# Patient Record
Sex: Female | Born: 1950 | Race: White | Hispanic: No | Smoking: Former smoker
Health system: Southern US, Community
[De-identification: ages and names within clinical notes are randomized; demographics above are authoritative.]

## PROBLEM LIST (undated history)

## (undated) DIAGNOSIS — M1990 Unspecified osteoarthritis, unspecified site: Secondary | ICD-10-CM

## (undated) DIAGNOSIS — I639 Cerebral infarction, unspecified: Secondary | ICD-10-CM

## (undated) DIAGNOSIS — J449 Chronic obstructive pulmonary disease, unspecified: Secondary | ICD-10-CM

## (undated) DIAGNOSIS — N189 Chronic kidney disease, unspecified: Secondary | ICD-10-CM

## (undated) DIAGNOSIS — G8929 Other chronic pain: Secondary | ICD-10-CM

## (undated) DIAGNOSIS — R011 Cardiac murmur, unspecified: Secondary | ICD-10-CM

## (undated) DIAGNOSIS — J189 Pneumonia, unspecified organism: Secondary | ICD-10-CM

## (undated) DIAGNOSIS — J45909 Unspecified asthma, uncomplicated: Secondary | ICD-10-CM

## (undated) DIAGNOSIS — I10 Essential (primary) hypertension: Secondary | ICD-10-CM

## (undated) DIAGNOSIS — F32A Depression, unspecified: Secondary | ICD-10-CM

## (undated) DIAGNOSIS — E079 Disorder of thyroid, unspecified: Secondary | ICD-10-CM

## (undated) DIAGNOSIS — D649 Anemia, unspecified: Secondary | ICD-10-CM

## (undated) DIAGNOSIS — G709 Myoneural disorder, unspecified: Secondary | ICD-10-CM

## (undated) DIAGNOSIS — E039 Hypothyroidism, unspecified: Secondary | ICD-10-CM

## (undated) DIAGNOSIS — M549 Dorsalgia, unspecified: Secondary | ICD-10-CM

## (undated) DIAGNOSIS — K219 Gastro-esophageal reflux disease without esophagitis: Secondary | ICD-10-CM

## (undated) HISTORY — PX: NECK SURGERY: SHX720

## (undated) HISTORY — PX: EYE SURGERY: SHX253

## (undated) HISTORY — PX: WISDOM TOOTH EXTRACTION: SHX21

## (undated) HISTORY — PX: SPINE SURGERY: SHX786

## (undated) HISTORY — PX: COLONOSCOPY: SHX174

## (undated) HISTORY — PX: ABDOMINAL HYSTERECTOMY: SHX81

## (undated) NOTE — *Deleted (*Deleted)
Recreational Therapy Discharge Summary Patient Details  Name: Jean Shaffer MRN: [id number] Date of Birth: [birth year] Today's Date: 10/29/2020  Long term goals set: 1  Long term goals met: 0  Comments on progress toward goals: Pt has made great progress during LOS.  Pt is scheduled for discharge home on 11/9 with family.  TR sessions focused on activity analysis/modifications, coping strategies and community reintegration.  Pt performed community mobility using rollator with supervision on level surfaces, however when on uneven surfaces and navigating over obstacle, pt required contact guard assist for safety.  Goal partially met. Reasons for discharge: discharge from hospital  Patient/family agrees with progress made and goals achieved: Yes  [name] 10/29/2020, 12:32 PM

---

## 1998-07-25 ENCOUNTER — Emergency Department (HOSPITAL_COMMUNITY): Admission: EM | Admit: 1998-07-25 | Discharge: 1998-07-25 | Payer: Self-pay | Admitting: *Deleted

## 1999-01-17 ENCOUNTER — Encounter: Payer: Self-pay | Admitting: Emergency Medicine

## 1999-01-17 ENCOUNTER — Emergency Department (HOSPITAL_COMMUNITY): Admission: EM | Admit: 1999-01-17 | Discharge: 1999-01-17 | Payer: Self-pay | Admitting: Emergency Medicine

## 1999-05-16 ENCOUNTER — Encounter: Admission: RE | Admit: 1999-05-16 | Discharge: 1999-05-16 | Payer: Self-pay | Admitting: Family Medicine

## 1999-06-22 ENCOUNTER — Encounter: Admission: RE | Admit: 1999-06-22 | Discharge: 1999-06-22 | Payer: Self-pay | Admitting: Sports Medicine

## 1999-07-07 ENCOUNTER — Encounter: Admission: RE | Admit: 1999-07-07 | Discharge: 1999-07-07 | Payer: Self-pay | Admitting: Family Medicine

## 1999-07-25 ENCOUNTER — Encounter: Admission: RE | Admit: 1999-07-25 | Discharge: 1999-07-25 | Payer: Self-pay | Admitting: Family Medicine

## 1999-08-22 ENCOUNTER — Encounter: Admission: RE | Admit: 1999-08-22 | Discharge: 1999-08-22 | Payer: Self-pay | Admitting: Family Medicine

## 1999-09-06 ENCOUNTER — Emergency Department (HOSPITAL_COMMUNITY): Admission: EM | Admit: 1999-09-06 | Discharge: 1999-09-06 | Payer: Self-pay | Admitting: Emergency Medicine

## 1999-09-06 ENCOUNTER — Emergency Department (HOSPITAL_COMMUNITY): Admission: EM | Admit: 1999-09-06 | Discharge: 1999-09-06 | Payer: Self-pay | Admitting: Internal Medicine

## 1999-09-07 ENCOUNTER — Encounter: Admission: RE | Admit: 1999-09-07 | Discharge: 1999-09-07 | Payer: Self-pay | Admitting: Family Medicine

## 1999-12-26 HISTORY — PX: NECK SURGERY: SHX720

## 2000-01-18 ENCOUNTER — Encounter: Admission: RE | Admit: 2000-01-18 | Discharge: 2000-01-18 | Payer: Self-pay | Admitting: Family Medicine

## 2000-02-03 ENCOUNTER — Encounter: Admission: RE | Admit: 2000-02-03 | Discharge: 2000-02-03 | Payer: Self-pay | Admitting: Family Medicine

## 2000-02-20 ENCOUNTER — Emergency Department (HOSPITAL_COMMUNITY): Admission: EM | Admit: 2000-02-20 | Discharge: 2000-02-20 | Payer: Self-pay | Admitting: Emergency Medicine

## 2000-02-22 ENCOUNTER — Encounter: Admission: RE | Admit: 2000-02-22 | Discharge: 2000-02-22 | Payer: Self-pay | Admitting: Sports Medicine

## 2000-02-22 ENCOUNTER — Encounter: Payer: Self-pay | Admitting: Sports Medicine

## 2000-02-22 ENCOUNTER — Encounter: Admission: RE | Admit: 2000-02-22 | Discharge: 2000-02-22 | Payer: Self-pay | Admitting: Family Medicine

## 2000-02-23 ENCOUNTER — Encounter: Admission: RE | Admit: 2000-02-23 | Discharge: 2000-02-23 | Payer: Self-pay | Admitting: Family Medicine

## 2000-05-14 ENCOUNTER — Emergency Department (HOSPITAL_COMMUNITY): Admission: EM | Admit: 2000-05-14 | Discharge: 2000-05-14 | Payer: Self-pay | Admitting: Emergency Medicine

## 2000-05-14 ENCOUNTER — Encounter: Payer: Self-pay | Admitting: Emergency Medicine

## 2000-06-21 ENCOUNTER — Emergency Department (HOSPITAL_COMMUNITY): Admission: EM | Admit: 2000-06-21 | Discharge: 2000-06-21 | Payer: Self-pay | Admitting: Emergency Medicine

## 2000-06-29 ENCOUNTER — Encounter: Payer: Self-pay | Admitting: Emergency Medicine

## 2000-06-29 ENCOUNTER — Ambulatory Visit (HOSPITAL_COMMUNITY): Admission: RE | Admit: 2000-06-29 | Discharge: 2000-06-29 | Payer: Self-pay | Admitting: Emergency Medicine

## 2000-08-14 ENCOUNTER — Encounter: Payer: Self-pay | Admitting: Neurosurgery

## 2000-08-17 ENCOUNTER — Inpatient Hospital Stay (HOSPITAL_COMMUNITY): Admission: RE | Admit: 2000-08-17 | Discharge: 2000-08-18 | Payer: Self-pay | Admitting: Neurosurgery

## 2000-08-17 ENCOUNTER — Encounter: Payer: Self-pay | Admitting: Neurosurgery

## 2000-09-07 ENCOUNTER — Encounter: Payer: Self-pay | Admitting: Neurosurgery

## 2000-09-07 ENCOUNTER — Encounter: Admission: RE | Admit: 2000-09-07 | Discharge: 2000-09-07 | Payer: Self-pay | Admitting: Neurosurgery

## 2000-10-10 ENCOUNTER — Encounter: Admission: RE | Admit: 2000-10-10 | Discharge: 2000-10-10 | Payer: Self-pay | Admitting: Neurosurgery

## 2000-10-10 ENCOUNTER — Encounter: Payer: Self-pay | Admitting: Neurosurgery

## 2000-12-25 HISTORY — PX: MULTIPLE TOOTH EXTRACTIONS: SHX2053

## 2001-01-22 ENCOUNTER — Encounter: Payer: Self-pay | Admitting: Neurosurgery

## 2001-01-22 ENCOUNTER — Encounter: Admission: RE | Admit: 2001-01-22 | Discharge: 2001-01-22 | Payer: Self-pay | Admitting: Neurosurgery

## 2001-06-07 ENCOUNTER — Emergency Department (HOSPITAL_COMMUNITY): Admission: EM | Admit: 2001-06-07 | Discharge: 2001-06-07 | Payer: Self-pay | Admitting: Emergency Medicine

## 2002-05-08 ENCOUNTER — Encounter: Payer: Self-pay | Admitting: Emergency Medicine

## 2002-05-08 ENCOUNTER — Inpatient Hospital Stay (HOSPITAL_COMMUNITY): Admission: EM | Admit: 2002-05-08 | Discharge: 2002-05-12 | Payer: Self-pay | Admitting: Emergency Medicine

## 2004-02-27 ENCOUNTER — Emergency Department (HOSPITAL_COMMUNITY): Admission: EM | Admit: 2004-02-27 | Discharge: 2004-02-27 | Payer: Self-pay | Admitting: Emergency Medicine

## 2005-06-06 ENCOUNTER — Emergency Department (HOSPITAL_COMMUNITY): Admission: EM | Admit: 2005-06-06 | Discharge: 2005-06-06 | Payer: Self-pay | Admitting: Emergency Medicine

## 2005-06-09 ENCOUNTER — Encounter: Admission: RE | Admit: 2005-06-09 | Discharge: 2005-06-09 | Payer: Self-pay | Admitting: Family Medicine

## 2007-04-22 ENCOUNTER — Encounter: Payer: Self-pay | Admitting: Physical Medicine and Rehabilitation

## 2007-04-25 ENCOUNTER — Encounter: Payer: Self-pay | Admitting: Physical Medicine and Rehabilitation

## 2007-05-26 ENCOUNTER — Encounter: Payer: Self-pay | Admitting: Physical Medicine and Rehabilitation

## 2007-06-12 ENCOUNTER — Ambulatory Visit: Payer: Self-pay | Admitting: Emergency Medicine

## 2007-07-02 ENCOUNTER — Encounter
Admission: RE | Admit: 2007-07-02 | Discharge: 2007-07-02 | Payer: Self-pay | Admitting: Physical Medicine and Rehabilitation

## 2007-11-13 ENCOUNTER — Inpatient Hospital Stay (HOSPITAL_COMMUNITY): Admission: RE | Admit: 2007-11-13 | Discharge: 2007-11-18 | Payer: Self-pay | Admitting: Neurosurgery

## 2007-11-13 HISTORY — PX: SPINE SURGERY: SHX786

## 2007-11-19 ENCOUNTER — Emergency Department: Payer: Self-pay | Admitting: Emergency Medicine

## 2008-03-09 ENCOUNTER — Encounter: Admission: RE | Admit: 2008-03-09 | Discharge: 2008-03-09 | Payer: Self-pay | Admitting: Neurosurgery

## 2008-09-28 ENCOUNTER — Encounter: Admission: RE | Admit: 2008-09-28 | Discharge: 2008-09-28 | Payer: Self-pay | Admitting: Neurosurgery

## 2010-05-08 ENCOUNTER — Emergency Department: Payer: Self-pay | Admitting: Unknown Physician Specialty

## 2010-08-23 ENCOUNTER — Ambulatory Visit: Payer: Self-pay | Admitting: Cardiology

## 2010-08-23 ENCOUNTER — Observation Stay (HOSPITAL_COMMUNITY): Admission: EM | Admit: 2010-08-23 | Discharge: 2010-08-23 | Payer: Self-pay | Admitting: Emergency Medicine

## 2010-11-08 DIAGNOSIS — M1990 Unspecified osteoarthritis, unspecified site: Secondary | ICD-10-CM | POA: Insufficient documentation

## 2010-11-09 DIAGNOSIS — G609 Hereditary and idiopathic neuropathy, unspecified: Secondary | ICD-10-CM | POA: Insufficient documentation

## 2010-11-09 DIAGNOSIS — F331 Major depressive disorder, recurrent, moderate: Secondary | ICD-10-CM | POA: Insufficient documentation

## 2011-01-13 ENCOUNTER — Encounter
Admission: RE | Admit: 2011-01-13 | Discharge: 2011-01-13 | Payer: Self-pay | Source: Home / Self Care | Attending: Family Medicine | Admitting: Family Medicine

## 2011-03-08 ENCOUNTER — Other Ambulatory Visit: Payer: Self-pay | Admitting: Gastroenterology

## 2011-03-08 ENCOUNTER — Ambulatory Visit (HOSPITAL_COMMUNITY)
Admission: RE | Admit: 2011-03-08 | Discharge: 2011-03-08 | Disposition: A | Discharge status: Home / Self Care | Payer: Medicaid Other | Source: Ambulatory Visit | Attending: Gastroenterology | Admitting: Gastroenterology

## 2011-03-08 DIAGNOSIS — D126 Benign neoplasm of colon, unspecified: Secondary | ICD-10-CM | POA: Insufficient documentation

## 2011-03-08 DIAGNOSIS — J4489 Other specified chronic obstructive pulmonary disease: Secondary | ICD-10-CM | POA: Insufficient documentation

## 2011-03-08 DIAGNOSIS — J449 Chronic obstructive pulmonary disease, unspecified: Secondary | ICD-10-CM | POA: Insufficient documentation

## 2011-03-08 DIAGNOSIS — G8929 Other chronic pain: Secondary | ICD-10-CM | POA: Insufficient documentation

## 2011-03-08 DIAGNOSIS — K6389 Other specified diseases of intestine: Secondary | ICD-10-CM | POA: Insufficient documentation

## 2011-03-08 DIAGNOSIS — G5790 Unspecified mononeuropathy of unspecified lower limb: Secondary | ICD-10-CM | POA: Insufficient documentation

## 2011-03-08 DIAGNOSIS — I10 Essential (primary) hypertension: Secondary | ICD-10-CM | POA: Insufficient documentation

## 2011-03-08 DIAGNOSIS — M545 Low back pain, unspecified: Secondary | ICD-10-CM | POA: Insufficient documentation

## 2011-03-08 DIAGNOSIS — K5900 Constipation, unspecified: Secondary | ICD-10-CM | POA: Insufficient documentation

## 2011-03-08 DIAGNOSIS — M129 Arthropathy, unspecified: Secondary | ICD-10-CM | POA: Insufficient documentation

## 2011-03-08 DIAGNOSIS — K219 Gastro-esophageal reflux disease without esophagitis: Secondary | ICD-10-CM | POA: Insufficient documentation

## 2011-03-08 DIAGNOSIS — K644 Residual hemorrhoidal skin tags: Secondary | ICD-10-CM | POA: Insufficient documentation

## 2011-03-08 DIAGNOSIS — Z1211 Encounter for screening for malignant neoplasm of colon: Secondary | ICD-10-CM | POA: Insufficient documentation

## 2011-03-10 LAB — D-DIMER, QUANTITATIVE (NOT AT ARMC): D-Dimer, Quant: 0.22 ug/mL-FEU (ref 0.00–0.48)

## 2011-03-10 LAB — PROTIME-INR
INR: 0.88 (ref 0.00–1.49)
Prothrombin Time: 12.1 seconds (ref 11.6–15.2)

## 2011-03-10 LAB — DIFFERENTIAL
Basophils Absolute: 0 10*3/uL (ref 0.0–0.1)
Basophils Relative: 0 % (ref 0–1)
Eosinophils Absolute: 0.2 10*3/uL (ref 0.0–0.7)
Eosinophils Relative: 2 % (ref 0–5)
Lymphocytes Relative: 38 % (ref 12–46)
Lymphs Abs: 4 10*3/uL (ref 0.7–4.0)
Monocytes Absolute: 0.9 10*3/uL (ref 0.1–1.0)
Monocytes Relative: 9 % (ref 3–12)
Neutro Abs: 5.4 10*3/uL (ref 1.7–7.7)
Neutrophils Relative %: 51 % (ref 43–77)

## 2011-03-10 LAB — BASIC METABOLIC PANEL
BUN: 11 mg/dL (ref 6–23)
CO2: 30 mEq/L (ref 19–32)
Calcium: 9.2 mg/dL (ref 8.4–10.5)
Chloride: 106 mEq/L (ref 96–112)
Creatinine, Ser: 0.98 mg/dL (ref 0.4–1.2)
GFR calc Af Amer: >60 mL/min (ref >60)
GFR calc non Af Amer: 58 mL/min — ABNORMAL LOW (ref >60)
Glucose, Bld: 95 mg/dL (ref 70–99)
Potassium: 3.6 mEq/L (ref 3.5–5.1)
Sodium: 139 mEq/L (ref 135–145)

## 2011-03-10 LAB — POCT CARDIAC MARKERS
CKMB, poc: <1 ng/mL — ABNORMAL LOW (ref 1.0–8.0)
Myoglobin, poc: 62.8 ng/mL (ref 12–200)
Troponin i, poc: <0.05 ng/mL (ref 0.00–0.09)

## 2011-03-10 LAB — CBC
HCT: 36.9 % (ref 36.0–46.0)
Hemoglobin: 12.3 g/dL (ref 12.0–15.0)
MCH: 29.4 pg (ref 26.0–34.0)
MCHC: 33.3 g/dL (ref 30.0–36.0)
MCV: 88.1 fL (ref 78.0–100.0)
Platelets: 254 10*3/uL (ref 150–400)
RBC: 4.19 MIL/uL (ref 3.87–5.11)
RDW: 13.3 % (ref 11.5–15.5)
WBC: 10.6 10*3/uL — ABNORMAL HIGH (ref 4.0–10.5)

## 2011-03-10 LAB — APTT: aPTT: 32 seconds (ref 24–37)

## 2011-03-10 LAB — CK TOTAL AND CKMB (NOT AT ARMC)
CK, MB: 1.1 ng/mL (ref 0.3–4.0)
CK, MB: 1.2 ng/mL (ref 0.3–4.0)
CK, MB: 1.4 ng/mL (ref 0.3–4.0)
Relative Index: INVALID (ref 0.0–2.5)
Relative Index: INVALID (ref 0.0–2.5)
Relative Index: INVALID (ref 0.0–2.5)
Total CK: 58 U/L (ref 7–177)
Total CK: 63 U/L (ref 7–177)
Total CK: 69 U/L (ref 7–177)

## 2011-03-10 LAB — TROPONIN I
Troponin I: 0.01 ng/mL (ref 0.00–0.06)
Troponin I: 0.02 ng/mL (ref 0.00–0.06)
Troponin I: 0.02 ng/mL (ref 0.00–0.06)

## 2011-03-10 LAB — LIPID PANEL
Cholesterol: 208 mg/dL — ABNORMAL HIGH (ref 0–200)
HDL: 46 mg/dL (ref >39)
LDL Cholesterol: 125 mg/dL — ABNORMAL HIGH (ref 0–99)
Total CHOL/HDL Ratio: 4.5 RATIO
Triglycerides: 187 mg/dL — ABNORMAL HIGH (ref <150)
VLDL: 37 mg/dL (ref 0–40)

## 2011-05-09 NOTE — Op Note (Signed)
Jean Shaffer, FUHRER              ACCOUNT NO.:  [id number]   MEDICAL RECORD NO.:  [id number]          PATIENT TYPE:  INP   LOCATION:  3172                         FACILITY:  [facility]   PHYSICIAN:  Donalee Citrin, M.D.        DATE OF BIRTH:  July 01, 1951   DATE OF PROCEDURE:  11/13/2007  DATE OF DISCHARGE:                               OPERATIVE REPORT   PREOPERATIVE DIAGNOSIS:  Degenerative disc disease L4-L5 and L5-S1 with  diskogenic mechanical low back pain and lumbar radiculopathy L5 and S1,  left greater than right.   POSTOPERATIVE DIAGNOSIS:  Degenerative disc disease L4-L5 and L5-S1 with  diskogenic mechanical low back pain and lumbar radiculopathy L5-S1, left  greater than right.   PROCEDURE:  Posterior lumbar fusion L4-L5 and L5-S1 using a Hybrid  Telamon PEEK 8 x 22 cage at L5-S1 and 10 x 22 at L4-L5 mixed with  Tangent allograft wedges at each level respectively, 8 x 26 mm and 10 x  26 mm pedicle screw fixation L4 to S1 using 6.35 Legacy pedicle screw  system, posterior arthrodesis L4 to S1 using locally harvested autograft  mixed with bone substitute, and placement of medium Hemovac.   SURGEON:  Donalee Citrin, M.D.   ASSISTANT:  Tia Alert, M.D.   ANESTHESIA:  General endotracheal anesthesia.   HISTORY OF PRESENT ILLNESS:  The patient is a very pleasant 60 year old  female who has had long standing back and left greater than right leg  pain that has been going on for several years now.  The patient has been  in and out of pain management, underwent multiple injections, with facet  blocks, and epidural steroid injections with limited success. MRI scan  showed annular tearing and degeneration at L4-L5 and L5-S1 was central  disc bulges.  Subsequent diskography confirmed strongly concordant  responses at L4-L5 and L5-S1 with a normal control at L3-L4.  Due to the  patient's failure with conservative treatment, progression of symptoms,  and MRI CT diskography findings, the  patient was recommended two level  interbody fusion.  The risks and benefits of the operation were  explained to the patient, she understood and agreed to proceed forth.   DESCRIPTION OF PROCEDURE:  The patient was brought to the OR, was  induced under general anesthesia, her back was prepped draped in routine  sterile fashion.  Preoperative x-ray localized the L5 spinous process  and after infiltration of lidocaine with epinephrine, a midline incision  was made, Bovie electrocautery was used to take down the subcutaneous  tissue and subperiosteal dissection carried out at the lamina of L4, L5,  and S1 bilaterally exposing the pedicles at L4, L5 and S1 bilaterally.  After intraoperative x-ray confirmed localization of the appropriate  level, the spinous processes at L4 and L5 were completely removed.  Central decompression was then completed and radical medial  facetectomies were performed at both L4-L5 and L5-S1 with radical  foraminotomies of the L4 and L5 roots respectively.  After the L4 and L5  roots had been completely unroofed and prior to that actually what was  noted  was marked facet arthropathy with displacement of the inferior  aspect of the L4 root and medial aspect of the L5 root at L4-L5 and this  was all underbitten decompressing the root and gaining access to the  lateral disc space. After decompression had been completed, attention  was turned first to pedicle screw placement. Using a high speed drill,  the pilot hole was drilled at L4 on the left, cannulated with the awl,  probed again, tapped with a 5.5 tap, probed again and 6.5 by 45 screws  inserted at L4 on the right. From probing within the canal as well as  within the pedicle and using bony landmarks, this confirmed no  mediolateral breach.  Fluoroscopy was used each step along the way  confirming trajectory. The L5 and S1 screws were inserted on the right  in a similar fashion with 6 by 45 at L4 and L5 and 6 by 35  at S1. On the  left side, subsequent three screws inserted in a similar fashion, again,  the pedicle was probed from within the pedicle as well as the canal  confirming no mediolateral breach and all screws had excellent purchase.  After all six screws had been placed, attention was turned to the  interbody work. First the L4 nerve root was reflected medially, the disc  space was incised, a large central disc fragment was removed from the  central compartment decompressing the central canal. The disc space was  cleaned out. A size 10 distractor was inserted.  This had good  opposition of the endplates and felt to be the appropriate size of the  grafts.  Then, at L5-S1 in similar fashion, this was cleaned out.  This  was a markedly degenerated collapse only taking an 8 distractor which  again noted good opposition of the endplates and felt to be the  appropriate size for spacers. Then, working on the right side, the right  L5 nerve root was reflected medially and the disc space was cleaned out.  Several additional fragments were removed from the central compartment  using a size 10 cutter and chisel.  The endplates were prepped to  receive the cage. Fluoroscopy was used each step along the way. After  adequate decompression and discectomy had been completed, a 10 x 22  Telamon PEEK cage packed with locally harvested autograft mixed with  Vertex bone substitute was inserted on the right side at L4-L5.  Then,  on the right side at L5-S1, in a similar fashion, the disc space was  cleaned out, large posterior osteophytes were bitten off, and a cutter  used to prepare the endplates and a size 8 x 26 Tangent allograft wedge  was inserted on the right side at L5-S1.  Then, both distractors were  removed and, in a similar fashion, the interspaces were cleaned out.  Locally harvested autograft was packed centrally at L4-L5 and L5-S1.  Then, the left sided Tangent was inserted at L5 and the left  side  Telamon inserted at L5-S1. After all the interbody work had been done,  the wound was copiously irrigated.  Hemostasis was maintained.  Aggressive decortication was carried in the lateral gutters.  The  remainder of the autograft was then packed in the lateral gutters from  L4 to S1.  Then, a 70 mm rod was sized, selected, and inserted at S1,  the L5 pedicle screw was compressed against S1, and the L4 compressed  against L5, and a crosslink was inserted. All neural  foramina were then  reinspected confirmed no migration of graft material and confirmed  patency. Meticulous hemostasis subsequently maintained.  Gelfoam was  laid on top of the dura.  The wound was closed in layers with  interrupted Vicryl with a layer of 3-0 Vicryl underneath the dermis and  Dermabond, Benzoin, and Steri-Strips.  The patient went to the recovery  room in stable condition.  At the end of the case, needle, sponge, and  instrument counts were correct.           ______________________________  Donalee Citrin, M.D.     GC/MEDQ  D:  11/13/2007  T:  11/13/2007  Job:  [id number]

## 2011-05-12 NOTE — Discharge Summary (Signed)
[place]. [facility]  Patient:    Jean Shaffer, TRIVETT Visit Number: [id number] MRN: [id number]          Service Type: MED Location: [phone] Attending Physician:  Runell Gess Md Dictated by:   Marya Fossa, P.A. Admit Date:  05/10/2002 Disc. Date: 05/09/02   CC:         Dr. Pricilla Holm, M.D.                           Discharge Summary  DATE OF BIRTH:  Apr 19, 1951  ADMISSION DIAGNOSES: 1. Chest pain, rule out myocardial infarction. 2. Elevated blood pressure. 3. Tobacco use.  DISCHARGE DIAGNOSES: 1. Chest pain, resolved.  Myocardial infarction ruled out with negative    enzymes.  Cardiac versus gastrointestinal etiology.  Outpatient workup. 2. Hypertension, controlled. 3. Tobacco, counseled on cessation. 4. Favorable lipid profile.  HISTORY OF PRESENT ILLNESS:  This 60 year old divorced white female without a primary care [name] and no cardiac history.  Yesterday she felt dizzy, saw her pheziatrist, Dr. Murray Hodgkins, who found her blood pressure to be 160/100.  He advised her to go to Urgent Care.  Repeat blood pressure there was the same and she had an EKG which was told to benormal.  However, she was told she was at risk for heart disease and wastold to call a cardiologist for a follow-up appointment.  This morning, upon awakening she had a headache and left jaw pain.  Blood pressure was again 160/100.  She took a Valium and Darvocet with no change in her symptoms.  She developed chest pressure rated as 5-6/10, continued jaw pain, left arm pain, and nausea.  Her son advised her to go to the emergency room.  Upon arrival, her systolic pressure was in the 180s.  She continued to have 5/6 chest pain.  She was given a total of three nitroglycerin with minimal relief after the third and then a GI cocktail with complete relief x1 hour.  However, the symptoms are not returning.  Her chest pressure is currently  about a 5/10.  She is also experiencing palpitations. Shehas never felt this way before.  PROCEDURE:  None.  CONSULTING PHYSICIANS:  None.  COMPLICATIONS:  None.  HOSPITAL COURSE:  Ms. Skoda was admitted to [facility] on May 15,2003, for observation to rule out MI on telemetry.  She was placed on IVheparin per pharmacy protocol and IV nitroglycerin at 3.  Chest pain completely resolved after another GI cocktail was given.  Admitting labsshowed a hemoglobin of 12.8, potassium of 3.8, BUN 11, creatinine 0.9.  Blood glucose elevated at 117.  First enzymes negative. Slightly elevated SGPT at 61, normal SGOT and alkaline phosphatase.  The patient wasalso placed on aspirin and beta blocker.  She was kept NPO in case of cardiac catheterization being warranted.  On May 09, 2002, the patient continued to feel fine and had no recurrence of chest pain.  She was hemodynamically stable with improved blood pressure at 110/58 and pulse of 70. Hemoglobin 11.7, potassium 3.7, BUN 10, creatinine 0.9, and glucose 89. Cardiac enzymes were negative x3.  Total cholesterol 188, triglycerides 144, HDL 60, and LDL 99.  It was felt the patient was stable for discharge to home.  She will need an outpatient cardiac workup given her risk factors.  The office will call  her to schedule an appointment for an exercise stress test and a follow-up appointment with Dr. Jenne Campus in the [place] office.  DISCHARGE MEDICATIONS: 1. Aspirin 81 mg a day. 2. Protonix 40 mg a day for two weeks. 3. Toprol XL 25 mg a day. 4. Nitroglycerin as needed for chest pain. 5. Valium 5 mg a day. 6. Darvocet as needed as directed.  She is to stop smoking.  DIET:  Low salt, low fat, low cholesterol diet.  DISCHARGE INSTRUCTIONS: She is asked to call the office for any problems or questions.  Office will call her to schedule a stress test and a follow-up appointment with Dr. Jenne Campus within the next two  weeks.  If there are any problems or questions in the interim, she is to call. Dictated by:   Marya Fossa, P.A. Attending Physician:  Runell Gess Md DD:  05/09/02 TD:  05/12/02 Job: [id number]
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
## 2011-05-12 NOTE — Cardiovascular Report (Signed)
[place]. [facility]  Patient:    Jean Shaffer, Jean Shaffer Visit Number: [id number] MRN: [id number]          Service Type: MED Attending Physician:  Berry, Jonathan [place] Dictated by:   Madaline Savage, M.D. Proc. Date: 05/12/02 Admit Date:  05/10/2002 Discharge Date: 05/12/2002   CC:         Runell Gess, M.D.  Cardiac Catheterization Laboratory   Cardiac Catheterization  PROCEDURES PERFORMED: 1. Selective coronary angiography by Judkins technique. 2. Retrograde left heart catheterization. 3. Left ventricular angiography.  COMPLICATIONS: None.  ENTRY SITE: Right femoral.  DYE USED: Omnipaque.  PATIENT PROFILE: The patient is a 60 year old white female, who entered the hospital on May 08, 2002, with chest pain. She had subsequently negative cardiac enzymes and an ECG that was normal. The patient has a history of anxiety and it was deemed wise to proceed with cardiac catheterization that was performed today electively on an inpatient basis without complications.  RESULTS:  PRESSURES: The left ventricular pressure was 150/11, end-diastolic pressure 23, central aortic pressure 145/70, mean of 100.  No aortic valve gradient by pullback technique.  ANGIOGRAPHIC RESULTS: The left main coronary artery was normal.  The LAD was normal. A large intermediate ramus branch was normal.  The left circumflex was nondominant and normal.  The right coronary artery was large and dominant and normal.  The left ventricular angiogram performed in a 30-degree RAO projection showed normal left ventricular ejection fraction of 60-65% and no evidence of mitral regurgitation or LV thrombus. No peripheral angiography was done.  FINAL DIAGNOSES: 1. Angiographically patent coronary arteries. 2. Normal left ventricular systolic function.  PLAN: Reassurance and outpatient followup of patient and chest pain complaints. Dictated by:   Madaline Savage,  M.D. Attending Physician:  Berry, Jonathan [place] DD:  05/12/02 TD:  05/13/02 Job: [id number]

## 2011-05-12 NOTE — Discharge Summary (Signed)
[place]. [facility]  Patient:    Jean Shaffer, Jean Shaffer Visit Number: [id number] MRN: [id number]          Service Type: MED Attending Physician:  Berry, Jonathan [place] Dictated by:   Inda Castle Admit Date:  05/10/2002 Discharge Date: 05/12/2002                             Discharge Summary  HISTORY OF PRESENT ILLNESS:  [name] is a 60 year old divorced white female patient without a primary care doctor, that went to our urgent care after being seen by our psychiatrist, Dr. _____.  Her blood pressure was elevated at 150/100.  She apparently upon awakening on the day of admission had a headache and left jaw pain and blood pressure was 150/100 again.  She took a Valium and a Darvocet-N 100.  She had no change in her symptoms.  She then developed chest pressure.  Repeat blood pressure was more elevated; thus, her son brought her to the emergency room.  Her chest pain was a 5-6/10 with jaw pain, left arm pain, and nausea.  She described it as a heavy, sharp, shooting pain with palpitations.  She had had three nitroglycerins in the ER with some relief.  She then had a GI cocktail with complete relief.  HOSPITAL COURSE:  It was decided to keep her overnight to rule out for an MI and for further cardiac evaluation as an outpatient.  Her CK-MBs came back negative.  She had been put on IV heparin.  It was decided to discontinue the IV heparin and ambulate her in the hall, which she did; however, she had recurrent pain off her heparin and nitroglycerin upon walking.  Thus, she was restarted on heparin and nitroglycerin with the decision that she should undergo cardiac catheterization.  At her time of admission she had been put on Toprol 25 mg, Altace was also added to control her blood pressure, and Altace was increased.  On May 12, 2002, she underwent cardiac catheterization that was uncomplicated.  She had normal coronaries and normal LV function at 60%. She will  be later discharged home pending her ambulation and blood pressure reading post catheterization.  DISCHARGE MEDICATIONS: 1. Altace 5 mg once per day. 2. Protonix 40 mg once per day x1 month, and then she may discontinue.  DISCHARGE INSTRUCTIONS:  She should do no strenuous activity, no lifting, no driving, no sexual activity x4 days.  She should be on a reduced saturated fat diet.  If she has any problems with her groin, she will give [place] a call.  She will follow up with Marya Fossa, P.A., on May 30 at 9:40 a.m. in our office to check her groin.  She was also told that she will need to get a primary care physician since she is now on a blood pressure medication.  DISCHARGE DIAGNOSES: 1. Chest pain, nonischemic, with normal coronary arteries on cardiac    catheterization. 2. Hypertension, controlled on medications.  We will just keep her on Altace    5 mg a day, which may need to be titrated as an outpatient.  We will    discontinue the Toprol XL. 3. Questionable gastritis.  We will keep her on Protonix x1 month, and then    she will discontinue this, treating empirically for possible    gastrointestinal etiology of her chest pain.  LABORATORY DATA:  May 11, 2002, hemoglobin was 11.4, hematocrit  was 32.8, WBC 9.3, platelets 256.  CK-MBs were negative x3.  Total cholesterol 188, triglycerides 144, HDL was 60, LDL was 99.  Sodium 142, potassium 3.7, glucose 89, and BUN was 10, creatinine was 0.9.  Troponins were negative x3.  TSH was 4.760.  AST was 34, ALT was 61. Dictated by:   ra Attending Physician:  Berry, Jonathan [place] DD:  05/12/02 TD:  05/14/02 Job: [id number]
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
## 2011-05-12 NOTE — Consult Note (Signed)
NAMEKERAH, HARDEBECK              ACCOUNT NO.:  [id number]   MEDICAL RECORD NO.:  [id number]          PATIENT TYPE:  EMS   LOCATION:  MAJO                         FACILITY:  [facility]   PHYSICIAN:  Gustavus Messing. Orlin Hilding, M.D.DATE OF BIRTH:  06-15-1951   DATE OF CONSULTATION:  06/06/2005  DATE OF DISCHARGE:                                   CONSULTATION   CHIEF COMPLAINT:  Pressure in head.   HISTORY OF PRESENT ILLNESS:  This was called as a code stroke at  approximately 8 p.m. with onset of symptoms at approximately 4 p.m. her  chief complaint was pressure in head. She was at work, said she had onset of  a sensation of her ear being stuffed up on the right, followed by some  numbness in the right face and pressure in her head. When she was evaluated  in the emergency room she was thought to have some left-sided weakness. She  does not volunteer this, but when questioned later says that she thinks her  language is not quite right at all.   REVIEW OF SYSTEMS:  Positive for chronic back pain.   PAST MEDICAL HISTORY:  Significant for fairly recently diagnosed  hypertension, chronic low back pain with degenerative disk disease, status  post anterior cervical diskectomy.   MEDICATIONS:  Atenolol, Altace, hydrochlorothiazide, Darvocet, Neurontin,  Lidoderm patch, takes some of sleeping pill that she cannot remember the  name, and Flexeril.   ALLERGIES:  No known drug allergies.   SOCIAL HISTORY:  She is employed. She is single with two sons. She does not  smoke. Drinks occasional alcohol.   FAMILY HISTORY:  Positive for cancer.   PHYSICAL EXAMINATION:  VITAL SIGNS: Temperature 98.3, pulse 69, respirations  22, BP 192/81, 98% saturation.  HEENT: Normocephalic and atraumatic.  NECK: Supple without bruits.  HEART: Regular rate and rhythm.  EXTREMITIES: Without edema.  LUNGS: Clear to auscultation.  ABDOMEN: Soft with positive bowel sounds.  NEUROLOGIC: Mental status--she is awake and  alert. She has a somewhat forced  dysphonic speech which is functional. She is able to name, repeat. There are  no comprehension errors. On cranial nerves, her pupils are equal and  reactive. Visual fields are full. Extraocular movements are intact. Facial  sensation is normal. Facial motor activity is largely normal. There may be a  very subtle left facial nasolabial fold flattening. This is variable,  however. Hearing is intact. Palate is symmetric and tongue is midline. There  is no dysarthria. On motor exam, she is very inconsistent. She appears to  have a drift or decreased effort in the left upper extremity and left  greater than right lower extremities, but it is inconsistent. She does fling  her arms and legs around on finger-to-nose and heel-to-shin, but there is no  dysmetria. She is able to do this on both sides despite what looks like an  instant drift when testing for drift.  Sensory exam is intact without  extinction. There is no neglect and no dysarthria.  On the formal NIH stroke  scale her score would be a 6, although I have  a strong index of suspicion  for some hysterical features.   CT of the brain is essentially negative.   Labs are unremarkable except for a slightly elevated white blood cell count  at 12.3.   IMPRESSION:  Variable neurologic symptoms with more or less left body  weakness, but there are some hysterical features. She certainly could have a  stroke, but she is not a TPA candidate due to the time window. She could be  a study candidate, but with my concerns about possible hysterical features I  would like to make sure she in fact has a stroke before considering her  enrolling.   PLAN:  I will check an MRI of the brain and if it is abnormal will evaluate  for a stroke study protocol and admit her for stroke workup. If the MRA is  negative, however, I will defer to the emergency room physician for the  disposition.       CAW/MEDQ  D:  06/06/2005   T:  06/06/2005  Job:  [id number]

## 2011-05-12 NOTE — Discharge Summary (Signed)
NAMEMAHASIN, RIVIERE              ACCOUNT NO.:  [id number]   MEDICAL RECORD NO.:  [id number]          PATIENT TYPE:  INP   LOCATION:  3008                         FACILITY:  [facility]   PHYSICIAN:  Donalee Citrin, M.D.        DATE OF BIRTH:  11-01-1951   DATE OF ADMISSION:  11/13/2007  DATE OF DISCHARGE:  11/18/2007                               DISCHARGE SUMMARY   ADMISSION DIAGNOSES:  Degenerative disk disease and lumbar spinal  stenosis, L4-5 and L5-S1.   PROCEDURE:  Decompressive laminectomy and fusion L4-5 and L5-S1.   HOSPITAL COURSE:  The patient is a 60 year old female who was admitted  __________ . Postoperatively she did very well. On the floor, patient  was ambulating and voiding spontaneously. Pain was significantly  improved in her legs and back and became under pretty good control over  the next 24 to 48 hours. Foley was able to be taken out. Hemovac was  taken out. PCA was discontinued. The patient was stable and discharged  home. At the time of discharge, patient was ambulating and voiding  spontaneously. Pain well controlled on pills.   DISPOSITION:  Discharge to home.   FOLLOWUP:  In 2 weeks.           ______________________________  Donalee Citrin, M.D.     GC/MEDQ  D:  01/08/2008  T:  01/08/2008  Job:  [id number]

## 2011-05-12 NOTE — Consult Note (Signed)
NAMEMARRISA, KIMBER              ACCOUNT NO.:  [id number]   MEDICAL RECORD NO.:  [id number]          PATIENT TYPE:  EMS   LOCATION:  MAJO                         FACILITY:  [facility]   PHYSICIAN:  Gustavus Messing. Orlin Hilding, M.D.DATE OF BIRTH:  1951-06-28   DATE OF CONSULTATION:  06/06/2005  DATE OF DISCHARGE:                                   CONSULTATION   REASON FOR CONSULTATION:  Ms. Horn symptoms were fluctuating and  improved. MRI was done due to the confusing nature of the patient's exam  with question of hysterical features. The MRI diffusion-weighted images were  negative with no evidence of any acute stroke. The FLAIR images show some  very minor subcortical white mater lesion in both hemispheres in the peri-  insular region of uncertain clinical significance. In the MRA, intracranial  suggest that there may be some proximal anterior cerebral stenosis on the  right, A1 segment stenosis, which could be a normal congenital variation.  There is good flow both anterior cerebral vessels. 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 These findings are minimal, not  necessarily clinically significant, and not applicable to the patient's  presentation. It is recommended that she follow up with her primary care  physician for further direction, whether she needs further outpatient  neurological workup will be left to the discretion of her primary care  physician.       CAW/MEDQ  D:  06/06/2005  T:  06/06/2005  Job:  [id number]
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
## 2011-05-12 NOTE — Discharge Summary (Signed)
NAMEAUNESTY, TYSON              ACCOUNT NO.:  [id number]   MEDICAL RECORD NO.:  [id number]          PATIENT TYPE:  INP   LOCATION:  3008                         FACILITY:  [facility]   PHYSICIAN:  Donalee Citrin, M.D.        DATE OF BIRTH:  11-Apr-1951   DATE OF ADMISSION:  11/13/2007  DATE OF DISCHARGE:  11/18/2007                               DISCHARGE SUMMARY   ADMISSION DIAGNOSIS:  Degenerative disk disease, L4-5, L5-S1.   PROCEDURES:  Postero-lumbar interbody fusion L4-5 and L5-S1.   HISTORY OF PRESENT ILLNESS:  The patient is a very pleasant 60 year old  female who was admitted and went to the operating room for the above  mentioned procedure.  Postop, the patient did very well with  recovery  on the  floor.  On the floor, the patient was convalescing well.  Pain  was significantly improved in her legs.  The patient was having  significant  back pain.  This is controlled on an IV PCA.  The patient  was mobilized with physical and occupational therapy.  By hospital day  1, her Hemovac and Foley were removed.  She was on __________ .  PCA was  discontinued by day 2.  The patient continued to have a lot of pain  medication issues, as well as a little bit of a postop ileus, difficulty  with her bowels moving; however, this resolved over the next couple of  days.  By the time the patient was stable for discharge home, she was  ambulating and voiding spontaneously.  Wound was clean and dry and she  was setup in followup in 2 weeks.           ______________________________  Donalee Citrin, M.D.     GC/MEDQ  D:  01/15/2008  T:  01/15/2008  Job:  [id number]

## 2011-10-03 LAB — URINALYSIS, ROUTINE W REFLEX MICROSCOPIC
Bilirubin Urine: NEGATIVE
Bilirubin Urine: NEGATIVE
Glucose, UA: NEGATIVE
Glucose, UA: NEGATIVE
Hgb urine dipstick: NEGATIVE
Hgb urine dipstick: NEGATIVE
Ketones, ur: NEGATIVE
Ketones, ur: NEGATIVE
Nitrite: NEGATIVE
Nitrite: NEGATIVE
Protein, ur: NEGATIVE
Protein, ur: NEGATIVE
Specific Gravity, Urine: 1.01
Specific Gravity, Urine: 1.014
Urobilinogen, UA: 0.2
Urobilinogen, UA: 4 — ABNORMAL HIGH
pH: 5.5
pH: 6

## 2011-10-03 LAB — URINE CULTURE
Colony Count: NO GROWTH
Culture: NO GROWTH
Special Requests: NEGATIVE
Special Requests: NEGATIVE

## 2011-10-03 LAB — BASIC METABOLIC PANEL
BUN: 10
CO2: 25
Calcium: 9.3
Chloride: 106
Creatinine, Ser: 0.75
GFR calc Af Amer: >60
GFR calc non Af Amer: >60
Glucose, Bld: 89
Potassium: 3.8
Sodium: 137

## 2011-10-03 LAB — CBC
HCT: 41.4
Hemoglobin: 14.1
MCHC: 33.9
MCV: 86.9
Platelets: 330
RBC: 4.77
RDW: 13.5
WBC: 12.1 — ABNORMAL HIGH

## 2011-10-03 LAB — TYPE AND SCREEN
ABO/RH(D): O NEG
Antibody Screen: NEGATIVE

## 2011-10-03 LAB — URINE MICROSCOPIC-ADD ON

## 2011-10-03 LAB — CULTURE, BLOOD (ROUTINE X 2): Culture: NO GROWTH

## 2011-10-03 LAB — ABO/RH: ABO/RH(D): O NEG

## 2012-04-16 DIAGNOSIS — K219 Gastro-esophageal reflux disease without esophagitis: Secondary | ICD-10-CM | POA: Insufficient documentation

## 2012-04-19 DIAGNOSIS — E785 Hyperlipidemia, unspecified: Secondary | ICD-10-CM | POA: Insufficient documentation

## 2013-02-26 ENCOUNTER — Other Ambulatory Visit: Payer: Self-pay | Admitting: Family Medicine

## 2013-02-26 DIAGNOSIS — Z1231 Encounter for screening mammogram for malignant neoplasm of breast: Secondary | ICD-10-CM

## 2013-02-26 DIAGNOSIS — Z780 Asymptomatic menopausal state: Secondary | ICD-10-CM

## 2013-04-04 ENCOUNTER — Other Ambulatory Visit: Payer: Medicaid Other

## 2013-04-04 ENCOUNTER — Ambulatory Visit: Payer: Medicaid Other

## 2013-05-05 ENCOUNTER — Ambulatory Visit
Admission: RE | Admit: 2013-05-05 | Discharge: 2013-05-05 | Disposition: A | Discharge status: Home / Self Care | Payer: Medicaid Other | Source: Ambulatory Visit | Attending: Family Medicine | Admitting: Family Medicine

## 2013-05-05 DIAGNOSIS — Z1231 Encounter for screening mammogram for malignant neoplasm of breast: Secondary | ICD-10-CM

## 2013-05-05 DIAGNOSIS — Z780 Asymptomatic menopausal state: Secondary | ICD-10-CM

## 2013-08-28 DIAGNOSIS — F1120 Opioid dependence, uncomplicated: Secondary | ICD-10-CM | POA: Insufficient documentation

## 2013-10-22 DIAGNOSIS — M961 Postlaminectomy syndrome, not elsewhere classified: Secondary | ICD-10-CM | POA: Insufficient documentation

## 2013-10-22 DIAGNOSIS — M5126 Other intervertebral disc displacement, lumbar region: Secondary | ICD-10-CM | POA: Insufficient documentation

## 2014-03-03 DIAGNOSIS — G8929 Other chronic pain: Secondary | ICD-10-CM | POA: Insufficient documentation

## 2014-07-14 HISTORY — PX: CATARACT EXTRACTION W/ INTRAOCULAR LENS IMPLANT: SHX1309

## 2015-01-25 DIAGNOSIS — A6004 Herpesviral vulvovaginitis: Secondary | ICD-10-CM | POA: Insufficient documentation

## 2015-02-10 ENCOUNTER — Emergency Department (HOSPITAL_COMMUNITY)
Admission: EM | Admit: 2015-02-10 | Discharge: 2015-02-10 | Disposition: A | Discharge status: Home / Self Care | Payer: Medicaid Other | Attending: Emergency Medicine | Admitting: Emergency Medicine

## 2015-02-10 ENCOUNTER — Encounter (HOSPITAL_COMMUNITY): Payer: Self-pay | Admitting: *Deleted

## 2015-02-10 ENCOUNTER — Emergency Department (HOSPITAL_COMMUNITY): Payer: Medicaid Other

## 2015-02-10 ENCOUNTER — Emergency Department (HOSPITAL_COMMUNITY)
Admission: EM | Admit: 2015-02-10 | Discharge: 2015-02-10 | Discharge status: Absent without leave | Payer: Medicaid Other | Source: Home / Self Care

## 2015-02-10 DIAGNOSIS — W01198A Fall on same level from slipping, tripping and stumbling with subsequent striking against other object, initial encounter: Secondary | ICD-10-CM | POA: Insufficient documentation

## 2015-02-10 DIAGNOSIS — S0031XA Abrasion of nose, initial encounter: Secondary | ICD-10-CM | POA: Insufficient documentation

## 2015-02-10 DIAGNOSIS — G8929 Other chronic pain: Secondary | ICD-10-CM | POA: Diagnosis not present

## 2015-02-10 DIAGNOSIS — I10 Essential (primary) hypertension: Secondary | ICD-10-CM | POA: Insufficient documentation

## 2015-02-10 DIAGNOSIS — Y9289 Other specified places as the place of occurrence of the external cause: Secondary | ICD-10-CM | POA: Diagnosis not present

## 2015-02-10 DIAGNOSIS — S022XXA Fracture of nasal bones, initial encounter for closed fracture: Secondary | ICD-10-CM | POA: Diagnosis not present

## 2015-02-10 DIAGNOSIS — J449 Chronic obstructive pulmonary disease, unspecified: Secondary | ICD-10-CM | POA: Diagnosis not present

## 2015-02-10 DIAGNOSIS — Y998 Other external cause status: Secondary | ICD-10-CM | POA: Insufficient documentation

## 2015-02-10 DIAGNOSIS — S0992XA Unspecified injury of nose, initial encounter: Secondary | ICD-10-CM | POA: Diagnosis present

## 2015-02-10 DIAGNOSIS — Y9389 Activity, other specified: Secondary | ICD-10-CM | POA: Diagnosis not present

## 2015-02-10 HISTORY — DX: Dorsalgia, unspecified: M54.9

## 2015-02-10 HISTORY — DX: Essential (primary) hypertension: I10

## 2015-02-10 HISTORY — DX: Chronic obstructive pulmonary disease, unspecified: J44.9

## 2015-02-10 HISTORY — DX: Other chronic pain: G89.29

## 2015-02-10 MED ORDER — HYDROCODONE-ACETAMINOPHEN 5-325 MG PO TABS: ORAL_TABLET | ORAL | Status: DC

## 2015-02-10 MED ORDER — HYDROCODONE-ACETAMINOPHEN 5-325 MG PO TABS
1.0000 | ORAL_TABLET | Freq: Once | ORAL | Status: AC
  Administered 2015-02-10: 1 via ORAL
  Filled 2015-02-10: qty 1

## 2015-02-10 NOTE — ED Provider Notes (Signed)
CSN: [id number]     Arrival date & time 02/10/15  1423 History  This chart was scribed for Monico Blitz, PA-C, working with Ezequiel Essex, MD by Steva Colder, ED Scribe. The patient was seen in room TR05C/TR05C at 4:07 PM.    Chief Complaint  Patient presents with  . Fall    The history is provided by the patient. No language interpreter was used.    HPI Comments: Jean Shaffer is a 64 y.o. female who presents to the Emergency Department complaining of fall onset early this morning. She reports that she was coming from the bathroom when she missed a stepped and fell face first on the concrete. She states that she is having associated symptoms of nausea, HA, neck pain, nosebleed.  She notes that her nosebleed occurred right after the fall. She reports that her HA is a 6/10. She notes that she took a tramadol this morning around 11 for her HA. She states that she has tried Ice and Tramadol with no relief for her symptoms. She denies LOC, knee pain, visual disturbance and any other symptoms. She reports that her last tetanus shot was 3-4 years ago. Pt denies being on blood thinners. Pt denies being allergic to any medications.   Past Medical History  Diagnosis Date  . Hypertension   . COPD (chronic obstructive pulmonary disease)   . Chronic back pain    History reviewed. No pertinent past surgical history. History reviewed. No pertinent family history. History  Substance Use Topics  . Smoking status: Not on file  . Smokeless tobacco: Not on file  . Alcohol Use: No   OB History    No data available     Review of Systems  HENT: Positive for nosebleeds.   Eyes: Negative for visual disturbance.  Musculoskeletal: Negative for joint swelling and arthralgias.  Neurological: Positive for dizziness and headaches. Negative for syncope.    A complete 10 system review of systems was obtained and all systems are negative except as noted in the HPI and PMH.    Allergies  Review of  patient's allergies indicates no known allergies.  Home Medications   Prior to Admission medications   Not on File   BP 144/84 mmHg  Pulse 83  Temp(Src) 98.1 F (36.7 C) (Oral)  Resp 20  SpO2 97%  Physical Exam  Constitutional: She is oriented to person, place, and time. She appears well-developed and well-nourished. No distress.  HENT:  Head: Normocephalic.  Right Ear: External ear normal.  Left Ear: External ear normal.  Mouth/Throat: Oropharynx is clear and moist.  Small abrasion to nasal bridge with associated swelling and tenderness to palpation.  Nasal septum is midline with no septal hematomas  No hemotympanum, battle signs or raccoon's eyes  No crepitance or tenderness to palpation along the orbital rim.  EOMI intact with no pain but + diplopia  No abnormal otorrhea or rhinorrhea.   No intraoral trauma.      Eyes: EOM are normal. Pupils are equal, round, and reactive to light.  Neck: Normal range of motion. Neck supple. No tracheal deviation present.  Lower midline C-spine tenderness to palpation with no step-offs appreciated  Cardiovascular: Normal rate, regular rhythm and intact distal pulses.   Pulmonary/Chest: Effort normal and breath sounds normal. No respiratory distress. She has no wheezes. She has no rales. She exhibits no tenderness.  Abdominal: Soft. Bowel sounds are normal.  Musculoskeletal: Normal range of motion.  Neurological: She is alert and oriented to  person, place, and time.  Follows commands, Clear, goal oriented speech, Strength is 5 out of 5x4 extremities, patient ambulates with a coordinated in nonantalgic gait. Sensation is grossly intact.   Skin: Skin is warm and dry.  Psychiatric: She has a normal mood and affect. Her behavior is normal.  Nursing note and vitals reviewed.   ED Course  Procedures (including critical care time) DIAGNOSTIC STUDIES: Oxygen Saturation is 97% on room air, normal by my interpretation.     COORDINATION OF CARE: 4:12 PM-Discussed treatment plan which includes CT maxillofacial, CT head, Norco, referral to ENT with pt at bedside and pt agreed to plan.   Labs Review Labs Reviewed - No data to display  Imaging Review Ct Head Wo Contrast  02/10/2015   CLINICAL DATA:  Right head and face pain and left neck pain after falling this morning and hitting her head and face on concrete.  EXAM: CT HEAD WITHOUT CONTRAST  CT MAXILLOFACIAL WITHOUT CONTRAST  CT CERVICAL SPINE WITHOUT CONTRAST  TECHNIQUE: Multidetector CT imaging of the head, cervical spine, and maxillofacial structures were performed using the standard protocol without intravenous contrast. Multiplanar CT image reconstructions of the cervical spine and maxillofacial structures were also generated.  COMPARISON:  Head CT dated 06/06/2005 and brain MR dated 06/06/2005.  FINDINGS: CT HEAD FINDINGS  Minimal and patchy white matter low density in both cerebral hemispheres. Normal size and position of the ventricles. No skull fracture, intracranial hemorrhage or paranasal sinus air-fluid levels.  CT MAXILLOFACIAL FINDINGS  Mild frontal scalp hematoma extending more cord right and toward the left. Mild right maxillary sinus mucosal thickening. Fracture of the distal aspect of the nasal bone with mild inferior angulation of the distal fragment without significant depression. There is also of a tiny fracture fragment distal to the distal aspect of the nasal bone. There is also a tiny fracture off the anterior aspect of the anterior maxillary spine. No other fractures are seen. No paranasal sinus air-fluid levels.  CT CERVICAL SPINE FINDINGS  Silhouette bone and anterior screw and plate fusion at the C4 through C6 levels with normal alignment. Anterior and posterior spur formation at multiple levels. No prevertebral soft tissue swelling, fractures or subluxations. Mild bilateral carotid artery calcification. Clear lung apices.  IMPRESSION: 1. Anterior  nasal bone and anterior maxillary spine fractures, as described above. 2. No skull fracture or intracranial hemorrhage. 3. No cervical spine fracture or subluxation. 4. Minimal chronic small vessel white matter ischemic changes in both cerebral hemispheres. 5. Mild chronic right maxillary sinusitis. 6. Cervical spine fusion and degenerative changes.   Electronically Signed   By: Claudie Revering M.D.   On: 02/10/2015 18:22   Ct Cervical Spine Wo Contrast  02/10/2015   CLINICAL DATA:  Right head and face pain and left neck pain after falling this morning and hitting her head and face on concrete.  EXAM: CT HEAD WITHOUT CONTRAST  CT MAXILLOFACIAL WITHOUT CONTRAST  CT CERVICAL SPINE WITHOUT CONTRAST  TECHNIQUE: Multidetector CT imaging of the head, cervical spine, and maxillofacial structures were performed using the standard protocol without intravenous contrast. Multiplanar CT image reconstructions of the cervical spine and maxillofacial structures were also generated.  COMPARISON:  Head CT dated 06/06/2005 and brain MR dated 06/06/2005.  FINDINGS: CT HEAD FINDINGS  Minimal and patchy white matter low density in both cerebral hemispheres. Normal size and position of the ventricles. No skull fracture, intracranial hemorrhage or paranasal sinus air-fluid levels.  CT MAXILLOFACIAL FINDINGS  Mild frontal scalp  hematoma extending more cord right and toward the left. Mild right maxillary sinus mucosal thickening. Fracture of the distal aspect of the nasal bone with mild inferior angulation of the distal fragment without significant depression. There is also of a tiny fracture fragment distal to the distal aspect of the nasal bone. There is also a tiny fracture off the anterior aspect of the anterior maxillary spine. No other fractures are seen. No paranasal sinus air-fluid levels.  CT CERVICAL SPINE FINDINGS  Silhouette bone and anterior screw and plate fusion at the C4 through C6 levels with normal alignment. Anterior and  posterior spur formation at multiple levels. No prevertebral soft tissue swelling, fractures or subluxations. Mild bilateral carotid artery calcification. Clear lung apices.  IMPRESSION: 1. Anterior nasal bone and anterior maxillary spine fractures, as described above. 2. No skull fracture or intracranial hemorrhage. 3. No cervical spine fracture or subluxation. 4. Minimal chronic small vessel white matter ischemic changes in both cerebral hemispheres. 5. Mild chronic right maxillary sinusitis. 6. Cervical spine fusion and degenerative changes.   Electronically Signed   By: Claudie Revering M.D.   On: 02/10/2015 18:22   Ct Maxillofacial Wo Cm  02/10/2015   CLINICAL DATA:  Right head and face pain and left neck pain after falling this morning and hitting her head and face on concrete.  EXAM: CT HEAD WITHOUT CONTRAST  CT MAXILLOFACIAL WITHOUT CONTRAST  CT CERVICAL SPINE WITHOUT CONTRAST  TECHNIQUE: Multidetector CT imaging of the head, cervical spine, and maxillofacial structures were performed using the standard protocol without intravenous contrast. Multiplanar CT image reconstructions of the cervical spine and maxillofacial structures were also generated.  COMPARISON:  Head CT dated 06/06/2005 and brain MR dated 06/06/2005.  FINDINGS: CT HEAD FINDINGS  Minimal and patchy white matter low density in both cerebral hemispheres. Normal size and position of the ventricles. No skull fracture, intracranial hemorrhage or paranasal sinus air-fluid levels.  CT MAXILLOFACIAL FINDINGS  Mild frontal scalp hematoma extending more cord right and toward the left. Mild right maxillary sinus mucosal thickening. Fracture of the distal aspect of the nasal bone with mild inferior angulation of the distal fragment without significant depression. There is also of a tiny fracture fragment distal to the distal aspect of the nasal bone. There is also a tiny fracture off the anterior aspect of the anterior maxillary spine. No other fractures  are seen. No paranasal sinus air-fluid levels.  CT CERVICAL SPINE FINDINGS  Silhouette bone and anterior screw and plate fusion at the C4 through C6 levels with normal alignment. Anterior and posterior spur formation at multiple levels. No prevertebral soft tissue swelling, fractures or subluxations. Mild bilateral carotid artery calcification. Clear lung apices.  IMPRESSION: 1. Anterior nasal bone and anterior maxillary spine fractures, as described above. 2. No skull fracture or intracranial hemorrhage. 3. No cervical spine fracture or subluxation. 4. Minimal chronic small vessel white matter ischemic changes in both cerebral hemispheres. 5. Mild chronic right maxillary sinusitis. 6. Cervical spine fusion and degenerative changes.   Electronically Signed   By: Claudie Revering M.D.   On: 02/10/2015 18:22     EKG Interpretation None      MDM   Final diagnoses:  Nasal fracture, closed, initial encounter    Filed Vitals:   02/10/15 1533  BP: 144/84  Pulse: 83  Temp: 98.1 F (36.7 C)  TempSrc: Oral  Resp: 20  SpO2: 97%    Medications  HYDROcodone-acetaminophen (NORCO/VICODIN) 5-325 MG per tablet 1 tablet (1 tablet Oral  Given 02/10/15 1712)    Jean Shaffer is a pleasant 64 y.o. female presenting with nasal trauma, headache and cervicalgia status post slip and fall onto concrete floor earlier in the day. Neuro exam is nonfocal.  CT shows an anterior nasal bone and anterior maxillary spine fracture. Discussed with patient, will give ENT referral  Discussed case with attending MD who agrees with plan and stability to d/c to home.    Evaluation does not show pathology that would require ongoing emergent intervention or inpatient treatment. Pt is hemodynamically stable and mentating appropriately. Discussed findings and plan with patient/guardian, who agrees with care plan. All questions answered. Return precautions discussed and outpatient follow up given.   Discharge Medication List as  of 02/10/2015  6:35 PM    START taking these medications   Details  HYDROcodone-acetaminophen (NORCO/VICODIN) 5-325 MG per tablet Take 1-2 tablets by mouth every 6 hours as needed for pain., Print          I personally performed the services described in this documentation, which was scribed in my presence. The recorded information has been reviewed and is accurate.      Monico Blitz, PA-C 02/11/15 0205  Ezequiel Essex, MD 02/11/15 [phone]

## 2015-02-10 NOTE — Discharge Instructions (Signed)
For pain control please take ibuprofen (also known as Motrin or Advil) 800mg  (this is normally 4 over the counter pills) 3 times a day  for 5 days. Take with food to minimize stomach irritation.  Please follow with your primary care doctor in the next 2 days for a check-up. They must obtain records for further management.   Do not hesitate to return to the Emergency Department for any new, worsening or concerning symptoms.    Nasal Fracture A nasal fracture is a break or crack in the bones of the nose. A minor break usually heals in a month. You often will receive black eyes from a nasal fracture. This is not a cause for concern. The black eyes will go away over 1 to 2 weeks.  DIAGNOSIS  Your caregiver may want to examine you if you are concerned about a fracture of the nose. X-rays of the nose may not show a nasal fracture even when one is present. Sometimes your caregiver must wait 1 to 5 days after the injury to re-check the nose for alignment and to take additional X-rays. Sometimes the caregiver must wait until the swelling has gone down. TREATMENT Minor fractures that have caused no deformity often do not require treatment. More serious fractures where bones are displaced may require surgery. This will take place after the swelling is gone. Surgery will stabilize and align the fracture. HOME CARE INSTRUCTIONS   Put ice on the injured area.  Put ice in a plastic bag.  Place a towel between your skin and the bag.  Leave the ice on for 15-20 minutes, 03-04 times a day.  Take medications as directed by your caregiver.  Only take over-the-counter or prescription medicines for pain, discomfort, or fever as directed by your caregiver.  If your nose starts bleeding, squeeze the soft parts of the nose against the center wall while you are sitting in an upright position for 10 minutes.  Contact sports should be avoided for at least 3 to 4 weeks or as directed by your caregiver. SEEK MEDICAL  CARE IF:  Your pain increases or becomes severe.  You continue to have nosebleeds.  The shape of your nose does not return to normal within 5 days.  You have pus draining from the nose. SEEK IMMEDIATE MEDICAL CARE IF:   You have bleeding from your nose that does not stop after 20 minutes of pinching the nostrils closed and keeping ice on the nose.  You have clear fluid draining from your nose.  You notice a grape-like swelling on the dividing wall between the nostrils (septum). This is a collection of blood (hematoma) that must be drained to help prevent infection.  You have difficulty moving your eyes.  You have recurrent vomiting. Document Released: 12/08/2000 Document Revised: 03/04/2012 Document Reviewed: 03/27/2011 [facility] Patient Information 2015 [place], [place]. This information is not intended to replace advice given to you by your health care provider. Make sure you discuss any questions you have with your health care provider.

## 2015-02-10 NOTE — ED Notes (Signed)
Pt reports getting up to use the restroom at 1am and pt tripped and fell, hit her face on cement. Denies loc. Initially had nose bleed and applied ice. Still having pain to nose, mouth, head and neck. Denies being on blood thinners.

## 2015-07-30 DIAGNOSIS — L409 Psoriasis, unspecified: Secondary | ICD-10-CM | POA: Insufficient documentation

## 2015-07-30 DIAGNOSIS — H269 Unspecified cataract: Secondary | ICD-10-CM | POA: Insufficient documentation

## 2015-08-09 ENCOUNTER — Other Ambulatory Visit (HOSPITAL_COMMUNITY): Payer: Self-pay | Admitting: Otolaryngology

## 2015-08-09 DIAGNOSIS — R1314 Dysphagia, pharyngoesophageal phase: Secondary | ICD-10-CM

## 2015-08-09 DIAGNOSIS — R130 Aphagia: Secondary | ICD-10-CM

## 2015-08-17 ENCOUNTER — Ambulatory Visit (HOSPITAL_COMMUNITY): Admission: RE | Admit: 2015-08-17 | Payer: Medicaid Other | Source: Ambulatory Visit

## 2015-08-17 ENCOUNTER — Ambulatory Visit (HOSPITAL_COMMUNITY): Payer: Medicaid Other

## 2015-10-18 HISTORY — PX: CATARACT EXTRACTION W/ INTRAOCULAR LENS IMPLANT: SHX1309

## 2016-02-16 ENCOUNTER — Other Ambulatory Visit: Payer: Self-pay | Admitting: Family Medicine

## 2016-02-16 DIAGNOSIS — E2839 Other primary ovarian failure: Secondary | ICD-10-CM

## 2016-02-16 DIAGNOSIS — Z1231 Encounter for screening mammogram for malignant neoplasm of breast: Secondary | ICD-10-CM

## 2016-03-21 ENCOUNTER — Ambulatory Visit: Payer: Medicaid Other

## 2016-03-21 ENCOUNTER — Other Ambulatory Visit: Payer: Medicaid Other

## 2016-04-11 ENCOUNTER — Ambulatory Visit
Admission: RE | Admit: 2016-04-11 | Discharge: 2016-04-11 | Disposition: A | Discharge status: Home / Self Care | Payer: Medicare Other | Source: Ambulatory Visit | Attending: Family Medicine | Admitting: Family Medicine

## 2016-04-11 DIAGNOSIS — Z1231 Encounter for screening mammogram for malignant neoplasm of breast: Secondary | ICD-10-CM

## 2016-04-11 DIAGNOSIS — E2839 Other primary ovarian failure: Secondary | ICD-10-CM

## 2016-11-22 DIAGNOSIS — H9190 Unspecified hearing loss, unspecified ear: Secondary | ICD-10-CM | POA: Insufficient documentation

## 2017-05-24 DIAGNOSIS — Z9181 History of falling: Secondary | ICD-10-CM | POA: Insufficient documentation

## 2017-09-17 ENCOUNTER — Emergency Department (HOSPITAL_COMMUNITY)
Admission: EM | Admit: 2017-09-17 | Discharge: 2017-09-17 | Disposition: A | Discharge status: Home / Self Care | Payer: Medicare Other | Attending: Emergency Medicine | Admitting: Emergency Medicine

## 2017-09-17 ENCOUNTER — Emergency Department (HOSPITAL_COMMUNITY): Payer: Medicare Other

## 2017-09-17 ENCOUNTER — Encounter (HOSPITAL_COMMUNITY): Payer: Self-pay | Admitting: Emergency Medicine

## 2017-09-17 DIAGNOSIS — J449 Chronic obstructive pulmonary disease, unspecified: Secondary | ICD-10-CM | POA: Insufficient documentation

## 2017-09-17 DIAGNOSIS — I10 Essential (primary) hypertension: Secondary | ICD-10-CM | POA: Diagnosis not present

## 2017-09-17 DIAGNOSIS — R079 Chest pain, unspecified: Secondary | ICD-10-CM | POA: Diagnosis present

## 2017-09-17 DIAGNOSIS — R091 Pleurisy: Secondary | ICD-10-CM | POA: Insufficient documentation

## 2017-09-17 LAB — CBC
HCT: 33.5 % — ABNORMAL LOW (ref 36.0–46.0)
Hemoglobin: 10.9 g/dL — ABNORMAL LOW (ref 12.0–15.0)
MCH: 28.1 pg (ref 26.0–34.0)
MCHC: 32.5 g/dL (ref 30.0–36.0)
MCV: 86.3 fL (ref 78.0–100.0)
Platelets: 260 10*3/uL (ref 150–400)
RBC: 3.88 MIL/uL (ref 3.87–5.11)
RDW: 13.6 % (ref 11.5–15.5)
WBC: 9.6 10*3/uL (ref 4.0–10.5)

## 2017-09-17 LAB — BASIC METABOLIC PANEL
Anion gap: 6 (ref 5–15)
BUN: 15 mg/dL (ref 6–20)
CO2: 26 mmol/L (ref 22–32)
Calcium: 9.4 mg/dL (ref 8.9–10.3)
Chloride: 106 mmol/L (ref 101–111)
Creatinine, Ser: 1.07 mg/dL — ABNORMAL HIGH (ref 0.44–1.00)
GFR calc Af Amer: >60 mL/min (ref >60)
GFR calc non Af Amer: 53 mL/min — ABNORMAL LOW (ref >60)
Glucose, Bld: 98 mg/dL (ref 65–99)
Potassium: 3.2 mmol/L — ABNORMAL LOW (ref 3.5–5.1)
Sodium: 138 mmol/L (ref 135–145)

## 2017-09-17 LAB — D-DIMER, QUANTITATIVE: D-Dimer, Quant: 0.39 ug/mL-FEU (ref 0.00–0.50)

## 2017-09-17 LAB — I-STAT TROPONIN, ED: Troponin i, poc: 0 ng/mL (ref 0.00–0.08)

## 2017-09-17 NOTE — ED Provider Notes (Signed)
[facility] DEPT Provider Note   CSN: [id number] Arrival date & time: 09/17/17  0104     History   Chief Complaint Chief Complaint  Patient presents with  . Chest Pain  . Shortness of Breath    HPI Jean Shaffer is a 66 y.o. female.  The history is provided by the patient.  Chest Pain   This is a new problem. The current episode started 6 to 12 hours ago. The problem occurs constantly. The problem has not changed since onset.The pain is present in the substernal region. The pain is moderate. The quality of the pain is described as sharp. The pain does not radiate. The symptoms are aggravated by deep breathing and certain positions. Associated symptoms include nausea and shortness of breath. Pertinent negatives include no fever, no hemoptysis, no lower extremity edema and no vomiting. Treatments tried: zantac. The treatment provided no relief. Risk factors include being elderly.  Her past medical history is significant for hyperlipidemia and hypertension.  Pertinent negatives for past medical history include no CAD and no PE.  Her family medical history is significant for CAD.  Shortness of Breath  Associated symptoms include chest pain. Pertinent negatives include no fever, no hemoptysis, no vomiting and no leg swelling. Associated medical issues do not include PE or CAD.  pt reports onset of CP earlier in the evening She reports it was "crampy" at first and now is sharp in nature and worse with breathing She has SOB No hemoptysis No fever/vomiting She had otherwise been well No h/o CAD/PE +fam h/o CAD She is a nonsmoker  Past Medical History:  Diagnosis Date  . Chronic back pain   . COPD (chronic obstructive pulmonary disease) ([place])   . Hypertension     There are no active problems to display for this patient.   History reviewed. No pertinent surgical history.  OB History    No data available       Home Medications    Prior to Admission medications     Medication Sig Start Date End Date Taking? Authorizing Provider  HYDROcodone-acetaminophen (NORCO/VICODIN) 5-325 MG per tablet Take 1-2 tablets by mouth every 6 hours as needed for pain. 02/10/15   Pisciotta, Charna Elizabeth    Family History No family history on file.  Social History Social History  Substance Use Topics  . Smoking status: Not on file  . Smokeless tobacco: Not on file  . Alcohol use No     Allergies   Patient has no known allergies.   Review of Systems Review of Systems  Constitutional: Negative for fever.  Respiratory: Positive for shortness of breath. Negative for hemoptysis.   Cardiovascular: Positive for chest pain. Negative for leg swelling.  Gastrointestinal: Positive for nausea. Negative for vomiting.  All other systems reviewed and are negative.    Physical Exam Updated Vital Signs BP (!) 192/80 (BP Location: Right Arm)   Pulse (!) 54   Temp 98 F (36.7 C) (Oral)   Resp 18   SpO2 100%   Physical Exam CONSTITUTIONAL: Well developed/well nourished HEAD: Normocephalic/atraumatic EYES: EOMI/PERRL ENMT: Mucous membranes moist NECK: supple no meningeal signs SPINE/BACK:entire spine nontender CV: S1/S2 noted, no murmurs/rubs/gallops noted LUNGS: Lungs are clear to auscultation bilaterally, no apparent distress Chest - tender to palpation ABDOMEN: soft, nontender, no rebound or guarding, bowel sounds noted throughout abdomen GU:no cva tenderness NEURO: Pt is awake/alert/appropriate, moves all extremitiesx4.  No facial droop.   EXTREMITIES: pulses normal/equal, full ROM, no Le edema or  tenderness SKIN: warm, color normal PSYCH: no abnormalities of mood noted, alert and oriented to situation   ED Treatments / Results  Labs (all labs ordered are listed, but only abnormal results are displayed) Labs Reviewed  BASIC METABOLIC PANEL - Abnormal; Notable for the following:       Result Value   Potassium 3.2 (*)    Creatinine, Ser 1.07 (*)    GFR  calc non Af Amer 53 (*)    All other components within normal limits  CBC - Abnormal; Notable for the following:    Hemoglobin 10.9 (*)    HCT 33.5 (*)    All other components within normal limits  D-DIMER, QUANTITATIVE (NOT AT [facility])  I-STAT TROPONIN, ED    EKG  EKG Interpretation  Date/Time:  Monday September 17 2017 01:20:43 EDT Ventricular Rate:  55 PR Interval:  210 QRS Duration: 96 QT Interval:  456 QTC Calculation: 436 R Axis:   74 Text Interpretation:  Sinus bradycardia with 1st degree A-V block Cannot rule out Anterior infarct , age undetermined Abnormal ECG No significant change since last tracing Confirmed by Ripley Fraise [phone]) on 09/17/2017 1:58:40 AM       Radiology Dg Chest 2 View  Result Date: 09/17/2017 CLINICAL DATA:  Chest pain EXAM: CHEST  2 VIEW COMPARISON:  None. FINDINGS: The heart size and mediastinal contours are within normal limits. Both lungs are clear. The visualized skeletal structures are unremarkable. IMPRESSION: No active cardiopulmonary disease. Electronically Signed   By: Ulyses Jarred M.D.   On: 09/17/2017 01:41    Procedures Procedures (including critical care time)  Medications Ordered in ED Medications - No data to display   Initial Impression / Assessment and Plan / ED Course  I have reviewed the triage vital signs and the nursing notes.  Pertinent labs & imaging results that were available during my care of the patient were reviewed by me and considered in my medical decision making (see chart for details).     Pt well appearing Labs/ekg/imaging negative She reports mostly pleurisy, sharp CP with breathing as well as pain with breathing Low suspicion for ACS/PE/Dissection Will d/c home We discussed strict ER return precautions   Final Clinical Impressions(s) / ED Diagnoses   Final diagnoses:  Pleurisy    New Prescriptions New Prescriptions   No medications on file     Ripley Fraise, MD 09/17/17 [phone]

## 2017-09-17 NOTE — ED Triage Notes (Signed)
Pt BIB [facility] for chest pain that started around 2000. Pt was at rest and describes it as sharp, substernal pain. Pt also had some SHOB, diaphoresis, and nausea. EMS administered 324 asa and 2 nitro with some chest pain relief and 4 mg zofran ODT. Pt took zantac and tramadol at home without relief

## 2017-09-20 DIAGNOSIS — M5416 Radiculopathy, lumbar region: Secondary | ICD-10-CM | POA: Insufficient documentation

## 2018-12-06 ENCOUNTER — Observation Stay (HOSPITAL_COMMUNITY)
Admission: EM | Admit: 2018-12-06 | Discharge: 2018-12-08 | Disposition: A | Discharge status: Home / Self Care | Payer: Medicare Other | Attending: Internal Medicine | Admitting: Internal Medicine

## 2018-12-06 ENCOUNTER — Emergency Department (HOSPITAL_COMMUNITY): Payer: Medicare Other

## 2018-12-06 ENCOUNTER — Encounter (HOSPITAL_COMMUNITY): Payer: Self-pay | Admitting: Emergency Medicine

## 2018-12-06 DIAGNOSIS — J449 Chronic obstructive pulmonary disease, unspecified: Secondary | ICD-10-CM | POA: Diagnosis not present

## 2018-12-06 DIAGNOSIS — R7989 Other specified abnormal findings of blood chemistry: Secondary | ICD-10-CM

## 2018-12-06 DIAGNOSIS — Z23 Encounter for immunization: Secondary | ICD-10-CM | POA: Diagnosis not present

## 2018-12-06 DIAGNOSIS — M542 Cervicalgia: Secondary | ICD-10-CM

## 2018-12-06 DIAGNOSIS — R778 Other specified abnormalities of plasma proteins: Secondary | ICD-10-CM

## 2018-12-06 DIAGNOSIS — W19XXXA Unspecified fall, initial encounter: Secondary | ICD-10-CM | POA: Insufficient documentation

## 2018-12-06 DIAGNOSIS — R55 Syncope and collapse: Principal | ICD-10-CM

## 2018-12-06 DIAGNOSIS — R4020 Unspecified coma: Secondary | ICD-10-CM | POA: Diagnosis present

## 2018-12-06 DIAGNOSIS — I10 Essential (primary) hypertension: Secondary | ICD-10-CM | POA: Insufficient documentation

## 2018-12-06 DIAGNOSIS — S80211A Abrasion, right knee, initial encounter: Secondary | ICD-10-CM

## 2018-12-06 DIAGNOSIS — Z87891 Personal history of nicotine dependence: Secondary | ICD-10-CM | POA: Diagnosis not present

## 2018-12-06 HISTORY — DX: Disorder of thyroid, unspecified: E07.9

## 2018-12-06 LAB — COMPREHENSIVE METABOLIC PANEL
ALT: 21 U/L (ref 0–44)
AST: 19 U/L (ref 15–41)
Albumin: 3.9 g/dL (ref 3.5–5.0)
Alkaline Phosphatase: 77 U/L (ref 38–126)
Anion gap: 10 (ref 5–15)
BUN: 10 mg/dL (ref 8–23)
CO2: 25 mmol/L (ref 22–32)
Calcium: 9.3 mg/dL (ref 8.9–10.3)
Chloride: 105 mmol/L (ref 98–111)
Creatinine, Ser: 1.1 mg/dL — ABNORMAL HIGH (ref 0.44–1.00)
GFR calc Af Amer: 60 mL/min — ABNORMAL LOW (ref >60)
GFR calc non Af Amer: 52 mL/min — ABNORMAL LOW (ref >60)
Glucose, Bld: 98 mg/dL (ref 70–99)
Potassium: 3.6 mmol/L (ref 3.5–5.1)
Sodium: 140 mmol/L (ref 135–145)
Total Bilirubin: 0.5 mg/dL (ref 0.3–1.2)
Total Protein: 7.4 g/dL (ref 6.5–8.1)

## 2018-12-06 LAB — CBC
HCT: 39 % (ref 36.0–46.0)
Hemoglobin: 12 g/dL (ref 12.0–15.0)
MCH: 27.6 pg (ref 26.0–34.0)
MCHC: 30.8 g/dL (ref 30.0–36.0)
MCV: 89.7 fL (ref 80.0–100.0)
Platelets: 304 10*3/uL (ref 150–400)
RBC: 4.35 MIL/uL (ref 3.87–5.11)
RDW: 13.5 % (ref 11.5–15.5)
WBC: 11.7 10*3/uL — ABNORMAL HIGH (ref 4.0–10.5)
nRBC: 0 % (ref 0.0–0.2)

## 2018-12-06 LAB — CBG MONITORING, ED: Glucose-Capillary: 87 mg/dL (ref 70–99)

## 2018-12-06 NOTE — ED Notes (Addendum)
Family at bedside. 

## 2018-12-06 NOTE — ED Triage Notes (Signed)
  Patient BIB EMS after falling at home. Patient was helping clean up at church, went home to get more cleaning supplies and fell.  Patient called her son to come help her and when he arrived she had AMS and did not remember calling him or falling.  Patient is alert but confused about what has happened during the day.  BP 218/108 in route and patient complaining of headache.  Patient is complaining of headache and neck pain 3/10.  Hx COPD, back pain, hypothyroid.

## 2018-12-06 NOTE — ED Notes (Signed)
Urine collected and sitting at bedside

## 2018-12-06 NOTE — ED Provider Notes (Signed)
[facility] DEPART[facility] Provider Note   CSN: [id number] Arrival date & time: 12/06/18  2150     History   Chief Complaint Chief Complaint  Patient presents with  . Fall  . Altered Mental Status    HPI Jean Shaffer is a 67 y.o. female with PMH HTN, COPD presenting after unwitnessed LOC. Patient had gone home after helping her son clean, left to run home quickly, when she called and stated she didn't remember what had happened but had hurt her knee. This has never occurred before. She is unsure if she fell and hit her head but had headache and states her neck feels sore when she flexes it. She is not on blood thinners. She is alert and oriented and last remembers sitting in her chair at home and helping clean at church. She states she did not take anything or have any recent changes in medications.  History is partially supplied by her son.    Past Medical History:  Diagnosis Date  . Chronic back pain   . COPD (chronic obstructive pulmonary disease) ([place])   . Hypertension     There are no active problems to display for this patient.   History reviewed. No pertinent surgical history.   OB History   No obstetric history on file.      Home Medications    Prior to Admission medications   Medication Sig Start Date End Date Taking? Authorizing Provider  HYDROcodone-acetaminophen (NORCO/VICODIN) 5-325 MG per tablet Take 1-2 tablets by mouth every 6 hours as needed for pain. 02/10/15   Pisciotta, Charna Elizabeth    Family History No family history on file.  Social History Social History   Tobacco Use  . Smoking status: Not on file  Substance Use Topics  . Alcohol use: No  . Drug use: No     Allergies   Patient has no known allergies.   Review of Systems Review of Systems Review of systems reviewed and are negative for acute change, except as noted in the HPI.    Physical Exam Updated Vital Signs BP (!) 162/68   Pulse 66    Temp 98 F (36.7 C) (Oral)   Resp 18   Ht 5\' 4"  (1.626 m)   Wt 63.5 kg   SpO2 98%   BMI 24.03 kg/m   Physical Exam Constitutional:      General: She is awake.     Appearance: Normal appearance. She is well-developed. She is not ill-appearing.  HENT:     Head: Normocephalic and atraumatic.  Neck:     Musculoskeletal: Pain with movement and muscular tenderness present.  Cardiovascular:     Rate and Rhythm: Normal rate and regular rhythm.     Heart sounds: Normal heart sounds. No murmur. No gallop.   Pulmonary:     Effort: Pulmonary effort is normal.  Abdominal:     General: There is no distension.     Palpations: Abdomen is soft.     Tenderness: There is no abdominal tenderness.  Musculoskeletal:     Right knee: She exhibits bony tenderness. She exhibits normal range of motion, no LCL laxity and no MCL laxity. Tenderness found. No medial joint line, no lateral joint line, no MCL, no LCL and no patellar tendon tenderness noted.     Right lower leg: No edema.     Left lower leg: No edema.  Neurological:     General: No focal deficit present.  Mental Status: She is alert.     Cranial Nerves: Cranial nerves are intact.     Sensory: Sensation is intact.     Motor: Motor function is intact.     Coordination: Coordination is intact.  Psychiatric:        Behavior: Behavior is cooperative.      ED Treatments / Results  Labs (all labs ordered are listed, but only abnormal results are displayed) Labs Reviewed  COMPREHENSIVE METABOLIC PANEL - Abnormal; Notable for the following components:      Result Value   Creatinine, Ser 1.10 (*)    GFR calc non Af Amer 52 (*)    GFR calc Af Amer 60 (*)    All other components within normal limits  CBC - Abnormal; Notable for the following components:   WBC 11.7 (*)    All other components within normal limits  I-STAT TROPONIN, ED - Abnormal; Notable for the following components:   Troponin i, poc 0.10 (*)    All other components  within normal limits  URINALYSIS, ROUTINE W REFLEX MICROSCOPIC  TROPONIN I  TROPONIN I  TROPONIN I  CBG MONITORING, ED    EKG EKG Interpretation  Date/Time:  Friday December 06 2018 22:05:46 EST Ventricular Rate:  69 PR Interval:    QRS Duration: 98 QT Interval:  438 QTC Calculation: 470 R Axis:   74 Text Interpretation:  Sinus rhythm Borderline prolonged PR interval Probable left atrial enlargement Confirmed by Elnora Morrison [phone]) on 12/06/2018 10:47:48 PM   Radiology Ct Head Wo Contrast  Result Date: 12/06/2018 CLINICAL DATA:  [place] and hit head headache with neck pain EXAM: CT HEAD WITHOUT CONTRAST CT CERVICAL SPINE WITHOUT CONTRAST TECHNIQUE: Multidetector CT imaging of the head and cervical spine was performed following the standard protocol without intravenous contrast. Multiplanar CT image reconstructions of the cervical spine were also generated. COMPARISON:  CT 02/10/2015 FINDINGS: CT HEAD FINDINGS Brain: No acute territorial infarction, hemorrhage or intracranial mass. Mild hypodensity in the subcortical white matter likely small vessel ischemic change. Stable ventricle size. Vascular: No hyperdense vessels. Scattered calcifications at the carotid siphon. Skull: Normal. Negative for fracture or focal lesion. Sinuses/Orbits: No acute finding. Other: None CT CERVICAL SPINE FINDINGS Alignment: No subluxation.  Facet alignment within normal limits. Skull base and vertebrae: No acute fracture. No primary bone lesion or focal pathologic process. Soft tissues and spinal canal: No prevertebral fluid or swelling. No visible canal hematoma. Disc levels: Anterior plate and screw fixation with solid bone fusion C4 through C6. Moderate degenerative change at C2-C3, C3-C4 and mild degenerative change C6-C7. Upper chest: Subcentimeter hypodense nodule right lobe of thyroid. Apices clear Other: None IMPRESSION: 1. No CT evidence for acute intracranial abnormality. Mild hypodensity in the white  matter suggesting small vessel ischemic change 2. Postsurgical changes C4 through C6. No acute osseous abnormality. Electronically Signed   By: Donavan Foil M.D.   On: 12/06/2018 23:43   Ct Cervical Spine Wo Contrast  Result Date: 12/06/2018 CLINICAL DATA:  [place] and hit head headache with neck pain EXAM: CT HEAD WITHOUT CONTRAST CT CERVICAL SPINE WITHOUT CONTRAST TECHNIQUE: Multidetector CT imaging of the head and cervical spine was performed following the standard protocol without intravenous contrast. Multiplanar CT image reconstructions of the cervical spine were also generated. COMPARISON:  CT 02/10/2015 FINDINGS: CT HEAD FINDINGS Brain: No acute territorial infarction, hemorrhage or intracranial mass. Mild hypodensity in the subcortical white matter likely small vessel ischemic change. Stable ventricle size. Vascular: No hyperdense  vessels. Scattered calcifications at the carotid siphon. Skull: Normal. Negative for fracture or focal lesion. Sinuses/Orbits: No acute finding. Other: None CT CERVICAL SPINE FINDINGS Alignment: No subluxation.  Facet alignment within normal limits. Skull base and vertebrae: No acute fracture. No primary bone lesion or focal pathologic process. Soft tissues and spinal canal: No prevertebral fluid or swelling. No visible canal hematoma. Disc levels: Anterior plate and screw fixation with solid bone fusion C4 through C6. Moderate degenerative change at C2-C3, C3-C4 and mild degenerative change C6-C7. Upper chest: Subcentimeter hypodense nodule right lobe of thyroid. Apices clear Other: None IMPRESSION: 1. No CT evidence for acute intracranial abnormality. Mild hypodensity in the white matter suggesting small vessel ischemic change 2. Postsurgical changes C4 through C6. No acute osseous abnormality. Electronically Signed   By: Donavan Foil M.D.   On: 12/06/2018 23:43   Dg Knee Complete 4 Views Right  Result Date: 12/06/2018 CLINICAL DATA:  Fall.  Confusion.  Trauma. EXAM:  RIGHT KNEE - COMPLETE 4+ VIEW COMPARISON:  None. FINDINGS: Patellar spurring at the quadriceps and patellar tendon attachment sites. No appreciable fracture or knee effusion. Otherwise, no significant abnormalities are observed. IMPRESSION: 1. Mild patellar spurring. Otherwise, no significant abnormalities are observed. Electronically Signed   By: Van Clines M.D.   On: 12/06/2018 23:31    Procedures Procedures (including critical care time)  Medications Ordered in ED Medications  fentaNYL (SUBLIMAZE) injection 50 mcg (50 mcg Intravenous Given 12/07/18 0029)     Initial Impression / Assessment and Plan / ED Course  I have reviewed the triage vital signs and the nursing notes.  Pertinent labs & imaging results that were available during my care of the patient were reviewed by me and considered in my medical decision making (see chart for details).  Clinical Course as of Dec 08 43  Fri Dec 06, 2018  2312 67yo female with hx of HTN and COPD presenting after unwitnessed LOC. She does not remember event but has neck pain and head pain. This has never happened before. Cranial nerves intact. Will obtain head and neck CT as she does have neck tenderness and unclear if pt hit her head.    [JS]    Clinical Course User Index [JS] Shylo Zamor A, DO   CT head and neck unremarkable, C-spine cleared. LOC uncertain etiology at this time. Troponin is .1 without previous history of heart dysfunction. She does endorse pain in her teeth which could possibly be from her fall vs. possible seizure. No chest pain, nausea, or arm pain. Unassigned paged for admission for observation and further workup.   Final Clinical Impressions(s) / ED Diagnoses   Final diagnoses:  LOC (loss of consciousness) ([place])  Neck pain  Abrasion of right knee, initial encounter  Elevated troponin    ED Discharge Orders    None       Jaimeson Gopal A, DO 12/07/18 0044    Elnora Morrison, MD 12/08/18 [phone]

## 2018-12-07 ENCOUNTER — Observation Stay (HOSPITAL_COMMUNITY): Payer: Medicare Other

## 2018-12-07 ENCOUNTER — Encounter (HOSPITAL_COMMUNITY): Payer: Self-pay | Admitting: Internal Medicine

## 2018-12-07 ENCOUNTER — Observation Stay (HOSPITAL_BASED_OUTPATIENT_CLINIC_OR_DEPARTMENT_OTHER): Payer: Medicare Other

## 2018-12-07 ENCOUNTER — Other Ambulatory Visit: Payer: Self-pay

## 2018-12-07 DIAGNOSIS — J449 Chronic obstructive pulmonary disease, unspecified: Secondary | ICD-10-CM | POA: Diagnosis present

## 2018-12-07 DIAGNOSIS — R55 Syncope and collapse: Secondary | ICD-10-CM | POA: Diagnosis not present

## 2018-12-07 DIAGNOSIS — I10 Essential (primary) hypertension: Secondary | ICD-10-CM

## 2018-12-07 DIAGNOSIS — R4020 Unspecified coma: Secondary | ICD-10-CM | POA: Diagnosis not present

## 2018-12-07 DIAGNOSIS — R7989 Other specified abnormal findings of blood chemistry: Secondary | ICD-10-CM | POA: Diagnosis not present

## 2018-12-07 DIAGNOSIS — S80211A Abrasion, right knee, initial encounter: Secondary | ICD-10-CM | POA: Diagnosis not present

## 2018-12-07 LAB — URINALYSIS, ROUTINE W REFLEX MICROSCOPIC
Bacteria, UA: NONE SEEN
Bilirubin Urine: NEGATIVE
Glucose, UA: NEGATIVE mg/dL
Hgb urine dipstick: NEGATIVE
Ketones, ur: NEGATIVE mg/dL
Nitrite: NEGATIVE
Protein, ur: NEGATIVE mg/dL
Specific Gravity, Urine: 1.005 (ref 1.005–1.030)
pH: 7 (ref 5.0–8.0)

## 2018-12-07 LAB — TROPONIN I
Troponin I: 0.09 ng/mL (ref <0.03)
Troponin I: 0.15 ng/mL (ref <0.03)
Troponin I: 0.05 ng/mL (ref <0.03)

## 2018-12-07 LAB — ECHOCARDIOGRAM COMPLETE
Height: 64 in
Weight: 2239.87 oz

## 2018-12-07 LAB — I-STAT TROPONIN, ED: Troponin i, poc: 0.1 ng/mL (ref 0.00–0.08)

## 2018-12-07 LAB — HIV ANTIBODY (ROUTINE TESTING W REFLEX): HIV Screen 4th Generation wRfx: NONREACTIVE

## 2018-12-07 LAB — CBG MONITORING, ED: Glucose-Capillary: 94 mg/dL (ref 70–99)

## 2018-12-07 LAB — TSH: TSH: 2.943 u[IU]/mL (ref 0.350–4.500)

## 2018-12-07 MED ORDER — SODIUM CHLORIDE 0.9% FLUSH
3.0000 mL | Freq: Two times a day (BID) | INTRAVENOUS | Status: DC
  Administered 2018-12-07 – 2018-12-08 (×3): 3 mL via INTRAVENOUS

## 2018-12-07 MED ORDER — ENOXAPARIN SODIUM 40 MG/0.4ML ~~LOC~~ SOLN
40.0000 mg | SUBCUTANEOUS | Status: DC
  Filled 2018-12-07: qty 0.4

## 2018-12-07 MED ORDER — ENSURE ENLIVE PO LIQD
237.0000 mL | Freq: Two times a day (BID) | ORAL | Status: DC
  Administered 2018-12-07 – 2018-12-08 (×2): 237 mL via ORAL

## 2018-12-07 MED ORDER — HYDROCODONE-ACETAMINOPHEN 5-325 MG PO TABS
1.0000 | ORAL_TABLET | Freq: Four times a day (QID) | ORAL | Status: DC | PRN
  Administered 2018-12-07: 1 via ORAL
  Filled 2018-12-07: qty 1

## 2018-12-07 MED ORDER — ACETAMINOPHEN 325 MG PO TABS
650.0000 mg | ORAL_TABLET | Freq: Four times a day (QID) | ORAL | Status: DC | PRN
  Administered 2018-12-07: 650 mg via ORAL
  Filled 2018-12-07: qty 2

## 2018-12-07 MED ORDER — FENTANYL CITRATE (PF) 100 MCG/2ML IJ SOLN
50.0000 ug | Freq: Once | INTRAMUSCULAR | Status: AC
  Administered 2018-12-07: 50 ug via INTRAVENOUS
  Filled 2018-12-07: qty 2

## 2018-12-07 MED ORDER — INFLUENZA VAC SPLIT HIGH-DOSE 0.5 ML IM SUSY
0.5000 mL | PREFILLED_SYRINGE | INTRAMUSCULAR | Status: AC
  Administered 2018-12-08: 0.5 mL via INTRAMUSCULAR
  Filled 2018-12-07: qty 0.5

## 2018-12-07 NOTE — ED Notes (Signed)
EEG being performed bedside at this time

## 2018-12-07 NOTE — ED Notes (Signed)
Ordered bfast tray 

## 2018-12-07 NOTE — Progress Notes (Signed)
EEG completed; results pending.    

## 2018-12-07 NOTE — H&P (Signed)
History and Physical    ROMANA DEATON PJA:[id number] DOB: 07-17-1951 DOA: 12/06/2018  PCP: [name], MD  Patient coming from: Home  I have personally briefly reviewed patient's old medical records in [place]  Chief Complaint: Fall, [place]  HPI: MALAYZIA LAFORTE is a 67 y.o. female with medical history significant of HTN, COPD.  Patient presents to the ED after episode of amnesia.   Patient was helping clean up at church, went home to get more cleaning supplies and fell.  Patient called her son to come help her and when he arrived she had AMS and did not remember calling him or falling.   ED Course: Patient is now alert and oriented, but still has incomplete memory of events during day.  Initial BP with EMS reported as 218/108.  170s by time she is in the ED and 138/59 currently.  No CP no SOB, but does have trop of 0.10, 0.09 on repeat.   Review of Systems: As per HPI otherwise 10 point review of systems negative.   Past Medical History:  Diagnosis Date  . Chronic back pain   . COPD (chronic obstructive pulmonary disease) ([place])   . Hypertension   . Thyroid disease     Past Surgical History:  Procedure Laterality Date  . ABDOMINAL HYSTERECTOMY    . COLONOSCOPY    . NECK SURGERY    . SPINE SURGERY       reports that she has quit smoking. She does not have any smokeless tobacco history on file. She reports that she does not drink alcohol or use drugs.  No Known Allergies  Family History  Problem Relation Age of Onset  . Alcohol abuse Father   . Cancer Father   . Heart disease Father   . Cancer Mother   . Cancer Maternal Grandfather   . Heart disease Maternal Grandfather   . Heart disease Paternal Grandmother   . Diabetes Paternal Grandmother   . Diabetes Paternal Grandfather      Prior to Admission medications   Medication Sig Start Date End Date Taking? Authorizing Provider  HYDROcodone-acetaminophen (NORCO/VICODIN) 5-325 MG per tablet Take  1-2 tablets by mouth every 6 hours as needed for pain. 02/10/15   Pisciotta, Elmyra Ricks, PA-C    Physical Exam: Vitals:   12/07/18 0045 12/07/18 0130 12/07/18 0215 12/07/18 0300  BP: (!) 162/73 127/60 (!) 143/62 (!) 138/59  Pulse: 67 63 63 61  Resp: 14 14 16 14   Temp:      TempSrc:      SpO2: 97% 95% 97% 97%  Weight:      Height:        Constitutional: NAD, calm, comfortable Eyes: PERRL, lids and conjunctivae normal ENMT: Mucous membranes are moist. Posterior pharynx clear of any exudate or lesions.Normal dentition.  Neck: normal, supple, no masses, no thyromegaly Respiratory: clear to auscultation bilaterally, no wheezing, no crackles. Normal respiratory effort. No accessory muscle use.  Cardiovascular: Regular rate and rhythm, no murmurs / rubs / gallops. No extremity edema. 2+ pedal pulses. No carotid bruits.  Abdomen: no tenderness, no masses palpated. No hepatosplenomegaly. Bowel sounds positive.  Musculoskeletal: no clubbing / cyanosis. No joint deformity upper and lower extremities. Good ROM, no contractures. Normal muscle tone.  Skin: no rashes, lesions, ulcers. No induration Neurologic: CN 2-12 grossly intact. Sensation intact, DTR normal. Strength 5/5 in all 4.  Psychiatric: Normal judgment and insight. Alert and oriented x 3. Normal mood.    Labs on  Admission: I have personally reviewed following labs and imaging studies  CBC: Recent Labs  Lab 12/06/18 2215  WBC 11.7*  HGB 12.0  HCT 39.0  MCV 89.7  PLT 528   Basic Metabolic Panel: Recent Labs  Lab 12/06/18 2215  NA 140  K 3.6  CL 105  CO2 25  GLUCOSE 98  BUN 10  CREATININE 1.10*  CALCIUM 9.3   GFR: Estimated Creatinine Clearance: 42.3 mL/min (A) (by C-G formula based on SCr of 1.1 mg/dL (H)). Liver Function Tests: Recent Labs  Lab 12/06/18 2215  AST 19  ALT 21  ALKPHOS 77  BILITOT 0.5  PROT 7.4  ALBUMIN 3.9   No results for input(s): LIPASE, AMYLASE in the last 168 hours. No results for  input(s): AMMONIA in the last 168 hours. Coagulation Profile: No results for input(s): INR, PROTIME in the last 168 hours. Cardiac Enzymes: Recent Labs  Lab 12/06/18 2345  TROPONINI 0.09*   BNP (last 3 results) No results for input(s): PROBNP in the last 8760 hours. HbA1C: No results for input(s): HGBA1C in the last 72 hours. CBG: Recent Labs  Lab 12/06/18 2221  GLUCAP 87   Lipid Profile: No results for input(s): CHOL, HDL, LDLCALC, TRIG, CHOLHDL, LDLDIRECT in the last 72 hours. Thyroid Function Tests: No results for input(s): TSH, T4TOTAL, FREET4, T3FREE, THYROIDAB in the last 72 hours. Anemia Panel: No results for input(s): VITAMINB12, FOLATE, FERRITIN, TIBC, IRON, RETICCTPCT in the last 72 hours. Urine analysis:    Component Value Date/Time   COLORURINE STRAW (A) 12/07/2018 0011   APPEARANCEUR CLEAR 12/07/2018 0011   LABSPEC 1.005 12/07/2018 0011   PHURINE 7.0 12/07/2018 0011   GLUCOSEU NEGATIVE 12/07/2018 0011   HGBUR NEGATIVE 12/07/2018 0011   BILIRUBINUR NEGATIVE 12/07/2018 0011   KETONESUR NEGATIVE 12/07/2018 0011   PROTEINUR NEGATIVE 12/07/2018 0011   UROBILINOGEN 4.0 (H) 11/17/2007 1732   NITRITE NEGATIVE 12/07/2018 0011   LEUKOCYTESUR TRACE (A) 12/07/2018 0011    Radiological Exams on Admission: Ct Head Wo Contrast  Result Date: 12/06/2018 CLINICAL DATA:  [place] and hit head headache with neck pain EXAM: CT HEAD WITHOUT CONTRAST CT CERVICAL SPINE WITHOUT CONTRAST TECHNIQUE: Multidetector CT imaging of the head and cervical spine was performed following the standard protocol without intravenous contrast. Multiplanar CT image reconstructions of the cervical spine were also generated. COMPARISON:  CT 02/10/2015 FINDINGS: CT HEAD FINDINGS Brain: No acute territorial infarction, hemorrhage or intracranial mass. Mild hypodensity in the subcortical white matter likely small vessel ischemic change. Stable ventricle size. Vascular: No hyperdense vessels. Scattered  calcifications at the carotid siphon. Skull: Normal. Negative for fracture or focal lesion. Sinuses/Orbits: No acute finding. Other: None CT CERVICAL SPINE FINDINGS Alignment: No subluxation.  Facet alignment within normal limits. Skull base and vertebrae: No acute fracture. No primary bone lesion or focal pathologic process. Soft tissues and spinal canal: No prevertebral fluid or swelling. No visible canal hematoma. Disc levels: Anterior plate and screw fixation with solid bone fusion C4 through C6. Moderate degenerative change at C2-C3, C3-C4 and mild degenerative change C6-C7. Upper chest: Subcentimeter hypodense nodule right lobe of thyroid. Apices clear Other: None IMPRESSION: 1. No CT evidence for acute intracranial abnormality. Mild hypodensity in the white matter suggesting small vessel ischemic change 2. Postsurgical changes C4 through C6. No acute osseous abnormality. Electronically Signed   By: Donavan Foil M.D.   On: 12/06/2018 23:43   Ct Cervical Spine Wo Contrast  Result Date: 12/06/2018 CLINICAL DATA:  [place] and hit head  headache with neck pain EXAM: CT HEAD WITHOUT CONTRAST CT CERVICAL SPINE WITHOUT CONTRAST TECHNIQUE: Multidetector CT imaging of the head and cervical spine was performed following the standard protocol without intravenous contrast. Multiplanar CT image reconstructions of the cervical spine were also generated. COMPARISON:  CT 02/10/2015 FINDINGS: CT HEAD FINDINGS Brain: No acute territorial infarction, hemorrhage or intracranial mass. Mild hypodensity in the subcortical white matter likely small vessel ischemic change. Stable ventricle size. Vascular: No hyperdense vessels. Scattered calcifications at the carotid siphon. Skull: Normal. Negative for fracture or focal lesion. Sinuses/Orbits: No acute finding. Other: None CT CERVICAL SPINE FINDINGS Alignment: No subluxation.  Facet alignment within normal limits. Skull base and vertebrae: No acute fracture. No primary bone lesion  or focal pathologic process. Soft tissues and spinal canal: No prevertebral fluid or swelling. No visible canal hematoma. Disc levels: Anterior plate and screw fixation with solid bone fusion C4 through C6. Moderate degenerative change at C2-C3, C3-C4 and mild degenerative change C6-C7. Upper chest: Subcentimeter hypodense nodule right lobe of thyroid. Apices clear Other: None IMPRESSION: 1. No CT evidence for acute intracranial abnormality. Mild hypodensity in the white matter suggesting small vessel ischemic change 2. Postsurgical changes C4 through C6. No acute osseous abnormality. Electronically Signed   By: Donavan Foil M.D.   On: 12/06/2018 23:43   Dg Knee Complete 4 Views Right  Result Date: 12/06/2018 CLINICAL DATA:  Fall.  Confusion.  Trauma. EXAM: RIGHT KNEE - COMPLETE 4+ VIEW COMPARISON:  None. FINDINGS: Patellar spurring at the quadriceps and patellar tendon attachment sites. No appreciable fracture or knee effusion. Otherwise, no significant abnormalities are observed. IMPRESSION: 1. Mild patellar spurring. Otherwise, no significant abnormalities are observed. Electronically Signed   By: Van Clines M.D.   On: 12/06/2018 23:31    EKG: Independently reviewed.  Assessment/Plan Principal Problem:   Syncope Active Problems:   HTN (hypertension)   COPD (chronic obstructive pulmonary disease) ([place])    1. Syncope - vs transient global amnesia 1. Will do syncope observation and work up given the slightly abnormal troponin in this patient, and reported BP 218/108 with EMS, though as I told patient history is more c/w TGA 2. Syncope pathway 3. Tele monitor 4. Serial trops 5. 2d echo 6. Check TSH 7. Spoke with Dr. Leonel Ramsay: 1. MRI brain 2. EEG 2. HTN - 1. Continue home meds once med rec completed 3. COPD - continue home inhalers once med rec completed  DVT prophylaxis: Lovenox Code Status: Full Family Communication: Family at bedside Disposition Plan: Home after  admit Consults called: None Admission status: Place in [place], [place] [facility] Pager [phone] Only works nights!  If 7AM-7PM, please contact the primary day team physician taking care of patient  [email or web address] Password [email or web address]  12/07/2018, 4:05 AM

## 2018-12-07 NOTE — ED Notes (Signed)
Patient ambulatory to bathroom with unsteady gait at this time, accompanied by staff. Patient transferred to progressive care area, verbalizes understanding.

## 2018-12-07 NOTE — Procedures (Signed)
ELECTROENCEPHALOGRAM REPORT   Patient: Jean Shaffer       Room #: 020C EEG No. ID: [id number] Age: 67 y.o.        Sex: female Referring Physician: Jonelle Sidle Report Date:  12/07/2018        Interpreting Physician: Alexis Goodell  History: Jean Shaffer is an 67 y.o. female presenting with an episode of amnesia  Medications:  None  Conditions of Recording:  This is a 21 channel routine scalp EEG performed with bipolar and monopolar montages arranged in accordance to the international 10/20 system of electrode placement. One channel was dedicated to EKG recording.  The patient is in the awake, drowsy and asleep states.  Description:  The waking background activity consists of a low voltage, symmetrical, fairly well organized, 8 Hz alpha activity, seen from the parieto-occipital and posterior temporal regions.  Low voltage fast activity, poorly organized, is seen anteriorly and is at times superimposed on more posterior regions.  A mixture of theta and alpha rhythms are seen from the central and temporal regions. The patient drowses with slowing to irregular, low voltage theta and beta activity.   The patient goes in to a light sleep with symmetrical sleep spindles, vertex central sharp transients and irregular slow activity.   No epileptiform activity is noted.   Hyperventilation and iIntermittent photic stimulation were not performed.  IMPRESSION: Normal electroencephalogram, awake and asleep. There are no focal lateralizing or epileptiform features.   Alexis Goodell, MD Neurology [phone] 12/07/2018, 9:37 AM

## 2018-12-07 NOTE — Progress Notes (Signed)
Patient admitted with syncopal versus transient global amnesia.  Work-up is continuing.  Neurology also consulted.  Patient will complete treatment and depending on how she does we will transition to oral medications.  At this point she is having headache which we will treat symptomatically.

## 2018-12-07 NOTE — Progress Notes (Signed)
2D Echocardiogram has been performed.  Jean Shaffer 12/07/2018, 1:49 PM

## 2018-12-07 NOTE — ED Notes (Signed)
Urine culture sent down with urine sample 

## 2018-12-07 NOTE — ED Notes (Signed)
ED TO INPATIENT HANDOFF REPORT  ED Nurse Name and Phone #: [phone]  Name/Age/Gender Jean Shaffer 67 y.o. female Room/Bed: H014C/H014C  Code Status   Code Status: Full Code  Home/SNF/Other Home Patient oriented to: self, place, time and situation Is this baseline? Yes   Triage Complete: Triage complete  Chief Complaint Fall,Confusion   Triage Note  Patient BIB EMS after falling at home. Patient was helping clean up at church, went home to get more cleaning supplies and fell.  Patient called her son to come help her and when he arrived she had AMS and did not remember calling him or falling.  Patient is alert but confused about what has happened during the day.  BP 218/108 in route and patient complaining of headache.  Patient is complaining of headache and neck pain 3/10.  Hx COPD, back pain, hypothyroid.   Allergies No Known Allergies  Level of Care/Admitting Diagnosis ED Disposition    ED Disposition Condition [facility]: [place] [[id number]]  Level of Care: Medical Telemetry [104]  I expect the patient will be discharged within 24 hours: Yes  LOW acuity---Tx typically complete <24 hrs---ACUTE conditions typically can be evaluated <24 hours---LABS likely to return to acceptable levels <24 hours---IS near functional baseline---EXPECTED to return to current living arrangement---NOT newly hypoxic: Meets criteria for 5C-Observation unit  Diagnosis: Syncope [206001]  Admitting Physician: Etta Quill [[id number]]  Attending Physician: Etta Quill [[id number]]  PT Class (Do Not Modify): Observation [104]  PT Acc Code (Do Not Modify): Observation [10022]       Medical/Surgery History Past Medical History:  Diagnosis Date  . Chronic back pain   . COPD (chronic obstructive pulmonary disease) ([place])   . Hypertension   . Thyroid disease    Past Surgical History:  Procedure Laterality Date  . ABDOMINAL HYSTERECTOMY    .  COLONOSCOPY    . NECK SURGERY    . SPINE SURGERY       IV Location/Drains/Wounds Patient Lines/Drains/Airways Status   Active Line/Drains/Airways    Name:   Placement date:   Placement time:   Site:   Days:   Peripheral IV 12/06/18 Left Antecubital   12/06/18    2206    Antecubital   1          Intake/Output Last 24 hours No intake or output data in the 24 hours ending 12/07/18 1259  Labs/Imaging Results for orders placed or performed during the hospital encounter of 12/06/18 (from the past 48 hour(s))  Comprehensive metabolic panel     Status: Abnormal   Collection Time: 12/06/18 10:15 PM  Result Value Ref Range   Sodium 140 135 - 145 mmol/L   Potassium 3.6 3.5 - 5.1 mmol/L   Chloride 105 98 - 111 mmol/L   CO2 25 22 - 32 mmol/L   Glucose, Bld 98 70 - 99 mg/dL   BUN 10 8 - 23 mg/dL   Creatinine, Ser 1.10 (H) 0.44 - 1.00 mg/dL   Calcium 9.3 8.9 - 10.3 mg/dL   Total Protein 7.4 6.5 - 8.1 g/dL   Albumin 3.9 3.5 - 5.0 g/dL   AST 19 15 - 41 U/L   ALT 21 0 - 44 U/L   Alkaline Phosphatase 77 38 - 126 U/L   Total Bilirubin 0.5 0.3 - 1.2 mg/dL   GFR calc non Af Amer 52 (L) >60 mL/min   GFR calc Af Amer 60 (L) >60 mL/min  Anion gap 10 5 - 15    Comment: Performed at [facility] [address] [place], [place] [address]  CBC     Status: Abnormal   Collection Time: 12/06/18 10:15 PM  Result Value Ref Range   WBC 11.7 (H) 4.0 - 10.5 K/uL   RBC 4.35 3.87 - 5.11 MIL/uL   Hemoglobin 12.0 12.0 - 15.0 g/dL   HCT 39.0 36.0 - 46.0 %   MCV 89.7 80.0 - 100.0 fL   MCH 27.6 26.0 - 34.0 pg   MCHC 30.8 30.0 - 36.0 g/dL   RDW 13.5 11.5 - 15.5 %   Platelets 304 150 - 400 K/uL   nRBC 0.0 0.0 - 0.2 %    Comment: Performed at [facility], [place] [address] [place], [place] [address]  CBG monitoring, ED     Status: None   Collection Time: 12/06/18 10:21 PM  Result Value Ref Range   Glucose-Capillary 87 70 - 99 mg/dL  Troponin I - Now Then Q6H     Status: Abnormal    Collection Time: 12/06/18 11:45 PM  Result Value Ref Range   Troponin I 0.09 (HH) <0.03 ng/mL    Comment: CRITICAL RESULT CALLED TO, READ BACK BY AND VERIFIED WITH: POWELL,A RN 12/07/2018 0140 JORDANS Performed at [facility], [place] [address] [place], [place] [address]   I-stat troponin, ED     Status: Abnormal   Collection Time: 12/07/18 12:10 AM  Result Value Ref Range   Troponin i, poc 0.10 (HH) 0.00 - 0.08 ng/mL   Comment NOTIFIED PHYSICIAN    Comment 3            Comment: Due to the release kinetics of cTnI, a negative result within the first hours of the onset of symptoms does not rule out myocardial infarction with certainty. If myocardial infarction is still suspected, repeat the test at appropriate intervals.   Urinalysis, Routine w reflex microscopic     Status: Abnormal   Collection Time: 12/07/18 12:11 AM  Result Value Ref Range   Color, Urine STRAW (A) YELLOW   APPearance CLEAR CLEAR   Specific Gravity, Urine 1.005 1.005 - 1.030   pH 7.0 5.0 - 8.0   Glucose, UA NEGATIVE NEGATIVE mg/dL   Hgb urine dipstick NEGATIVE NEGATIVE   Bilirubin Urine NEGATIVE NEGATIVE   Ketones, ur NEGATIVE NEGATIVE mg/dL   Protein, ur NEGATIVE NEGATIVE mg/dL   Nitrite NEGATIVE NEGATIVE   Leukocytes, UA TRACE (A) NEGATIVE   RBC / HPF 0-5 0 - 5 RBC/hpf   WBC, UA 0-5 0 - 5 WBC/hpf   Bacteria, UA NONE SEEN NONE SEEN    Comment: Performed at [facility] [address], [place], [place] [address]  TSH     Status: None   Collection Time: 12/07/18  4:27 AM  Result Value Ref Range   TSH 2.943 0.350 - 4.500 uIU/mL    Comment: Performed by a 3rd Generation assay with a functional sensitivity of <=0.01 uIU/mL. Performed at [facility], [place] [address] [place], [place] [address]   Troponin I - Now Then Q6H     Status: Abnormal   Collection Time: 12/07/18  6:18 AM  Result Value Ref Range   Troponin I 0.15 (HH) <0.03 ng/mL    Comment: CRITICAL VALUE NOTED.  VALUE IS  CONSISTENT WITH PREVIOUSLY REPORTED AND CALLED VALUE. Performed at [facility], [place] [address], [place], [place] [address]  CBG monitoring, ED     Status: None   Collection Time: 12/07/18  7:34 AM  Result Value Ref Range   Glucose-Capillary 94 70 - 99 mg/dL   Comment 1 Notify RN    Comment 2 Document in Chart    X-ray Chest Pa And Lateral  Result Date: 12/07/2018 CLINICAL DATA:  Syncope. EXAM: CHEST - 2 VIEW COMPARISON:  09/17/2017 FINDINGS: The cardiomediastinal contours are normal. The lungs are clear. Pulmonary vasculature is normal. No consolidation, pleural effusion, or pneumothorax. No acute osseous abnormalities are seen. Surgical hardware in the lower cervical spine is partially included. IMPRESSION: No acute pulmonary process. Electronically Signed   By: Keith Rake M.D.   On: 12/07/2018 05:02   Ct Head Wo Contrast  Result Date: 12/06/2018 CLINICAL DATA:  [place] and hit head headache with neck pain EXAM: CT HEAD WITHOUT CONTRAST CT CERVICAL SPINE WITHOUT CONTRAST TECHNIQUE: Multidetector CT imaging of the head and cervical spine was performed following the standard protocol without intravenous contrast. Multiplanar CT image reconstructions of the cervical spine were also generated. COMPARISON:  CT 02/10/2015 FINDINGS: CT HEAD FINDINGS Brain: No acute territorial infarction, hemorrhage or intracranial mass. Mild hypodensity in the subcortical white matter likely small vessel ischemic change. Stable ventricle size. Vascular: No hyperdense vessels. Scattered calcifications at the carotid siphon. Skull: Normal. Negative for fracture or focal lesion. Sinuses/Orbits: No acute finding. Other: None CT CERVICAL SPINE FINDINGS Alignment: No subluxation.  Facet alignment within normal limits. Skull base and vertebrae: No acute fracture. No primary bone lesion or focal pathologic process. Soft tissues and spinal canal: No prevertebral fluid or swelling. No visible canal hematoma. Disc  levels: Anterior plate and screw fixation with solid bone fusion C4 through C6. Moderate degenerative change at C2-C3, C3-C4 and mild degenerative change C6-C7. Upper chest: Subcentimeter hypodense nodule right lobe of thyroid. Apices clear Other: None IMPRESSION: 1. No CT evidence for acute intracranial abnormality. Mild hypodensity in the white matter suggesting small vessel ischemic change 2. Postsurgical changes C4 through C6. No acute osseous abnormality. Electronically Signed   By: Donavan Foil M.D.   On: 12/06/2018 23:43   Ct Cervical Spine Wo Contrast  Result Date: 12/06/2018 CLINICAL DATA:  [place] and hit head headache with neck pain EXAM: CT HEAD WITHOUT CONTRAST CT CERVICAL SPINE WITHOUT CONTRAST TECHNIQUE: Multidetector CT imaging of the head and cervical spine was performed following the standard protocol without intravenous contrast. Multiplanar CT image reconstructions of the cervical spine were also generated. COMPARISON:  CT 02/10/2015 FINDINGS: CT HEAD FINDINGS Brain: No acute territorial infarction, hemorrhage or intracranial mass. Mild hypodensity in the subcortical white matter likely small vessel ischemic change. Stable ventricle size. Vascular: No hyperdense vessels. Scattered calcifications at the carotid siphon. Skull: Normal. Negative for fracture or focal lesion. Sinuses/Orbits: No acute finding. Other: None CT CERVICAL SPINE FINDINGS Alignment: No subluxation.  Facet alignment within normal limits. Skull base and vertebrae: No acute fracture. No primary bone lesion or focal pathologic process. Soft tissues and spinal canal: No prevertebral fluid or swelling. No visible canal hematoma. Disc levels: Anterior plate and screw fixation with solid bone fusion C4 through C6. Moderate degenerative change at C2-C3, C3-C4 and mild degenerative change C6-C7. Upper chest: Subcentimeter hypodense nodule right lobe of thyroid. Apices clear Other: None IMPRESSION: 1. No CT evidence for acute  intracranial abnormality. Mild hypodensity in the white matter suggesting small vessel ischemic change 2. Postsurgical changes C4 through C6. No acute osseous abnormality. Electronically Signed   By: Maudie Mercury  Francoise Ceo M.D.   On: 12/06/2018 23:43   Mr Brain Wo Contrast  Result Date: 12/07/2018 CLINICAL DATA:  Transient global amnesia. Suspect stroke. History of hypertension. EXAM: MRI HEAD WITHOUT CONTRAST TECHNIQUE: Multiplanar, multiecho pulse sequences of the brain and surrounding structures were obtained without intravenous contrast. COMPARISON:  CT HEAD December 06, 2018 FINDINGS: INTRACRANIAL CONTENTS: No reduced diffusion to suggest acute ischemia. No susceptibility artifact to suggest hemorrhage. The ventricles and sulci are normal for patient's age. Patchy supratentorial white matter FLAIR T2 hyperintensities. Small area LEFT frontal encephalomalacia. No suspicious parenchymal signal, masses, mass effect. No abnormal extra-axial fluid collections. No extra-axial masses. VASCULAR: Normal major intracranial vascular flow voids present at skull base. SKULL AND UPPER CERVICAL SPINE: No abnormal sellar expansion. No suspicious calvarial bone marrow signal. ACDF. Craniocervical junction maintained. SINUSES/ORBITS: The mastoid air-cells and included paranasal sinuses are well-aerated.The included ocular globes and orbital contents are non-suspicious. Status post bilateral ocular lens implants. OTHER: None. IMPRESSION: 1. No acute intracranial process. 2. Mild-to-moderate chronic small vessel ischemic changes. 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 Electronically Signed   By: Elon Alas M.D.   On: 12/07/2018 05:35   Dg Knee Complete 4 Views Right  Result Date: 12/06/2018 CLINICAL DATA:  Fall.  Confusion.  Trauma. EXAM: RIGHT KNEE - COMPLETE 4+ VIEW COMPARISON:  None. FINDINGS: Patellar spurring at the quadriceps and patellar tendon attachment sites. No appreciable fracture or knee effusion.  Otherwise, no significant abnormalities are observed. IMPRESSION: 1. Mild patellar spurring. Otherwise, no significant abnormalities are observed. Electronically Signed   By: Van Clines M.D.   On: 12/06/2018 23:31    Pending Labs Unresulted Labs (From admission, onward)    Start     Ordered   12/07/18 0349  HIV antibody (Routine Testing)  Once,   R     12/07/18 0352   12/07/18 0041  Troponin I - Now Then Q6H  Now then every 6 hours,   R     12/07/18 0040          Vitals/Pain Today's Vitals   12/07/18 1103 12/07/18 1125 12/07/18 1200 12/07/18 1242  BP: (!) 109/51   134/66  Pulse: (!) 59   61  Resp: 13   17  Temp:    98.1 F (36.7 C)  TempSrc:    Oral  SpO2: 96%   100%  Weight:   63.5 kg   Height:   5\' 4"  (1.626 m)   PainSc:  0-No pain      Isolation Precautions No active isolations  Medications Medications  sodium chloride flush (NS) 0.9 % injection 3 mL (3 mLs Intravenous Given 12/07/18 1110)  enoxaparin (LOVENOX) injection 40 mg (has no administration in time range)  HYDROcodone-acetaminophen (NORCO/VICODIN) 5-325 MG per tablet 1-2 tablet (1 tablet Oral Given 12/07/18 0650)  fentaNYL (SUBLIMAZE) injection 50 mcg (50 mcg Intravenous Given 12/07/18 0029)    Mobility walks with person assist High fall risk   Focused Assessments Neuro Assessment Handoff:  Swallow screen pass? Yes  Cardiac Rhythm: Normal sinus rhythm NIH Stroke Scale ( + Modified Stroke Scale Criteria)  Interval: Initial Level of Consciousness (1a.)   : Alert, keenly responsive LOC Questions (1b. )   +: Answers both questions correctly LOC Commands (1c. )   + : Performs both tasks correctly Best Gaze (2. )  +: Normal Visual (3. )  +: No visual loss Facial Palsy (4. )    : Normal symmetrical movements Motor Arm, Left (5a. )   +:  No drift Motor Arm, Right (5b. )   +: No drift Motor Leg, Left (6a. )   +: No drift Motor Leg, Right (6b. )   +: No drift Limb Ataxia (7. ): Absent Sensory  (8. )   +: Normal, no sensory loss Best Language (9. )   +: No aphasia Dysarthria (10. ): Normal Extinction/Inattention (11.)   +: No Abnormality Modified SS Total  +: 0 Complete NIHSS TOTAL: 0     Neuro Assessment: Exceptions to WDL Neuro Checks:   Initial (12/06/18 2217)  Last Documented NIHSS Modified Score: 0 (12/06/18 2217) Has TPA been given? No If patient is a Neuro Trauma and patient is going to OR before floor call report to [place] nurse: [phone] or [phone]     Recommendations: See Admitting Provider Note  Report given to: Suezanne Jacquet, RN  Additional Notes: patient is alert and oriented x4, memory loss related to fall event. States no history of cardiac problems other than a heart murmur she had as a child. Patient denies chest pain of any kind.

## 2018-12-08 ENCOUNTER — Observation Stay (HOSPITAL_BASED_OUTPATIENT_CLINIC_OR_DEPARTMENT_OTHER): Payer: Medicare Other

## 2018-12-08 DIAGNOSIS — R55 Syncope and collapse: Secondary | ICD-10-CM | POA: Diagnosis not present

## 2018-12-08 DIAGNOSIS — I10 Essential (primary) hypertension: Secondary | ICD-10-CM | POA: Diagnosis not present

## 2018-12-08 DIAGNOSIS — J449 Chronic obstructive pulmonary disease, unspecified: Secondary | ICD-10-CM | POA: Diagnosis not present

## 2018-12-08 MED ORDER — CLONIDINE HCL 0.1 MG PO TABS
0.1000 mg | ORAL_TABLET | Freq: Once | ORAL | Status: AC
  Administered 2018-12-08: 0.1 mg via ORAL
  Filled 2018-12-08: qty 1

## 2018-12-08 NOTE — Progress Notes (Signed)
NT informed the nurse that the patients BP was 193/83. Did a manual blood pressure and found the blood pressure to be 198/100. MD notified.

## 2018-12-08 NOTE — Progress Notes (Signed)
Carotid artery duplex completed. Refer to "CV Proc" under chart review to view preliminary results.  12/08/2018 1:58 PM Maudry Mayhew, MHA, RVT, RDCS, RDMS

## 2018-12-08 NOTE — Care Management Obs Status (Signed)
[facility] NOTIFICATION   Patient Details  Name: GAYNOR GENCO MRN: [id number] Date of Birth: 08/10/1951   Medicare Observation Status Notification Given:  Yes    Carles Collet, RN 12/08/2018, 2:46 PM

## 2018-12-08 NOTE — Discharge Summary (Signed)
Physician Discharge Summary  Jean Shaffer JSH:[id number] DOB: 10-28-1951 DOA: 12/06/2018  PCP: [name], MD  Admit date: 12/06/2018 Discharge date: 12/08/2018  Time spent: 34 minutes  Recommendations for Outpatient Follow-up:  1. Follow-up with primary care physician 2. Monitor blood pressure patterns and record them for your primary care physician   Discharge Diagnoses:  Principal Problem:   Syncope Active Problems:   HTN (hypertension)   COPD (chronic obstructive pulmonary disease) ([place])   Discharge Condition: Good  Diet recommendation: Cardiac diet  Filed Weights   12/06/18 2213 12/07/18 1200  Weight: 63.5 kg 63.5 kg    History of present illness:  Jean Shaffer is a 67 y.o. female with medical history significant of HTN, COPD.  Patient presents to the ED after episode of amnesia.  Patient was helping clean up at church, went home to get more cleaning supplies and fell. Patient called her son to come help her and when he arrived she had AMS and did not remember calling him or falling.  Hospital Course:  Patient was admitted to the hospital and initiated on work-up for possible CVA.  She has labile hypertension which may be playing a role.  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  No acute CVA.  Evaluated by physical therapy.  Her blood pressure remains controlled until today when he spiked to systolic of 850.  She was given a dose of clonidine.  Patient may need ambulatory blood pressure monitoring as her blood pressure appears to be up and down most of the time.  Work-up including carotid Dopplers and echocardiogram appeared normal.  At this point she is recommended to continue on aspirin and statin for possible TIA.  Also keep her blood pressure under tight control at home.  Follow-up with her primary care physician  Procedures:  Echocardiogram showed ejection fraction 55 to 60%.  No other significant abnormality.    Carotid  Doppler ultrasound showed no significant plaque or stenosis bilaterally.   Consultations:  None  Discharge Exam: Vitals:   12/07/18 2300 12/08/18 0816  BP: (!) 175/76 139/62  Pulse: 66 83  Resp: 16 18  Temp: 98.6 F (37 C) 98.5 F (36.9 C)  SpO2: 95% 97%    General: Stable with no acute distress Cardiovascular:  regular rate and rhythm Respiratory: Good air entry bilaterally with no significant wheeze rales or crackles  Discharge Instructions   Discharge Instructions    Diet - low sodium heart healthy   Complete by:  As directed    Diet - low sodium heart healthy   Complete by:  As directed    Increase activity slowly   Complete by:  As directed    Increase activity slowly   Complete by:  As directed      Allergies as of 12/08/2018   No Known Allergies     Medication List    TAKE these medications   albuterol 108 (90 Base) MCG/ACT inhaler Commonly known as:  PROVENTIL HFA;VENTOLIN HFA Inhale 2 puffs into the lungs every 4 (four) hours as needed for shortness of breath or wheezing.   aspirin 81 MG tablet Take 81 mg by mouth every morning.   buPROPion 150 MG 24 hr tablet Commonly known as:  WELLBUTRIN XL Take 150 mg by mouth every morning.   cholecalciferol 25 MCG (1000 UT) tablet Commonly known as:  VITAMIN D Take 1,000 Units by mouth daily.   diclofenac sodium 1 % Gel  Commonly known as:  VOLTAREN Apply 2 g topically 4 (four) times daily as needed (knee pain).   HYDROcodone-acetaminophen 5-325 MG tablet Commonly known as:  NORCO/VICODIN Take 1-2 tablets by mouth every 6 hours as needed for pain.   ibuprofen 200 MG tablet Commonly known as:  ADVIL,MOTRIN Take 200 mg by mouth every 6 (six) hours as needed for pain.   levothyroxine 50 MCG tablet Commonly known as:  SYNTHROID, LEVOTHROID Take 50 mcg by mouth every morning.   meloxicam 15 MG tablet Commonly known as:  MOBIC Take 15 mg by mouth daily as needed (arthritic pain).   SPIRIVA  HANDIHALER 18 MCG inhalation capsule Generic drug:  tiotropium Take 1 puff by mouth daily.   traMADol 50 MG tablet Commonly known as:  ULTRAM Take 50 mg by mouth every 6 (six) hours as needed for pain.   traZODone 100 MG tablet Commonly known as:  DESYREL Take 100 mg by mouth at bedtime.   valACYclovir 500 MG tablet Commonly known as:  VALTREX Take 500 mg by mouth See admin instructions. Take 1 tablet by mouth twice daily for 3 days during outbreak      No Known Allergies    The results of significant diagnostics from this hospitalization (including imaging, microbiology, ancillary and laboratory) are listed below for reference.    Significant Diagnostic Studies: X-ray Chest Pa And Lateral  Result Date: 12/07/2018 CLINICAL DATA:  Syncope. EXAM: CHEST - 2 VIEW COMPARISON:  09/17/2017 FINDINGS: The cardiomediastinal contours are normal. The lungs are clear. Pulmonary vasculature is normal. No consolidation, pleural effusion, or pneumothorax. No acute osseous abnormalities are seen. Surgical hardware in the lower cervical spine is partially included. IMPRESSION: No acute pulmonary process. Electronically Signed   By: Keith Rake M.D.   On: 12/07/2018 05:02   Ct Head Wo Contrast  Result Date: 12/06/2018 CLINICAL DATA:  [place] and hit head headache with neck pain EXAM: CT HEAD WITHOUT CONTRAST CT CERVICAL SPINE WITHOUT CONTRAST TECHNIQUE: Multidetector CT imaging of the head and cervical spine was performed following the standard protocol without intravenous contrast. Multiplanar CT image reconstructions of the cervical spine were also generated. COMPARISON:  CT 02/10/2015 FINDINGS: CT HEAD FINDINGS Brain: No acute territorial infarction, hemorrhage or intracranial mass. Mild hypodensity in the subcortical white matter likely small vessel ischemic change. Stable ventricle size. Vascular: No hyperdense vessels. Scattered calcifications at the carotid siphon. Skull: Normal. Negative for  fracture or focal lesion. Sinuses/Orbits: No acute finding. Other: None CT CERVICAL SPINE FINDINGS Alignment: No subluxation.  Facet alignment within normal limits. Skull base and vertebrae: No acute fracture. No primary bone lesion or focal pathologic process. Soft tissues and spinal canal: No prevertebral fluid or swelling. No visible canal hematoma. Disc levels: Anterior plate and screw fixation with solid bone fusion C4 through C6. Moderate degenerative change at C2-C3, C3-C4 and mild degenerative change C6-C7. Upper chest: Subcentimeter hypodense nodule right lobe of thyroid. Apices clear Other: None IMPRESSION: 1. No CT evidence for acute intracranial abnormality. Mild hypodensity in the white matter suggesting small vessel ischemic change 2. Postsurgical changes C4 through C6. No acute osseous abnormality. Electronically Signed   By: Donavan Foil M.D.   On: 12/06/2018 23:43   Ct Cervical Spine Wo Contrast  Result Date: 12/06/2018 CLINICAL DATA:  [place] and hit head headache with neck pain EXAM: CT HEAD WITHOUT CONTRAST CT CERVICAL SPINE WITHOUT CONTRAST TECHNIQUE: Multidetector CT imaging of the head and cervical spine was performed following the standard protocol without intravenous  contrast. Multiplanar CT image reconstructions of the cervical spine were also generated. COMPARISON:  CT 02/10/2015 FINDINGS: CT HEAD FINDINGS Brain: No acute territorial infarction, hemorrhage or intracranial mass. Mild hypodensity in the subcortical white matter likely small vessel ischemic change. Stable ventricle size. Vascular: No hyperdense vessels. Scattered calcifications at the carotid siphon. Skull: Normal. Negative for fracture or focal lesion. Sinuses/Orbits: No acute finding. Other: None CT CERVICAL SPINE FINDINGS Alignment: No subluxation.  Facet alignment within normal limits. Skull base and vertebrae: No acute fracture. No primary bone lesion or focal pathologic process. Soft tissues and spinal canal: No  prevertebral fluid or swelling. No visible canal hematoma. Disc levels: Anterior plate and screw fixation with solid bone fusion C4 through C6. Moderate degenerative change at C2-C3, C3-C4 and mild degenerative change C6-C7. Upper chest: Subcentimeter hypodense nodule right lobe of thyroid. Apices clear Other: None IMPRESSION: 1. No CT evidence for acute intracranial abnormality. Mild hypodensity in the white matter suggesting small vessel ischemic change 2. Postsurgical changes C4 through C6. No acute osseous abnormality. Electronically Signed   By: Donavan Foil M.D.   On: 12/06/2018 23:43   Mr Brain Wo Contrast  Result Date: 12/07/2018 CLINICAL DATA:  Transient global amnesia. Suspect stroke. History of hypertension. EXAM: MRI HEAD WITHOUT CONTRAST TECHNIQUE: Multiplanar, multiecho pulse sequences of the brain and surrounding structures were obtained without intravenous contrast. COMPARISON:  CT HEAD December 06, 2018 FINDINGS: INTRACRANIAL CONTENTS: No reduced diffusion to suggest acute ischemia. No susceptibility artifact to suggest hemorrhage. The ventricles and sulci are normal for patient's age. Patchy supratentorial white matter FLAIR T2 hyperintensities. Small area LEFT frontal encephalomalacia. No suspicious parenchymal signal, masses, mass effect. No abnormal extra-axial fluid collections. No extra-axial masses. VASCULAR: Normal major intracranial vascular flow voids present at skull base. SKULL AND UPPER CERVICAL SPINE: No abnormal sellar expansion. No suspicious calvarial bone marrow signal. ACDF. Craniocervical junction maintained. SINUSES/ORBITS: The mastoid air-cells and included paranasal sinuses are well-aerated.The included ocular globes and orbital contents are non-suspicious. Status post bilateral ocular lens implants. OTHER: None. IMPRESSION: 1. No acute intracranial process. 2. Mild-to-moderate chronic small vessel ischemic changes. 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  Electronically Signed   By: Elon Alas M.D.   On: 12/07/2018 05:35   Dg Knee Complete 4 Views Right  Result Date: 12/06/2018 CLINICAL DATA:  Fall.  Confusion.  Trauma. EXAM: RIGHT KNEE - COMPLETE 4+ VIEW COMPARISON:  None. FINDINGS: Patellar spurring at the quadriceps and patellar tendon attachment sites. No appreciable fracture or knee effusion. Otherwise, no significant abnormalities are observed. IMPRESSION: 1. Mild patellar spurring. Otherwise, no significant abnormalities are observed. Electronically Signed   By: Van Clines M.D.   On: 12/06/2018 23:31   Vas US Carotid  Result Date: 12/08/2018 Carotid Arterial Duplex Study Indications:  Syncope. Risk Factors: Hypertension. Performing Technologist: Maudry Mayhew MHA, RDMS, RVT, RDCS  Examination Guidelines: A complete evaluation includes B-mode imaging, spectral Doppler, color Doppler, and power Doppler as needed of all accessible portions of each vessel. Bilateral testing is considered an integral part of a complete examination. Limited examinations for reoccurring indications may be performed as noted.  Right Carotid Findings: +----------+--------+--------+--------+-----------------------+--------+           PSV cm/sEDV cm/sStenosisDescribe               Comments +----------+--------+--------+--------+-----------------------+--------+ CCA Prox  65      13                                              +----------+--------+--------+--------+-----------------------+--------+  CCA Distal54      12              heterogenous and smooth         +----------+--------+--------+--------+-----------------------+--------+ ICA Prox  67      12              smooth and heterogenous         +----------+--------+--------+--------+-----------------------+--------+ ICA Distal72      18                                              +----------+--------+--------+--------+-----------------------+--------+ ECA       64       6                                               +----------+--------+--------+--------+-----------------------+--------+ +----------+--------+-------+----------------+-------------------+           PSV cm/sEDV cmsDescribe        Arm Pressure (mmHG) +----------+--------+-------+----------------+-------------------+ [id number]             Multiphasic, WNL                    +----------+--------+-------+----------------+-------------------+ +---------+--------+--+--------+--+---------+ VertebralPSV cm/s76EDV cm/s20Antegrade +---------+--------+--+--------+--+---------+  Left Carotid Findings: +----------+--------+-------+--------+----------------------+------------------+           PSV cm/sEDV    StenosisDescribe              Comments                             cm/s                                                    +----------+--------+-------+--------+----------------------+------------------+ CCA Prox  86      16                                                      +----------+--------+-------+--------+----------------------+------------------+ CCA Distal67      17                                   intimal thickening +----------+--------+-------+--------+----------------------+------------------+ ICA Prox  69      23                                                      +----------+--------+-------+--------+----------------------+------------------+ ICA Distal96      29                                                      +----------+--------+-------+--------+----------------------+------------------+  ECA       73      9              smooth and                                                                heterogenous                             +----------+--------+-------+--------+----------------------+------------------+ +----------+--------+--------+----------------+-------------------+ SubclavianPSV cm/sEDV cm/sDescribe         Arm Pressure (mmHG) +----------+--------+--------+----------------+-------------------+           126             Multiphasic, WNL                    +----------+--------+--------+----------------+-------------------+ +---------+--------+--+--------+-+---------+ VertebralPSV cm/s47EDV cm/s8Antegrade +---------+--------+--+--------+-+---------+  Summary: Right Carotid: Velocities in the right ICA are consistent with a 1-39% stenosis. Left Carotid: Velocities in the left ICA are consistent with a 1-39% stenosis. Vertebrals:  Bilateral vertebral arteries demonstrate antegrade flow. Subclavians: Normal flow hemodynamics were seen in bilateral subclavian              arteries. *See table(s) above for measurements and observations.     Preliminary     Microbiology: No results found for this or any previous visit (from the past 240 hour(s)).   Labs: Basic Metabolic Panel: Recent Labs  Lab 12/06/18 2215  NA 140  K 3.6  CL 105  CO2 25  GLUCOSE 98  BUN 10  CREATININE 1.10*  CALCIUM 9.3   Liver Function Tests: Recent Labs  Lab 12/06/18 2215  AST 19  ALT 21  ALKPHOS 77  BILITOT 0.5  PROT 7.4  ALBUMIN 3.9   No results for input(s): LIPASE, AMYLASE in the last 168 hours. No results for input(s): AMMONIA in the last 168 hours. CBC: Recent Labs  Lab 12/06/18 2215  WBC 11.7*  HGB 12.0  HCT 39.0  MCV 89.7  PLT 304   Cardiac Enzymes: Recent Labs  Lab 12/06/18 2345 12/07/18 0618 12/07/18 1232  TROPONINI 0.09* 0.15* 0.05*   BNP: BNP (last 3 results) No results for input(s): BNP in the last 8760 hours.  ProBNP (last 3 results) No results for input(s): PROBNP in the last 8760 hours.  CBG: Recent Labs  Lab 12/06/18 2221 12/07/18 0734  GLUCAP 87 94       Signed:  Grantland Want,LAWAL MD.  [facility] 12/08/2018, 2:55 PM

## 2018-12-08 NOTE — Progress Notes (Signed)
   12/08/18 1055  Clinical Encounter Type  Visited With Patient  Visit Type Initial;Spiritual support;Other (Comment) (AD)  Referral From Nurse  Consult/Referral To Chaplain  The chaplain responded to spiritual care consult for AD and prayer.  The Pt. greeted the chaplain with a smile and the details she remembered before her hospital admission. The Pt. faithfully shared her relationships with her family and church.  The Pt. will look over AD educational document.  The chaplain and Pt. prayed together before chaplain exited. The chaplain will F/U with spiritual care visit.

## 2018-12-13 ENCOUNTER — Emergency Department: Payer: Medicare Other

## 2018-12-13 ENCOUNTER — Emergency Department
Admission: EM | Admit: 2018-12-13 | Discharge: 2018-12-13 | Disposition: A | Discharge status: Home / Self Care | Payer: Medicare Other | Attending: Emergency Medicine | Admitting: Emergency Medicine

## 2018-12-13 ENCOUNTER — Other Ambulatory Visit: Payer: Self-pay

## 2018-12-13 ENCOUNTER — Encounter: Payer: Self-pay | Admitting: Emergency Medicine

## 2018-12-13 DIAGNOSIS — I10 Essential (primary) hypertension: Secondary | ICD-10-CM | POA: Diagnosis not present

## 2018-12-13 DIAGNOSIS — Z79899 Other long term (current) drug therapy: Secondary | ICD-10-CM | POA: Insufficient documentation

## 2018-12-13 DIAGNOSIS — R42 Dizziness and giddiness: Secondary | ICD-10-CM | POA: Diagnosis present

## 2018-12-13 DIAGNOSIS — Z87891 Personal history of nicotine dependence: Secondary | ICD-10-CM | POA: Insufficient documentation

## 2018-12-13 DIAGNOSIS — Z7982 Long term (current) use of aspirin: Secondary | ICD-10-CM | POA: Diagnosis not present

## 2018-12-13 DIAGNOSIS — J449 Chronic obstructive pulmonary disease, unspecified: Secondary | ICD-10-CM | POA: Diagnosis not present

## 2018-12-13 LAB — CBC WITH DIFFERENTIAL/PLATELET
Abs Immature Granulocytes: 0.03 10*3/uL (ref 0.00–0.07)
Basophils Absolute: 0.1 10*3/uL (ref 0.0–0.1)
Basophils Relative: 1 %
Eosinophils Absolute: 0.1 10*3/uL (ref 0.0–0.5)
Eosinophils Relative: 1 %
HCT: 37.2 % (ref 36.0–46.0)
Hemoglobin: 11.8 g/dL — ABNORMAL LOW (ref 12.0–15.0)
Immature Granulocytes: 0 %
Lymphocytes Relative: 30 %
Lymphs Abs: 2.5 10*3/uL (ref 0.7–4.0)
MCH: 28.3 pg (ref 26.0–34.0)
MCHC: 31.7 g/dL (ref 30.0–36.0)
MCV: 89.2 fL (ref 80.0–100.0)
Monocytes Absolute: 0.6 10*3/uL (ref 0.1–1.0)
Monocytes Relative: 7 %
Neutro Abs: 5.2 10*3/uL (ref 1.7–7.7)
Neutrophils Relative %: 61 %
Platelets: 300 10*3/uL (ref 150–400)
RBC: 4.17 MIL/uL (ref 3.87–5.11)
RDW: 13.5 % (ref 11.5–15.5)
WBC: 8.5 10*3/uL (ref 4.0–10.5)
nRBC: 0 % (ref 0.0–0.2)

## 2018-12-13 LAB — URINE DRUG SCREEN, QUALITATIVE (ARMC ONLY)
Amphetamines, Ur Screen: NOT DETECTED
Barbiturates, Ur Screen: NOT DETECTED
Benzodiazepine, Ur Scrn: NOT DETECTED
Cannabinoid 50 Ng, Ur ~~LOC~~: NOT DETECTED
Cocaine Metabolite,Ur ~~LOC~~: NOT DETECTED
MDMA (Ecstasy)Ur Screen: NOT DETECTED
Methadone Scn, Ur: NOT DETECTED
Opiate, Ur Screen: POSITIVE — AB
Phencyclidine (PCP) Ur S: NOT DETECTED
Tricyclic, Ur Screen: NOT DETECTED

## 2018-12-13 LAB — ETHANOL: Alcohol, Ethyl (B): <10 mg/dL (ref <10)

## 2018-12-13 LAB — BASIC METABOLIC PANEL
Anion gap: 7 (ref 5–15)
BUN: 16 mg/dL (ref 8–23)
CO2: 26 mmol/L (ref 22–32)
Calcium: 9.2 mg/dL (ref 8.9–10.3)
Chloride: 106 mmol/L (ref 98–111)
Creatinine, Ser: 1.01 mg/dL — ABNORMAL HIGH (ref 0.44–1.00)
GFR calc Af Amer: >60 mL/min (ref >60)
GFR calc non Af Amer: 57 mL/min — ABNORMAL LOW (ref >60)
Glucose, Bld: 96 mg/dL (ref 70–99)
Potassium: 4.2 mmol/L (ref 3.5–5.1)
Sodium: 139 mmol/L (ref 135–145)

## 2018-12-13 LAB — TROPONIN I: Troponin I: <0.03 ng/mL (ref <0.03)

## 2018-12-13 LAB — URINALYSIS, COMPLETE (UACMP) WITH MICROSCOPIC
Bilirubin Urine: NEGATIVE
Glucose, UA: NEGATIVE mg/dL
Hgb urine dipstick: NEGATIVE
Ketones, ur: NEGATIVE mg/dL
Nitrite: NEGATIVE
Protein, ur: NEGATIVE mg/dL
Specific Gravity, Urine: 1.014 (ref 1.005–1.030)
pH: 5 (ref 5.0–8.0)

## 2018-12-13 LAB — GLUCOSE, CAPILLARY: Glucose-Capillary: 95 mg/dL (ref 70–99)

## 2018-12-13 MED ORDER — METOPROLOL TARTRATE 50 MG PO TABS
50.0000 mg | ORAL_TABLET | Freq: Once | ORAL | Status: AC
  Administered 2018-12-13: 50 mg via ORAL
  Filled 2018-12-13: qty 1

## 2018-12-13 MED ORDER — METOPROLOL TARTRATE 25 MG PO TABS: 25.0000 mg | ORAL_TABLET | Freq: Two times a day (BID) | ORAL | 0 refills | Status: DC

## 2018-12-13 NOTE — ED Notes (Signed)
Red top tube sent to lab

## 2018-12-13 NOTE — Discharge Instructions (Signed)
Prefer not to be admitted to the hospital, this is certainly her choice but as we discussed it does limit her ability to observe you.  If you feel worse in any way, as discussed, return to the emergency room.  Otherwise please follow closely with primary care doctor as already scheduled on Monday or return to the emergency room

## 2018-12-13 NOTE — ED Notes (Signed)
Pt up to toilet 

## 2018-12-13 NOTE — ED Triage Notes (Signed)
HTN for the past few days, was dc'd from hosp this past Sunday for stroke like symptoms, states her vision has "changed," in the past few days-more blurry in her left eye. MRI showed "old stroke,", bilateral grips strong and equal, no numbness or tingling, face symmetrical, speech clear.

## 2018-12-13 NOTE — ED Notes (Signed)
No head CT at this time, until MD evaluation.

## 2018-12-13 NOTE — ED Notes (Signed)
Pt gone to US

## 2018-12-13 NOTE — ED Triage Notes (Signed)
Son states she fell last Friday, however pt doesn't remember the fall at all.

## 2018-12-13 NOTE — ED Provider Notes (Addendum)
[facility] Emergency Department Provider Note  ____________________________________________   I have reviewed the triage vital signs and the nursing notes. Where available I have reviewed prior notes and, if possible and indicated, outside hospital notes.    HISTORY  Chief Complaint Dizziness and Blurred Vision    HPI Jean Shaffer is a 67 y.o. female with a history of chronic back pain COPD and hypertension not on any medications, was recently seen and discharged from [facility] facility after an extensive work-up for a amnestic event.  Is not clear exactly what happened.  Patient states since she was initially worked up she is always felt a little bit blurry in both her eyes, and a little "groggy".  Patient however did have a negative MRI for any acute pathology and negative CT scan negative EEG negative Dopplers negative echo and reassuring blood work.  The only thing they found during that stay which ended a few days ago was a propensity towards elevated blood pressure.  They deferred to primary care doctor for blood pressure checks.  Patient has been checking her blood pressure at home BID she states in the mornings it is in the 170s to 180s and sometimes higher and in the afternoon goes down to the 140s.  She used to be on blood pressure medications years ago the family doctor took her off.  She has no focal numbness or weakness no headache she does not want a CT scan.  She want something for her blood pressure.  She states she feels the same as she has been feeling for the last week despite all those tests.  She denies any true change in vision she states sometimes her vision feels like it might be blurry but this is just because she does not feel well.  She denies any scotoma she denies any cochlea.  States she feels the same as she has been for the last week.  It was noted prior to her discharge that her blood pressure was repeatedly spiking and she received  clonidine prior to discharge, but they did defer starting her on new blood pressure medications.  In any event, she is not taking any.  She states that she adamantly does not want to be admitted to the hospital again she states "I already done that", but she would like some blood pressure medication and she wants to be rechecked to make sure nothing is getting worse.  Patient states she is also under stress because of the holidays.  Past Medical History:  Diagnosis Date  . Chronic back pain   . COPD (chronic obstructive pulmonary disease) ([place])   . Hypertension   . Thyroid disease     Patient Active Problem List   Diagnosis Date Noted  . Syncope 12/07/2018  . HTN (hypertension) 12/07/2018  . COPD (chronic obstructive pulmonary disease) ([place]) 12/07/2018    Past Surgical History:  Procedure Laterality Date  . ABDOMINAL HYSTERECTOMY    . COLONOSCOPY    . NECK SURGERY    . SPINE SURGERY      Prior to Admission medications   Medication Sig Start Date End Date Taking? Authorizing Provider  albuterol (PROVENTIL HFA;VENTOLIN HFA) 108 (90 Base) MCG/ACT inhaler Inhale 2 puffs into the lungs every 4 (four) hours as needed for shortness of breath or wheezing. 07/30/15   [provider]  aspirin 81 MG tablet Take 81 mg by mouth every morning.    [provider]  buPROPion (WELLBUTRIN XL) 150 MG  24 hr tablet Take 150 mg by mouth every morning. 09/11/18   [provider]  cholecalciferol (VITAMIN D) 25 MCG (1000 UT) tablet Take 1,000 Units by mouth daily.    [provider]  diclofenac sodium (VOLTAREN) 1 % GEL Apply 2 g topically 4 (four) times daily as needed (knee pain).  07/30/15   [provider]  HYDROcodone-acetaminophen (NORCO/VICODIN) 5-325 MG per tablet Take 1-2 tablets by mouth every 6 hours as needed for pain. 02/10/15   Pisciotta, Elmyra Ricks, PA-C  ibuprofen (ADVIL,MOTRIN) 200 MG tablet Take 200 mg by mouth every 6 (six) hours as needed for pain.     [provider]  levothyroxine (SYNTHROID, LEVOTHROID) 50 MCG tablet Take 50 mcg by mouth every morning. 09/06/18   [provider]  meloxicam (MOBIC) 15 MG tablet Take 15 mg by mouth daily as needed (arthritic pain).  04/04/18   [provider]  tiotropium (SPIRIVA HANDIHALER) 18 MCG inhalation capsule Take 1 puff by mouth daily. 10/08/18   [provider]  traMADol (ULTRAM) 50 MG tablet Take 50 mg by mouth every 6 (six) hours as needed for pain.    [provider]  traZODone (DESYREL) 100 MG tablet Take 100 mg by mouth at bedtime. 03/01/18   [provider]  valACYclovir (VALTREX) 500 MG tablet Take 500 mg by mouth See admin instructions. Take 1 tablet by mouth twice daily for 3 days during outbreak 06/10/18   [provider]    Allergies Patient has no known allergies.  Family History  Problem Relation Age of Onset  . Alcohol abuse Father   . Cancer Father   . Heart disease Father   . Cancer Mother   . Cancer Maternal Grandfather   . Heart disease Maternal Grandfather   . Heart disease Paternal Grandmother   . Diabetes Paternal Grandmother   . Diabetes Paternal Grandfather     Social History Social History   Tobacco Use  . Smoking status: Former Research scientist (life sciences)  . Smokeless tobacco: Never Used  Substance Use Topics  . Alcohol use: No  . Drug use: No    Review of Systems Constitutional: No fever/chills Eyes: No visual changes. ENT: No sore throat. No stiff neck no neck pain Cardiovascular: Denies chest pain. Respiratory: Denies shortness of breath. Gastrointestinal:   no vomiting.  No diarrhea.  No constipation. Genitourinary: Negative for dysuria. Musculoskeletal: Negative lower extremity swelling Skin: Negative for rash. Neurological: Negative for severe headaches, focal weakness or numbness.   ____________________________________________   PHYSICAL EXAM:  VITAL SIGNS: ED Triage Vitals  Enc Vitals Group      BP 12/13/18 1136 (!) 174/64     Pulse Rate 12/13/18 1136 96     Resp 12/13/18 1136 18     Temp 12/13/18 1136 97.6 F (36.4 C)     Temp Source 12/13/18 1136 Oral     SpO2 12/13/18 1136 99 %     Weight 12/13/18 1146 145 lb (65.8 kg)     Height 12/13/18 1146 5\' 4"  (1.626 m)     Head Circumference --      Peak Flow --      Pain Score 12/13/18 1146 0     Pain Loc --      Pain Edu? --      Excl. in [place]? --     Constitutional: Alert and oriented. Well appearing and in no acute distress. Eyes: Conjunctivae are normal, limited funduscopic exam is reassuring Head: Atraumatic HEENT: No congestion/rhinnorhea.  Mucous membranes are moist.  Oropharynx non-erythematous Neck:   Nontender with no meningismus, no masses, no stridor Cardiovascular: Normal rate, regular rhythm. Grossly normal heart sounds.  Good peripheral circulation. Respiratory: Normal respiratory effort.  No retractions. Lungs CTAB. Abdominal: Soft and nontender. No distention. No guarding no rebound Back:  There is no focal tenderness or step off.  there is no midline tenderness there are no lesions noted. there is no CVA tenderness Musculoskeletal: No lower extremity tenderness, no upper extremity tenderness. No joint effusions, no DVT signs strong distal pulses no edema Neurologic: Cranial nerves II through XII are grossly intact 5 out of 5 strength bilateral upper and lower extremity. Finger to nose within normal limits heel to shin within normal limits, speech is normal with no word finding difficulty or dysarthria, reflexes symmetric, pupils are equally round and reactive to light, there is no pronator drift, sensation is normal, vision is intact to confrontation, gait is deferred, there is no nystagmus, normal neurologic exam Skin:  Skin is warm, dry and intact. No rash noted. Psychiatric: Mood and affect are normal. Speech and behavior are normal.  ____________________________________________   LABS (all labs ordered are  listed, but only abnormal results are displayed)  Labs Reviewed  URINALYSIS, COMPLETE (UACMP) WITH MICROSCOPIC - Abnormal; Notable for the following components:      Result Value   Color, Urine YELLOW (*)    APPearance CLEAR (*)    Leukocytes, UA TRACE (*)    Bacteria, UA RARE (*)    All other components within normal limits  URINE DRUG SCREEN, QUALITATIVE ([facility] ONLY) - Abnormal; Notable for the following components:   Opiate, Ur Screen POSITIVE (*)    All other components within normal limits  BASIC METABOLIC PANEL - Abnormal; Notable for the following components:   Creatinine, Ser 1.01 (*)    GFR calc non Af Amer 57 (*)    All other components within normal limits  CBC WITH DIFFERENTIAL/PLATELET - Abnormal; Notable for the following components:   Hemoglobin 11.8 (*)    All other components within normal limits  GLUCOSE, CAPILLARY  TROPONIN I  ETHANOL    Pertinent labs  results that were available during my care of the patient were reviewed by me and considered in my medical decision making (see chart for details). ____________________________________________  EKG  I personally interpreted any EKGs ordered by me or triage 2 EKGs were obtained for this patient, the first 1 had baseline artifact which limited exam, repeat EKG showed no acute ST elevation depression normal axis unremarkable EKG with no acute ischemic changes and borderline LVH ____________________________________________  RADIOLOGY  Pertinent labs & imaging results that were available during my care of the patient were reviewed by me and considered in my medical decision making (see chart for details). If possible, patient and/or family made aware of any abnormal findings.  No results found. ____________________________________________    PROCEDURES  Procedure(s) performed: None  Procedures  Critical Care performed: None  ____________________________________________   INITIAL IMPRESSION / ASSESSMENT  AND PLAN / ED COURSE  Pertinent labs & imaging results that were available during my care of the patient were reviewed by me and considered in my medical decision making (see chart for details).  Patient here with feeling a little blurry in general she states.  Very difficult to interpret though she is neurologically intact, has had extensive and actually comprehensive work-up for this in the last week and states she does not feel much better.  No  evidence of primary ocular pathology on exam, there is no evidence of CVA.  Patient does not want a CT scan and I do not think it is indicated acutely.  She really has not had any change in her status since her MRI.  Patient does have poorly controlled blood pressure, is not clear if this is far from her baseline is not clear if this is in some ways mediated by stress however, it is very stressful to the family that her blood pressure remains elevated when she wakes up in the morning.  We will start her on metoprolol here.  We do not usually start people on blood pressure medications as an outpatient, however, this is I think something that may benefit the patient.  She has not yet been able to follow-up with her PCP.  I will give her a limited prescription for this.  I will do a renal ultrasound to make sure that there is no evidence of acute significant renal stenosis although I am informed by her renal ultrasound apartment that there is only a screening test available at this time she would have to do an outpatient more full work-up to get exact flow parameters.  I do not think that is unreasonable.  Kidney function has been reassuring, I think if we start her on beta-blocker and have her follow closely as is her strong preference she will do well.  Patient adamantly refuses admission to the hospital or any further work-up beyond what I am doing thus far   ----------------------------------------- 5:04 PM on  12/13/2018 -----------------------------------------  Patient's symptoms have resolved she states she feels much better, she feels better than she has in the last 5 days.  I did offer her admission and she declined.  No evidence of visual field deficit.  We will also have her follow-up with ophthalmology.  Nothing to suggest visual stroke, nothing to suggest temporal arteritis etc.  Nothing to suggest glaucoma.  Patient just feels generally unwell in the context of hypertension without any other evidence of hypertensive urgency, kidney exam is reassuring kidney functions reassuring etc.  Given that she refuses admission, and her work-up here is again reassuring after a comprehensive reassuring exam and admission just a few days ago for the same complaint we will discharge her, she does have outpatient follow-up with her primary care doctor.  Return precaution follow-up given understood.  We will start her on metoprolol and have her follow-up   ____________________________________________   FINAL CLINICAL IMPRESSION(S) / ED DIAGNOSES  Final diagnoses:  None      This chart was dictated using voice recognition software.  Despite best efforts to proofread,  errors can occur which can change meaning.      Schuyler Amor, MD 12/13/18 1515    Schuyler Amor, MD 12/13/18 [phone]

## 2018-12-13 NOTE — ED Notes (Signed)
Called lab and spoke with Jean Shaffer and he stated that he would add it on right now

## 2018-12-13 NOTE — ED Triage Notes (Signed)
First RN Note: Pt presents to ED via POV with her son, with c/o hypertension, visual changes, and some confusion, pt is disoriented to location, pt's son reports patient was seen and ruled out for a stroke on Sunday. Pt ambulatory without difficulty. Pt also c/o blurry vision. Pt's son reports home BP over 200.

## 2018-12-13 NOTE — ED Notes (Signed)
Report given to Rachel RN

## 2018-12-16 DIAGNOSIS — R42 Dizziness and giddiness: Secondary | ICD-10-CM | POA: Insufficient documentation

## 2018-12-19 ENCOUNTER — Telehealth: Payer: Self-pay | Admitting: *Deleted

## 2018-12-19 NOTE — Telephone Encounter (Signed)
Referral sent to scheduling and notes on file...Marland KitchenMarland Kitchen

## 2019-02-05 DIAGNOSIS — R002 Palpitations: Secondary | ICD-10-CM | POA: Insufficient documentation

## 2019-02-05 NOTE — Progress Notes (Deleted)
Cardiology Office Note:    Date:  02/05/2019   ID:  Jean Shaffer, DOB [birth year], MRN [id number]  PCP:  [name], MD  Cardiologist:  No primary care provider on file.   Referring MD: [name], MD   No chief complaint on file.   History of Present Illness:    Jean Shaffer is a 68 y.o. female with a hx of hypertension, COPD, and syncope who is referred fro evaluation of palitations by Dr. Suzanna Obey.  Past Medical History:  Diagnosis Date  . Chronic back pain   . COPD (chronic obstructive pulmonary disease) ([place])   . Hypertension   . Thyroid disease     Past Surgical History:  Procedure Laterality Date  . ABDOMINAL HYSTERECTOMY    . COLONOSCOPY    . NECK SURGERY    . SPINE SURGERY      Current Medications: No outpatient medications have been marked as taking for the 02/06/19 encounter (Appointment) with Belva Crome, MD.     Allergies:   Patient has no known allergies.   Social History   Socioeconomic History  . Marital status: Single    Spouse name: Not on file  . Number of children: Not on file  . Years of education: Not on file  . Highest education level: Not on file  Occupational History  . Not on file  Social Needs  . Financial resource strain: Not on file  . Food insecurity:    Worry: Not on file    Inability: Not on file  . Transportation needs:    Medical: Not on file    Non-medical: Not on file  Tobacco Use  . Smoking status: Former Research scientist (life sciences)  . Smokeless tobacco: Never Used  Substance and Sexual Activity  . Alcohol use: No  . Drug use: No  . Sexual activity: Not on file  Lifestyle  . Physical activity:    Days per week: Not on file    Minutes per session: Not on file  . Stress: Not on file  Relationships  . Social connections:    Talks on phone: Not on file    Gets together: Not on file    Attends religious service: Not on file    Active member of club or organization: Not on file    Attends meetings of clubs or  organizations: Not on file    Relationship status: Not on file  Other Topics Concern  . Not on file  Social History Narrative  . Not on file     Family History: The patient's family history includes Alcohol abuse in her father; Cancer in her father, maternal grandfather, and mother; Diabetes in her paternal grandfather and paternal grandmother; Heart disease in her father, maternal grandfather, and paternal grandmother.  ROS:   Please see the history of present illness.    *** All other systems reviewed and are negative.  EKGs/Labs/Other Studies Reviewed:    The following studies were reviewed today: ***  EKG:  EKG ***  Recent Labs: 12/06/2018: ALT 21 12/07/2018: TSH 2.943 12/13/2018: BUN 16; Creatinine, Ser 1.01; Hemoglobin 11.8; Platelets 300; Potassium 4.2; Sodium 139  Recent Lipid Panel    Component Value Date/Time   CHOL (H) 08/23/2010 0515    208        ATP III CLASSIFICATION:  <200     mg/dL   Desirable  200-239  mg/dL   Borderline High  >=240    mg/dL   High  TRIG 187 (H) 08/23/2010 0515   HDL 46 08/23/2010 0515   CHOLHDL 4.5 08/23/2010 0515   VLDL 37 08/23/2010 0515   LDLCALC (H) 08/23/2010 0515    125        Total Cholesterol/HDL:CHD Risk Coronary Heart Disease Risk Table                     Men   Women  1/2 Average Risk   3.4   3.3  Average Risk       5.0   4.4  2 X Average Risk   9.6   7.1  3 X Average Risk  23.4   11.0        Use the calculated Patient Ratio above and the CHD Risk Table to determine the patient's CHD Risk.        ATP III CLASSIFICATION (LDL):  <100     mg/dL   Optimal  100-129  mg/dL   Near or Above                    Optimal  130-159  mg/dL   Borderline  160-189  mg/dL   High  >190     mg/dL   Very High    Physical Exam:    VS:  There were no vitals taken for this visit.    Wt Readings from Last 3 Encounters:  12/13/18 145 lb (65.8 kg)  12/07/18 139 lb 15.9 oz (63.5 kg)     GEN: ***. No acute  distress HEENT: Normal NECK: No JVD. LYMPHATICS: No lymphadenopathy CARDIAC: ***RRR.  *** murmur, ***gallop, ***edema VASCULAR: *** Pulses, *** bruits RESPIRATORY:  Clear to auscultation without rales, wheezing or rhonchi  ABDOMEN: Soft, non-tender, non-distended, No pulsatile mass, MUSCULOSKELETAL: No deformity  SKIN: Warm and dry NEUROLOGIC:  Alert and oriented x 3 PSYCHIATRIC:  Normal affect   ASSESSMENT:    1. Palpitations   2. Essential hypertension   3. Chronic obstructive pulmonary disease, unspecified COPD type ([place])   4. Syncope, unspecified syncope type    PLAN:    In order of problems listed above:  1. ***   Medication Adjustments/Labs and Tests Ordered: Current medicines are reviewed at length with the patient today.  Concerns regarding medicines are outlined above.  No orders of the defined types were placed in this encounter.  No orders of the defined types were placed in this encounter.   There are no Patient Instructions on file for this visit.   Signed, Sinclair Grooms, MD  02/05/2019 9:31 PM    [place]

## 2019-02-06 ENCOUNTER — Ambulatory Visit: Payer: Medicare Other | Admitting: Interventional Cardiology

## 2019-02-07 ENCOUNTER — Encounter: Payer: Self-pay | Admitting: Interventional Cardiology

## 2019-12-27 ENCOUNTER — Emergency Department (HOSPITAL_COMMUNITY): Payer: Medicare Other

## 2019-12-27 ENCOUNTER — Encounter: Payer: Self-pay | Admitting: Emergency Medicine

## 2019-12-27 ENCOUNTER — Inpatient Hospital Stay (HOSPITAL_COMMUNITY)
Admission: EM | Admit: 2019-12-27 | Discharge: 2019-12-30 | DRG: 373 | Disposition: A | Discharge status: Home / Self Care | Payer: Medicare Other | Source: Ambulatory Visit | Attending: Physician Assistant | Admitting: Physician Assistant

## 2019-12-27 ENCOUNTER — Other Ambulatory Visit: Payer: Self-pay

## 2019-12-27 ENCOUNTER — Encounter (HOSPITAL_COMMUNITY): Payer: Self-pay | Admitting: Emergency Medicine

## 2019-12-27 ENCOUNTER — Ambulatory Visit (INDEPENDENT_AMBULATORY_CARE_PROVIDER_SITE_OTHER)
Admission: EM | Admit: 2019-12-27 | Discharge: 2019-12-27 | Disposition: A | Discharge status: Home / Self Care | Payer: Medicare Other | Source: Home / Self Care

## 2019-12-27 DIAGNOSIS — N898 Other specified noninflammatory disorders of vagina: Secondary | ICD-10-CM

## 2019-12-27 DIAGNOSIS — M791 Myalgia, unspecified site: Secondary | ICD-10-CM | POA: Insufficient documentation

## 2019-12-27 DIAGNOSIS — R1030 Lower abdominal pain, unspecified: Secondary | ICD-10-CM | POA: Insufficient documentation

## 2019-12-27 DIAGNOSIS — Z79890 Hormone replacement therapy (postmenopausal): Secondary | ICD-10-CM

## 2019-12-27 DIAGNOSIS — Z87891 Personal history of nicotine dependence: Secondary | ICD-10-CM

## 2019-12-27 DIAGNOSIS — E039 Hypothyroidism, unspecified: Secondary | ICD-10-CM | POA: Diagnosis present

## 2019-12-27 DIAGNOSIS — N739 Female pelvic inflammatory disease, unspecified: Secondary | ICD-10-CM

## 2019-12-27 DIAGNOSIS — Z8249 Family history of ischemic heart disease and other diseases of the circulatory system: Secondary | ICD-10-CM

## 2019-12-27 DIAGNOSIS — Z90710 Acquired absence of both cervix and uterus: Secondary | ICD-10-CM | POA: Diagnosis not present

## 2019-12-27 DIAGNOSIS — Z7982 Long term (current) use of aspirin: Secondary | ICD-10-CM | POA: Diagnosis not present

## 2019-12-27 DIAGNOSIS — Z20822 Contact with and (suspected) exposure to covid-19: Secondary | ICD-10-CM | POA: Diagnosis present

## 2019-12-27 DIAGNOSIS — K3580 Unspecified acute appendicitis: Secondary | ICD-10-CM

## 2019-12-27 DIAGNOSIS — I10 Essential (primary) hypertension: Secondary | ICD-10-CM | POA: Diagnosis present

## 2019-12-27 DIAGNOSIS — K3533 Acute appendicitis with perforation and localized peritonitis, with abscess: Secondary | ICD-10-CM | POA: Diagnosis present

## 2019-12-27 DIAGNOSIS — K3532 Acute appendicitis with perforation and localized peritonitis, without abscess: Secondary | ICD-10-CM | POA: Diagnosis present

## 2019-12-27 DIAGNOSIS — B009 Herpesviral infection, unspecified: Secondary | ICD-10-CM | POA: Diagnosis present

## 2019-12-27 DIAGNOSIS — J449 Chronic obstructive pulmonary disease, unspecified: Secondary | ICD-10-CM | POA: Diagnosis present

## 2019-12-27 HISTORY — DX: Hypothyroidism, unspecified: E03.9

## 2019-12-27 LAB — CBC WITH DIFFERENTIAL/PLATELET
Abs Immature Granulocytes: 0.04 10*3/uL (ref 0.00–0.07)
Basophils Absolute: 0.1 10*3/uL (ref 0.0–0.1)
Basophils Relative: 1 %
Eosinophils Absolute: 0.1 10*3/uL (ref 0.0–0.5)
Eosinophils Relative: 1 %
HCT: 37 % (ref 36.0–46.0)
Hemoglobin: 11.3 g/dL — ABNORMAL LOW (ref 12.0–15.0)
Immature Granulocytes: 0 %
Lymphocytes Relative: 31 %
Lymphs Abs: 3.2 10*3/uL (ref 0.7–4.0)
MCH: 28 pg (ref 26.0–34.0)
MCHC: 30.5 g/dL (ref 30.0–36.0)
MCV: 91.6 fL (ref 80.0–100.0)
Monocytes Absolute: 0.8 10*3/uL (ref 0.1–1.0)
Monocytes Relative: 8 %
Neutro Abs: 6.3 10*3/uL (ref 1.7–7.7)
Neutrophils Relative %: 59 %
Platelets: 399 10*3/uL (ref 150–400)
RBC: 4.04 MIL/uL (ref 3.87–5.11)
RDW: 13.2 % (ref 11.5–15.5)
WBC: 10.5 10*3/uL (ref 4.0–10.5)
nRBC: 0 % (ref 0.0–0.2)

## 2019-12-27 LAB — POCT URINALYSIS DIP (MANUAL ENTRY)
Bilirubin, UA: NEGATIVE
Glucose, UA: NEGATIVE mg/dL
Ketones, POC UA: NEGATIVE mg/dL
Nitrite, UA: NEGATIVE
Protein Ur, POC: 100 mg/dL — AB
Spec Grav, UA: 1.025 (ref 1.010–1.025)
Urobilinogen, UA: 0.2 E.U./dL
pH, UA: 6 (ref 5.0–8.0)

## 2019-12-27 LAB — COMPREHENSIVE METABOLIC PANEL
ALT: 13 U/L (ref 0–44)
AST: 14 U/L — ABNORMAL LOW (ref 15–41)
Albumin: 3.1 g/dL — ABNORMAL LOW (ref 3.5–5.0)
Alkaline Phosphatase: 89 U/L (ref 38–126)
Anion gap: 9 (ref 5–15)
BUN: 10 mg/dL (ref 8–23)
CO2: 26 mmol/L (ref 22–32)
Calcium: 9.4 mg/dL (ref 8.9–10.3)
Chloride: 102 mmol/L (ref 98–111)
Creatinine, Ser: 0.98 mg/dL (ref 0.44–1.00)
GFR calc Af Amer: >60 mL/min (ref >60)
GFR calc non Af Amer: 59 mL/min — ABNORMAL LOW (ref >60)
Glucose, Bld: 95 mg/dL (ref 70–99)
Potassium: 4.4 mmol/L (ref 3.5–5.1)
Sodium: 137 mmol/L (ref 135–145)
Total Bilirubin: 0.5 mg/dL (ref 0.3–1.2)
Total Protein: 7.5 g/dL (ref 6.5–8.1)

## 2019-12-27 LAB — LACTIC ACID, PLASMA: Lactic Acid, Venous: 0.7 mmol/L (ref 0.5–1.9)

## 2019-12-27 MED ORDER — LACTATED RINGERS IV BOLUS
1000.0000 mL | Freq: Once | INTRAVENOUS | Status: AC
  Administered 2019-12-27: 1000 mL via INTRAVENOUS

## 2019-12-27 MED ORDER — AMLODIPINE BESYLATE 5 MG PO TABS
10.0000 mg | ORAL_TABLET | Freq: Once | ORAL | Status: AC
  Administered 2019-12-27: 10 mg via ORAL
  Filled 2019-12-27: qty 2

## 2019-12-27 MED ORDER — METOPROLOL TARTRATE 25 MG PO TABS
25.0000 mg | ORAL_TABLET | Freq: Once | ORAL | Status: DC
  Filled 2019-12-27: qty 1

## 2019-12-27 MED ORDER — ACETAMINOPHEN 325 MG PO TABS: 650.0000 mg | ORAL_TABLET | Freq: Four times a day (QID) | ORAL | Status: DC | PRN

## 2019-12-27 MED ORDER — PIPERACILLIN-TAZOBACTAM 3.375 G IVPB
3.3750 g | Freq: Three times a day (TID) | INTRAVENOUS | Status: DC
  Administered 2019-12-28 – 2019-12-30 (×7): 3.375 g via INTRAVENOUS
  Filled 2019-12-27 (×7): qty 50

## 2019-12-27 MED ORDER — METOPROLOL TARTRATE 25 MG PO TABS: 25.0000 mg | ORAL_TABLET | Freq: Two times a day (BID) | ORAL | Status: DC

## 2019-12-27 MED ORDER — MORPHINE SULFATE (PF) 2 MG/ML IV SOLN
2.0000 mg | INTRAVENOUS | Status: DC | PRN
  Administered 2019-12-28 – 2019-12-29 (×3): 2 mg via INTRAVENOUS
  Filled 2019-12-27 (×3): qty 1

## 2019-12-27 MED ORDER — ACETAMINOPHEN 650 MG RE SUPP: 650.0000 mg | Freq: Four times a day (QID) | RECTAL | Status: DC | PRN

## 2019-12-27 MED ORDER — IOHEXOL 300 MG/ML  SOLN
100.0000 mL | Freq: Once | INTRAMUSCULAR | Status: AC | PRN
  Administered 2019-12-27: 100 mL via INTRAVENOUS

## 2019-12-27 MED ORDER — METHOCARBAMOL 500 MG PO TABS
500.0000 mg | ORAL_TABLET | Freq: Four times a day (QID) | ORAL | Status: DC | PRN
  Filled 2019-12-27: qty 1

## 2019-12-27 MED ORDER — SODIUM CHLORIDE 0.9 % IV SOLN: INTRAVENOUS | Status: DC

## 2019-12-27 MED ORDER — ONDANSETRON 4 MG PO TBDP: 4.0000 mg | ORAL_TABLET | Freq: Four times a day (QID) | ORAL | Status: DC | PRN

## 2019-12-27 MED ORDER — PIPERACILLIN-TAZOBACTAM 3.375 G IVPB 30 MIN
3.3750 g | Freq: Once | INTRAVENOUS | Status: AC
  Administered 2019-12-27: 3.375 g via INTRAVENOUS
  Filled 2019-12-27: qty 50

## 2019-12-27 MED ORDER — OXYCODONE HCL 5 MG PO TABS
5.0000 mg | ORAL_TABLET | ORAL | Status: DC | PRN
  Administered 2019-12-28 – 2019-12-29 (×2): 10 mg via ORAL
  Filled 2019-12-27 (×2): qty 2

## 2019-12-27 MED ORDER — METRONIDAZOLE IN NACL 5-0.79 MG/ML-% IV SOLN
500.0000 mg | Freq: Once | INTRAVENOUS | Status: AC
  Administered 2019-12-27: 500 mg via INTRAVENOUS
  Filled 2019-12-27: qty 100

## 2019-12-27 MED ORDER — ONDANSETRON HCL 4 MG/2ML IJ SOLN
4.0000 mg | Freq: Four times a day (QID) | INTRAMUSCULAR | Status: DC | PRN
  Administered 2019-12-28: 4 mg via INTRAVENOUS
  Filled 2019-12-27: qty 2

## 2019-12-27 MED ORDER — SIMETHICONE 80 MG PO CHEW: 40.0000 mg | CHEWABLE_TABLET | Freq: Four times a day (QID) | ORAL | Status: DC | PRN

## 2019-12-27 MED ORDER — METOPROLOL TARTRATE 5 MG/5ML IV SOLN: 5.0000 mg | Freq: Four times a day (QID) | INTRAVENOUS | Status: DC | PRN

## 2019-12-27 MED ORDER — HYDROCODONE-ACETAMINOPHEN 5-325 MG PO TABS
1.0000 | ORAL_TABLET | Freq: Once | ORAL | Status: AC
  Administered 2019-12-27: 1 via ORAL
  Filled 2019-12-27: qty 1

## 2019-12-27 NOTE — H&P (Signed)
Jean Shaffer is an 69 y.o. female.   Chief Complaint: vaginal drainage HPI: 25 yof who has history of what she states is appendectomy in her 56s during exploratory surgery and a hysterectomy presents with vaginal discharge and pelvic pain today.  She a couple weeks ago has n/v/d ab pain and was evaluated for viral syndrome, neg covid. This got somewhat better and then today had greenish drainage from her vagina. She was seen in urgent care and had speculum exam with drainage noted from vagina worse with palpation in her rlq. She has not had any more fevers.  Is having bms. States normal csc about four years ago.  She underwent ct scan and it appears she has perforated appendicitis with pelvic abscess.  I was asked to see her.   Past Medical History:  Diagnosis Date  . Chronic back pain   . COPD (chronic obstructive pulmonary disease) ([place])   . Hypertension   . Thyroid disease     Past Surgical History:  Procedure Laterality Date  . ABDOMINAL HYSTERECTOMY    . COLONOSCOPY    . NECK SURGERY    . SPINE SURGERY      Family History  Problem Relation Age of Onset  . Alcohol abuse Father   . Cancer Father   . Heart disease Father   . Cancer Mother   . Cancer Maternal Grandfather   . Heart disease Maternal Grandfather   . Heart disease Paternal Grandmother   . Diabetes Paternal Grandmother   . Diabetes Paternal Grandfather    Social History:  reports that she has quit smoking. She has never used smokeless tobacco. She reports that she does not drink alcohol or use drugs.  Allergies: No Known Allergies  meds reviewed  Results for orders placed or performed during the hospital encounter of 12/27/19 (from the past 48 hour(s))  CBC with Differential     Status: Abnormal   Collection Time: 12/27/19  2:55 PM  Result Value Ref Range   WBC 10.5 4.0 - 10.5 K/uL   RBC 4.04 3.87 - 5.11 MIL/uL   Hemoglobin 11.3 (L) 12.0 - 15.0 g/dL   HCT 37.0 36.0 - 46.0 %   MCV 91.6 80.0 - 100.0 fL   MCH 28.0 26.0 - 34.0 pg   MCHC 30.5 30.0 - 36.0 g/dL   RDW 13.2 11.5 - 15.5 %   Platelets 399 150 - 400 K/uL   nRBC 0.0 0.0 - 0.2 %   Neutrophils Relative % 59 %   Neutro Abs 6.3 1.7 - 7.7 K/uL   Lymphocytes Relative 31 %   Lymphs Abs 3.2 0.7 - 4.0 K/uL   Monocytes Relative 8 %   Monocytes Absolute 0.8 0.1 - 1.0 K/uL   Eosinophils Relative 1 %   Eosinophils Absolute 0.1 0.0 - 0.5 K/uL   Basophils Relative 1 %   Basophils Absolute 0.1 0.0 - 0.1 K/uL   Immature Granulocytes 0 %   Abs Immature Granulocytes 0.04 0.00 - 0.07 K/uL    Comment: Performed at [facility], [address] [place], [place] [address]  Comprehensive metabolic panel     Status: Abnormal   Collection Time: 12/27/19  2:55 PM  Result Value Ref Range   Sodium 137 135 - 145 mmol/L   Potassium 4.4 3.5 - 5.1 mmol/L   Chloride 102 98 - 111 mmol/L   CO2 26 22 - 32 mmol/L   Glucose, Bld 95 70 - 99 mg/dL   BUN 10  8 - 23 mg/dL   Creatinine, Ser 0.98 0.44 - 1.00 mg/dL   Calcium 9.4 8.9 - 10.3 mg/dL   Total Protein 7.5 6.5 - 8.1 g/dL   Albumin 3.1 (L) 3.5 - 5.0 g/dL   AST 14 (L) 15 - 41 U/L   ALT 13 0 - 44 U/L   Alkaline Phosphatase 89 38 - 126 U/L   Total Bilirubin 0.5 0.3 - 1.2 mg/dL   GFR calc non Af Amer 59 (L) >60 mL/min   GFR calc Af Amer >60 >60 mL/min   Anion gap 9 5 - 15    Comment: Performed at [facility] [address] [place], [place] [address]  Lactic acid, plasma     Status: None   Collection Time: 12/27/19  3:00 PM  Result Value Ref Range   Lactic Acid, Venous 0.7 0.5 - 1.9 mmol/L    Comment: Performed at [facility] [address], [place], [place] [address]   CT ABDOMEN PELVIS W CONTRAST  Result Date: 12/27/2019 CLINICAL DATA:  Pelvic abscess.  Nausea and vomiting.  UTI. EXAM: CT ABDOMEN AND PELVIS WITH CONTRAST TECHNIQUE: Multidetector CT imaging of the abdomen and pelvis was performed using the standard protocol following bolus administration of intravenous contrast.  CONTRAST:  179mL OMNIPAQUE IOHEXOL 300 MG/ML  SOLN COMPARISON:  None. FINDINGS: Lower chest: The lung bases are clear. The heart size is normal. Hepatobiliary: The liver is normal. Normal gallbladder.There is no biliary ductal dilation. Pancreas: Normal contours without ductal dilatation. No peripancreatic fluid collection. Spleen: No splenic laceration or hematoma. Adrenals/Urinary Tract: --Adrenal glands: No adrenal hemorrhage. --Right kidney/ureter: No hydronephrosis or perinephric hematoma. --Left kidney/ureter: No hydronephrosis or perinephric hematoma. --Urinary bladder: Unremarkable. Stomach/Bowel: --Stomach/Duodenum: No hiatal hernia or other gastric abnormality. Normal duodenal course and caliber. --Small bowel: No dilatation or inflammation. --Colon: No focal abnormality. --Appendix: The appendix is diffusely enlarged measuring up to approximately 9 mm proximally. There are likely appendicular lists throughout the appendix. The distal appendix appears to be ruptured with a developing abscess measuring approximately 2.7 x 4.9 cm located within the pelvis. The appendix is primarily located within the patient's pelvis. Vascular/Lymphatic: Atherosclerotic calcification is present within the non-aneurysmal abdominal aorta, without hemodynamically significant stenosis. --No retroperitoneal lymphadenopathy. --No mesenteric lymphadenopathy. --No pelvic or inguinal lymphadenopathy. Reproductive: Status post hysterectomy. No adnexal mass. Other: No ascites or free air. The abdominal wall is normal. Musculoskeletal. No acute displaced fractures. Patient is status post posterior fusion from L4 through S1. Interbody spaces are noted at the L4-L5 and L5-S1 levels. IMPRESSION: 1. Overall findings concerning for perforated appendicitis with a developing 4.9 x 2.6 cm abscess in the patient's pelvis. 2. No evidence for hydronephrosis. 3. Status post hysterectomy. 4.  Aortic Atherosclerosis (ICD10-I70.0). Electronically  Signed   By: Constance Holster M.D.   On: 12/27/2019 20:52    Review of Systems  Gastrointestinal: Positive for abdominal pain.  Genitourinary: Positive for vaginal discharge.  All other systems reviewed and are negative.   Blood pressure (!) 121/50, pulse (!) 59, temperature 97.7 F (36.5 C), temperature source Oral, resp. rate 14, SpO2 97 %. Physical Exam  Vitals reviewed. Constitutional: She is oriented to person, place, and time. She appears well-developed and well-nourished.  HENT:  Head: Normocephalic and atraumatic.  Eyes: No scleral icterus.  Cardiovascular: Normal rate, regular rhythm and normal heart sounds.  Respiratory: Effort normal and breath sounds normal.  GI: Soft. She exhibits no distension. There is abdominal tenderness (very mild  suprapubic tenderness).    Musculoskeletal:     Cervical back: Neck supple.  Neurological: She is alert and oriented to person, place, and time.  Skin: Skin is warm and dry.  Psychiatric: She has a normal mood and affect. Her behavior is normal.     Assessment/Plan Perforated appendicitis -although she states had appendectomy it appears she has appendix and has rupture distally with low pelvic abscess. I think this has then decompressed somewhat through the vagina -she is not ill right now and does not need urgent surgery. I think reasonable to put on abx, npo and will ask IR to see her and drain abscess if amenable -discussed plan with patient  Rolm Bookbinder, MD 12/27/2019, 10:29 PM

## 2019-12-27 NOTE — ED Provider Notes (Signed)
[facility]    CSN: [id number] Arrival date & time: 12/27/19  1101      History   Chief Complaint Chief Complaint  Patient presents with  . Vaginal Discharge    HPI Jean Shaffer is a 69 y.o. female history hypertension, COPD, thyroid disease presenting for copious, malodorous vaginal discharge since yesterday morning.  Patient states she had a "gush "when she stood up yesterday morning.  Patient has had diffuse lower abdominal pain for approximately 3 weeks, though this seems to have been overlooked by patient second to pressing symptoms from URI, UTI: Both for which she was evaluated/treated with adequate resolution of symptoms.  Patient finished Bactrim for UTI Monday.  Patient not currently sexually active: Last intercourse 20 years ago.  Patient endorses history of herpes: Last flare 3 weeks ago without complications.  Patient is status post hysterectomy: Performed 1992.  Patient does note that mother had either uterine or ovarian cancer, from which she passed at age 82.   Past Medical History:  Diagnosis Date  . Chronic back pain   . COPD (chronic obstructive pulmonary disease) ([place])   . Hypertension   . Thyroid disease     Patient Active Problem List   Diagnosis Date Noted  . Perforated appendicitis 12/27/2019  . Palpitations 02/05/2019  . Syncope 12/07/2018  . HTN (hypertension) 12/07/2018  . COPD (chronic obstructive pulmonary disease) ([place]) 12/07/2018    Past Surgical History:  Procedure Laterality Date  . ABDOMINAL HYSTERECTOMY    . COLONOSCOPY    . NECK SURGERY    . SPINE SURGERY      OB History   No obstetric history on file.      Home Medications    Prior to Admission medications   Medication Sig Start Date End Date Taking? Authorizing Provider  albuterol (PROVENTIL HFA;VENTOLIN HFA) 108 (90 Base) MCG/ACT inhaler Inhale 2 puffs into the lungs every 4 (four) hours as needed for shortness of breath or wheezing. 07/30/15   [provider]  amLODipine (NORVASC) 2.5 MG tablet Take 2.5 mg by mouth daily.    [provider]  aspirin 81 MG tablet Take 81 mg by mouth every morning.    [provider]  buPROPion (WELLBUTRIN XL) 150 MG 24 hr tablet Take 150 mg by mouth every morning. 09/11/18   [provider]  cholecalciferol (VITAMIN D) 25 MCG (1000 UT) tablet Take 1,000 Units by mouth daily.    [provider]  diclofenac sodium (VOLTAREN) 1 % GEL Apply 2 g topically 4 (four) times daily as needed (knee pain).  07/30/15   [provider]  HYDROcodone-acetaminophen (NORCO/VICODIN) 5-325 MG per tablet Take 1-2 tablets by mouth every 6 hours as needed for pain. 02/10/15   Pisciotta, Elmyra Ricks, PA-C  ibuprofen (ADVIL,MOTRIN) 200 MG tablet Take 200 mg by mouth every 6 (six) hours as needed for pain.    [provider]  levothyroxine (SYNTHROID, LEVOTHROID) 50 MCG tablet Take 50 mcg by mouth every morning. 09/06/18   [provider]  tiotropium (SPIRIVA HANDIHALER) 18 MCG inhalation capsule Take 1 puff by mouth daily. 10/08/18   [provider]  traMADol (ULTRAM) 50 MG tablet Take 50 mg by mouth every 6 (six) hours as needed for pain.    [provider]  traZODone (DESYREL) 100 MG tablet Take 100 mg by mouth at bedtime. 03/01/18   [provider]  valACYclovir (VALTREX) 500 MG tablet Take 500 mg by mouth See admin instructions.  Take 1 tablet by mouth twice daily for 3 days during outbreak 06/10/18   [provider]    Family History Family History  Problem Relation Age of Onset  . Alcohol abuse Father   . Cancer Father   . Heart disease Father   . Cancer Mother   . Cancer Maternal Grandfather   . Heart disease Maternal Grandfather   . Heart disease Paternal Grandmother   . Diabetes Paternal Grandmother   . Diabetes Paternal Grandfather     Social History Social History   Tobacco Use  . Smoking status: Former Research scientist (life sciences)  .  Smokeless tobacco: Never Used  Substance Use Topics  . Alcohol use: No  . Drug use: No     Allergies   Patient has no known allergies.   Review of Systems Review of Systems  Constitutional: Negative for fatigue and fever.  Respiratory: Negative for cough and shortness of breath.   Cardiovascular: Negative for chest pain and palpitations.  Gastrointestinal: Positive for abdominal pain. Negative for abdominal distention, constipation, diarrhea, nausea, rectal pain and vomiting.  Genitourinary: Positive for vaginal bleeding and vaginal discharge. Negative for dysuria, flank pain, frequency, hematuria, pelvic pain, urgency and vaginal pain.     Physical Exam Triage Vital Signs ED Triage Vitals  Enc Vitals Group     BP 12/27/19 1337 (!) 173/80     Pulse Rate 12/27/19 1337 74     Resp 12/27/19 1337 16     Temp 12/27/19 1337 97.8 F (36.6 C)     Temp Source 12/27/19 1337 Temporal     SpO2 12/27/19 1337 95 %     Weight --      Height --      Head Circumference --      Peak Flow --      Pain Score 12/27/19 1340 6     Pain Loc --      Pain Edu? --      Excl. in [place]? --    No data found.  Updated Vital Signs BP (!) 173/80 (BP Location: Right Arm)   Pulse 74   Temp 97.8 F (36.6 C) (Temporal)   Resp 16   SpO2 95%   Visual Acuity Right Eye Distance:   Left Eye Distance:   Bilateral Distance:    Right Eye Near:   Left Eye Near:    Bilateral Near:     Physical Exam Constitutional:      General: She is not in acute distress. HENT:     Head: Normocephalic and atraumatic.     Mouth/Throat:     Mouth: Mucous membranes are moist.     Pharynx: Oropharynx is clear.  Eyes:     General: No scleral icterus.    Pupils: Pupils are equal, round, and reactive to light.  Cardiovascular:     Rate and Rhythm: Normal rate.  Pulmonary:     Effort: Pulmonary effort is normal. No respiratory distress.     Breath sounds: No wheezing.  Abdominal:     General: Bowel sounds are  normal.     Tenderness: There is abdominal tenderness. There is no right CVA tenderness, left CVA tenderness or guarding.     Comments: Mild suprapubic tenderness  Genitourinary:    Comments: Copious malodorous gray/green discharge around vulva.  Speculum exam limited second discharge.  No obvious vaginal laceration/injury discharge appears to be coming from right side distal vaginal canal despite lack of visualized opening/wound.  More discharge produced with palpation  of RLQ.  Bimanual deferred Skin:    Coloration: Skin is not jaundiced or pale.  Neurological:     Mental Status: She is alert and oriented to person, place, and time.      UC Treatments / Results  Labs (all labs ordered are listed, but only abnormal results are displayed)   EKG   Radiology   Procedures Procedures (including critical care time)  Medications Ordered in UC Medications - No data to display  Initial Impression / Assessment and Plan / UC Course  I have reviewed the triage vital signs and the nursing notes.  Pertinent labs & imaging results that were available during my care of the patient were reviewed by me and considered in my medical decision making (see chart for details).     Patient afebrile, nontoxic in office.  Exam concerning for possible pelvic abscess plus/minus fistula formation into the vaginal canal.  Patient referred to ER for further evaluation/management, able to self transport as she is hemodynamically stable and of sound mind. Final Clinical Impressions(s) / UC Diagnoses   Final diagnoses:  Vaginal discharge  Lower abdominal pain  Myalgia     Discharge Instructions     Please go to ER for further evaluation of your vaginal discharge, lower abdominal pain, myalgias.    ED Prescriptions    None     PDMP not reviewed this encounter.   Neldon Mc [place], [place] 12/28/19 1746

## 2019-12-27 NOTE — ED Triage Notes (Signed)
Green drainage aND BLOOD FROM VAGINA X 1 DAY  [facility] AND WAS SENT HERE TO RULE OUT ABCESS IN VAGINAL AREA , ABD PAIN AND LOW2ER BACK PAIN

## 2019-12-27 NOTE — ED Provider Notes (Signed)
[facility] EMERGENCY DEPARTMENT Provider Note   CSN: [id number] Arrival date & time: 12/27/19  1441     History Chief Complaint  Patient presents with  . Vaginal Pain  . Wound Infection    Jean Shaffer is a 69 y.o. female.  HPI Jean Shaffer is a 69 y.o. female with a medical history of copd, htn who presents to the ED for vaginal discharge and pelvic discomfort. She noticed yesterday when standing a moderate amount of purulent drainage from her vagina. She denies previous similar episodes, has not been sexually active in 20 years. She does have h/o herpes, last flare 3 weeks ago.  She had hysterectomy in 0000000 without complication. Denies any recent abdominal surgeries or issues. Sent from [facility] for evaluation because provider performed a speculum exam and noticed no obvious vaginal laceration/injury, discharge appears to be coming from right side distal vaginal canal.  More discharge produced with palpation of RLQ. Denies urinary or bm complaints.  Reports recent uti that has resolved after taking bactrim. Reports two weeks ago had a viral syndrome which has since resolved and tested negative for covid during that time.      Past Medical History:  Diagnosis Date  . Chronic back pain   . COPD (chronic obstructive pulmonary disease) ([place])   . Hypertension   . Thyroid disease     Patient Active Problem List   Diagnosis Date Noted  . Perforated appendicitis 12/27/2019  . Palpitations 02/05/2019  . Syncope 12/07/2018  . HTN (hypertension) 12/07/2018  . COPD (chronic obstructive pulmonary disease) ([place]) 12/07/2018    Past Surgical History:  Procedure Laterality Date  . ABDOMINAL HYSTERECTOMY    . COLONOSCOPY    . NECK SURGERY    . SPINE SURGERY       OB History   No obstetric history on file.     Family History  Problem Relation Age of Onset  . Alcohol abuse Father   . Cancer Father   . Heart disease Father   . Cancer Mother   . Cancer  Maternal Grandfather   . Heart disease Maternal Grandfather   . Heart disease Paternal Grandmother   . Diabetes Paternal Grandmother   . Diabetes Paternal Grandfather     Social History   Tobacco Use  . Smoking status: Former Research scientist (life sciences)  . Smokeless tobacco: Never Used  Substance Use Topics  . Alcohol use: No  . Drug use: No    Home Medications Prior to Admission medications   Medication Sig Start Date End Date Taking? Authorizing Provider  albuterol (PROVENTIL HFA;VENTOLIN HFA) 108 (90 Base) MCG/ACT inhaler Inhale 2 puffs into the lungs every 4 (four) hours as needed for shortness of breath or wheezing. 07/30/15  Yes [provider]  amLODipine (NORVASC) 2.5 MG tablet Take 2.5 mg by mouth daily.   Yes [provider]  aspirin 81 MG tablet Take 81 mg by mouth every morning.   Yes [provider]  buPROPion (WELLBUTRIN XL) 150 MG 24 hr tablet Take 150 mg by mouth every morning. 09/11/18  Yes [provider]  cholecalciferol (VITAMIN D) 25 MCG (1000 UT) tablet Take 1,000 Units by mouth daily.   Yes [provider]  diclofenac sodium (VOLTAREN) 1 % GEL Apply 2 g topically 4 (four) times daily as needed (knee pain).  07/30/15  Yes [provider]  HYDROcodone-acetaminophen (NORCO/VICODIN) 5-325 MG per tablet Take 1-2 tablets by mouth every 6 hours as needed for pain.  02/10/15  Yes Pisciotta, Elmyra Ricks, PA-C  ibuprofen (ADVIL,MOTRIN) 200 MG tablet Take 200 mg by mouth every 6 (six) hours as needed for pain.   Yes [provider]  levothyroxine (SYNTHROID, LEVOTHROID) 50 MCG tablet Take 50 mcg by mouth every morning. 09/06/18  Yes [provider]  tiotropium (SPIRIVA HANDIHALER) 18 MCG inhalation capsule Take 1 puff by mouth daily. 10/08/18  Yes [provider]  traMADol (ULTRAM) 50 MG tablet Take 50 mg by mouth every 6 (six) hours as needed for pain.   Yes [provider]  traZODone (DESYREL) 100 MG tablet Take 100  mg by mouth at bedtime. 03/01/18  Yes [provider]  valACYclovir (VALTREX) 500 MG tablet Take 500 mg by mouth See admin instructions. Take 1 tablet by mouth twice daily for 3 days during outbreak 06/10/18  Yes [provider]    Allergies    Patient has no known allergies.  Review of Systems   Review of Systems  Constitutional: Negative for chills and fever.  HENT: Negative for ear pain and sore throat.   Eyes: Negative for pain and visual disturbance.  Respiratory: Negative for cough and shortness of breath.   Cardiovascular: Negative for chest pain and palpitations.  Gastrointestinal: Positive for abdominal pain. Negative for vomiting.  Genitourinary: Positive for pelvic pain, vaginal discharge and vaginal pain. Negative for decreased urine volume, dysuria, frequency, hematuria and vaginal bleeding.  Musculoskeletal: Negative for arthralgias and back pain.  Skin: Negative for color change and rash.  Neurological: Negative for seizures and syncope.  All other systems reviewed and are negative.   Physical Exam Updated Vital Signs BP (!) 121/50 (BP Location: Left Arm)   Pulse (!) 59   Temp 97.7 F (36.5 C) (Oral)   Resp 14   SpO2 97%   Physical Exam Vitals and nursing note reviewed.  Constitutional:      General: She is in acute distress.     Appearance: Normal appearance. She is well-developed. She is not ill-appearing.  HENT:     Head: Normocephalic and atraumatic.     Right Ear: External ear normal.     Left Ear: External ear normal.     Nose: Nose normal. No rhinorrhea.     Mouth/Throat:     Mouth: Mucous membranes are moist.  Eyes:     General:        Right eye: No discharge.        Left eye: No discharge.     Conjunctiva/sclera: Conjunctivae normal.  Cardiovascular:     Rate and Rhythm: Normal rate and regular rhythm.     Pulses: Normal pulses.     Heart sounds: Normal heart sounds. No murmur.  Pulmonary:     Effort: Pulmonary effort is  normal. No respiratory distress.     Breath sounds: Normal breath sounds. No wheezing or rales.  Abdominal:     General: Abdomen is flat. There is no distension.     Palpations: Abdomen is soft.     Tenderness: There is abdominal tenderness.  Genitourinary:    General: Normal vulva.     Vagina: Vaginal discharge present.     Comments: Copious purulent vaginal discharge in vaginal vault, unable to visualize a source. She has diffuse pelvic tenderness Musculoskeletal:        General: No deformity or signs of injury. Normal range of motion.     Cervical back: Normal range of motion and neck supple.  Skin:    General: Skin  is warm and dry.     Capillary Refill: Capillary refill takes less than 2 seconds.     Coloration: Skin is not jaundiced.  Neurological:     General: No focal deficit present.     Mental Status: She is alert. Mental status is at baseline.  Psychiatric:        Mood and Affect: Mood normal.        Behavior: Behavior normal.     ED Results / Procedures / Treatments   Labs (all labs ordered are listed, but only abnormal results are displayed) Labs Reviewed  CBC WITH DIFFERENTIAL/PLATELET - Abnormal; Notable for the following components:      Result Value   Hemoglobin 11.3 (*)    All other components within normal limits  COMPREHENSIVE METABOLIC PANEL - Abnormal; Notable for the following components:   Albumin 3.1 (*)    AST 14 (*)    GFR calc non Af Amer 59 (*)    All other components within normal limits  SARS CORONAVIRUS 2 (TAT 6-24 HRS)  LACTIC ACID, PLASMA  BASIC METABOLIC PANEL  CBC  GC/CHLAMYDIA PROBE AMP ([place]) NOT AT [facility]  WET PREP  (BD AFFIRM) ([place])    EKG None  Radiology CT ABDOMEN PELVIS W CONTRAST  Result Date: 12/27/2019 CLINICAL DATA:  Pelvic abscess.  Nausea and vomiting.  UTI. EXAM: CT ABDOMEN AND PELVIS WITH CONTRAST TECHNIQUE: Multidetector CT imaging of the abdomen and pelvis was performed using the standard protocol  following bolus administration of intravenous contrast. CONTRAST:  139mL OMNIPAQUE IOHEXOL 300 MG/ML  SOLN COMPARISON:  None. FINDINGS: Lower chest: The lung bases are clear. The heart size is normal. Hepatobiliary: The liver is normal. Normal gallbladder.There is no biliary ductal dilation. Pancreas: Normal contours without ductal dilatation. No peripancreatic fluid collection. Spleen: No splenic laceration or hematoma. Adrenals/Urinary Tract: --Adrenal glands: No adrenal hemorrhage. --Right kidney/ureter: No hydronephrosis or perinephric hematoma. --Left kidney/ureter: No hydronephrosis or perinephric hematoma. --Urinary bladder: Unremarkable. Stomach/Bowel: --Stomach/Duodenum: No hiatal hernia or other gastric abnormality. Normal duodenal course and caliber. --Small bowel: No dilatation or inflammation. --Colon: No focal abnormality. --Appendix: The appendix is diffusely enlarged measuring up to approximately 9 mm proximally. There are likely appendicular lists throughout the appendix. The distal appendix appears to be ruptured with a developing abscess measuring approximately 2.7 x 4.9 cm located within the pelvis. The appendix is primarily located within the patient's pelvis. Vascular/Lymphatic: Atherosclerotic calcification is present within the non-aneurysmal abdominal aorta, without hemodynamically significant stenosis. --No retroperitoneal lymphadenopathy. --No mesenteric lymphadenopathy. --No pelvic or inguinal lymphadenopathy. Reproductive: Status post hysterectomy. No adnexal mass. Other: No ascites or free air. The abdominal wall is normal. Musculoskeletal. No acute displaced fractures. Patient is status post posterior fusion from L4 through S1. Interbody spaces are noted at the L4-L5 and L5-S1 levels. IMPRESSION: 1. Overall findings concerning for perforated appendicitis with a developing 4.9 x 2.6 cm abscess in the patient's pelvis. 2. No evidence for hydronephrosis. 3. Status post hysterectomy. 4.   Aortic Atherosclerosis (ICD10-I70.0). Electronically Signed   By: Constance Holster M.D.   On: 12/27/2019 20:52    Procedures Procedures (including critical care time)  Medications Ordered in ED Medications  0.9 %  sodium chloride infusion (has no administration in time range)  acetaminophen (TYLENOL) tablet 650 mg (has no administration in time range)    Or  acetaminophen (TYLENOL) suppository 650 mg (has no administration in time range)  oxyCODONE (Oxy IR/ROXICODONE) immediate release tablet 5-10 mg (has no  administration in time range)  morphine 2 MG/ML injection 2 mg (has no administration in time range)  methocarbamol (ROBAXIN) tablet 500 mg (has no administration in time range)  ondansetron (ZOFRAN-ODT) disintegrating tablet 4 mg (has no administration in time range)    Or  ondansetron (ZOFRAN) injection 4 mg (has no administration in time range)  simethicone (MYLICON) chewable tablet 40 mg (has no administration in time range)  metoprolol tartrate (LOPRESSOR) injection 5 mg (has no administration in time range)  piperacillin-tazobactam (ZOSYN) IVPB 3.375 g (has no administration in time range)  amLODipine (NORVASC) tablet 10 mg (10 mg Oral Given 12/27/19 1952)  HYDROcodone-acetaminophen (NORCO/VICODIN) 5-325 MG per tablet 1 tablet (1 tablet Oral Given 12/27/19 2054)  iohexol (OMNIPAQUE) 300 MG/ML solution 100 mL (100 mLs Intravenous Contrast Given 12/27/19 2024)  piperacillin-tazobactam (ZOSYN) IVPB 3.375 g (0 g Intravenous Stopped 12/27/19 2213)  metroNIDAZOLE (FLAGYL) IVPB 500 mg (0 mg Intravenous Stopped 12/27/19 2304)  lactated ringers bolus 1,000 mL (1,000 mLs Intravenous New Bag/Given 12/27/19 2306)    ED Course  I have reviewed the triage vital signs and the nursing notes.  Pertinent labs & imaging results that were available during my care of the patient were reviewed by me and considered in my medical decision making (see chart for details).    MDM Rules/Calculators/A&P                       Jean Shaffer is a 69 y.o. female with a medical history of copd, htn who presents to the ED for vaginal discharge and pelvic discomfort. She noticed yesterday when standing a moderate amount of purulent drainage from her vagina. She denies previous similar episodes, has not been sexually active in 20 years.  Hysterectomy in 0000000 without complication. Denies any recent abdominal surgeries or issues. Sent from [facility] for evaluation because provider performed a speculum exam and noticed no obvious vaginal laceration/injury, discharge appears to be coming from right side distal vaginal canal.  More discharge produced with palpation of RLQ.  HPI and physical exam as above. She presents awake, alert, hemodynamically stable, afebrile. Copious amount of purulent vaginal discharge in vaginal vault, unable to visualize a source. She has diffuse pelvic tenderness.   She does not seem to be septic at this time.  Her laboratory work is reassuring, lactic acid 0.7, no leukocytosis. Obtained ct imaging of her abdomen/pelvis which demonstrates findings concerning for perforated appendicitis with a developing 4.9 x 2.6 cm abscess in the patient's pelvis.  We administered IV fluids, broad-spectrum antibiotics and consulted surgery for evaluation.  They agree with evaluation and plan to admit her for further management.    Final Clinical Impression(s) / ED Diagnoses Final diagnoses:  Acute appendicitis, unspecified acute appendicitis type  Perforated appendicitis    Rx / DC Orders ED Discharge Orders    None       Murle Otting, Lovena Le, MD 12/28/19 [id number]    Lucrezia Starch, MD 12/29/19 1249

## 2019-12-27 NOTE — ED Triage Notes (Signed)
Pt presents to [facility] for assessment of 3 weeks of URI type symptoms, with nasal congestion, sore throat, fevers.  Developed n/v/d a week later.  Pt then developed a UTI, and spoke to her PCP about it, but never started antibiotics for it.   Pt states she woke up yesterday with green purulent discharge coming from her vagina.  States she went through 8 pads yesterday.  Today it has now tan and bloody.  The amount of discharge has now gone done.   Pt c/o pain to lower back and abdomen.

## 2019-12-27 NOTE — Progress Notes (Signed)
Pharmacy Antibiotic Note  Jean Shaffer is a 69 y.o. female admitted on 12/27/2019 with pelvic abscess.  Pharmacy has been consulted for Zosyn dosing. WBC WNL. Renal function ok.   Plan: Zosyn 3.375G IV q8h to be infused over 4 hours Trend WBC, temp, renal function  F/U infectious work-up  Temp (24hrs), Avg:97.8 F (36.6 C), Min:97.7 F (36.5 C), Max:97.8 F (36.6 C)  Recent Labs  Lab 12/27/19 1455 12/27/19 1500  WBC 10.5  --   CREATININE 0.98  --   LATICACIDVEN  --  0.7    CrCl cannot be calculated (Unknown ideal weight.).    No Known Allergies  Narda Bonds, PharmD, BCPS Clinical Pharmacist Phone: [phone]

## 2019-12-27 NOTE — Discharge Instructions (Signed)
Please go to ER for further evaluation of your vaginal discharge, lower abdominal pain, myalgias.

## 2019-12-28 ENCOUNTER — Inpatient Hospital Stay (HOSPITAL_COMMUNITY): Payer: Medicare Other

## 2019-12-28 LAB — BASIC METABOLIC PANEL
Anion gap: 9 (ref 5–15)
BUN: 7 mg/dL — ABNORMAL LOW (ref 8–23)
CO2: 26 mmol/L (ref 22–32)
Calcium: 9.1 mg/dL (ref 8.9–10.3)
Chloride: 105 mmol/L (ref 98–111)
Creatinine, Ser: 0.98 mg/dL (ref 0.44–1.00)
GFR calc Af Amer: >60 mL/min (ref >60)
GFR calc non Af Amer: 59 mL/min — ABNORMAL LOW (ref >60)
Glucose, Bld: 97 mg/dL (ref 70–99)
Potassium: 4 mmol/L (ref 3.5–5.1)
Sodium: 140 mmol/L (ref 135–145)

## 2019-12-28 LAB — CBC
HCT: 35 % — ABNORMAL LOW (ref 36.0–46.0)
Hemoglobin: 10.7 g/dL — ABNORMAL LOW (ref 12.0–15.0)
MCH: 27.9 pg (ref 26.0–34.0)
MCHC: 30.6 g/dL (ref 30.0–36.0)
MCV: 91.4 fL (ref 80.0–100.0)
Platelets: 350 10*3/uL (ref 150–400)
RBC: 3.83 MIL/uL — ABNORMAL LOW (ref 3.87–5.11)
RDW: 13.2 % (ref 11.5–15.5)
WBC: 9.8 10*3/uL (ref 4.0–10.5)
nRBC: 0 % (ref 0.0–0.2)

## 2019-12-28 LAB — SARS CORONAVIRUS 2 (TAT 6-24 HRS): SARS Coronavirus 2: NEGATIVE

## 2019-12-28 LAB — PROTIME-INR
INR: 1.1 (ref 0.8–1.2)
Prothrombin Time: 13.7 s (ref 11.4–15.2)

## 2019-12-28 MED ORDER — LEVOTHYROXINE SODIUM 50 MCG PO TABS
50.0000 ug | ORAL_TABLET | ORAL | Status: DC
  Administered 2019-12-28 – 2019-12-30 (×3): 50 ug via ORAL
  Filled 2019-12-28 (×3): qty 1

## 2019-12-28 MED ORDER — BUPROPION HCL ER (XL) 150 MG PO TB24
150.0000 mg | ORAL_TABLET | ORAL | Status: DC
  Administered 2019-12-29 – 2019-12-30 (×2): 150 mg via ORAL
  Filled 2019-12-28 (×3): qty 1

## 2019-12-28 MED ORDER — ALBUTEROL SULFATE (2.5 MG/3ML) 0.083% IN NEBU: 2.5000 mg | INHALATION_SOLUTION | RESPIRATORY_TRACT | Status: DC | PRN

## 2019-12-28 MED ORDER — FENTANYL CITRATE (PF) 100 MCG/2ML IJ SOLN
INTRAMUSCULAR | Status: AC
  Filled 2019-12-28: qty 4

## 2019-12-28 MED ORDER — UMECLIDINIUM BROMIDE 62.5 MCG/INH IN AEPB
1.0000 | INHALATION_SPRAY | Freq: Every day | RESPIRATORY_TRACT | Status: DC
  Administered 2019-12-30: 1 via RESPIRATORY_TRACT
  Filled 2019-12-28: qty 7

## 2019-12-28 MED ORDER — MIDAZOLAM HCL 2 MG/2ML IJ SOLN
INTRAMUSCULAR | Status: AC
  Filled 2019-12-28: qty 6

## 2019-12-28 MED ORDER — ENOXAPARIN SODIUM 40 MG/0.4ML ~~LOC~~ SOLN
40.0000 mg | SUBCUTANEOUS | Status: DC
  Administered 2019-12-28 – 2019-12-29 (×2): 40 mg via SUBCUTANEOUS
  Filled 2019-12-28 (×2): qty 0.4

## 2019-12-28 MED ORDER — LIDOCAINE-EPINEPHRINE 1 %-1:100000 IJ SOLN
INTRAMUSCULAR | Status: AC
  Filled 2019-12-28: qty 1

## 2019-12-28 MED ORDER — TRAZODONE HCL 100 MG PO TABS
100.0000 mg | ORAL_TABLET | Freq: Every day | ORAL | Status: DC
  Administered 2019-12-28 – 2019-12-29 (×2): 100 mg via ORAL
  Filled 2019-12-28 (×2): qty 1

## 2019-12-28 MED ORDER — MIDAZOLAM HCL 2 MG/2ML IJ SOLN
INTRAMUSCULAR | Status: AC | PRN
  Administered 2019-12-28 (×3): 1 mg via INTRAVENOUS

## 2019-12-28 MED ORDER — FENTANYL CITRATE (PF) 100 MCG/2ML IJ SOLN
INTRAMUSCULAR | Status: AC | PRN
  Administered 2019-12-28 (×3): 50 ug via INTRAVENOUS

## 2019-12-28 MED ORDER — SODIUM CHLORIDE 0.9% FLUSH
5.0000 mL | Freq: Three times a day (TID) | INTRAVENOUS | Status: DC
  Administered 2019-12-28 – 2019-12-30 (×6): 5 mL

## 2019-12-28 MED ORDER — AMLODIPINE BESYLATE 2.5 MG PO TABS
2.5000 mg | ORAL_TABLET | Freq: Every day | ORAL | Status: DC
  Administered 2019-12-29 – 2019-12-30 (×2): 2.5 mg via ORAL
  Filled 2019-12-28 (×2): qty 1

## 2019-12-28 NOTE — Progress Notes (Signed)
Patient ID: Jean Shaffer, female   DOB: 03/06/1951, 69 y.o.   MRN: [id number]       Subjective: Patient feeling slightly better today.  Still with vaginal drainage, but less than previously.  No nausea.  Some upper abdominal pain this am.  ROS: See above, otherwise other systems negative  Objective: Vital signs in last 24 hours: Temp:  [97.7 F (36.5 C)-97.8 F (36.6 C)] 97.7 F (36.5 C) (01/02 1445) Pulse Rate:  [59-74] 66 (01/03 0336) Resp:  [14-18] 16 (01/03 0336) BP: (121-190)/(50-84) 160/54 (01/03 0336) SpO2:  [95 %-98 %] 97 % (01/03 0336)    Intake/Output from previous day: 01/02 0701 - 01/03 0700 In: 1150 [IV Piggyback:1150] Out: -  Intake/Output this shift: No intake/output data recorded.  PE: Heart: regular Lungs: CTAB Abd: soft, mildly tender in epigastrium, but more tender in central pelvis, no guarding or rebounding, hypoactive BS, ND  Lab Results:  Recent Labs    12/27/19 1455 12/28/19 0558  WBC 10.5 9.8  HGB 11.3* 10.7*  HCT 37.0 35.0*  PLT 399 350   BMET Recent Labs    12/27/19 1455 12/28/19 0558  NA 137 140  K 4.4 4.0  CL 102 105  CO2 26 26  GLUCOSE 95 97  BUN 10 7*  CREATININE 0.98 0.98  CALCIUM 9.4 9.1   PT/INR No results for input(s): LABPROT, INR in the last 72 hours. CMP     Component Value Date/Time   NA 140 12/28/2019 0558   K 4.0 12/28/2019 0558   CL 105 12/28/2019 0558   CO2 26 12/28/2019 0558   GLUCOSE 97 12/28/2019 0558   BUN 7 (L) 12/28/2019 0558   CREATININE 0.98 12/28/2019 0558   CALCIUM 9.1 12/28/2019 0558   PROT 7.5 12/27/2019 1455   ALBUMIN 3.1 (L) 12/27/2019 1455   AST 14 (L) 12/27/2019 1455   ALT 13 12/27/2019 1455   ALKPHOS 89 12/27/2019 1455   BILITOT 0.5 12/27/2019 1455   GFRNONAA 59 (L) 12/28/2019 0558   GFRAA >60 12/28/2019 0558   Lipase  No results found for: LIPASE     Studies/Results: CT ABDOMEN PELVIS W CONTRAST  Result Date: 12/27/2019 CLINICAL DATA:  Pelvic abscess.  Nausea and  vomiting.  UTI. EXAM: CT ABDOMEN AND PELVIS WITH CONTRAST TECHNIQUE: Multidetector CT imaging of the abdomen and pelvis was performed using the standard protocol following bolus administration of intravenous contrast. CONTRAST:  117mL OMNIPAQUE IOHEXOL 300 MG/ML  SOLN COMPARISON:  None. FINDINGS: Lower chest: The lung bases are clear. The heart size is normal. Hepatobiliary: The liver is normal. Normal gallbladder.There is no biliary ductal dilation. Pancreas: Normal contours without ductal dilatation. No peripancreatic fluid collection. Spleen: No splenic laceration or hematoma. Adrenals/Urinary Tract: --Adrenal glands: No adrenal hemorrhage. --Right kidney/ureter: No hydronephrosis or perinephric hematoma. --Left kidney/ureter: No hydronephrosis or perinephric hematoma. --Urinary bladder: Unremarkable. Stomach/Bowel: --Stomach/Duodenum: No hiatal hernia or other gastric abnormality. Normal duodenal course and caliber. --Small bowel: No dilatation or inflammation. --Colon: No focal abnormality. --Appendix: The appendix is diffusely enlarged measuring up to approximately 9 mm proximally. There are likely appendicular lists throughout the appendix. The distal appendix appears to be ruptured with a developing abscess measuring approximately 2.7 x 4.9 cm located within the pelvis. The appendix is primarily located within the patient's pelvis. Vascular/Lymphatic: Atherosclerotic calcification is present within the non-aneurysmal abdominal aorta, without hemodynamically significant stenosis. --No retroperitoneal lymphadenopathy. --No mesenteric lymphadenopathy. --No pelvic or inguinal lymphadenopathy. Reproductive: Status post hysterectomy. No adnexal mass. Other: No  ascites or free air. The abdominal wall is normal. Musculoskeletal. No acute displaced fractures. Patient is status post posterior fusion from L4 through S1. Interbody spaces are noted at the L4-L5 and L5-S1 levels. IMPRESSION: 1. Overall findings concerning  for perforated appendicitis with a developing 4.9 x 2.6 cm abscess in the patient's pelvis. 2. No evidence for hydronephrosis. 3. Status post hysterectomy. 4.  Aortic Atherosclerosis (ICD10-I70.0). Electronically Signed   By: Constance Holster M.D.   On: 12/27/2019 20:52    Anti-infectives: Anti-infectives (From admission, onward)   Start     Dose/Rate Route Frequency Ordered Stop   12/28/19 0600  piperacillin-tazobactam (ZOSYN) IVPB 3.375 g     3.375 g 12.5 mL/hr over 240 Minutes Intravenous Every 8 hours 12/27/19 2328     12/27/19 2115  piperacillin-tazobactam (ZOSYN) IVPB 3.375 g     3.375 g 100 mL/hr over 30 Minutes Intravenous  Once 12/27/19 2109 12/27/19 2213   12/27/19 2115  metroNIDAZOLE (FLAGYL) IVPB 500 mg     500 mg 100 mL/hr over 60 Minutes Intravenous  Once 12/27/19 2109 12/27/19 2304       Assessment/Plan HTN - resume home meds hypothyroidism - resume home meds  COPD - resume home meds  Perforated appendicitis with abscess -cont zosyn -will have IR eval to see if they can drain this pelvic abscess.  If so we can hopefully continue conservative management and avoid acute surgical intervention at this time. -pulm toilet and IS -mobilize -cont NPO for now x meds  FEN - IVFs, NPO VTE - SCDs, hold lovenox for possible IR procedure, can resume following procedure ID - zosyn 1/2 -->   LOS: 1 day    Henreitta Cea , [facility] Surgery 12/28/2019, 7:57 AM Please see Amion for pager number during day hours 7:00am-4:30pm

## 2019-12-28 NOTE — Consult Note (Signed)
Chief Complaint: Patient was seen in consultation today for pelvic abscess/aspiration and drainage.  Referring Physician(s): Saverio Danker ([place])  Supervising Physician: Sandi Mariscal  Patient Status: [facility] - ED  History of Present Illness: Jean Shaffer is a 69 y.o. female with a past medical history of hypertension, COPD, thyroid disease, and chronic back pain. She presented to [facility] ED 12/27/2019 with complaints of pelvic pain and vaginal discharge. In ED, CT abdomen/pelvis revealed findings consistent with perforated appendicitis with associated pelvic abscess. CCS was consulted who recommended admission and IR consultation for possible drain placement.  CT abdomen/pelvis 12/27/2019: 1. Overall findings concerning for perforated appendicitis with a developing 4.9 x 2.6 cm abscess in the patient's pelvis. 2. No evidence for hydronephrosis. 3. Status post hysterectomy. 4.  Aortic Atherosclerosis (ICD10-I70.0).  IR consulted by Saverio Danker, PA-C for possible image-guided drain placement. Patient awake and alert laying in bed. Complains of pelvic pain, rated 5/10 at this time. Denies fever, chills, chest pain, dyspnea, or headache.   Past Medical History:  Diagnosis Date  . Chronic back pain   . COPD (chronic obstructive pulmonary disease) ([place])   . Hypertension   . Thyroid disease     Past Surgical History:  Procedure Laterality Date  . ABDOMINAL HYSTERECTOMY    . COLONOSCOPY    . NECK SURGERY    . SPINE SURGERY      Allergies: Patient has no known allergies.  Medications: Prior to Admission medications   Medication Sig Start Date End Date Taking? Authorizing Provider  albuterol (PROVENTIL HFA;VENTOLIN HFA) 108 (90 Base) MCG/ACT inhaler Inhale 2 puffs into the lungs every 4 (four) hours as needed for shortness of breath or wheezing. 07/30/15  Yes [provider]  amLODipine (NORVASC) 2.5 MG tablet Take 2.5 mg by mouth daily.   Yes [provider]    aspirin 81 MG tablet Take 81 mg by mouth every morning.   Yes [provider]  buPROPion (WELLBUTRIN XL) 150 MG 24 hr tablet Take 150 mg by mouth every morning. 09/11/18  Yes [provider]  cholecalciferol (VITAMIN D) 25 MCG (1000 UT) tablet Take 1,000 Units by mouth daily.   Yes [provider]  diclofenac sodium (VOLTAREN) 1 % GEL Apply 2 g topically 4 (four) times daily as needed (knee pain).  07/30/15  Yes [provider]  HYDROcodone-acetaminophen (NORCO/VICODIN) 5-325 MG per tablet Take 1-2 tablets by mouth every 6 hours as needed for pain. 02/10/15  Yes Pisciotta, Elmyra Ricks, PA-C  ibuprofen (ADVIL,MOTRIN) 200 MG tablet Take 200 mg by mouth every 6 (six) hours as needed for pain.   Yes [provider]  levothyroxine (SYNTHROID, LEVOTHROID) 50 MCG tablet Take 50 mcg by mouth every morning. 09/06/18  Yes [provider]  tiotropium (SPIRIVA HANDIHALER) 18 MCG inhalation capsule Take 1 puff by mouth daily. 10/08/18  Yes [provider]  traMADol (ULTRAM) 50 MG tablet Take 50 mg by mouth every 6 (six) hours as needed for pain.   Yes [provider]  traZODone (DESYREL) 100 MG tablet Take 100 mg by mouth at bedtime. 03/01/18  Yes [provider]  valACYclovir (VALTREX) 500 MG tablet Take 500 mg by mouth See admin instructions. Take 1 tablet by mouth twice daily for 3 days during outbreak 06/10/18  Yes [provider]     Family History  Problem Relation Age of Onset  . Alcohol abuse Father   . Cancer Father   . Heart disease Father   .  Cancer Mother   . Cancer Maternal Grandfather   . Heart disease Maternal Grandfather   . Heart disease Paternal Grandmother   . Diabetes Paternal Grandmother   . Diabetes Paternal Grandfather     Social History   Socioeconomic History  . Marital status: Single    Spouse name: Not on file  . Number of children: Not on file  . Years of education: Not on file  . Highest  education level: Not on file  Occupational History  . Not on file  Tobacco Use  . Smoking status: Former Research scientist (life sciences)  . Smokeless tobacco: Never Used  Substance and Sexual Activity  . Alcohol use: No  . Drug use: No  . Sexual activity: Not on file  Other Topics Concern  . Not on file  Social History Narrative  . Not on file   Social Determinants of Health   Financial Resource Strain:   . Difficulty of Paying Living Expenses: Not on file  Food Insecurity:   . Worried About Charity fundraiser in the Last Year: Not on file  . Ran Out of Food in the Last Year: Not on file  Transportation Needs:   . Lack of Transportation (Medical): Not on file  . Lack of Transportation (Non-Medical): Not on file  Physical Activity:   . Days of Exercise per Week: Not on file  . Minutes of Exercise per Session: Not on file  Stress:   . Feeling of Stress : Not on file  Social Connections:   . Frequency of Communication with Friends and Family: Not on file  . Frequency of Social Gatherings with Friends and Family: Not on file  . Attends Religious Services: Not on file  . Active Member of Clubs or Organizations: Not on file  . Attends Archivist Meetings: Not on file  . Marital Status: Not on file     Review of Systems: A 12 point ROS discussed and pertinent positives are indicated in the HPI above.  All other systems are negative.  Review of Systems  Constitutional: Negative for chills and fever.  Respiratory: Negative for shortness of breath and wheezing.   Cardiovascular: Negative for chest pain and palpitations.  Gastrointestinal: Positive for abdominal pain.  Neurological: Negative for headaches.  Psychiatric/Behavioral: Negative for behavioral problems and confusion.    Vital Signs: BP (!) 160/54 (BP Location: Left Arm)   Pulse 66   Temp 97.7 F (36.5 C) (Oral)   Resp 16   SpO2 97%   Physical Exam Vitals and nursing note reviewed.  Constitutional:      General: She is  not in acute distress.    Appearance: Normal appearance.  Cardiovascular:     Rate and Rhythm: Normal rate and regular rhythm.     Heart sounds: Normal heart sounds. No murmur.  Pulmonary:     Effort: Pulmonary effort is normal. No respiratory distress.     Breath sounds: Normal breath sounds. No wheezing.  Abdominal:     General: There is no distension.     Palpations: Abdomen is soft.     Tenderness: There is abdominal tenderness. There is no guarding.  Skin:    General: Skin is warm and dry.  Neurological:     Mental Status: She is alert and oriented to person, place, and time.  Psychiatric:        Mood and Affect: Mood normal.        Behavior: Behavior normal.      MD  Evaluation Airway: WNL Heart: WNL Abdomen: WNL Chest/ Lungs: WNL ASA  Classification: 2 Mallampati/Airway Score: Two   Imaging: CT ABDOMEN PELVIS W CONTRAST  Result Date: 12/27/2019 CLINICAL DATA:  Pelvic abscess.  Nausea and vomiting.  UTI. EXAM: CT ABDOMEN AND PELVIS WITH CONTRAST TECHNIQUE: Multidetector CT imaging of the abdomen and pelvis was performed using the standard protocol following bolus administration of intravenous contrast. CONTRAST:  136mL OMNIPAQUE IOHEXOL 300 MG/ML  SOLN COMPARISON:  None. FINDINGS: Lower chest: The lung bases are clear. The heart size is normal. Hepatobiliary: The liver is normal. Normal gallbladder.There is no biliary ductal dilation. Pancreas: Normal contours without ductal dilatation. No peripancreatic fluid collection. Spleen: No splenic laceration or hematoma. Adrenals/Urinary Tract: --Adrenal glands: No adrenal hemorrhage. --Right kidney/ureter: No hydronephrosis or perinephric hematoma. --Left kidney/ureter: No hydronephrosis or perinephric hematoma. --Urinary bladder: Unremarkable. Stomach/Bowel: --Stomach/Duodenum: No hiatal hernia or other gastric abnormality. Normal duodenal course and caliber. --Small bowel: No dilatation or inflammation. --Colon: No focal  abnormality. --Appendix: The appendix is diffusely enlarged measuring up to approximately 9 mm proximally. There are likely appendicular lists throughout the appendix. The distal appendix appears to be ruptured with a developing abscess measuring approximately 2.7 x 4.9 cm located within the pelvis. The appendix is primarily located within the patient's pelvis. Vascular/Lymphatic: Atherosclerotic calcification is present within the non-aneurysmal abdominal aorta, without hemodynamically significant stenosis. --No retroperitoneal lymphadenopathy. --No mesenteric lymphadenopathy. --No pelvic or inguinal lymphadenopathy. Reproductive: Status post hysterectomy. No adnexal mass. Other: No ascites or free air. The abdominal wall is normal. Musculoskeletal. No acute displaced fractures. Patient is status post posterior fusion from L4 through S1. Interbody spaces are noted at the L4-L5 and L5-S1 levels. IMPRESSION: 1. Overall findings concerning for perforated appendicitis with a developing 4.9 x 2.6 cm abscess in the patient's pelvis. 2. No evidence for hydronephrosis. 3. Status post hysterectomy. 4.  Aortic Atherosclerosis (ICD10-I70.0). Electronically Signed   By: Constance Holster M.D.   On: 12/27/2019 20:52    Labs:  CBC: Recent Labs    12/27/19 1455 12/28/19 0558  WBC 10.5 9.8  HGB 11.3* 10.7*  HCT 37.0 35.0*  PLT 399 350    COAGS: No results for input(s): INR, APTT in the last 8760 hours.  BMP: Recent Labs    12/27/19 1455 12/28/19 0558  NA 137 140  K 4.4 4.0  CL 102 105  CO2 26 26  GLUCOSE 95 97  BUN 10 7*  CALCIUM 9.4 9.1  CREATININE 0.98 0.98  GFRNONAA 59* 59*  GFRAA >60 >60    LIVER FUNCTION TESTS: Recent Labs    12/27/19 1455  BILITOT 0.5  AST 14*  ALT 13  ALKPHOS 89  PROT 7.5  ALBUMIN 3.1*     Assessment and Plan:  Pelvic abscess secondary to perforated appendicitis. Plan for image-guided right TG drain placement tentatively for today in IR. Patient is  NPO. Afebrile and WBCs WNL. She does not take blood thinners. INR pending.  Risks and benefits discussed with the patient including bleeding, infection, damage to adjacent structures, bowel perforation/fistula connection, and sepsis. All of the patient's questions were answered, patient is agreeable to proceed. Consent signed and in chart.   Thank you for this interesting consult.  I greatly enjoyed meeting Jean Shaffer and look forward to participating in their care.  A copy of this report was sent to the requesting provider on this date.  Electronically Signed: Earley Abide, PA-C 12/28/2019, 9:13 AM   I spent a total of 40  Minutes in face to face in clinical consultation, greater than 50% of which was counseling/coordinating care for pelvic abscess/aspiration and drainage.

## 2019-12-28 NOTE — ED Notes (Signed)
ED TO INPATIENT HANDOFF REPORT  ED Nurse Name and Phone #:   S Name/Age/Gender Jean Shaffer 69 y.o. female Room/Bed: H011C/H011C  Code Status   Code Status: Full Code  Home/SNF/Other Home Patient oriented to: self, place, time and situation Is this baseline? Yes   Triage Complete: Triage complete  Chief Complaint Perforated appendicitis [K35.32]  Triage Note  Green drainage aND BLOOD FROM VAGINA X 1 DAY  [facility] AND WAS SENT HERE TO RULE OUT ABCESS IN VAGINAL AREA , ABD PAIN AND LOW2ER BACK PAIN    Allergies No Known Allergies  Level of Care/Admitting Diagnosis ED Disposition    ED Disposition Condition Comment   Admit  Hospital Area: [facility] [[id number]]  Level of Care: Med-Surg [16]  Covid Evaluation: Asymptomatic Screening Protocol (No Symptoms)  Diagnosis: Perforated appendicitis [id number]  Admitting Physician: [place], [place]  Attending Physician: WAKEFIELD, MATTHEW [[id number]]  Estimated length of stay: 3 - 4 days  Certification:: I certify this patient will need inpatient services for at least 2 midnights       B Medical/Surgery History Past Medical History:  Diagnosis Date  . Chronic back pain   . COPD (chronic obstructive pulmonary disease) ([place])   . Hypertension   . Thyroid disease    Past Surgical History:  Procedure Laterality Date  . ABDOMINAL HYSTERECTOMY    . COLONOSCOPY    . NECK SURGERY    . SPINE SURGERY       A IV Location/Drains/Wounds Patient Lines/Drains/Airways Status   Active Line/Drains/Airways    Name:   Placement date:   Placement time:   Site:   Days:   Peripheral IV 12/27/19 Right Forearm   12/27/19    1805    Forearm   1   Closed System Drain 1 Right Buttock Other (Comment) 12 Fr.   12/28/19    1124    Buttock   less than 1          Intake/Output Last 24 hours  Intake/Output Summary (Last 24 hours) at 12/28/2019 1620 Last data filed at 12/28/2019 1300 Gross per 24 hour  Intake  1200 ml  Output --  Net 1200 ml    Labs/Imaging Results for orders placed or performed during the hospital encounter of 12/27/19 (from the past 48 hour(s))  CBC with Differential     Status: Abnormal   Collection Time: 12/27/19  2:55 PM  Result Value Ref Range   WBC 10.5 4.0 - 10.5 K/uL   RBC 4.04 3.87 - 5.11 MIL/uL   Hemoglobin 11.3 (L) 12.0 - 15.0 g/dL   HCT 37.0 36.0 - 46.0 %   MCV 91.6 80.0 - 100.0 fL   MCH 28.0 26.0 - 34.0 pg   MCHC 30.5 30.0 - 36.0 g/dL   RDW 13.2 11.5 - 15.5 %   Platelets 399 150 - 400 K/uL   nRBC 0.0 0.0 - 0.2 %   Neutrophils Relative % 59 %   Neutro Abs 6.3 1.7 - 7.7 K/uL   Lymphocytes Relative 31 %   Lymphs Abs 3.2 0.7 - 4.0 K/uL   Monocytes Relative 8 %   Monocytes Absolute 0.8 0.1 - 1.0 K/uL   Eosinophils Relative 1 %   Eosinophils Absolute 0.1 0.0 - 0.5 K/uL   Basophils Relative 1 %   Basophils Absolute 0.1 0.0 - 0.1 K/uL   Immature Granulocytes 0 %   Abs Immature Granulocytes 0.04 0.00 - 0.07 K/uL    Comment:  Performed at [facility], [place] [address], [place], [place] [address]  Comprehensive metabolic panel     Status: Abnormal   Collection Time: 12/27/19  2:55 PM  Result Value Ref Range   Sodium 137 135 - 145 mmol/L   Potassium 4.4 3.5 - 5.1 mmol/L   Chloride 102 98 - 111 mmol/L   CO2 26 22 - 32 mmol/L   Glucose, Bld 95 70 - 99 mg/dL   BUN 10 8 - 23 mg/dL   Creatinine, Ser 0.98 0.44 - 1.00 mg/dL   Calcium 9.4 8.9 - 10.3 mg/dL   Total Protein 7.5 6.5 - 8.1 g/dL   Albumin 3.1 (L) 3.5 - 5.0 g/dL   AST 14 (L) 15 - 41 U/L   ALT 13 0 - 44 U/L   Alkaline Phosphatase 89 38 - 126 U/L   Total Bilirubin 0.5 0.3 - 1.2 mg/dL   GFR calc non Af Amer 59 (L) >60 mL/min   GFR calc Af Amer >60 >60 mL/min   Anion gap 9 5 - 15    Comment: Performed at [facility] [address], [place], [place] [address]  Lactic acid, plasma     Status: None   Collection Time: 12/27/19  3:00 PM  Result Value Ref Range   Lactic Acid, Venous 0.7 0.5 -  1.9 mmol/L    Comment: Performed at [facility] [address], [place], [place] [address]  SARS CORONAVIRUS 2 (TAT 6-24 HRS) Nasopharyngeal Nasopharyngeal Swab     Status: None   Collection Time: 12/27/19 11:06 PM   Specimen: Nasopharyngeal Swab  Result Value Ref Range   SARS Coronavirus 2 NEGATIVE NEGATIVE    Comment: (NOTE) SARS-CoV-2 target nucleic acids are NOT DETECTED. The SARS-CoV-2 RNA is generally detectable in upper and lower respiratory specimens during the acute phase of infection. Negative results do not preclude SARS-CoV-2 infection, do not rule out co-infections with other pathogens, and should not be used as the sole basis for treatment or other patient management decisions. Negative results must be combined with clinical observations, patient history, and epidemiological information. The expected result is Negative. Fact Sheet for Patients: SugarRoll.be Fact Sheet for Healthcare Providers: https://www.woods-mathews.com/ This test is not yet approved or cleared by the [place] FDA and  has been authorized for detection and/or diagnosis of SARS-CoV-2 by FDA under an Emergency Use Authorization (EUA). This EUA will remain  in effect (meaning this test can be used) for the duration of the COVID-19 declaration under Section 56 4(b)(1) of the Act, 21 U.S.C. section 360bbb-3(b)(1), unless the authorization is terminated or revoked sooner. Performed at [facility], [place] [address], [place], [place] [id number]   Basic metabolic panel     Status: Abnormal   Collection Time: 12/28/19  5:58 AM  Result Value Ref Range   Sodium 140 135 - 145 mmol/L   Potassium 4.0 3.5 - 5.1 mmol/L   Chloride 105 98 - 111 mmol/L   CO2 26 22 - 32 mmol/L   Glucose, Bld 97 70 - 99 mg/dL   BUN 7 (L) 8 - 23 mg/dL   Creatinine, Ser 0.98 0.44 - 1.00 mg/dL   Calcium 9.1 8.9 - 10.3 mg/dL   GFR calc non Af Amer 59 (L) >60 mL/min   GFR calc  Af Amer >60 >60 mL/min   Anion gap 9 5 - 15    Comment: Performed at [facility], [place],  [place] [address]  CBC     Status: Abnormal   Collection Time: 12/28/19  5:58 AM  Result Value Ref Range   WBC 9.8 4.0 - 10.5 K/uL   RBC 3.83 (L) 3.87 - 5.11 MIL/uL   Hemoglobin 10.7 (L) 12.0 - 15.0 g/dL   HCT 35.0 (L) 36.0 - 46.0 %   MCV 91.4 80.0 - 100.0 fL   MCH 27.9 26.0 - 34.0 pg   MCHC 30.6 30.0 - 36.0 g/dL   RDW 13.2 11.5 - 15.5 %   Platelets 350 150 - 400 K/uL   nRBC 0.0 0.0 - 0.2 %    Comment: Performed at [facility], [place] [address], [place],  [address]   CT ABDOMEN PELVIS W CONTRAST  Result Date: 12/27/2019 CLINICAL DATA:  Pelvic abscess.  Nausea and vomiting.  UTI. EXAM: CT ABDOMEN AND PELVIS WITH CONTRAST TECHNIQUE: Multidetector CT imaging of the abdomen and pelvis was performed using the standard protocol following bolus administration of intravenous contrast. CONTRAST:  18mL OMNIPAQUE IOHEXOL 300 MG/ML  SOLN COMPARISON:  None. FINDINGS: Lower chest: The lung bases are clear. The heart size is normal. Hepatobiliary: The liver is normal. Normal gallbladder.There is no biliary ductal dilation. Pancreas: Normal contours without ductal dilatation. No peripancreatic fluid collection. Spleen: No splenic laceration or hematoma. Adrenals/Urinary Tract: --Adrenal glands: No adrenal hemorrhage. --Right kidney/ureter: No hydronephrosis or perinephric hematoma. --Left kidney/ureter: No hydronephrosis or perinephric hematoma. --Urinary bladder: Unremarkable. Stomach/Bowel: --Stomach/Duodenum: No hiatal hernia or other gastric abnormality. Normal duodenal course and caliber. --Small bowel: No dilatation or inflammation. --Colon: No focal abnormality. --Appendix: The appendix is diffusely enlarged measuring up to approximately 9 mm proximally. There are likely appendicular lists throughout the appendix. The distal appendix appears to be ruptured with a developing abscess  measuring approximately 2.7 x 4.9 cm located within the pelvis. The appendix is primarily located within the patient's pelvis. Vascular/Lymphatic: Atherosclerotic calcification is present within the non-aneurysmal abdominal aorta, without hemodynamically significant stenosis. --No retroperitoneal lymphadenopathy. --No mesenteric lymphadenopathy. --No pelvic or inguinal lymphadenopathy. Reproductive: Status post hysterectomy. No adnexal mass. Other: No ascites or free air. The abdominal wall is normal. Musculoskeletal. No acute displaced fractures. Patient is status post posterior fusion from L4 through S1. Interbody spaces are noted at the L4-L5 and L5-S1 levels. IMPRESSION: 1. Overall findings concerning for perforated appendicitis with a developing 4.9 x 2.6 cm abscess in the patient's pelvis. 2. No evidence for hydronephrosis. 3. Status post hysterectomy. 4.  Aortic Atherosclerosis (ICD10-I70.0). Electronically Signed   By: Constance Holster M.D.   On: 12/27/2019 20:52   CT IMAGE GUIDED DRAINAGE BY PERCUTANEOUS CATHETER  Result Date: 12/28/2019 INDICATION: History of perforated appendicitis, now with pelvic abscess. Please perform CT-guided percutaneous drainage catheter placement for infection source control purposes. EXAM: CT IMAGE GUIDED DRAINAGE BY PERCUTANEOUS CATHETER COMPARISON:  CT abdomen and pelvis-01/16/2020 MEDICATIONS: The patient is currently admitted to the hospital and receiving intravenous antibiotics. The antibiotics were administered within an appropriate time frame prior to the initiation of the procedure. ANESTHESIA/SEDATION: Moderate (conscious) sedation was employed during this procedure. A total of Versed 3 mg and Fentanyl 150 mcg was administered intravenously. Moderate Sedation Time: 14 minutes. The patient's level of consciousness and vital signs were monitored continuously by radiology nursing throughout the procedure under my direct supervision. CONTRAST:  None COMPLICATIONS: None  immediate. PROCEDURE: Informed written consent was obtained from the patient after a discussion of the risks, benefits and alternatives to treatment. The patient was placed prone on  the CT gantry and a pre procedural CT was performed re-demonstrating the known abscess/fluid collection within the lower pelvis with dominant component measuring approximately 5.8 x 2.6 cm (image 22, series 3). The procedure was planned. A timeout was performed prior to the initiation of the procedure. The right buttocks was prepped and draped in the usual sterile fashion. The overlying soft tissues were anesthetized with 1% lidocaine with epinephrine. Appropriate trajectory was planned with the use of a 22 gauge spinal needle. An 18 gauge trocar needle was advanced into the abscess/fluid collection and a short Amplatz super stiff wire was coiled within the collection. Appropriate positioning was confirmed with a limited CT scan. The tract was serially dilated allowing placement of a 12 [place] all-purpose drainage catheter. Appropriate positioning was confirmed with a limited postprocedural CT scan. Approximately 15 ml of purulent fluid was aspirated. The tube was connected to a drainage bag and sutured in place. A dressing was placed. The patient tolerated the procedure well without immediate post procedural complication. IMPRESSION: Successful CT guided placement of a 45 French all purpose drain catheter into the lower pelvis via right trans gluteal approach with aspiration of 15 mL of purulent fluid. Samples were sent to the laboratory as requested by the ordering clinical team. Electronically Signed   By: Sandi Mariscal M.D.   On: 12/28/2019 11:46    Pending Labs Unresulted Labs (From admission, onward)    Start     Ordered   12/29/19 0500  CBC  Tomorrow morning,   R     12/28/19 1213   12/29/19 [id number]  Basic metabolic panel  Tomorrow morning,   R     12/28/19 1213   12/29/19 0500  Magnesium  Tomorrow morning,   R     12/28/19  1213   12/28/19 1124  Aerobic/Anaerobic Culture (surgical/deep wound)  Once,   STAT    Comments: Post CT guided right TG drain placement.   Question:  Patient immune status  Answer:  Normal   12/28/19 1123   12/28/19 0813  Protime-INR  ONCE - STAT,   STAT     12/28/19 0812          Vitals/Pain Today's Vitals   12/28/19 1430 12/28/19 1616 12/28/19 1619 12/28/19 1619  BP:    (!) 141/64  Pulse:    (!) 58  Resp:    18  Temp:    98.9 F (37.2 C)  TempSrc:    Oral  SpO2:    98%  PainSc: 10-Worst pain ever 9  9      Isolation Precautions No active isolations  Medications Medications  0.9 %  sodium chloride infusion ( Intravenous New Bag/Given 12/28/19 0138)  acetaminophen (TYLENOL) tablet 650 mg (has no administration in time range)    Or  acetaminophen (TYLENOL) suppository 650 mg (has no administration in time range)  oxyCODONE (Oxy IR/ROXICODONE) immediate release tablet 5-10 mg (10 mg Oral Given 12/28/19 1430)  morphine 2 MG/ML injection 2 mg (2 mg Intravenous Given 12/28/19 0138)  methocarbamol (ROBAXIN) tablet 500 mg (has no administration in time range)  ondansetron (ZOFRAN-ODT) disintegrating tablet 4 mg (has no administration in time range)    Or  ondansetron (ZOFRAN) injection 4 mg (has no administration in time range)  simethicone (MYLICON) chewable tablet 40 mg (has no administration in time range)  metoprolol tartrate (LOPRESSOR) injection 5 mg (has no administration in time range)  piperacillin-tazobactam (ZOSYN) IVPB 3.375 g (3.375 g Intravenous New Bag/Given 12/28/19 1606)  albuterol (PROVENTIL) (2.5 MG/3ML) 0.083% nebulizer solution 2.5 mg (has no administration in time range)  amLODipine (NORVASC) tablet 2.5 mg (2.5 mg Oral Not Given 12/28/19 1100)  buPROPion (WELLBUTRIN XL) 24 hr tablet 150 mg (150 mg Oral Not Given 12/28/19 1615)  levothyroxine (SYNTHROID) tablet 50 mcg (50 mcg Oral Given 12/28/19 1615)  umeclidinium bromide (INCRUSE ELLIPTA) 62.5 MCG/INH 1 puff (has no  administration in time range)  traZODone (DESYREL) tablet 100 mg (has no administration in time range)  lidocaine-EPINEPHrine (XYLOCAINE W/EPI) 1 %-1:100000 (with pres) injection (has no administration in time range)  midazolam (VERSED) 2 MG/2ML injection (has no administration in time range)  fentaNYL (SUBLIMAZE) 100 MCG/2ML injection (has no administration in time range)  sodium chloride flush (NS) 0.9 % injection 5 mL (5 mLs Intracatheter Given 12/28/19 1618)  enoxaparin (LOVENOX) injection 40 mg (has no administration in time range)  amLODipine (NORVASC) tablet 10 mg (10 mg Oral Given 12/27/19 1952)  HYDROcodone-acetaminophen (NORCO/VICODIN) 5-325 MG per tablet 1 tablet (1 tablet Oral Given 12/27/19 2054)  iohexol (OMNIPAQUE) 300 MG/ML solution 100 mL (100 mLs Intravenous Contrast Given 12/27/19 2024)  piperacillin-tazobactam (ZOSYN) IVPB 3.375 g (0 g Intravenous Stopped 12/27/19 2213)  metroNIDAZOLE (FLAGYL) IVPB 500 mg (0 mg Intravenous Stopped 12/27/19 2304)  lactated ringers bolus 1,000 mL (0 mLs Intravenous Stopped 12/28/19 0139)  midazolam (VERSED) injection (1 mg Intravenous Given 12/28/19 1113)  fentaNYL (SUBLIMAZE) injection (50 mcg Intravenous Given 12/28/19 1113)    Mobility walks Low fall risk   Focused Assessments Pulmonary Assessment Handoff:  Lung sounds:   O2 Device: Room Air O2 Flow Rate (L/min): 2 L/min      R Recommendations: See Admitting Provider Note  Report given to:   Additional Notes:

## 2019-12-28 NOTE — ED Notes (Signed)
Pt able to ambulate to and from bathroom without assist with IV pole

## 2019-12-28 NOTE — Procedures (Signed)
Pre procedural Dx: Perforated appendicitis Post procedural Dx: Same  Technically successful CT guided placed of a 10 Fr drainage catheter placement into the pelvic abscess via right transgluteal approach yielding 15 cc of purulent material.    A representative aspirated sample was capped and sent to the laboratory for analysis.    EBL: Trace Complications: None immediate  Ronny Bacon, MD Pager #: [phone]

## 2019-12-29 ENCOUNTER — Encounter (HOSPITAL_COMMUNITY): Payer: Self-pay | Admitting: General Surgery

## 2019-12-29 LAB — MAGNESIUM: Magnesium: 2 mg/dL (ref 1.7–2.4)

## 2019-12-29 LAB — CBC
HCT: 32.6 % — ABNORMAL LOW (ref 36.0–46.0)
Hemoglobin: 10.1 g/dL — ABNORMAL LOW (ref 12.0–15.0)
MCH: 28.1 pg (ref 26.0–34.0)
MCHC: 31 g/dL (ref 30.0–36.0)
MCV: 90.8 fL (ref 80.0–100.0)
Platelets: 338 10*3/uL (ref 150–400)
RBC: 3.59 MIL/uL — ABNORMAL LOW (ref 3.87–5.11)
RDW: 13.4 % (ref 11.5–15.5)
WBC: 9.3 10*3/uL (ref 4.0–10.5)
nRBC: 0 % (ref 0.0–0.2)

## 2019-12-29 LAB — BASIC METABOLIC PANEL
Anion gap: 7 (ref 5–15)
BUN: 8 mg/dL (ref 8–23)
CO2: 25 mmol/L (ref 22–32)
Calcium: 8.7 mg/dL — ABNORMAL LOW (ref 8.9–10.3)
Chloride: 106 mmol/L (ref 98–111)
Creatinine, Ser: 1.15 mg/dL — ABNORMAL HIGH (ref 0.44–1.00)
GFR calc Af Amer: 56 mL/min — ABNORMAL LOW (ref >60)
GFR calc non Af Amer: 49 mL/min — ABNORMAL LOW (ref >60)
Glucose, Bld: 102 mg/dL — ABNORMAL HIGH (ref 70–99)
Potassium: 4.2 mmol/L (ref 3.5–5.1)
Sodium: 138 mmol/L (ref 135–145)

## 2019-12-29 LAB — GC/CHLAMYDIA PROBE AMP (~~LOC~~) NOT AT ARMC
Chlamydia: NEGATIVE
Neisseria Gonorrhea: NEGATIVE

## 2019-12-29 NOTE — Progress Notes (Signed)
[facility] Progress Note     Subjective: CC-  Drain placed by IR yesterday. Sore but overall feeling ok. Some nausea last night, improved this AM. Tolerating jello. No flatus or BM.  Objective: Vital signs in last 24 hours: Temp:  [98 F (36.7 C)-98.9 F (37.2 C)] 98.8 F (37.1 C) (01/04 0545) Pulse Rate:  [58-76] 73 (01/04 0545) Resp:  [12-20] 16 (01/04 0545) BP: (101-149)/(39-64) 125/61 (01/04 0545) SpO2:  [94 %-100 %] 94 % (01/04 0545) Last BM Date: 12/25/19  Intake/Output from previous day: 01/03 0701 - 01/04 0700 In: 1425.1 [P.O.:240; I.V.:1130.1; IV Piggyback:50] Out: 25 [Drains:25] Intake/Output this shift: No intake/output data recorded.  PE: Gen:  Alert, NAD, pleasant HEENT: EOM's intact, pupils equal and round Pulm:  Rate and effort normal Abd: Soft, ND, +BS, no HSM, no hernia, mild central abdominal TTP without rebound or guarding, drain with scant bloody appearing fluid in bag Psych: A&Ox3  Skin: no rashes noted, warm and dry  Lab Results:  Recent Labs    12/28/19 0558 12/29/19 0624  WBC 9.8 9.3  HGB 10.7* 10.1*  HCT 35.0* 32.6*  PLT 350 338   BMET Recent Labs    12/27/19 1455 12/28/19 0558  NA 137 140  K 4.4 4.0  CL 102 105  CO2 26 26  GLUCOSE 95 97  BUN 10 7*  CREATININE 0.98 0.98  CALCIUM 9.4 9.1   PT/INR Recent Labs    12/28/19 1722  LABPROT 13.7  INR 1.1   CMP     Component Value Date/Time   NA 140 12/28/2019 0558   K 4.0 12/28/2019 0558   CL 105 12/28/2019 0558   CO2 26 12/28/2019 0558   GLUCOSE 97 12/28/2019 0558   BUN 7 (L) 12/28/2019 0558   CREATININE 0.98 12/28/2019 0558   CALCIUM 9.1 12/28/2019 0558   PROT 7.5 12/27/2019 1455   ALBUMIN 3.1 (L) 12/27/2019 1455   AST 14 (L) 12/27/2019 1455   ALT 13 12/27/2019 1455   ALKPHOS 89 12/27/2019 1455   BILITOT 0.5 12/27/2019 1455   GFRNONAA 59 (L) 12/28/2019 0558   GFRAA >60 12/28/2019 0558   Lipase  No results found for:  LIPASE     Studies/Results: CT ABDOMEN PELVIS W CONTRAST  Result Date: 12/27/2019 CLINICAL DATA:  Pelvic abscess.  Nausea and vomiting.  UTI. EXAM: CT ABDOMEN AND PELVIS WITH CONTRAST TECHNIQUE: Multidetector CT imaging of the abdomen and pelvis was performed using the standard protocol following bolus administration of intravenous contrast. CONTRAST:  170mL OMNIPAQUE IOHEXOL 300 MG/ML  SOLN COMPARISON:  None. FINDINGS: Lower chest: The lung bases are clear. The heart size is normal. Hepatobiliary: The liver is normal. Normal gallbladder.There is no biliary ductal dilation. Pancreas: Normal contours without ductal dilatation. No peripancreatic fluid collection. Spleen: No splenic laceration or hematoma. Adrenals/Urinary Tract: --Adrenal glands: No adrenal hemorrhage. --Right kidney/ureter: No hydronephrosis or perinephric hematoma. --Left kidney/ureter: No hydronephrosis or perinephric hematoma. --Urinary bladder: Unremarkable. Stomach/Bowel: --Stomach/Duodenum: No hiatal hernia or other gastric abnormality. Normal duodenal course and caliber. --Small bowel: No dilatation or inflammation. --Colon: No focal abnormality. --Appendix: The appendix is diffusely enlarged measuring up to approximately 9 mm proximally. There are likely appendicular lists throughout the appendix. The distal appendix appears to be ruptured with a developing abscess measuring approximately 2.7 x 4.9 cm located within the pelvis. The appendix is primarily located within the patient's pelvis. Vascular/Lymphatic: Atherosclerotic calcification is present within the non-aneurysmal abdominal aorta, without hemodynamically significant stenosis. --No retroperitoneal lymphadenopathy. --  No mesenteric lymphadenopathy. --No pelvic or inguinal lymphadenopathy. Reproductive: Status post hysterectomy. No adnexal mass. Other: No ascites or free air. The abdominal wall is normal. Musculoskeletal. No acute displaced fractures. Patient is status post  posterior fusion from L4 through S1. Interbody spaces are noted at the L4-L5 and L5-S1 levels. IMPRESSION: 1. Overall findings concerning for perforated appendicitis with a developing 4.9 x 2.6 cm abscess in the patient's pelvis. 2. No evidence for hydronephrosis. 3. Status post hysterectomy. 4.  Aortic Atherosclerosis (ICD10-I70.0). Electronically Signed   By: Constance Holster M.D.   On: 12/27/2019 20:52   CT IMAGE GUIDED DRAINAGE BY PERCUTANEOUS CATHETER  Result Date: 12/28/2019 INDICATION: History of perforated appendicitis, now with pelvic abscess. Please perform CT-guided percutaneous drainage catheter placement for infection source control purposes. EXAM: CT IMAGE GUIDED DRAINAGE BY PERCUTANEOUS CATHETER COMPARISON:  CT abdomen and pelvis-01/16/2020 MEDICATIONS: The patient is currently admitted to the hospital and receiving intravenous antibiotics. The antibiotics were administered within an appropriate time frame prior to the initiation of the procedure. ANESTHESIA/SEDATION: Moderate (conscious) sedation was employed during this procedure. A total of Versed 3 mg and Fentanyl 150 mcg was administered intravenously. Moderate Sedation Time: 14 minutes. The patient's level of consciousness and vital signs were monitored continuously by radiology nursing throughout the procedure under my direct supervision. CONTRAST:  None COMPLICATIONS: None immediate. PROCEDURE: Informed written consent was obtained from the patient after a discussion of the risks, benefits and alternatives to treatment. The patient was placed prone on the CT gantry and a pre procedural CT was performed re-demonstrating the known abscess/fluid collection within the lower pelvis with dominant component measuring approximately 5.8 x 2.6 cm (image 22, series 3). The procedure was planned. A timeout was performed prior to the initiation of the procedure. The right buttocks was prepped and draped in the usual sterile fashion. The overlying soft  tissues were anesthetized with 1% lidocaine with epinephrine. Appropriate trajectory was planned with the use of a 22 gauge spinal needle. An 18 gauge trocar needle was advanced into the abscess/fluid collection and a short Amplatz super stiff wire was coiled within the collection. Appropriate positioning was confirmed with a limited CT scan. The tract was serially dilated allowing placement of a 12 [place] all-purpose drainage catheter. Appropriate positioning was confirmed with a limited postprocedural CT scan. Approximately 15 ml of purulent fluid was aspirated. The tube was connected to a drainage bag and sutured in place. A dressing was placed. The patient tolerated the procedure well without immediate post procedural complication. IMPRESSION: Successful CT guided placement of a 64 French all purpose drain catheter into the lower pelvis via right trans gluteal approach with aspiration of 15 mL of purulent fluid. Samples were sent to the laboratory as requested by the ordering clinical team. Electronically Signed   By: Sandi Mariscal M.D.   On: 12/28/2019 11:46    Anti-infectives: Anti-infectives (From admission, onward)   Start     Dose/Rate Route Frequency Ordered Stop   12/28/19 0600  piperacillin-tazobactam (ZOSYN) IVPB 3.375 g     3.375 g 12.5 mL/hr over 240 Minutes Intravenous Every 8 hours 12/27/19 2328     12/27/19 2115  piperacillin-tazobactam (ZOSYN) IVPB 3.375 g     3.375 g 100 mL/hr over 30 Minutes Intravenous  Once 12/27/19 2109 12/27/19 2213   12/27/19 2115  metroNIDAZOLE (FLAGYL) IVPB 500 mg     500 mg 100 mL/hr over 60 Minutes Intravenous  Once 12/27/19 2109 12/27/19 2304  Assessment/Plan HTN - home meds hypothyroidism - home meds  COPD - home meds  Perforated appendicitis with abscess - s/p IR drain 1/3, GS with gram positive cocci and negative rods, culture pending  FEN - IVFs, FLD VTE - SCDs, lovenox ID - zosyn 1/2 -->day#3 Follow up - Dr. Donne Hazel  Plan:  Continue drain and IV antibiotics, follow culture. Mobilize today. Advance to full liquids, await return in bowel function before advancing further. Labs in AM.   LOS: 2 days    Wellington [place], [facility] 12/29/2019, 8:05 AM Please see Amion for pager number during day hours 7:00am-4:30pm

## 2019-12-29 NOTE — Progress Notes (Addendum)
Referring Physician(s): Dr Deland Pretty  Supervising Physician: Sandi Mariscal  Patient Status:  [facility] - In-pt  Chief Complaint:  Perforated appendicitiswith abscess - s/p IR drain 1/3,  Subjective:  Resting Feels better-- less pain; less pressure Up in room   Allergies: Patient has no known allergies.  Medications: Prior to Admission medications   Medication Sig Start Date End Date Taking? Authorizing Provider  albuterol (PROVENTIL HFA;VENTOLIN HFA) 108 (90 Base) MCG/ACT inhaler Inhale 2 puffs into the lungs every 4 (four) hours as needed for shortness of breath or wheezing. 07/30/15  Yes [provider]  amLODipine (NORVASC) 2.5 MG tablet Take 2.5 mg by mouth daily.   Yes [provider]  aspirin 81 MG tablet Take 81 mg by mouth every morning.   Yes [provider]  buPROPion (WELLBUTRIN XL) 150 MG 24 hr tablet Take 150 mg by mouth every morning. 09/11/18  Yes [provider]  cholecalciferol (VITAMIN D) 25 MCG (1000 UT) tablet Take 1,000 Units by mouth daily.   Yes [provider]  diclofenac sodium (VOLTAREN) 1 % GEL Apply 2 g topically 4 (four) times daily as needed (knee pain).  07/30/15  Yes [provider]  HYDROcodone-acetaminophen (NORCO/VICODIN) 5-325 MG per tablet Take 1-2 tablets by mouth every 6 hours as needed for pain. 02/10/15  Yes Pisciotta, Elmyra Ricks, PA-C  ibuprofen (ADVIL,MOTRIN) 200 MG tablet Take 200 mg by mouth every 6 (six) hours as needed for pain.   Yes [provider]  levothyroxine (SYNTHROID, LEVOTHROID) 50 MCG tablet Take 50 mcg by mouth every morning. 09/06/18  Yes [provider]  tiotropium (SPIRIVA HANDIHALER) 18 MCG inhalation capsule Take 1 puff by mouth daily. 10/08/18  Yes [provider]  traMADol (ULTRAM) 50 MG tablet Take 50 mg by mouth every 6 (six) hours as needed for pain.   Yes [provider]  traZODone (DESYREL) 100 MG tablet Take 100 mg by mouth at bedtime.  03/01/18  Yes [provider]  valACYclovir (VALTREX) 500 MG tablet Take 500 mg by mouth See admin instructions. Take 1 tablet by mouth twice daily for 3 days during outbreak 06/10/18  Yes [provider]     Vital Signs: BP (!) 125/59 (BP Location: Left Arm)   Pulse 62   Temp 98.9 F (37.2 C) (Oral)   Resp 18   SpO2 97%   Physical Exam Vitals reviewed.  Skin:    General: Skin is warm and dry.     Comments: Site clean and dry NT No bleeding OP purulent 25 cc yesterday--- 20 cc in bag  Gram Stain ABUNDANT WBC PRESENT, PREDOMINANTLY PMN  ABUNDANT GRAM POSITIVE COCCI IN PAIRS IN CLUSTERS  MODERATE GRAM NEGATIVE RODS  Culture NO GROWTH < 24 HOURS     Neurological:     Mental Status: She is alert.     Imaging: CT ABDOMEN PELVIS W CONTRAST  Result Date: 12/27/2019 CLINICAL DATA:  Pelvic abscess.  Nausea and vomiting.  UTI. EXAM: CT ABDOMEN AND PELVIS WITH CONTRAST TECHNIQUE: Multidetector CT imaging of the abdomen and pelvis was performed using the standard protocol following bolus administration of intravenous contrast. CONTRAST:  116mL OMNIPAQUE IOHEXOL 300 MG/ML  SOLN COMPARISON:  None. FINDINGS: Lower chest: The lung bases are clear. The heart size is normal. Hepatobiliary: The liver is normal. Normal gallbladder.There is no biliary ductal dilation. Pancreas: Normal contours without ductal dilatation. No peripancreatic fluid collection. Spleen: No splenic laceration or hematoma. Adrenals/Urinary Tract: --Adrenal glands: No  adrenal hemorrhage. --Right kidney/ureter: No hydronephrosis or perinephric hematoma. --Left kidney/ureter: No hydronephrosis or perinephric hematoma. --Urinary bladder: Unremarkable. Stomach/Bowel: --Stomach/Duodenum: No hiatal hernia or other gastric abnormality. Normal duodenal course and caliber. --Small bowel: No dilatation or inflammation. --Colon: No focal abnormality. --Appendix: The appendix is diffusely enlarged measuring up to  approximately 9 mm proximally. There are likely appendicular lists throughout the appendix. The distal appendix appears to be ruptured with a developing abscess measuring approximately 2.7 x 4.9 cm located within the pelvis. The appendix is primarily located within the patient's pelvis. Vascular/Lymphatic: Atherosclerotic calcification is present within the non-aneurysmal abdominal aorta, without hemodynamically significant stenosis. --No retroperitoneal lymphadenopathy. --No mesenteric lymphadenopathy. --No pelvic or inguinal lymphadenopathy. Reproductive: Status post hysterectomy. No adnexal mass. Other: No ascites or free air. The abdominal wall is normal. Musculoskeletal. No acute displaced fractures. Patient is status post posterior fusion from L4 through S1. Interbody spaces are noted at the L4-L5 and L5-S1 levels. IMPRESSION: 1. Overall findings concerning for perforated appendicitis with a developing 4.9 x 2.6 cm abscess in the patient's pelvis. 2. No evidence for hydronephrosis. 3. Status post hysterectomy. 4.  Aortic Atherosclerosis (ICD10-I70.0). Electronically Signed   By: Constance Holster M.D.   On: 12/27/2019 20:52   CT IMAGE GUIDED DRAINAGE BY PERCUTANEOUS CATHETER  Result Date: 12/28/2019 INDICATION: History of perforated appendicitis, now with pelvic abscess. Please perform CT-guided percutaneous drainage catheter placement for infection source control purposes. EXAM: CT IMAGE GUIDED DRAINAGE BY PERCUTANEOUS CATHETER COMPARISON:  CT abdomen and pelvis-01/16/2020 MEDICATIONS: The patient is currently admitted to the hospital and receiving intravenous antibiotics. The antibiotics were administered within an appropriate time frame prior to the initiation of the procedure. ANESTHESIA/SEDATION: Moderate (conscious) sedation was employed during this procedure. A total of Versed 3 mg and Fentanyl 150 mcg was administered intravenously. Moderate Sedation Time: 14 minutes. The patient's level of  consciousness and vital signs were monitored continuously by radiology nursing throughout the procedure under my direct supervision. CONTRAST:  None COMPLICATIONS: None immediate. PROCEDURE: Informed written consent was obtained from the patient after a discussion of the risks, benefits and alternatives to treatment. The patient was placed prone on the CT gantry and a pre procedural CT was performed re-demonstrating the known abscess/fluid collection within the lower pelvis with dominant component measuring approximately 5.8 x 2.6 cm (image 22, series 3). The procedure was planned. A timeout was performed prior to the initiation of the procedure. The right buttocks was prepped and draped in the usual sterile fashion. The overlying soft tissues were anesthetized with 1% lidocaine with epinephrine. Appropriate trajectory was planned with the use of a 22 gauge spinal needle. An 18 gauge trocar needle was advanced into the abscess/fluid collection and a short Amplatz super stiff wire was coiled within the collection. Appropriate positioning was confirmed with a limited CT scan. The tract was serially dilated allowing placement of a 12 [place] all-purpose drainage catheter. Appropriate positioning was confirmed with a limited postprocedural CT scan. Approximately 15 ml of purulent fluid was aspirated. The tube was connected to a drainage bag and sutured in place. A dressing was placed. The patient tolerated the procedure well without immediate post procedural complication. IMPRESSION: Successful CT guided placement of a 17 French all purpose drain catheter into the lower pelvis via right trans gluteal approach with aspiration of 15 mL of purulent fluid. Samples were sent to the laboratory as requested by the ordering clinical team. Electronically Signed   By: Sandi Mariscal M.D.   On: 12/28/2019 11:46  Labs:  CBC: Recent Labs    12/27/19 1455 12/28/19 0558 12/29/19 0624  WBC 10.5 9.8 9.3  HGB 11.3* 10.7* 10.1*    HCT 37.0 35.0* 32.6*  PLT 399 350 338    COAGS: Recent Labs    12/28/19 1722  INR 1.1    BMP: Recent Labs    12/27/19 1455 12/28/19 0558 12/29/19 0624  NA 137 140 138  K 4.4 4.0 4.2  CL 102 105 106  CO2 26 26 25   GLUCOSE 95 97 102*  BUN 10 7* 8  CALCIUM 9.4 9.1 8.7*  CREATININE 0.98 0.98 1.15*  GFRNONAA 59* 59* 49*  GFRAA >60 >60 56*    LIVER FUNCTION TESTS: Recent Labs    12/27/19 1455  BILITOT 0.5  AST 14*  ALT 13  ALKPHOS 89  PROT 7.5  ALBUMIN 3.1*    Assessment and Plan:  Rt TG drain intact Will follow Need re CT when OP under 10 cc daily Flush every shift  Electronically Signed: Lavonia Drafts, PA-C 12/29/2019, 12:29 PM   I spent a total of 15 Minutes at the the patient's bedside AND on the patient's hospital floor or unit, greater than 50% of which was counseling/coordinating care for Rt TG drain    Patient ID: Jean Shaffer, female   DOB: [birth year], 69 y.o.   MRN: [id number]

## 2019-12-30 LAB — BASIC METABOLIC PANEL
Anion gap: 9 (ref 5–15)
BUN: <5 mg/dL — ABNORMAL LOW (ref 8–23)
CO2: 24 mmol/L (ref 22–32)
Calcium: 8.8 mg/dL — ABNORMAL LOW (ref 8.9–10.3)
Chloride: 110 mmol/L (ref 98–111)
Creatinine, Ser: 0.91 mg/dL (ref 0.44–1.00)
GFR calc Af Amer: >60 mL/min (ref >60)
GFR calc non Af Amer: >60 mL/min (ref >60)
Glucose, Bld: 79 mg/dL (ref 70–99)
Potassium: 3.7 mmol/L (ref 3.5–5.1)
Sodium: 143 mmol/L (ref 135–145)

## 2019-12-30 LAB — URINE CULTURE: Culture: NO GROWTH

## 2019-12-30 LAB — CBC
HCT: 32.2 % — ABNORMAL LOW (ref 36.0–46.0)
Hemoglobin: 10 g/dL — ABNORMAL LOW (ref 12.0–15.0)
MCH: 27.9 pg (ref 26.0–34.0)
MCHC: 31.1 g/dL (ref 30.0–36.0)
MCV: 89.7 fL (ref 80.0–100.0)
Platelets: 272 10*3/uL (ref 150–400)
RBC: 3.59 MIL/uL — ABNORMAL LOW (ref 3.87–5.11)
RDW: 13.2 % (ref 11.5–15.5)
WBC: 7.7 10*3/uL (ref 4.0–10.5)
nRBC: 0 % (ref 0.0–0.2)

## 2019-12-30 MED ORDER — DOCUSATE SODIUM 100 MG PO CAPS: 100.0000 mg | ORAL_CAPSULE | Freq: Two times a day (BID) | ORAL | 0 refills | Status: DC

## 2019-12-30 MED ORDER — SENNOSIDES-DOCUSATE SODIUM 8.6-50 MG PO TABS: 1.0000 | ORAL_TABLET | Freq: Every day | ORAL | Status: DC

## 2019-12-30 MED ORDER — AMOXICILLIN-POT CLAVULANATE 875-125 MG PO TABS: 1.0000 | ORAL_TABLET | Freq: Two times a day (BID) | ORAL | 0 refills | Status: AC

## 2019-12-30 MED ORDER — ACETAMINOPHEN 325 MG PO TABS: 650.0000 mg | ORAL_TABLET | Freq: Four times a day (QID) | ORAL | Status: DC | PRN

## 2019-12-30 MED ORDER — SODIUM CHLORIDE 0.9% FLUSH: 5.0000 mL | Freq: Every day | INTRAVENOUS | 0 refills | Status: AC

## 2019-12-30 MED ORDER — POLYETHYLENE GLYCOL 3350 17 G PO PACK
17.0000 g | PACK | Freq: Every day | ORAL | Status: DC
  Administered 2019-12-30: 17 g via ORAL
  Filled 2019-12-30: qty 1

## 2019-12-30 MED ORDER — POLYETHYLENE GLYCOL 3350 17 G PO PACK: 17.0000 g | PACK | Freq: Every day | ORAL | Status: DC | PRN

## 2019-12-30 MED ORDER — OXYCODONE HCL 5 MG PO TABS: 5.0000 mg | ORAL_TABLET | Freq: Four times a day (QID) | ORAL | 0 refills | Status: DC | PRN

## 2019-12-30 MED ORDER — OXYCODONE HCL 5 MG PO TABS: 5.0000 mg | ORAL_TABLET | ORAL | Status: DC | PRN

## 2019-12-30 MED ORDER — DOCUSATE SODIUM 100 MG PO CAPS
100.0000 mg | ORAL_CAPSULE | Freq: Two times a day (BID) | ORAL | Status: DC
  Administered 2019-12-30: 100 mg via ORAL
  Filled 2019-12-30: qty 1

## 2019-12-30 MED ORDER — SENNOSIDES-DOCUSATE SODIUM 8.6-50 MG PO TABS
1.0000 | ORAL_TABLET | Freq: Every day | ORAL | Status: DC
  Administered 2019-12-30: 1 via ORAL
  Filled 2019-12-30: qty 1

## 2019-12-30 MED ORDER — MORPHINE SULFATE (PF) 2 MG/ML IV SOLN: 2.0000 mg | INTRAVENOUS | Status: DC | PRN

## 2019-12-30 MED ORDER — POLYETHYLENE GLYCOL 3350 17 G PO PACK: 17.0000 g | PACK | Freq: Every day | ORAL | 0 refills | Status: DC

## 2019-12-30 NOTE — Progress Notes (Addendum)
Referring Physician(s): Dr Deland Pretty  Supervising Physician: Corrie Mckusick  Patient Status:  [facility] - In-pt  Chief Complaint:  Perforated appendicitiswith abscess - s/p IR drain 1/3  Subjective:  Resting Feeling less painful daily Woke in a cold sweat last night--- better today   Allergies: Patient has no known allergies.  Medications: Prior to Admission medications   Medication Sig Start Date End Date Taking? Authorizing Provider  albuterol (PROVENTIL HFA;VENTOLIN HFA) 108 (90 Base) MCG/ACT inhaler Inhale 2 puffs into the lungs every 4 (four) hours as needed for shortness of breath or wheezing. 07/30/15  Yes [provider]  amLODipine (NORVASC) 2.5 MG tablet Take 2.5 mg by mouth daily.   Yes [provider]  aspirin 81 MG tablet Take 81 mg by mouth every morning.   Yes [provider]  buPROPion (WELLBUTRIN XL) 150 MG 24 hr tablet Take 150 mg by mouth every morning. 09/11/18  Yes [provider]  cholecalciferol (VITAMIN D) 25 MCG (1000 UT) tablet Take 1,000 Units by mouth daily.   Yes [provider]  diclofenac sodium (VOLTAREN) 1 % GEL Apply 2 g topically 4 (four) times daily as needed (knee pain).  07/30/15  Yes [provider]  HYDROcodone-acetaminophen (NORCO/VICODIN) 5-325 MG per tablet Take 1-2 tablets by mouth every 6 hours as needed for pain. 02/10/15  Yes Pisciotta, Elmyra Ricks, PA-C  ibuprofen (ADVIL,MOTRIN) 200 MG tablet Take 200 mg by mouth every 6 (six) hours as needed for pain.   Yes [provider]  levothyroxine (SYNTHROID, LEVOTHROID) 50 MCG tablet Take 50 mcg by mouth every morning. 09/06/18  Yes [provider]  tiotropium (SPIRIVA HANDIHALER) 18 MCG inhalation capsule Take 1 puff by mouth daily. 10/08/18  Yes [provider]  traMADol (ULTRAM) 50 MG tablet Take 50 mg by mouth every 6 (six) hours as needed for pain.   Yes [provider]  traZODone (DESYREL) 100 MG tablet Take  100 mg by mouth at bedtime. 03/01/18  Yes [provider]  valACYclovir (VALTREX) 500 MG tablet Take 500 mg by mouth See admin instructions. Take 1 tablet by mouth twice daily for 3 days during outbreak 06/10/18  Yes [provider]     Vital Signs: BP (!) 150/58 (BP Location: Left Arm)    Pulse 62    Temp 98.3 F (36.8 C) (Oral)    Resp 16    Ht 5\' 4"  (1.626 m)    SpO2 94%    BMI 24.89 kg/m   Physical Exam Skin:    General: Skin is warm and dry.     Comments: Site is clean and dry Sl tender OP purulent      Imaging: CT ABDOMEN PELVIS W CONTRAST  Result Date: 12/27/2019 CLINICAL DATA:  Pelvic abscess.  Nausea and vomiting.  UTI. EXAM: CT ABDOMEN AND PELVIS WITH CONTRAST TECHNIQUE: Multidetector CT imaging of the abdomen and pelvis was performed using the standard protocol following bolus administration of intravenous contrast. CONTRAST:  145mL OMNIPAQUE IOHEXOL 300 MG/ML  SOLN COMPARISON:  None. FINDINGS: Lower chest: The lung bases are clear. The heart size is normal. Hepatobiliary: The liver is normal. Normal gallbladder.There is no biliary ductal dilation. Pancreas: Normal contours without ductal dilatation. No peripancreatic fluid collection. Spleen: No splenic laceration or hematoma. Adrenals/Urinary Tract: --Adrenal glands: No adrenal hemorrhage. --Right kidney/ureter: No hydronephrosis or perinephric hematoma. --Left kidney/ureter: No hydronephrosis or perinephric hematoma. --Urinary bladder: Unremarkable. Stomach/Bowel: --Stomach/Duodenum: No hiatal hernia or other gastric abnormality. Normal duodenal  course and caliber. --Small bowel: No dilatation or inflammation. --Colon: No focal abnormality. --Appendix: The appendix is diffusely enlarged measuring up to approximately 9 mm proximally. There are likely appendicular lists throughout the appendix. The distal appendix appears to be ruptured with a developing abscess measuring approximately 2.7 x 4.9 cm located within the  pelvis. The appendix is primarily located within the patient's pelvis. Vascular/Lymphatic: Atherosclerotic calcification is present within the non-aneurysmal abdominal aorta, without hemodynamically significant stenosis. --No retroperitoneal lymphadenopathy. --No mesenteric lymphadenopathy. --No pelvic or inguinal lymphadenopathy. Reproductive: Status post hysterectomy. No adnexal mass. Other: No ascites or free air. The abdominal wall is normal. Musculoskeletal. No acute displaced fractures. Patient is status post posterior fusion from L4 through S1. Interbody spaces are noted at the L4-L5 and L5-S1 levels. IMPRESSION: 1. Overall findings concerning for perforated appendicitis with a developing 4.9 x 2.6 cm abscess in the patient's pelvis. 2. No evidence for hydronephrosis. 3. Status post hysterectomy. 4.  Aortic Atherosclerosis (ICD10-I70.0). Electronically Signed   By: Constance Holster M.D.   On: 12/27/2019 20:52   CT IMAGE GUIDED DRAINAGE BY PERCUTANEOUS CATHETER  Result Date: 12/28/2019 INDICATION: History of perforated appendicitis, now with pelvic abscess. Please perform CT-guided percutaneous drainage catheter placement for infection source control purposes. EXAM: CT IMAGE GUIDED DRAINAGE BY PERCUTANEOUS CATHETER COMPARISON:  CT abdomen and pelvis-01/16/2020 MEDICATIONS: The patient is currently admitted to the hospital and receiving intravenous antibiotics. The antibiotics were administered within an appropriate time frame prior to the initiation of the procedure. ANESTHESIA/SEDATION: Moderate (conscious) sedation was employed during this procedure. A total of Versed 3 mg and Fentanyl 150 mcg was administered intravenously. Moderate Sedation Time: 14 minutes. The patient's level of consciousness and vital signs were monitored continuously by radiology nursing throughout the procedure under my direct supervision. CONTRAST:  None COMPLICATIONS: None immediate. PROCEDURE: Informed written consent was  obtained from the patient after a discussion of the risks, benefits and alternatives to treatment. The patient was placed prone on the CT gantry and a pre procedural CT was performed re-demonstrating the known abscess/fluid collection within the lower pelvis with dominant component measuring approximately 5.8 x 2.6 cm (image 22, series 3). The procedure was planned. A timeout was performed prior to the initiation of the procedure. The right buttocks was prepped and draped in the usual sterile fashion. The overlying soft tissues were anesthetized with 1% lidocaine with epinephrine. Appropriate trajectory was planned with the use of a 22 gauge spinal needle. An 18 gauge trocar needle was advanced into the abscess/fluid collection and a short Amplatz super stiff wire was coiled within the collection. Appropriate positioning was confirmed with a limited CT scan. The tract was serially dilated allowing placement of a 12 [place] all-purpose drainage catheter. Appropriate positioning was confirmed with a limited postprocedural CT scan. Approximately 15 ml of purulent fluid was aspirated. The tube was connected to a drainage bag and sutured in place. A dressing was placed. The patient tolerated the procedure well without immediate post procedural complication. IMPRESSION: Successful CT guided placement of a 27 French all purpose drain catheter into the lower pelvis via right trans gluteal approach with aspiration of 15 mL of purulent fluid. Samples were sent to the laboratory as requested by the ordering clinical team. Electronically Signed   By: Sandi Mariscal M.D.   On: 12/28/2019 11:46    Labs:  CBC: Recent Labs    12/27/19 1455 12/28/19 0558 12/29/19 0624 12/30/19 0402  WBC 10.5 9.8 9.3 7.7  HGB 11.3* 10.7* 10.1*  10.0*  HCT 37.0 35.0* 32.6* 32.2*  PLT 399 350 338 272    COAGS: Recent Labs    12/28/19 1722  INR 1.1    BMP: Recent Labs    12/27/19 1455 12/28/19 0558 12/29/19 0624 12/30/19 0402    NA 137 140 138 143  K 4.4 4.0 4.2 3.7  CL 102 105 106 110  CO2 26 26 25 24   GLUCOSE 95 97 102* 79  BUN 10 7* 8 <5*  CALCIUM 9.4 9.1 8.7* 8.8*  CREATININE 0.98 0.98 1.15* 0.91  GFRNONAA 59* 59* 49* >60  GFRAA >60 >60 56* >60    LIVER FUNCTION TESTS: Recent Labs    12/27/19 1455  BILITOT 0.5  AST 14*  ALT 13  ALKPHOS 89  PROT 7.5  ALBUMIN 3.1*    Assessment and Plan:  OP purulent 25 cc yesterday 10 cc in JP Re CT when OP less than 10 cc daily If home with drain-- will need OP IR follow up Needs flushed daily with 5-10 cc sterile saline Rx for flushes Pt will hear from IR for time and date of OP follow up appt  Electronically Signed: Lavonia Drafts, PA-C 12/30/2019, 9:51 AM   I spent a total of 15 Minutes at the the patient's bedside AND on the patient's hospital floor or unit, greater than 50% of which was counseling/coordinating care for TG drain

## 2019-12-30 NOTE — Discharge Instructions (Signed)
Percutaneous Abscess Drain, Care After This sheet gives you information about how to care for yourself after your procedure. Your health care provider may also give you more specific instructions. If you have problems or questions, contact your health care provider. What can I expect after the procedure? After your procedure, it is common to have:  A small amount of bruising and discomfort in the area where the drainage tube (catheter) was placed.  Sleepiness and fatigue. This should go away after the medicines you were given have worn off. Follow these instructions at home: Incision care  Follow instructions from your health care provider about how to take care of your incision. Make sure you: ? Wash your hands with soap and water before you change your bandage (dressing). If soap and water are not available, use hand sanitizer. ? Change your dressing as told by your health care provider. ? Leave stitches (sutures), skin glue, or adhesive strips in place. These skin closures may need to stay in place for 2 weeks or longer. If adhesive strip edges start to loosen and curl up, you may trim the loose edges. Do not remove adhesive strips completely unless your health care provider tells you to do that.  Check your incision area every day for signs of infection. Check for: ? More redness, swelling, or pain. ? More fluid or blood. ? Warmth. ? Pus or a bad smell. ? Fluid leaking from around your catheter (instead of fluid draining through your catheter). Catheter care   Follow instructions from your health care provider about emptying and cleaning your catheter and collection bag. You may need to clean the catheter every day so it does not clog.  If directed, write down the following information every time you empty your bag: ? The date and time. ? The amount of drainage. General instructions  Rest at home for 1-2 days after your procedure. Return to your normal activities as told by your  health care provider.  Do not take baths, swim, or use a hot tub for 24 hours after your procedure, or until your health care provider says that this is okay.  Take over-the-counter and prescription medicines only as told by your health care provider.  Keep all follow-up visits as told by your health care provider. This is important. Contact a health care provider if:  You have less than 10 mL of drainage a day for 2-3 days in a row, or as directed by your health care provider.  You have more redness, swelling, or pain around your incision area.  You have more fluid or blood coming from your incision area.  Your incision area feels warm to the touch.  You have pus or a bad smell coming from your incision area.  You have fluid leaking from around your catheter (instead of through your catheter).  You have a fever or chills.  You have pain that does not get better with medicine. Get help right away if:  Your catheter comes out.  You suddenly stop having drainage from your catheter.  You suddenly have blood in the fluid that is draining from your catheter.  You become dizzy or you faint.  You develop a rash.  You have nausea or vomiting.  You have difficulty breathing or you feel short of breath.  You develop chest pain.  You have problems with your speech or vision.  You have trouble balancing or moving your arms or legs. Summary  It is common to have a small   amount of bruising and discomfort in the area where the drainage tube (catheter) was placed.  You may be directed to record the amount of drainage from the bag every time you empty it.  Follow instructions from your health care provider about emptying and cleaning your catheter and collection bag. This information is not intended to replace advice given to you by your health care provider. Make sure you discuss any questions you have with your health care provider. Document Revised: 11/23/2017 Document  Reviewed: 11/02/2016 [employer] Patient Education  2020 [employer]  

## 2019-12-30 NOTE — Discharge Summary (Signed)
[facility] Discharge Summary   Patient ID: Jean Shaffer MRN: [id number] DOB/AGE: 09/08/1951 69 y.o.  Admit date: 12/27/2019 Discharge date: 12/30/2019  Admitting Diagnosis: Perforated appendicitis  Discharge Diagnosis Patient Active Problem List   Diagnosis Date Noted  . Perforated appendicitis 12/27/2019  . Palpitations 02/05/2019  . Syncope 12/07/2018  . HTN (hypertension) 12/07/2018  . COPD (chronic obstructive pulmonary disease) ([place]) 12/07/2018    Consultants Interventional radiology  Imaging: No results found.  Procedures Dr. Pascal Lux (12/27/18) - CT guided placed of a 10 Fr drainage catheter placement into the pelvic abscess via right transgluteal approach   Hospital Course:  Jean Shaffer is a 69yo female who presented to [facility] 1/2 with pelvic pain. A couple weeks ago she had n/v/d abdominal pain and was evaluated for viral syndrome, neg covid. This got somewhat better and then 1/2 had greenish drainage from her vagina. She was seen in urgent care and had speculum exam with drainage noted from vagina worse with palpation in her rlq. She has not had any more fevers.  Is having bms. States normal colonoscopy about four years ago.  She underwent ct scan and it appears she has perforated appendicitis with pelvic abscess.  Patient was admitted to the surgical service. IR consulted and placed a percutaneous drain 1/3 for pelvic abscess. Tolerated procedure well. Diet was advanced as tolerated.  On 12/30/2019 the patient was voiding well, tolerating diet, ambulating well, pain well controlled, vital signs stable and felt stable for discharge home.  She was discharged with the drain and 7 days of augmentin. Patient will follow up as below and knows to call with questions or concerns.   I have personally reviewed the patients medication history on the [place] controlled substance database.    Allergies as of 12/30/2019   No Known Allergies     Medication List    STOP  taking these medications   HYDROcodone-acetaminophen 5-325 MG tablet Commonly known as: NORCO/VICODIN   traMADol 50 MG tablet Commonly known as: ULTRAM     TAKE these medications   acetaminophen 325 MG tablet Commonly known as: TYLENOL Take 2 tablets (650 mg total) by mouth every 6 (six) hours as needed for mild pain (or temp > 100).   albuterol 108 (90 Base) MCG/ACT inhaler Commonly known as: VENTOLIN HFA Inhale 2 puffs into the lungs every 4 (four) hours as needed for shortness of breath or wheezing.   amLODipine 2.5 MG tablet Commonly known as: NORVASC Take 2.5 mg by mouth daily.   amoxicillin-clavulanate 875-125 MG tablet Commonly known as: Augmentin Take 1 tablet by mouth 2 (two) times daily for 7 days.   aspirin 81 MG tablet Take 81 mg by mouth every morning.   buPROPion 150 MG 24 hr tablet Commonly known as: WELLBUTRIN XL Take 150 mg by mouth every morning.   cholecalciferol 25 MCG (1000 UT) tablet Commonly known as: VITAMIN D Take 1,000 Units by mouth daily.   diclofenac sodium 1 % Gel Commonly known as: VOLTAREN Apply 2 g topically 4 (four) times daily as needed (knee pain).   docusate sodium 100 MG capsule Commonly known as: COLACE Take 1 capsule (100 mg total) by mouth 2 (two) times daily.   ibuprofen 200 MG tablet Commonly known as: ADVIL Take 200 mg by mouth every 6 (six) hours as needed for pain.   levothyroxine 50 MCG tablet Commonly known as: SYNTHROID Take 50 mcg by mouth every morning.   oxyCODONE 5 MG immediate release tablet  Commonly known as: Oxy IR/ROXICODONE Take 1 tablet (5 mg total) by mouth every 6 (six) hours as needed for severe pain.   polyethylene glycol 17 g packet Commonly known as: MIRALAX / GLYCOLAX Take 17 g by mouth daily.   senna-docusate 8.6-50 MG tablet Commonly known as: Senokot-S Take 1 tablet by mouth daily. Start taking on: December 31, 2019   sodium chloride flush 0.9 % Soln Commonly known as: NS 5 mLs by  Intracatheter route daily for 21 days. Flush drain once daily with 5-10 cc sterile saline   Spiriva HandiHaler 18 MCG inhalation capsule Generic drug: tiotropium Take 1 puff by mouth daily.   traZODone 100 MG tablet Commonly known as: DESYREL Take 100 mg by mouth at bedtime.   valACYclovir 500 MG tablet Commonly known as: VALTREX Take 500 mg by mouth See admin instructions. Take 1 tablet by mouth twice daily for 3 days during outbreak        Follow-up Information    Rolm Bookbinder, MD. Go on 01/13/2020.   Specialty: General Surgery Why: Your appointment is 01/13/2020 @ 4:30pm Please arrive 30 minutes prior to your appointment to check in and fill out paperwork. Bring photo ID and insurance information. Contact information: [address] [place] [address] [phone]        Sandi Mariscal, MD Follow up in 10 day(s).   Specialties: Interventional Radiology, Radiology Why: pt will hear from [facility] for follow up of drain-- call [phone] if any questions--- will need flush daily and record output Contact information: [place] [address] [phone]           Signed: Wellington Hampshire, [facility] 12/30/2019, 3:14 PM Please see Amion for pager number during day hours 7:00am-4:30pm

## 2019-12-30 NOTE — Progress Notes (Signed)
[facility] Progress Note     Subjective: CC-  WBC 7.7, afebrile Still complaining of some lower abdominal pain, but states that it is less than yesterday. Tolerating full liquids. Denies n/v. Passing some flatus, no BM. Ambulated multiple times yesterday without issues. States that she takes colace and senokot often at home when she cannot have a BM.  Objective: Vital signs in last 24 hours: Temp:  [98.3 F (36.8 C)-98.9 F (37.2 C)] 98.3 F (36.8 C) (01/05 0421) Pulse Rate:  [62-72] 62 (01/05 0421) Resp:  [16-18] 16 (01/05 0421) BP: (125-151)/(55-59) 150/58 (01/05 0421) SpO2:  [94 %-98 %] 94 % (01/05 0421) Last BM Date: 12/25/19  Intake/Output from previous day: 01/04 0701 - 01/05 0700 In: 2630.5 [P.O.:600; I.V.:1890.5; IV Piggyback:135.1] Out: 25 [Drains:25] Intake/Output this shift: No intake/output data recorded.  PE: Gen:  Alert, NAD, pleasant HEENT: EOM's intact, pupils equal and round Pulm:  Rate and effort normal Abd: Soft, ND, +BS, no HSM, no hernia, mild central abdominal TTP without rebound or guarding, drain with scant bloody appearing fluid in bag Psych: A&Ox3  Skin: no rashes noted, warm and dry   Lab Results:  Recent Labs    12/29/19 0624 12/30/19 0402  WBC 9.3 7.7  HGB 10.1* 10.0*  HCT 32.6* 32.2*  PLT 338 272   BMET Recent Labs    12/29/19 0624 12/30/19 0402  NA 138 143  K 4.2 3.7  CL 106 110  CO2 25 24  GLUCOSE 102* 79  BUN 8 <5*  CREATININE 1.15* 0.91  CALCIUM 8.7* 8.8*   PT/INR Recent Labs    12/28/19 1722  LABPROT 13.7  INR 1.1   CMP     Component Value Date/Time   NA 143 12/30/2019 0402   K 3.7 12/30/2019 0402   CL 110 12/30/2019 0402   CO2 24 12/30/2019 0402   GLUCOSE 79 12/30/2019 0402   BUN <5 (L) 12/30/2019 0402   CREATININE 0.91 12/30/2019 0402   CALCIUM 8.8 (L) 12/30/2019 0402   PROT 7.5 12/27/2019 1455   ALBUMIN 3.1 (L) 12/27/2019 1455   AST 14 (L) 12/27/2019 1455   ALT 13 12/27/2019 1455    ALKPHOS 89 12/27/2019 1455   BILITOT 0.5 12/27/2019 1455   GFRNONAA >60 12/30/2019 0402   GFRAA >60 12/30/2019 0402   Lipase  No results found for: LIPASE     Studies/Results: CT IMAGE GUIDED DRAINAGE BY PERCUTANEOUS CATHETER  Result Date: 12/28/2019 INDICATION: History of perforated appendicitis, now with pelvic abscess. Please perform CT-guided percutaneous drainage catheter placement for infection source control purposes. EXAM: CT IMAGE GUIDED DRAINAGE BY PERCUTANEOUS CATHETER COMPARISON:  CT abdomen and pelvis-01/16/2020 MEDICATIONS: The patient is currently admitted to the hospital and receiving intravenous antibiotics. The antibiotics were administered within an appropriate time frame prior to the initiation of the procedure. ANESTHESIA/SEDATION: Moderate (conscious) sedation was employed during this procedure. A total of Versed 3 mg and Fentanyl 150 mcg was administered intravenously. Moderate Sedation Time: 14 minutes. The patient's level of consciousness and vital signs were monitored continuously by radiology nursing throughout the procedure under my direct supervision. CONTRAST:  None COMPLICATIONS: None immediate. PROCEDURE: Informed written consent was obtained from the patient after a discussion of the risks, benefits and alternatives to treatment. The patient was placed prone on the CT gantry and a pre procedural CT was performed re-demonstrating the known abscess/fluid collection within the lower pelvis with dominant component measuring approximately 5.8 x 2.6 cm (image 22, series 3). The procedure  was planned. A timeout was performed prior to the initiation of the procedure. The right buttocks was prepped and draped in the usual sterile fashion. The overlying soft tissues were anesthetized with 1% lidocaine with epinephrine. Appropriate trajectory was planned with the use of a 22 gauge spinal needle. An 18 gauge trocar needle was advanced into the abscess/fluid collection and a short  Amplatz super stiff wire was coiled within the collection. Appropriate positioning was confirmed with a limited CT scan. The tract was serially dilated allowing placement of a 12 [place] all-purpose drainage catheter. Appropriate positioning was confirmed with a limited postprocedural CT scan. Approximately 15 ml of purulent fluid was aspirated. The tube was connected to a drainage bag and sutured in place. A dressing was placed. The patient tolerated the procedure well without immediate post procedural complication. IMPRESSION: Successful CT guided placement of a 42 French all purpose drain catheter into the lower pelvis via right trans gluteal approach with aspiration of 15 mL of purulent fluid. Samples were sent to the laboratory as requested by the ordering clinical team. Electronically Signed   By: Sandi Mariscal M.D.   On: 12/28/2019 11:46    Anti-infectives: Anti-infectives (From admission, onward)   Start     Dose/Rate Route Frequency Ordered Stop   12/28/19 0600  piperacillin-tazobactam (ZOSYN) IVPB 3.375 g     3.375 g 12.5 mL/hr over 240 Minutes Intravenous Every 8 hours 12/27/19 2328     12/27/19 2115  piperacillin-tazobactam (ZOSYN) IVPB 3.375 g     3.375 g 100 mL/hr over 30 Minutes Intravenous  Once 12/27/19 2109 12/27/19 2213   12/27/19 2115  metroNIDAZOLE (FLAGYL) IVPB 500 mg     500 mg 100 mL/hr over 60 Minutes Intravenous  Once 12/27/19 2109 12/27/19 2304       Assessment/Plan HTN - home meds hypothyroidism - home meds  COPD - home meds  Perforated appendicitiswith abscess - s/p IR drain 1/3, GS with gram positive cocci and negative rods, culture pending  FEN -decrease IVFs, FLD VTE -SCDs, lovenox ID -zosyn 1/2 -->day#4 Follow up - Dr. Donne Hazel  Plan: Continue drain and IV antibiotics, follow culture. Add colace and senokot. Advancing diet as tolerated. Continue mobilizing.    LOS: 3 days    [facility] 12/30/2019, 7:56  AM Please see Amion for pager number during day hours 7:00am-4:30pm

## 2019-12-31 ENCOUNTER — Other Ambulatory Visit: Payer: Self-pay | Admitting: General Surgery

## 2019-12-31 DIAGNOSIS — N739 Female pelvic inflammatory disease, unspecified: Secondary | ICD-10-CM

## 2020-01-01 LAB — AEROBIC/ANAEROBIC CULTURE W GRAM STAIN (SURGICAL/DEEP WOUND): Special Requests: NORMAL

## 2020-01-05 MED FILL — NORMAL SALINE FLUSH SYRINGE: 0.9 | 10 days supply | Qty: 100 | Fill #0

## 2020-01-08 ENCOUNTER — Ambulatory Visit
Admission: RE | Admit: 2020-01-08 | Discharge: 2020-01-08 | Disposition: A | Discharge status: Home / Self Care | Payer: Medicare Other | Source: Ambulatory Visit | Attending: General Surgery | Admitting: General Surgery

## 2020-01-08 ENCOUNTER — Other Ambulatory Visit: Payer: Self-pay | Admitting: General Surgery

## 2020-01-08 ENCOUNTER — Ambulatory Visit
Admission: RE | Admit: 2020-01-08 | Discharge: 2020-01-08 | Disposition: A | Discharge status: Home / Self Care | Payer: Medicare Other | Source: Ambulatory Visit | Attending: Radiology | Admitting: Radiology

## 2020-01-08 ENCOUNTER — Encounter: Payer: Self-pay | Admitting: Radiology

## 2020-01-08 DIAGNOSIS — N739 Female pelvic inflammatory disease, unspecified: Secondary | ICD-10-CM

## 2020-01-08 HISTORY — PX: IR RADIOLOGIST EVAL & MGMT: IMG5224

## 2020-01-08 MED ORDER — IOPAMIDOL (ISOVUE-300) INJECTION 61%
100.0000 mL | Freq: Once | INTRAVENOUS | Status: AC | PRN
  Administered 2020-01-08: 100 mL via INTRAVENOUS

## 2020-01-08 NOTE — Progress Notes (Signed)
Referring Physician(s): Dr Jeanmarie Hubert  Chief Complaint: The patient is seen in follow up today s/p 12/28/19 Successful CT guided placement of a 12 French all purpose drain catheter into the lower pelvis via right trans gluteal approach  History of present illness:  Perforated appendix abscess drain placed 12/28/19  Here today for CT and injection to evaluate abscess and possible fistula  States she is doing well No pain No fever or chills  OP cloudy brown and 10-20 cc daily Flushes 10 cc x 2 daily  Finished antibiotics yesterday  CT showing collections small to none Injection following CT planned  Past Medical History:  Diagnosis Date  . Chronic back pain   . COPD (chronic obstructive pulmonary disease) ([place])   . Hypertension   . Hypothyroidism   . Thyroid disease     Past Surgical History:  Procedure Laterality Date  . ABDOMINAL HYSTERECTOMY    . COLONOSCOPY    . IR RADIOLOGIST EVAL & MGMT  01/08/2020  . NECK SURGERY    . SPINE SURGERY      Allergies: Patient has no known allergies.  Medications: Prior to Admission medications   Medication Sig Start Date End Date Taking? Authorizing Provider  acetaminophen (TYLENOL) 325 MG tablet Take 2 tablets (650 mg total) by mouth every 6 (six) hours as needed for mild pain (or temp > 100). 12/30/19   Meuth, Brooke A, PA-C  albuterol (PROVENTIL HFA;VENTOLIN HFA) 108 (90 Base) MCG/ACT inhaler Inhale 2 puffs into the lungs every 4 (four) hours as needed for shortness of breath or wheezing. 07/30/15   [provider]  amLODipine (NORVASC) 2.5 MG tablet Take 2.5 mg by mouth daily.    [provider]  aspirin 81 MG tablet Take 81 mg by mouth every morning.    [provider]  buPROPion (WELLBUTRIN XL) 150 MG 24 hr tablet Take 150 mg by mouth every morning. 09/11/18   [provider]  cholecalciferol (VITAMIN D) 25 MCG (1000 UT) tablet Take 1,000 Units by mouth daily.    [provider]    diclofenac sodium (VOLTAREN) 1 % GEL Apply 2 g topically 4 (four) times daily as needed (knee pain).  07/30/15   [provider]  docusate sodium (COLACE) 100 MG capsule Take 1 capsule (100 mg total) by mouth 2 (two) times daily. 12/30/19   Meuth, Brooke A, PA-C  ibuprofen (ADVIL,MOTRIN) 200 MG tablet Take 200 mg by mouth every 6 (six) hours as needed for pain.    [provider]  levothyroxine (SYNTHROID, LEVOTHROID) 50 MCG tablet Take 50 mcg by mouth every morning. 09/06/18   [provider]  oxyCODONE (OXY IR/ROXICODONE) 5 MG immediate release tablet Take 1 tablet (5 mg total) by mouth every 6 (six) hours as needed for severe pain. 12/30/19   Meuth, Brooke A, PA-C  polyethylene glycol (MIRALAX / GLYCOLAX) 17 g packet Take 17 g by mouth daily. 12/30/19   Meuth, Brooke A, PA-C  senna-docusate (SENOKOT-S) 8.6-50 MG tablet Take 1 tablet by mouth daily. 12/31/19   Meuth, Brooke A, PA-C  sodium chloride flush (NS) 0.9 % SOLN 5 mLs by Intracatheter route daily for 21 days. Flush drain once daily with 5-10 cc sterile saline 12/30/19 01/20/20  Meuth, Brooke A, PA-C  tiotropium (SPIRIVA HANDIHALER) 18 MCG inhalation capsule Take 1 puff by mouth daily. 10/08/18   [provider]  traZODone (DESYREL) 100 MG tablet Take 100 mg by mouth at bedtime. 03/01/18   [provider]  valACYclovir (VALTREX) 500 MG tablet Take 500 mg by mouth See admin instructions. Take 1 tablet by mouth twice daily for 3 days during outbreak 06/10/18   [provider]     Family History  Problem Relation Age of Onset  . Alcohol abuse Father   . Cancer Father   . Heart disease Father   . Cancer Mother   . Cancer Maternal Grandfather   . Heart disease Maternal Grandfather   . Heart disease Paternal Grandmother   . Diabetes Paternal Grandmother   . Diabetes Paternal Grandfather     Social History   Socioeconomic History  . Marital status: Single    Spouse name: Not on file  . Number of  children: Not on file  . Years of education: Not on file  . Highest education level: Not on file  Occupational History  . Not on file  Tobacco Use  . Smoking status: Former Research scientist (life sciences)  . Smokeless tobacco: Never Used  Substance and Sexual Activity  . Alcohol use: No  . Drug use: No  . Sexual activity: Not on file  Other Topics Concern  . Not on file  Social History Narrative  . Not on file   Social Determinants of Health   Financial Resource Strain:   . Difficulty of Paying Living Expenses: Not on file  Food Insecurity:   . Worried About Charity fundraiser in the Last Year: Not on file  . Ran Out of Food in the Last Year: Not on file  Transportation Needs:   . Lack of Transportation (Medical): Not on file  . Lack of Transportation (Non-Medical): Not on file  Physical Activity:   . Days of Exercise per Week: Not on file  . Minutes of Exercise per Session: Not on file  Stress:   . Feeling of Stress : Not on file  Social Connections:   . Frequency of Communication with Friends and Family: Not on file  . Frequency of Social Gatherings with Friends and Family: Not on file  . Attends Religious Services: Not on file  . Active Member of Clubs or Organizations: Not on file  . Attends Archivist Meetings: Not on file  . Marital Status: Not on file     Vital Signs: BP (!) 146/64   Pulse 68   Temp (!) 97 F (36.1 C)   SpO2 94%   Physical Exam Vitals reviewed.  Skin:    General: Skin is warm and dry.     Comments: Site is clean and dry NT no bleeding Stat lock secure  OP cloudy brown fluid Minimal lin bag  Injection show connection to appendix per Dr Vernard Gambles  Neurological:     Mental Status: She is alert.     Imaging: CT ABDOMEN PELVIS W CONTRAST  Result Date: 01/08/2020 CLINICAL DATA:  Perforated appendicitis, status post percutaneous drainage EXAM: CT ABDOMEN AND PELVIS WITH CONTRAST TECHNIQUE: Multidetector CT imaging of the abdomen and pelvis was  performed using the standard protocol following bolus administration of intravenous contrast. CONTRAST:  136mL ISOVUE-300 IOPAMIDOL (ISOVUE-300) INJECTION 61% COMPARISON:  12/27/2019 FINDINGS: Lower chest: No acute abnormality. Hepatobiliary: No focal liver abnormality is seen. No gallstones, gallbladder wall thickening, or biliary dilatation. Pancreas: Unremarkable. No pancreatic ductal dilatation or surrounding inflammatory changes. Spleen: Normal in size without focal abnormality. Adrenals/Urinary Tract: 1 cm low-attenuation left adrenal nodule, possible nonfunctioning adenoma but nonspecific. No renal mass. No hydronephrosis. Urinary bladder incompletely distended Stomach/Bowel: Stomach is nondistended. Small  bowel decompressed. Appendix extends towards the cul-de-sac. Right transgluteal pigtail drain catheter stable in position with interval evacuation of previously noted abscess. No new or undrained extraluminal component. Some decrease in regional inflammatory changes. The colon is nondistended, unremarkable. Vascular/Lymphatic: Aortic Atherosclerosis (ICD10-170.0) without aneurysm, dissection or stenosis. No abdominal or pelvic adenopathy. Portal vein patent. Reproductive: Status post hysterectomy. No adnexal masses. Other: No ascites. No free air. Musculoskeletal: Changes of solid-appearing instrumented PLIF L4-S1. No fracture or worrisome bone lesion. IMPRESSION: 1. Interval evacuation of right pelvic abscess post pigtail drain catheter placement. Electronically Signed   By: Lucrezia Europe M.D.   On: 01/08/2020 09:29   IR Radiologist Eval & Mgmt  Result Date: 01/08/2020 Please refer to notes tab for details about interventional procedure. (Op Note)   Labs:  CBC: Recent Labs    12/27/19 1455 12/28/19 0558 12/29/19 0624 12/30/19 0402  WBC 10.5 9.8 9.3 7.7  HGB 11.3* 10.7* 10.1* 10.0*  HCT 37.0 35.0* 32.6* 32.2*  PLT 399 350 338 272    COAGS: Recent Labs    12/28/19 1722  INR 1.1     BMP: Recent Labs    12/27/19 1455 12/28/19 0558 12/29/19 0624 12/30/19 0402  NA 137 140 138 143  K 4.4 4.0 4.2 3.7  CL 102 105 106 110  CO2 26 26 25 24   GLUCOSE 95 97 102* 79  BUN 10 7* 8 <5*  CALCIUM 9.4 9.1 8.7* 8.8*  CREATININE 0.98 0.98 1.15* 0.91  GFRNONAA 59* 59* 49* >60  GFRAA >60 >60 56* >60    LIVER FUNCTION TESTS: Recent Labs    12/27/19 1455  BILITOT 0.5  AST 14*  ALT 13  ALKPHOS 89  PROT 7.5  ALBUMIN 3.1*    Assessment:  Perforated appendix with abscess TG drain placed in IR 12/28/19 CT showing great improvement in collection Injection reveals connection to appendix Will keep drain for now She sees Dr Donne Hazel 01/13/20 Continue flush for now--- once daily Plan per [facility]   Signed: Lavonia Drafts, PA-C 01/08/2020, 9:52 AM   Please refer to Dr. Vernard Gambles attestation of this note for management and plan.

## 2020-01-13 ENCOUNTER — Other Ambulatory Visit: Payer: Self-pay | Admitting: General Surgery

## 2020-01-20 NOTE — Pre-Procedure Instructions (Signed)
Your procedure is scheduled on Thursday, February 4th, from 07:30 AM to 09:30 AM.  Report to [facility] Main Entrance "A" at 05:30 A.M., and check in at the Admitting office.  Call this number if you have problems the morning of surgery:  [phone]  Call [phone] if you have any questions prior to your surgery date Monday-Friday 8am-4pm    Remember:  Do not eat after midnight the night before your surgery.  You may drink clear liquids until 04:30 AM the morning of your surgery.    Clear liquids allowed are: Water, Non-Citrus Juices (without pulp), Carbonated Beverages, Clear Tea, Black Coffee Only, and Gatorade.   Enhanced Recovery after Surgery Enhanced Recovery after Surgery is a protocol used to improve the stress on your body and your recovery after surgery.    .  . The day of surgery (if you do NOT have diabetes):  o Drink ONE (1) Pre-Surgery Clear Ensure by 04:30 AM. o This drink was given to you during your hospital  pre-op appointment visit. o Nothing else to drink after completing the  Pre-Surgery Clear Ensure.     Take these medicines the morning of surgery with A SIP OF WATER : amLODipine (NORVASC)  buPROPion (WELLBUTRIN XL) levothyroxine (SYNTHROID, LEVOTHROID)  oxyCODONE (OXY IR/ROXICODONE)- if needed  tiotropium (SPIRIVA HANDIHALER)  albuterol (PROVENTIL HFA;VENTOLIN HFA) inhaler- if needed Please bring all inhalers with you the day of surgery.   Follow your surgeon's instructions on when to stop Aspirin.  If no instructions were given by your surgeon then you will need to call the office to get those instructions.     7 days prior to surgery STOP taking any Aspirin (unless otherwise instructed by your surgeon), Aleve, Naproxen, Ibuprofen, Motrin, Advil, Goody's, BC's, all herbal medications, fish oil, and all vitamins.    The Morning of Surgery  Do not wear jewelry, make-up or nail polish.  Do not wear lotions, powders, perfumes, or deodorant.   Do not shave 48 hours prior to surgery.    Do not bring valuables to the hospital.  [facility] is not responsible for any belongings or valuables.  If you are a smoker, DO NOT Smoke 24 hours prior to surgery  If you wear a CPAP at night please bring your mask the morning of surgery   Remember that you must have someone to transport you home after your surgery, and remain with you for 24 hours if you are discharged the same day.   Please bring cases for contacts, glasses, hearing aids, dentures or bridgework because it cannot be worn into surgery.    Leave your suitcase in the car.  After surgery it may be brought to your room.  For patients admitted to the hospital, discharge time will be determined by your treatment team.  Patients discharged the day of surgery will not be allowed to drive home.    Special instructions:   [place]- Preparing For Surgery  Before surgery, you can play an important role. Because skin is not sterile, your skin needs to be as free of germs as possible. You can reduce the number of germs on your skin by washing with CHG (chlorahexidine gluconate) Soap before surgery.  CHG is an antiseptic cleaner which kills germs and bonds with the skin to continue killing germs even after washing.    Oral Hygiene is also important to reduce your risk of infection.  Remember - BRUSH YOUR TEETH THE MORNING OF SURGERY WITH YOUR REGULAR TOOTHPASTE  Please do not use if you have an allergy to CHG or antibacterial soaps. If your skin becomes reddened/irritated stop using the CHG.  Do not shave (including legs and underarms) for at least 48 hours prior to first CHG shower. It is OK to shave your face.  Please follow these instructions carefully.   1. Shower the NIGHT BEFORE SURGERY and the MORNING OF SURGERY with CHG Soap.   2. If you chose to wash your hair, wash your hair first as usual with your normal shampoo.  3. After you shampoo, rinse your hair and body  thoroughly to remove the shampoo.  4. Use CHG as you would any other liquid soap. You can apply CHG directly to the skin and wash gently with a scrungie or a clean washcloth.   5. Apply the CHG Soap to your body ONLY FROM THE NECK DOWN.  Do not use on open wounds or open sores. Avoid contact with your eyes, ears, mouth and genitals (private parts). Wash Face and genitals (private parts)  with your normal soap.   6. Wash thoroughly, paying special attention to the area where your surgery will be performed.  7. Thoroughly rinse your body with warm water from the neck down.  8. DO NOT shower/wash with your normal soap after using and rinsing off the CHG Soap.  9. Pat yourself dry with a CLEAN TOWEL.  10. Wear CLEAN PAJAMAS to bed the night before surgery, wear comfortable clothes the morning of surgery  11. Place CLEAN SHEETS on your bed the night of your first shower and DO NOT SLEEP WITH PETS.    Day of Surgery:  Please shower the morning of surgery with the CHG soap Do not apply any deodorants/lotions. Please wear clean clothes to the hospital/surgery center.   Remember to brush your teeth WITH YOUR REGULAR TOOTHPASTE.   Please read over the following fact sheets that you were given.

## 2020-01-21 ENCOUNTER — Other Ambulatory Visit: Payer: Self-pay

## 2020-01-21 ENCOUNTER — Encounter (HOSPITAL_COMMUNITY)
Admission: RE | Admit: 2020-01-21 | Discharge: 2020-01-21 | Disposition: A | Discharge status: Home / Self Care | Payer: Medicare Other | Source: Ambulatory Visit | Attending: General Surgery | Admitting: General Surgery

## 2020-01-21 ENCOUNTER — Encounter (HOSPITAL_COMMUNITY): Payer: Self-pay

## 2020-01-21 DIAGNOSIS — Z7982 Long term (current) use of aspirin: Secondary | ICD-10-CM | POA: Diagnosis not present

## 2020-01-21 DIAGNOSIS — Z01812 Encounter for preprocedural laboratory examination: Secondary | ICD-10-CM | POA: Insufficient documentation

## 2020-01-21 HISTORY — DX: Cardiac murmur, unspecified: R01.1

## 2020-01-21 HISTORY — DX: Cerebral infarction, unspecified: I63.9

## 2020-01-21 HISTORY — DX: Unspecified asthma, uncomplicated: J45.909

## 2020-01-21 HISTORY — DX: Anemia, unspecified: D64.9

## 2020-01-21 HISTORY — DX: Unspecified osteoarthritis, unspecified site: M19.90

## 2020-01-21 LAB — BASIC METABOLIC PANEL
Anion gap: 10 (ref 5–15)
BUN: 12 mg/dL (ref 8–23)
CO2: 23 mmol/L (ref 22–32)
Calcium: 9.5 mg/dL (ref 8.9–10.3)
Chloride: 105 mmol/L (ref 98–111)
Creatinine, Ser: 0.99 mg/dL (ref 0.44–1.00)
GFR calc Af Amer: >60 mL/min (ref >60)
GFR calc non Af Amer: 58 mL/min — ABNORMAL LOW (ref >60)
Glucose, Bld: 92 mg/dL (ref 70–99)
Potassium: 4.3 mmol/L (ref 3.5–5.1)
Sodium: 138 mmol/L (ref 135–145)

## 2020-01-21 LAB — CBC
HCT: 39.4 % (ref 36.0–46.0)
Hemoglobin: 12.2 g/dL (ref 12.0–15.0)
MCH: 28.2 pg (ref 26.0–34.0)
MCHC: 31 g/dL (ref 30.0–36.0)
MCV: 91.2 fL (ref 80.0–100.0)
Platelets: 327 10*3/uL (ref 150–400)
RBC: 4.32 MIL/uL (ref 3.87–5.11)
RDW: 13.9 % (ref 11.5–15.5)
WBC: 10.8 10*3/uL — ABNORMAL HIGH (ref 4.0–10.5)
nRBC: 0 % (ref 0.0–0.2)

## 2020-01-21 NOTE — Progress Notes (Addendum)
PCP - Suzanna Obey MD Cardiologist - pt denies  Chest x-ray - n/a EKG - 08/11/19 - EKG tracing requested from [place] - 08/23/10 ECHO - 12/07/18 Cardiac Cath - pt denies  Aspirin Instructions:  Per pt "my prescription ran out but I never refilled it because I knew I was going to have surgery and that Aspirin thins your blood so I just stopped taking it. I'll start it after my surgery"  Per pt "My last dose was around my last admission to the hospital"  ERAS- yes Ensure given  COVID TEST- scheduled 01/26/20 - pt verbalized knowledge of self quarantine after testing   Anesthesia review: yes - documents requested   Patient denies shortness of breath, fever, cough and chest pain at PAT appointment   All instructions explained to the patient, with a verbal understanding of the material. Patient agrees to go over the instructions while at home for a better understanding. Patient also instructed to self quarantine after being tested for COVID-19. The opportunity to ask questions was provided.

## 2020-01-22 NOTE — Anesthesia Preprocedure Evaluation (Addendum)
Anesthesia Evaluation  Patient identified by MRN, date of birth, ID band Patient awake    Reviewed: Allergy & Precautions, NPO status , Patient's Chart, lab work & pertinent test results  History of Anesthesia Complications Negative for: history of anesthetic complications  Airway Mallampati: II  TM Distance: >3 FB Neck ROM: Full    Dental no notable dental hx.    Pulmonary asthma , COPD,  COPD inhaler, former smoker,    Pulmonary exam normal        Cardiovascular hypertension, Pt. on medications Normal cardiovascular exam     Neuro/Psych negative neurological ROS  negative psych ROS   GI/Hepatic negative GI ROS, Neg liver ROS,   Endo/Other  Hypothyroidism   Renal/GU negative Renal ROS  negative genitourinary   Musculoskeletal negative musculoskeletal ROS (+)   Abdominal   Peds  Hematology negative hematology ROS (+)   Anesthesia Other Findings Day of surgery medications reviewed with patient.  Reproductive/Obstetrics negative OB ROS                           Anesthesia Physical Anesthesia Plan  ASA: II  Anesthesia Plan: General   Post-op Pain Management: GA combined w/ Regional for post-op pain   Induction: Intravenous  PONV Risk Score and Plan: 3 and Treatment may vary due to age or medical condition, Ondansetron and Dexamethasone  Airway Management Planned: Oral ETT  Additional Equipment:   Intra-op Plan:   Post-operative Plan: Extubation in OR  Informed Consent: I have reviewed the patients History and Physical, chart, labs and discussed the procedure including the risks, benefits and alternatives for the proposed anesthesia with the patient or authorized representative who has indicated his/her understanding and acceptance.     Dental advisory given  Plan Discussed with: CRNA  Anesthesia Plan Comments: (Proep labs reviewed, WNL.  EKG 08/11/19 (copy on chart): SR,  possible anterior infarct age undetermined.  TTE 12/07/18: - Left ventricle: The cavity size was normal. Wall thickness was   normal. Systolic function was normal. The estimated ejection   fraction was in the range of 55% to 60%. Wall motion was normal;   there were no regional wall motion abnormalities. Doppler   parameters are consistent with abnormal left ventricular   relaxation (grade 1 diastolic dysfunction). - Mitral valve: There was trivial regurgitation. - Right atrium: Central venous pressure (est): 3 mm Hg. - Atrial septum: No defect or patent foramen ovale was identified. - Tricuspid valve: There was trivial regurgitation. - Pulmonary arteries: PA peak pressure: 15 mm Hg (S). - Pericardium, extracardiac: A prominent pericardial fat pad was   present. )    Anesthesia Quick Evaluation

## 2020-01-26 ENCOUNTER — Other Ambulatory Visit (HOSPITAL_COMMUNITY)
Admission: RE | Admit: 2020-01-26 | Discharge: 2020-01-26 | Disposition: A | Discharge status: Home / Self Care | Payer: Medicare Other | Source: Ambulatory Visit | Attending: General Surgery | Admitting: General Surgery

## 2020-01-26 DIAGNOSIS — Z20822 Contact with and (suspected) exposure to covid-19: Secondary | ICD-10-CM | POA: Diagnosis not present

## 2020-01-26 DIAGNOSIS — Z01812 Encounter for preprocedural laboratory examination: Secondary | ICD-10-CM | POA: Insufficient documentation

## 2020-01-26 LAB — SARS CORONAVIRUS 2 (TAT 6-24 HRS): SARS Coronavirus 2: NEGATIVE

## 2020-01-29 ENCOUNTER — Other Ambulatory Visit: Payer: Self-pay

## 2020-01-29 ENCOUNTER — Ambulatory Visit (HOSPITAL_COMMUNITY): Payer: Medicare Other | Admitting: Vascular Surgery

## 2020-01-29 ENCOUNTER — Encounter (HOSPITAL_COMMUNITY)
Admission: RE | Disposition: A | Discharge status: Home / Self Care | Payer: Self-pay | Source: Home / Self Care | Attending: General Surgery

## 2020-01-29 ENCOUNTER — Observation Stay (HOSPITAL_COMMUNITY)
Admission: RE | Admit: 2020-01-29 | Discharge: 2020-01-30 | Disposition: A | Discharge status: Home / Self Care | Payer: Medicare Other | Attending: General Surgery | Admitting: General Surgery

## 2020-01-29 ENCOUNTER — Encounter (HOSPITAL_COMMUNITY): Payer: Self-pay | Admitting: General Surgery

## 2020-01-29 ENCOUNTER — Ambulatory Visit (HOSPITAL_COMMUNITY): Payer: Medicare Other | Admitting: Anesthesiology

## 2020-01-29 DIAGNOSIS — J449 Chronic obstructive pulmonary disease, unspecified: Secondary | ICD-10-CM | POA: Diagnosis not present

## 2020-01-29 DIAGNOSIS — K3533 Acute appendicitis with perforation and localized peritonitis, with abscess: Principal | ICD-10-CM | POA: Insufficient documentation

## 2020-01-29 DIAGNOSIS — M1990 Unspecified osteoarthritis, unspecified site: Secondary | ICD-10-CM | POA: Insufficient documentation

## 2020-01-29 DIAGNOSIS — I10 Essential (primary) hypertension: Secondary | ICD-10-CM | POA: Diagnosis not present

## 2020-01-29 DIAGNOSIS — E039 Hypothyroidism, unspecified: Secondary | ICD-10-CM | POA: Insufficient documentation

## 2020-01-29 DIAGNOSIS — Z9049 Acquired absence of other specified parts of digestive tract: Secondary | ICD-10-CM

## 2020-01-29 DIAGNOSIS — Z87891 Personal history of nicotine dependence: Secondary | ICD-10-CM | POA: Diagnosis not present

## 2020-01-29 HISTORY — PX: LAPAROSCOPIC APPENDECTOMY: SHX408

## 2020-01-29 SURGERY — APPENDECTOMY, LAPAROSCOPIC
Anesthesia: General | Site: Abdomen

## 2020-01-29 MED ORDER — TRAZODONE HCL 100 MG PO TABS
100.0000 mg | ORAL_TABLET | Freq: Every day | ORAL | Status: DC
  Administered 2020-01-29: 20:00:00 100 mg via ORAL
  Filled 2020-01-29: qty 1

## 2020-01-29 MED ORDER — OXYCODONE HCL 5 MG PO TABS: 5.0000 mg | ORAL_TABLET | Freq: Four times a day (QID) | ORAL | 0 refills | Status: DC | PRN

## 2020-01-29 MED ORDER — ACETAMINOPHEN 500 MG PO TABS
1000.0000 mg | ORAL_TABLET | Freq: Four times a day (QID) | ORAL | Status: DC
  Administered 2020-01-29 – 2020-01-30 (×4): 1000 mg via ORAL
  Filled 2020-01-29 (×4): qty 2

## 2020-01-29 MED ORDER — BUPROPION HCL ER (XL) 150 MG PO TB24
150.0000 mg | ORAL_TABLET | Freq: Every day | ORAL | Status: DC
  Administered 2020-01-30: 11:00:00 150 mg via ORAL
  Filled 2020-01-29 (×2): qty 1

## 2020-01-29 MED ORDER — FENTANYL CITRATE (PF) 250 MCG/5ML IJ SOLN
INTRAMUSCULAR | Status: AC
  Filled 2020-01-29: qty 5

## 2020-01-29 MED ORDER — LACTATED RINGERS IV SOLN: INTRAVENOUS | Status: DC

## 2020-01-29 MED ORDER — OXYCODONE HCL 5 MG PO TABS: 5.0000 mg | ORAL_TABLET | Freq: Once | ORAL | Status: DC | PRN

## 2020-01-29 MED ORDER — SENNOSIDES-DOCUSATE SODIUM 8.6-50 MG PO TABS
2.0000 | ORAL_TABLET | Freq: Every day | ORAL | Status: DC
  Administered 2020-01-29: 20:00:00 2 via ORAL
  Filled 2020-01-29: qty 2

## 2020-01-29 MED ORDER — BUPIVACAINE HCL 0.25 % IJ SOLN
INTRAMUSCULAR | Status: DC | PRN
  Administered 2020-01-29: 60 mL

## 2020-01-29 MED ORDER — ALBUTEROL SULFATE (2.5 MG/3ML) 0.083% IN NEBU: 3.0000 mL | INHALATION_SOLUTION | RESPIRATORY_TRACT | Status: DC | PRN

## 2020-01-29 MED ORDER — CEFAZOLIN SODIUM-DEXTROSE 2-4 GM/100ML-% IV SOLN
2.0000 g | INTRAVENOUS | Status: AC
  Administered 2020-01-29: 2 g via INTRAVENOUS

## 2020-01-29 MED ORDER — SODIUM CHLORIDE 0.9 % IR SOLN
Status: DC | PRN
  Administered 2020-01-29: 1000 mL

## 2020-01-29 MED ORDER — ONDANSETRON HCL 4 MG/2ML IJ SOLN
INTRAMUSCULAR | Status: DC | PRN
  Administered 2020-01-29: 4 mg via INTRAVENOUS

## 2020-01-29 MED ORDER — FENTANYL CITRATE (PF) 100 MCG/2ML IJ SOLN
INTRAMUSCULAR | Status: AC
  Administered 2020-01-29: 10:00:00 50 ug via INTRAVENOUS
  Filled 2020-01-29: qty 2

## 2020-01-29 MED ORDER — BUPIVACAINE HCL (PF) 0.25 % IJ SOLN
INTRAMUSCULAR | Status: AC
  Filled 2020-01-29: qty 30

## 2020-01-29 MED ORDER — ASPIRIN EC 81 MG PO TBEC
81.0000 mg | DELAYED_RELEASE_TABLET | Freq: Every day | ORAL | Status: DC
  Administered 2020-01-30: 81 mg via ORAL
  Filled 2020-01-29 (×2): qty 1

## 2020-01-29 MED ORDER — LEVOTHYROXINE SODIUM 50 MCG PO TABS
50.0000 ug | ORAL_TABLET | Freq: Every day | ORAL | Status: DC
  Administered 2020-01-30: 05:00:00 50 ug via ORAL
  Filled 2020-01-29: qty 1

## 2020-01-29 MED ORDER — DEXAMETHASONE SODIUM PHOSPHATE 10 MG/ML IJ SOLN
INTRAMUSCULAR | Status: AC
  Filled 2020-01-29: qty 1

## 2020-01-29 MED ORDER — AMLODIPINE BESYLATE 2.5 MG PO TABS
2.5000 mg | ORAL_TABLET | Freq: Every day | ORAL | Status: DC
  Administered 2020-01-30: 11:00:00 2.5 mg via ORAL
  Filled 2020-01-29 (×2): qty 1

## 2020-01-29 MED ORDER — CEFAZOLIN SODIUM-DEXTROSE 2-4 GM/100ML-% IV SOLN
INTRAVENOUS | Status: AC
  Filled 2020-01-29: qty 100

## 2020-01-29 MED ORDER — PROPOFOL 10 MG/ML IV BOLUS
INTRAVENOUS | Status: DC | PRN
  Administered 2020-01-29: 120 mg via INTRAVENOUS

## 2020-01-29 MED ORDER — PHENYLEPHRINE 40 MCG/ML (10ML) SYRINGE FOR IV PUSH (FOR BLOOD PRESSURE SUPPORT)
PREFILLED_SYRINGE | INTRAVENOUS | Status: DC | PRN
  Administered 2020-01-29: 80 ug via INTRAVENOUS
  Administered 2020-01-29: 40 ug via INTRAVENOUS

## 2020-01-29 MED ORDER — ACETAMINOPHEN 500 MG PO TABS: 1000.0000 mg | ORAL_TABLET | Freq: Once | ORAL | Status: DC

## 2020-01-29 MED ORDER — ENOXAPARIN SODIUM 40 MG/0.4ML ~~LOC~~ SOLN
40.0000 mg | SUBCUTANEOUS | Status: DC
  Filled 2020-01-29: qty 0.4

## 2020-01-29 MED ORDER — BUPIVACAINE-EPINEPHRINE (PF) 0.25% -1:200000 IJ SOLN
INTRAMUSCULAR | Status: DC | PRN
  Administered 2020-01-29 (×2): 15 mg via PERINEURAL

## 2020-01-29 MED ORDER — FLUTICASONE FUROATE-VILANTEROL 100-25 MCG/INH IN AEPB
1.0000 | INHALATION_SPRAY | Freq: Every day | RESPIRATORY_TRACT | Status: DC
  Administered 2020-01-30: 1 via RESPIRATORY_TRACT
  Filled 2020-01-29: qty 28

## 2020-01-29 MED ORDER — MORPHINE SULFATE (PF) 2 MG/ML IV SOLN: 1.0000 mg | INTRAVENOUS | Status: DC | PRN

## 2020-01-29 MED ORDER — SIMETHICONE 80 MG PO CHEW: 40.0000 mg | CHEWABLE_TABLET | Freq: Four times a day (QID) | ORAL | Status: DC | PRN

## 2020-01-29 MED ORDER — LIDOCAINE 2% (20 MG/ML) 5 ML SYRINGE
INTRAMUSCULAR | Status: AC
  Filled 2020-01-29: qty 5

## 2020-01-29 MED ORDER — LIDOCAINE 2% (20 MG/ML) 5 ML SYRINGE
INTRAMUSCULAR | Status: DC | PRN
  Administered 2020-01-29: 60 mg via INTRAVENOUS

## 2020-01-29 MED ORDER — 0.9 % SODIUM CHLORIDE (POUR BTL) OPTIME
TOPICAL | Status: DC | PRN
  Administered 2020-01-29 (×2): 1000 mL

## 2020-01-29 MED ORDER — ONDANSETRON HCL 4 MG/2ML IJ SOLN: 4.0000 mg | Freq: Four times a day (QID) | INTRAMUSCULAR | Status: DC | PRN

## 2020-01-29 MED ORDER — FENTANYL CITRATE (PF) 250 MCG/5ML IJ SOLN
INTRAMUSCULAR | Status: DC | PRN
  Administered 2020-01-29 (×5): 50 ug via INTRAVENOUS

## 2020-01-29 MED ORDER — PROMETHAZINE HCL 25 MG/ML IJ SOLN: 6.2500 mg | INTRAMUSCULAR | Status: DC | PRN

## 2020-01-29 MED ORDER — STERILE WATER FOR IRRIGATION IR SOLN
Status: DC | PRN
  Administered 2020-01-29: 1000 mL

## 2020-01-29 MED ORDER — ONDANSETRON 4 MG PO TBDP: 4.0000 mg | ORAL_TABLET | Freq: Four times a day (QID) | ORAL | Status: DC | PRN

## 2020-01-29 MED ORDER — ENSURE PRE-SURGERY PO LIQD: 296.0000 mL | Freq: Once | ORAL | Status: DC

## 2020-01-29 MED ORDER — OXYCODONE HCL 5 MG/5ML PO SOLN: 5.0000 mg | Freq: Once | ORAL | Status: DC | PRN

## 2020-01-29 MED ORDER — BUPIVACAINE LIPOSOME 1.3 % IJ SUSP
INTRAMUSCULAR | Status: DC | PRN
  Administered 2020-01-29 (×2): 10 mg via PERINEURAL

## 2020-01-29 MED ORDER — ACETAMINOPHEN 500 MG PO TABS: 1000.0000 mg | ORAL_TABLET | ORAL | Status: DC

## 2020-01-29 MED ORDER — SUGAMMADEX SODIUM 200 MG/2ML IV SOLN
INTRAVENOUS | Status: DC | PRN
  Administered 2020-01-29: 260 mg via INTRAVENOUS

## 2020-01-29 MED ORDER — TIOTROPIUM BROMIDE MONOHYDRATE 18 MCG IN CAPS: 18.0000 ug | ORAL_CAPSULE | Freq: Every day | RESPIRATORY_TRACT | Status: DC

## 2020-01-29 MED ORDER — METHOCARBAMOL 500 MG PO TABS
500.0000 mg | ORAL_TABLET | Freq: Four times a day (QID) | ORAL | Status: DC | PRN
  Administered 2020-01-29: 500 mg via ORAL
  Filled 2020-01-29: qty 1

## 2020-01-29 MED ORDER — FENTANYL CITRATE (PF) 100 MCG/2ML IJ SOLN
25.0000 ug | INTRAMUSCULAR | Status: DC | PRN
  Administered 2020-01-29: 09:00:00 50 ug via INTRAVENOUS

## 2020-01-29 MED ORDER — MIDAZOLAM HCL 2 MG/2ML IJ SOLN
INTRAMUSCULAR | Status: DC | PRN
  Administered 2020-01-29 (×2): 1 mg via INTRAVENOUS

## 2020-01-29 MED ORDER — DEXAMETHASONE SODIUM PHOSPHATE 10 MG/ML IJ SOLN
INTRAMUSCULAR | Status: DC | PRN
  Administered 2020-01-29: 4 mg via INTRAVENOUS

## 2020-01-29 MED ORDER — SODIUM CHLORIDE 0.9 % IV SOLN: INTRAVENOUS | Status: DC

## 2020-01-29 MED ORDER — PROPOFOL 10 MG/ML IV BOLUS
INTRAVENOUS | Status: AC
  Filled 2020-01-29: qty 20

## 2020-01-29 MED ORDER — ROCURONIUM BROMIDE 10 MG/ML (PF) SYRINGE
PREFILLED_SYRINGE | INTRAVENOUS | Status: DC | PRN
  Administered 2020-01-29: 40 mg via INTRAVENOUS

## 2020-01-29 MED ORDER — ONDANSETRON HCL 4 MG/2ML IJ SOLN
INTRAMUSCULAR | Status: AC
  Filled 2020-01-29: qty 2

## 2020-01-29 MED ORDER — MIDAZOLAM HCL 2 MG/2ML IJ SOLN
INTRAMUSCULAR | Status: AC
  Filled 2020-01-29: qty 2

## 2020-01-29 MED ORDER — OXYCODONE HCL 5 MG PO TABS
5.0000 mg | ORAL_TABLET | ORAL | Status: DC | PRN
  Administered 2020-01-29 – 2020-01-30 (×3): 10 mg via ORAL
  Filled 2020-01-29 (×3): qty 2

## 2020-01-29 MED ORDER — ACETAMINOPHEN 500 MG PO TABS
ORAL_TABLET | ORAL | Status: AC
  Administered 2020-01-29: 06:00:00 1000 mg via ORAL
  Filled 2020-01-29: qty 2

## 2020-01-29 SURGICAL SUPPLY — 50 items
APPLIER CLIP ROT 10 11.4 M/L (STAPLE)
BIOPATCH RED 1 DISK 7.0 (GAUZE/BANDAGES/DRESSINGS) ×2 IMPLANT
BIOPATCH RED 1IN DISK 7.0MM (GAUZE/BANDAGES/DRESSINGS) ×1
CANISTER SUCT 3000ML PPV (MISCELLANEOUS) ×3 IMPLANT
CATH FOLEY 2WAY SLVR  5CC 14FR (CATHETERS) ×2
CATH FOLEY 2WAY SLVR 5CC 14FR (CATHETERS) ×1 IMPLANT
CHLORAPREP W/TINT 26 (MISCELLANEOUS) ×3 IMPLANT
CLIP APPLIE ROT 10 11.4 M/L (STAPLE) IMPLANT
CLOSURE WOUND 1/2 X4 (GAUZE/BANDAGES/DRESSINGS) ×1
COVER SURGICAL LIGHT HANDLE (MISCELLANEOUS) ×3 IMPLANT
COVER WAND RF STERILE (DRAPES) IMPLANT
CUTTER FLEX LINEAR 45M (STAPLE) ×3 IMPLANT
DERMABOND ADVANCED (GAUZE/BANDAGES/DRESSINGS) ×2
DERMABOND ADVANCED .7 DNX12 (GAUZE/BANDAGES/DRESSINGS) ×1 IMPLANT
DRAIN CHANNEL 19F RND (DRAIN) ×3 IMPLANT
DRSG TEGADERM 4X4.75 (GAUZE/BANDAGES/DRESSINGS) ×3 IMPLANT
ELECT REM PT RETURN 9FT ADLT (ELECTROSURGICAL) ×3
ELECTRODE REM PT RTRN 9FT ADLT (ELECTROSURGICAL) ×1 IMPLANT
EVACUATOR SILICONE 100CC (DRAIN) ×3 IMPLANT
GLOVE BIO SURGEON STRL SZ7 (GLOVE) ×3 IMPLANT
GLOVE BIOGEL PI IND STRL 7.5 (GLOVE) ×1 IMPLANT
GLOVE BIOGEL PI INDICATOR 7.5 (GLOVE) ×2
GOWN STRL REUS W/ TWL LRG LVL3 (GOWN DISPOSABLE) ×3 IMPLANT
GOWN STRL REUS W/TWL LRG LVL3 (GOWN DISPOSABLE) ×6
GRASPER SUT TROCAR 14GX15 (MISCELLANEOUS) ×3 IMPLANT
KIT BASIN OR (CUSTOM PROCEDURE TRAY) ×3 IMPLANT
KIT TURNOVER KIT B (KITS) ×3 IMPLANT
NS IRRIG 1000ML POUR BTL (IV SOLUTION) ×3 IMPLANT
PAD ARMBOARD 7.5X6 YLW CONV (MISCELLANEOUS) ×6 IMPLANT
POUCH RETRIEVAL ECOSAC 10 (ENDOMECHANICALS) ×1 IMPLANT
POUCH RETRIEVAL ECOSAC 10MM (ENDOMECHANICALS) ×2
RELOAD 45 VASCULAR/THIN (ENDOMECHANICALS) IMPLANT
RELOAD STAPLE TA45 3.5 REG BLU (ENDOMECHANICALS) ×6 IMPLANT
SCISSORS LAP 5X35 DISP (ENDOMECHANICALS) IMPLANT
SET IRRIG TUBING LAPAROSCOPIC (IRRIGATION / IRRIGATOR) ×3 IMPLANT
SET TUBE SMOKE EVAC HIGH FLOW (TUBING) ×3 IMPLANT
SHEARS HARMONIC ACE PLUS 36CM (ENDOMECHANICALS) ×3 IMPLANT
SLEEVE ENDOPATH XCEL 5M (ENDOMECHANICALS) ×6 IMPLANT
SPECIMEN JAR SMALL (MISCELLANEOUS) ×3 IMPLANT
STRIP CLOSURE SKIN 1/2X4 (GAUZE/BANDAGES/DRESSINGS) ×2 IMPLANT
SUT ETHILON 2 0 FS 18 (SUTURE) ×3 IMPLANT
SUT MNCRL AB 4-0 PS2 18 (SUTURE) ×3 IMPLANT
SUT VICRYL 0 UR6 27IN ABS (SUTURE) ×6 IMPLANT
TOWEL GREEN STERILE (TOWEL DISPOSABLE) ×3 IMPLANT
TOWEL GREEN STERILE FF (TOWEL DISPOSABLE) ×3 IMPLANT
TRAY FOLEY MTR SLVR 16FR STAT (SET/KITS/TRAYS/PACK) IMPLANT
TRAY LAPAROSCOPIC MC (CUSTOM PROCEDURE TRAY) ×3 IMPLANT
TROCAR XCEL BLUNT TIP 100MML (ENDOMECHANICALS) ×3 IMPLANT
TROCAR XCEL NON-BLD 5MMX100MML (ENDOMECHANICALS) ×3 IMPLANT
WATER STERILE IRR 1000ML POUR (IV SOLUTION) ×3 IMPLANT

## 2020-01-29 NOTE — Anesthesia Postprocedure Evaluation (Signed)
Anesthesia Post Note  Patient: Jean Shaffer  Procedure(s) Performed: LAPAROSCOPIC  APPENDECTOMY (N/A Abdomen)     Patient location during evaluation: PACU Anesthesia Type: General Level of consciousness: awake and alert and oriented Pain management: pain level controlled Vital Signs Assessment: post-procedure vital signs reviewed and stable Respiratory status: spontaneous breathing, nonlabored ventilation and respiratory function stable Cardiovascular status: blood pressure returned to baseline Postop Assessment: no apparent nausea or vomiting Anesthetic complications: no    Last Vitals:  Vitals:   01/29/20 0959 01/29/20 1001  BP: (!) 147/64   Pulse: 65   Resp: 16   Temp: 36.4 C   SpO2: 100% 100%    Last Pain:  Vitals:   01/29/20 0959  TempSrc: Axillary  PainSc:                  Brennan Bailey

## 2020-01-29 NOTE — Interval H&P Note (Signed)
History and Physical Interval Note:  01/29/2020 7:05 AM  Jean Shaffer  has presented today for surgery, with the diagnosis of appendiceal fistula.  The various methods of treatment have been discussed with the patient and family. After consideration of risks, benefits and other options for treatment, the patient has consented to  Procedure(s): LAPAROSCOPIC, POSSIBLE OPEN APPENDECTOMY (N/A) as a surgical intervention.  The patient's history has been reviewed, patient examined, no change in status, stable for surgery.  I have reviewed the patient's chart and labs.  Questions were answered to the patient's satisfaction.     Rolm Bookbinder

## 2020-01-29 NOTE — Transfer of Care (Signed)
Immediate Anesthesia Transfer of Care Note  Patient: Jean Shaffer  Procedure(s) Performed: LAPAROSCOPIC  APPENDECTOMY (N/A Abdomen)  Patient Location: PACU  Anesthesia Type:General  Level of Consciousness: drowsy and patient cooperative  Airway & Oxygen Therapy: Patient Spontanous Breathing and Patient connected to face mask oxygen  Post-op Assessment: Report given to RN and Post -op Vital signs reviewed and stable  Post vital signs: Reviewed and stable  Last Vitals:  Vitals Value Taken Time  BP 146/69 01/29/20 0901  Temp    Pulse 73 01/29/20 0903  Resp 14 01/29/20 0903  SpO2 96 % 01/29/20 0903  Vitals shown include unvalidated device data.  Last Pain:  Vitals:   01/29/20 0614  TempSrc:   PainSc: 0-No pain      Patients Stated Pain Goal: 3 ([id number])  Complications: No apparent anesthesia complications

## 2020-01-29 NOTE — Discharge Instructions (Signed)
[facility] LAPAROSCOPIC SURGERY: POST OP INSTRUCTIONS  Always review your discharge instruction sheet given to you by the facility where your surgery was performed. IF YOU HAVE DISABILITY OR FAMILY LEAVE FORMS, YOU MUST BRING THEM TO THE OFFICE FOR PROCESSING.   DO NOT GIVE THEM TO YOUR DOCTOR.  1. A prescription for pain medication may be given to you upon discharge.  Take your pain medication as prescribed, if needed.  If narcotic pain medicine is not needed, then you may take acetaminophen (Tylenol), naprosyn (Alleve), or ibuprofen (Advil) as needed. 2. Take your usually prescribed medications unless otherwise directed. 3. If you need a refill on your pain medication, please contact your pharmacy.  They will contact our office to request authorization. Prescriptions will not be filled after 5pm or on week-ends. 4. You should follow a light diet the first few days after arrival home, such as soup and crackers, etc.  Be sure to include lots of fluids daily. 5. Most patients will experience some swelling and bruising in the area of the incisions.  Ice packs will help.  Swelling and bruising can take several days to resolve.  6. It is common to experience some constipation if taking pain medication after surgery.  Increasing fluid intake and taking a stool softener (such as Colace) will usually help or prevent this problem from occurring.  A mild laxative (Milk of Magnesia or Miralax) should be taken according to package instructions if there are no bowel movements after 48 hours. 7. Unless discharge instructions indicate otherwise, you may remove your bandages 48 hours after surgery, and you may shower at that time.  You may have steri-strips (small skin tapes) in place directly over the incision.  These strips should be left on the skin for 7-10 days.  If your surgeon used skin glue on the incision, you may shower in 24 hours.  The glue will flake  off over the next 2-3 weeks.  Any sutures or staples will be removed at the office during your follow-up visit. 8. ACTIVITIES:  You may resume regular (light) daily activities beginning the next day--such as daily self-care, walking, climbing stairs--gradually increasing activities as tolerated.  You may have sexual intercourse when it is comfortable.  Refrain from any heavy lifting or straining until approved by your doctor. a. You may drive when you are no longer taking prescription pain medication, you can comfortably wear a seatbelt, and you can safely maneuver your car and apply brakes. b. RETURN TO WORK:  __________________________________________________________ 9. You should see your doctor in the office for a follow-up appointment approximately 2-3 weeks after your surgery.  Make sure that you call for this appointment within a day or two after you arrive home to insure a convenient appointment time. 10. OTHER INSTRUCTIONS: __________________________________________________________________________________________________________________________ __________________________________________________________________________________________________________________________ WHEN TO CALL YOUR DOCTOR: 1. Fever over 101.0 2. Inability to urinate 3. Continued bleeding from incision. 4. Increased pain, redness, or drainage from the incision. 5. Increasing abdominal pain  The clinic staff is available to answer your questions during regular business hours.  Please don't hesitate to call and ask to speak to one of the nurses for clinical concerns.  If you have a medical emergency, go to the nearest emergency room or call 911.  A surgeon from [facility] is always on call at the hospital. [address], [address], [place], [place]  [address] ? [address] [address], , [place]   [address] [phone] ? [phone] ? [phone] Web site: [email or web address]  

## 2020-01-29 NOTE — H&P (Signed)
69 yof I admitted 1/2 with perforated appendicitis and pelvic abscess. She had tg drain placed on 1/3 and improved. culture showed gpc and gnr. she improved and was sent home. she is doing well. has some constipation. she is utd on csc and had one not long before this she tells me. she had follow up ct scan that showed resolution of abscess but on injection has connection to appendix still also. no fevers now. drain still in and to gravity. she is here to discuss options  Past Surgical History Emeline Gins, [place]; 01/13/2020 4:24 PM) Colon Polyp Removal - Colonoscopy  Hysterectomy (not due to cancer) - Partial  Spinal Surgery - Lower Back  Spinal Surgery - Neck   Diagnostic Studies History Emeline Gins, CMA; 01/13/2020 4:24 PM) Colonoscopy  1-5 years ago Mammogram  within last year  Allergies Emeline Gins, CMA; 01/13/2020 4:25 PM) No Known Drug Allergies [01/13/2020]: Allergies Reconciled   Medication History Emeline Gins, CMA; 01/13/2020 4:26 PM) traZODone HCl (50MG  Tablet, Oral) Active. amLODIPine Besylate (2.5MG  Tablet, Oral) Active. Wellbutrin (100MG  Tablet, Oral) Active. oxyCODONE HCl (5MG  Tablet, Oral) Active. Spiriva Respimat (1.25MCG/ACT Aerosol Soln, Inhalation) Active. Medications Reconciled  Social History Emeline Gins, [place]; 01/13/2020 4:24 PM) Alcohol use  Occasional alcohol use. Caffeine use  Carbonated beverages, Tea. No drug use  Tobacco use  Former smoker.  Family History Emeline Gins, [place]; 01/13/2020 4:24 PM) Arthritis  Father. Cervical Cancer  Mother. Heart Disease  Father. Malignant Neoplasm Of Pancreas  Father. Respiratory Condition  Father.  Pregnancy / Birth History Emeline Gins, [place]; 01/13/2020 4:24 PM) Age at menarche  43 years. Age of menopause  17-55 Gravida  2 Maternal age  73-20 Para  2  Other Problems Emeline Gins, [place]; 01/13/2020 4:24 PM) Arthritis  Asthma  Back Pain  Chronic  Obstructive Lung Disease  High blood pressure  Thyroid Disease    Review of Systems Emeline Gins CMA; 01/13/2020 4:24 PM) General Present- Fatigue. Not Present- Appetite Loss, Chills, Fever, Night Sweats, Weight Gain and Weight Loss. Skin Not Present- Change in Wart/Mole, Dryness, Hives, Jaundice, New Lesions, Non-Healing Wounds, Rash and Ulcer. HEENT Present- Ringing in the Ears, Seasonal Allergies and Wears glasses/contact lenses. Not Present- Earache, Hearing Loss, Hoarseness, Nose Bleed, Oral Ulcers, Sinus Pain, Sore Throat, Visual Disturbances and Yellow Eyes. Respiratory Present- Snoring. Not Present- Bloody sputum, Chronic Cough, Difficulty Breathing and Wheezing. Breast Not Present- Breast Mass, Breast Pain, Nipple Discharge and Skin Changes. Cardiovascular Not Present- Chest Pain, Difficulty Breathing Lying Down, Leg Cramps, Palpitations, Rapid Heart Rate, Shortness of Breath and Swelling of Extremities. Gastrointestinal Present- Abdominal Pain and Constipation. Not Present- Bloating, Bloody Stool, Change in Bowel Habits, Chronic diarrhea, Difficulty Swallowing, Excessive gas, Gets full quickly at meals, Hemorrhoids, Indigestion, Nausea, Rectal Pain and Vomiting. Female Genitourinary Present- Urgency. Not Present- Frequency, Nocturia, Painful Urination and Pelvic Pain. Musculoskeletal Present- Back Pain, Joint Pain and Joint Stiffness. Not Present- Muscle Pain, Muscle Weakness and Swelling of Extremities. Neurological Not Present- Decreased Memory, Fainting, Headaches, Numbness, Seizures, Tingling, Tremor, Trouble walking and Weakness. Psychiatric Not Present- Anxiety, Bipolar, Change in Sleep Pattern, Depression, Fearful and Frequent crying. Endocrine Not Present- Cold Intolerance, Excessive Hunger, Hair Changes, Heat Intolerance, Hot flashes and New Diabetes. Hematology Present- Gland problems. Not Present- Blood Thinners, Easy Bruising, Excessive bleeding, HIV and Persistent  Infections.  Vitals Emeline Gins CMA; 01/13/2020 4:25 PM) 01/13/2020 4:24 PM Weight: 143.8 lb Height: 64.5in Body Surface Area: 1.71 m Body Mass Index: 24.3 kg/m  Temp.: 97.69F  Pulse:  92 (Regular)  BP: 112/72 (Sitting, Left Arm, Standard) Physical Exam Rolm Bookbinder MD; 01/13/2020 9:00 PM) General Mental Status-Alert. Orientation-Oriented X3. 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 I think with abscess resolved and fistula to drain she would merit lap appy. we discussed surgery and risks of open procedure, bleeding, infection, injury requiring repair. I think there is not another good option and she really would like to have the tg drain out. we discussed covid testing and how surgery is done right now. will plan on scheduling asap

## 2020-01-29 NOTE — Op Note (Signed)
Preoperative diagnosis: Perforated appendicitis status post percutaneous drain with persistent appendiceal fistula Postoperative diagnosis: Same as above Procedure: Laparoscopic appendectomy Surgeon: Dr. Serita Grammes Anesthesia: General with bilateral tap block Estimated blood loss: Minimal Complications: None Drains: 19 French Blake drain to the pelvis Specimens: Appendix to pathology Sponge and count was correct at completion Disposition recovery stable condition  Indications:This is a 69 year old female who was admitted January 2 with perforated appendicitis and a pelvic abscess.  She had a transgluteal drain placed Which she improved from and then was sent home.  She is up-to-date on her colonoscopy.  She had a follow-up CT scan that showed the abscess was resolved but she had a appendiceal fistula.  I discussed going to the operating room for a laparoscopic possible open appendectomy.  Procedure: After informed consent was obtained the patient first underwent bilateral tap blocks.  She was given antibiotics.  SCDs were placed.  She was placed under general anesthesia without complication.  She had a Foley catheter placed.  We had to use a smaller Foley and I had to dilate her meatus to place the Foley catheter.  She was then prepped and draped in the standard sterile surgical fashion.  Surgical timeout was then performed.  I filtrated Marcaine below the umbilicus.  I then made a vertical incision.  I grasped the fascia and entered it sharply.  The peritoneum was entered bluntly.  I then placed a 0 Vicryl pursestring suture through the fascia.  I inserted a Hassan trocar and insufflated the abdomen to 15 mmHg pressure.  She had a fair amount of adhesions from her low midline where there was bowel adherent to the abdominal wall.  I placed a midline 5 mm trocar and two 5 mm trochars on the right side without complication.  I then took down some of the adhesions bluntly.  She appeared to have a  small hernia there that I left alone.  I then was able to identify her cecum and her appendix tracking into her pelvis.  I was able to bluntly remove the appendix from the pelvis.  There was no active infection really.  The appendix was missing its tip.  It was still clearly leaking.  I was able to then mobilize this back into the abdomen out of the pelvis.  I copiously irrigated the pelvis.  I had remove the transgluteal drain prior to beginning as well.  I then took the appendiceal mesentery with the harmonic scalpel.  I then remove the appendix as well as a small cuff of cecum using a GIA stapler with a blue load.  I then placed this in a retrieval bag and removed it from the abdomen.  Hemostasis was observed.  I placed a 64 [place] Blake drain into the pelvis.  There is no evidence of any injury.  I then removed the [facility] trocar and tied my pursestring down.  I placed an additional 0 Vicryl suture through the fascia with the PMI device.  I then desufflated the abdomen.  The sites were all closed with 4-0 Monocryl and glue.  The drain was secured with 2-0 nylon suture.  She tolerated this well was extubated and transferred to the recovery room in stable condition.

## 2020-01-29 NOTE — Anesthesia Procedure Notes (Signed)
Anesthesia Regional Block: TAP block   Pre-Anesthetic Checklist: ,, timeout performed, Correct Patient, Correct Site, Correct Laterality, Correct Procedure, Correct Position, site marked, Risks and benefits discussed, pre-op evaluation,  At surgeon's request and post-op pain management  Laterality: Right  Prep: Maximum Sterile Barrier Precautions used, chloraprep       Needles:  Injection technique: Single-shot  Needle Type: Echogenic Stimulator Needle     Needle Length: 9cm  Needle Gauge: 21     Additional Needles:   Narrative:  Start time: 01/29/2020 7:11 AM End time: 01/29/2020 7:13 AM Injection made incrementally with aspirations every 5 mL. Anesthesiologist: Brennan Bailey, MD  Additional Notes: Risks, benefits, and alternative discussed. Patient gave consent for procedure. Patient prepped and draped in sterile fashion. Sedation administered, patient remains easily responsive to voice. Relevant anatomy identified with ultrasound guidance. Local anesthetic given in 5cc increments with no signs or symptoms of intravascular injection. No pain or paraesthesias with injection. Patient monitored throughout procedure with signs of LAST or immediate complications. Tolerated well. Ultrasound image placed in chart. Tawny Asal, MD

## 2020-01-29 NOTE — Anesthesia Procedure Notes (Signed)
Anesthesia Regional Block: TAP block   Pre-Anesthetic Checklist: ,, timeout performed, Correct Patient, Correct Site, Correct Laterality, Correct Procedure, Correct Position, site marked, Risks and benefits discussed, pre-op evaluation,  At surgeon's request and post-op pain management  Laterality: Left  Prep: Maximum Sterile Barrier Precautions used, chloraprep       Needles:  Injection technique: Single-shot  Needle Type: Echogenic Stimulator Needle     Needle Length: 9cm  Needle Gauge: 21     Additional Needles:   Narrative:  Start time: 01/29/2020 7:14 AM End time: 01/29/2020 7:17 AM Injection made incrementally with aspirations every 5 mL. Anesthesiologist: Brennan Bailey, MD  Additional Notes: Risks, benefits, and alternative discussed. Patient gave consent for procedure. Patient prepped and draped in sterile fashion. Sedation administered, patient remains easily responsive to voice. Relevant anatomy identified with ultrasound guidance. Local anesthetic given in 5cc increments with no signs or symptoms of intravascular injection. No pain or paraesthesias with injection. Patient monitored throughout procedure with signs of LAST or immediate complications. Tolerated well. Ultrasound image placed in chart. Tawny Asal, MD

## 2020-01-29 NOTE — Anesthesia Procedure Notes (Signed)
Procedure Name: Intubation Date/Time: 01/29/2020 7:44 AM Performed by: Bryson Corona, CRNA Pre-anesthesia Checklist: Patient identified, Emergency Drugs available, Suction available and Patient being monitored Patient Re-evaluated:Patient Re-evaluated prior to induction Oxygen Delivery Method: Circle System Utilized Preoxygenation: Pre-oxygenation with 100% oxygen Induction Type: IV induction Ventilation: Mask ventilation without difficulty Laryngoscope Size: Miller and 2 Grade View: Grade I Tube type: Oral Tube size: 7.0 mm Number of attempts: 1 Airway Equipment and Method: Stylet and Oral airway Placement Confirmation: ETT inserted through vocal cords under direct vision,  positive ETCO2 and breath sounds checked- equal and bilateral Secured at: 20 cm Tube secured with: Tape Dental Injury: Teeth and Oropharynx as per pre-operative assessment

## 2020-01-30 DIAGNOSIS — K3533 Acute appendicitis with perforation and localized peritonitis, with abscess: Secondary | ICD-10-CM | POA: Diagnosis not present

## 2020-01-30 LAB — SURGICAL PATHOLOGY

## 2020-01-30 MED ORDER — OXYCODONE HCL 5 MG PO TABS: 5.0000 mg | ORAL_TABLET | ORAL | 0 refills | Status: DC | PRN

## 2020-01-30 NOTE — Discharge Summary (Signed)
Physician Discharge Summary  Patient ID: Jean Shaffer MRN: [id number] DOB/AGE: [birth year] 69 y.o.  Admit date: 01/29/2020 Discharge date: 01/30/2020  Admission Diagnoses: Perforated appendicitis with abscess s/p perc drain  Discharge Diagnoses:  Active Problems:   S/P laparoscopic appendectomy   Discharged Condition: good  Hospital Course: 69 yof who in early February presented with pelvic abscess and vaginal drainage from perforated appendicitis. She was managed with perc drain and this resolved. She had persistent appendiceal fistula and I discussed lap appy with her. I did lap appy yesterday with loa due to prior surgeries and placed a new pelvic drain with removal of her tg drain.  She has done well and is ready for discharge today  Consults: None  Significant Diagnostic Studies: none  Treatments: surgery: lap appy  Discharge Exam: Blood pressure 130/62, pulse 84, temperature 98.6 F (37 C), temperature source Oral, resp. rate 13, height 5' 4.5" (1.638 m), weight 64.9 kg, SpO2 95 %. GI: soft approp tender incisoins clean, drain serosang  Disposition: Discharge disposition: 01-Home or Self Care        Allergies as of 01/30/2020   No Known Allergies     Medication List    TAKE these medications   acetaminophen 325 MG tablet Commonly known as: TYLENOL Take 2 tablets (650 mg total) by mouth every 6 (six) hours as needed for mild pain (or temp > 100).   albuterol 108 (90 Base) MCG/ACT inhaler Commonly known as: VENTOLIN HFA Inhale 2 puffs into the lungs every 4 (four) hours as needed for shortness of breath or wheezing.   amLODipine 2.5 MG tablet Commonly known as: NORVASC Take 2.5 mg by mouth daily.   aspirin EC 81 MG tablet Take 81 mg by mouth daily.   Breo Ellipta 100-25 MCG/INH Aepb Generic drug: fluticasone furoate-vilanterol Inhale 1 puff into the lungs daily with lunch.   buPROPion 150 MG 24 hr tablet Commonly known as: WELLBUTRIN XL Take 150 mg  by mouth daily.   cholecalciferol 25 MCG (1000 UNIT) tablet Commonly known as: VITAMIN D Take 1,000 Units by mouth daily.   diclofenac sodium 1 % Gel Commonly known as: VOLTAREN Apply 2 g topically 4 (four) times daily as needed (knee pain).   docusate sodium 100 MG capsule Commonly known as: COLACE Take 1 capsule (100 mg total) by mouth 2 (two) times daily.   ibuprofen 200 MG tablet Commonly known as: ADVIL Take 400 mg by mouth every 6 (six) hours as needed (headaches/pain.).   levothyroxine 50 MCG tablet Commonly known as: SYNTHROID Take 50 mcg by mouth daily before breakfast.   oxyCODONE 5 MG immediate release tablet Commonly known as: Oxy IR/ROXICODONE Take 1 tablet (5 mg total) by mouth every 6 (six) hours as needed for severe pain. What changed: Another medication with the same name was added. Make sure you understand how and when to take each.   oxyCODONE 5 MG immediate release tablet Commonly known as: Oxy IR/ROXICODONE Take 1 tablet (5 mg total) by mouth every 6 (six) hours as needed for moderate pain, severe pain or breakthrough pain. What changed: You were already taking a medication with the same name, and this prescription was added. Make sure you understand how and when to take each.   oxyCODONE 5 MG immediate release tablet Commonly known as: Oxy IR/ROXICODONE Take 1 tablet (5 mg total) by mouth every 4 (four) hours as needed for moderate pain. What changed: You were already taking a medication with the same name, and  this prescription was added. Make sure you understand how and when to take each.   polyethylene glycol 17 g packet Commonly known as: MIRALAX / GLYCOLAX Take 17 g by mouth daily. What changed: when to take this   senna-docusate 8.6-50 MG tablet Commonly known as: Senokot-S Take 1 tablet by mouth daily. What changed:   how much to take  when to take this   Spiriva HandiHaler 18 MCG inhalation capsule Generic drug: tiotropium Take 18 mcg  by mouth daily.   traZODone 100 MG tablet Commonly known as: DESYREL Take 100 mg by mouth at bedtime.   valACYclovir 500 MG tablet Commonly known as: VALTREX Take 500 mg by mouth See admin instructions. Take 1 tablet by mouth twice daily for 3 days during outbreak      Follow-up Information    Rolm Bookbinder, MD In 2 weeks.   Specialty: General Surgery Contact information: [address] [place] [address] [phone]           Signed: Rolm Bookbinder 01/30/2020, 8:23 AM

## 2020-02-11 ENCOUNTER — Other Ambulatory Visit: Payer: Self-pay | Admitting: General Surgery

## 2020-02-11 DIAGNOSIS — K383 Fistula of appendix: Secondary | ICD-10-CM

## 2020-02-12 ENCOUNTER — Inpatient Hospital Stay: Admission: RE | Admit: 2020-02-12 | Payer: Medicare Other | Source: Ambulatory Visit

## 2020-02-13 ENCOUNTER — Other Ambulatory Visit: Payer: Self-pay

## 2020-02-13 ENCOUNTER — Ambulatory Visit
Admission: RE | Admit: 2020-02-13 | Discharge: 2020-02-13 | Disposition: A | Discharge status: Home / Self Care | Payer: Medicare Other | Source: Ambulatory Visit | Attending: General Surgery | Admitting: General Surgery

## 2020-02-13 DIAGNOSIS — K383 Fistula of appendix: Secondary | ICD-10-CM

## 2020-02-13 MED ORDER — IOPAMIDOL (ISOVUE-300) INJECTION 61%
100.0000 mL | Freq: Once | INTRAVENOUS | Status: AC | PRN
  Administered 2020-02-13: 100 mL via INTRAVENOUS

## 2020-05-10 ENCOUNTER — Ambulatory Visit (HOSPITAL_COMMUNITY)
Admission: RE | Admit: 2020-05-10 | Discharge: 2020-05-10 | Disposition: A | Discharge status: Home / Self Care | Payer: Medicare Other | Source: Ambulatory Visit | Attending: Orthopedic Surgery | Admitting: Orthopedic Surgery

## 2020-05-10 ENCOUNTER — Other Ambulatory Visit (HOSPITAL_COMMUNITY): Payer: Self-pay | Admitting: Orthopedic Surgery

## 2020-05-10 ENCOUNTER — Other Ambulatory Visit: Payer: Self-pay

## 2020-05-10 DIAGNOSIS — M7989 Other specified soft tissue disorders: Secondary | ICD-10-CM | POA: Insufficient documentation

## 2020-05-10 DIAGNOSIS — M79604 Pain in right leg: Secondary | ICD-10-CM | POA: Diagnosis present

## 2020-05-10 NOTE — Progress Notes (Signed)
Lower extremity venous has been completed.   Preliminary results in CV Proc.   Abram Sander 05/10/2020 2:06 PM

## 2020-06-07 DIAGNOSIS — D72829 Elevated white blood cell count, unspecified: Secondary | ICD-10-CM | POA: Insufficient documentation

## 2020-06-23 ENCOUNTER — Other Ambulatory Visit: Payer: Self-pay

## 2020-06-23 ENCOUNTER — Encounter (HOSPITAL_COMMUNITY): Payer: Self-pay | Admitting: Emergency Medicine

## 2020-06-23 ENCOUNTER — Emergency Department (HOSPITAL_COMMUNITY)
Admission: EM | Admit: 2020-06-23 | Discharge: 2020-06-24 | Disposition: A | Discharge status: Home / Self Care | Payer: Medicare Other | Attending: Emergency Medicine | Admitting: Emergency Medicine

## 2020-06-23 DIAGNOSIS — R29818 Other symptoms and signs involving the nervous system: Secondary | ICD-10-CM | POA: Diagnosis not present

## 2020-06-23 DIAGNOSIS — Z5321 Procedure and treatment not carried out due to patient leaving prior to being seen by health care provider: Secondary | ICD-10-CM | POA: Insufficient documentation

## 2020-06-23 LAB — CBC
HCT: 37.5 % (ref 36.0–46.0)
Hemoglobin: 11.8 g/dL — ABNORMAL LOW (ref 12.0–15.0)
MCH: 28.3 pg (ref 26.0–34.0)
MCHC: 31.5 g/dL (ref 30.0–36.0)
MCV: 89.9 fL (ref 80.0–100.0)
Platelets: 310 10*3/uL (ref 150–400)
RBC: 4.17 MIL/uL (ref 3.87–5.11)
RDW: 13.4 % (ref 11.5–15.5)
WBC: 10.7 10*3/uL — ABNORMAL HIGH (ref 4.0–10.5)
nRBC: 0 % (ref 0.0–0.2)

## 2020-06-23 LAB — BASIC METABOLIC PANEL
Anion gap: 9 (ref 5–15)
BUN: 13 mg/dL (ref 8–23)
CO2: 26 mmol/L (ref 22–32)
Calcium: 9.5 mg/dL (ref 8.9–10.3)
Chloride: 104 mmol/L (ref 98–111)
Creatinine, Ser: 1 mg/dL (ref 0.44–1.00)
GFR calc Af Amer: >60 mL/min (ref >60)
GFR calc non Af Amer: 57 mL/min — ABNORMAL LOW (ref >60)
Glucose, Bld: 92 mg/dL (ref 70–99)
Potassium: 4.2 mmol/L (ref 3.5–5.1)
Sodium: 139 mmol/L (ref 135–145)

## 2020-06-23 MED ORDER — SODIUM CHLORIDE 0.9% FLUSH: 3.0000 mL | Freq: Once | INTRAVENOUS | Status: DC

## 2020-06-23 NOTE — ED Triage Notes (Signed)
Pt c/o left leg numbness x 2 days, and right leg numbness that started yesterday. Hx lumbar fusion, denies back pain, or changes to bowel/bladder. Pt ambulatory without difficulty.

## 2020-06-23 NOTE — ED Notes (Signed)
Called pt to be triaged and had no answer.

## 2020-06-24 NOTE — ED Notes (Signed)
Pt left AMA. Advised to return if symptoms return.

## 2020-09-28 ENCOUNTER — Telehealth: Payer: Self-pay | Admitting: Infectious Diseases

## 2020-09-28 NOTE — Telephone Encounter (Signed)
Called to Discuss with patient about Covid symptoms and the use of the monoclonal antibody infusion for those with mild to moderate Covid symptoms and at a high risk of hospitalization.     Pt appears to qualify for this infusion due to co-morbid conditions and/or a member of an at-risk group in accordance with the FDA Emergency Use Authorization.    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 I left a generic VM attempting to reach Jean Shaffer and asked for a call back to [phone].   Sx started 9/30 when she was tested at [place], MSN, NP-C [facility] for Infectious Disease [email or web address]

## 2020-09-30 ENCOUNTER — Inpatient Hospital Stay (HOSPITAL_COMMUNITY)
Admission: EM | Admit: 2020-09-30 | Discharge: 2020-10-25 | DRG: 177 | Disposition: A | Discharge status: Rehabilitation Facility | Payer: Medicare Other | Attending: Internal Medicine | Admitting: Internal Medicine

## 2020-09-30 ENCOUNTER — Encounter (HOSPITAL_COMMUNITY): Payer: Self-pay

## 2020-09-30 ENCOUNTER — Other Ambulatory Visit: Payer: Self-pay

## 2020-09-30 ENCOUNTER — Emergency Department (HOSPITAL_COMMUNITY): Payer: Medicare Other

## 2020-09-30 ENCOUNTER — Other Ambulatory Visit (HOSPITAL_COMMUNITY): Payer: Self-pay | Admitting: Adult Health

## 2020-09-30 ENCOUNTER — Ambulatory Visit (HOSPITAL_COMMUNITY)
Admission: RE | Admit: 2020-09-30 | Discharge: 2020-09-30 | Disposition: A | Discharge status: Home / Self Care | Payer: Medicare Other | Source: Ambulatory Visit | Attending: Pulmonary Disease | Admitting: Pulmonary Disease

## 2020-09-30 VITALS — BP 104/55 | HR 73 | Temp 98.8°F | Resp 16

## 2020-09-30 DIAGNOSIS — Z79890 Hormone replacement therapy (postmenopausal): Secondary | ICD-10-CM | POA: Diagnosis not present

## 2020-09-30 DIAGNOSIS — K5900 Constipation, unspecified: Secondary | ICD-10-CM | POA: Diagnosis not present

## 2020-09-30 DIAGNOSIS — E038 Other specified hypothyroidism: Secondary | ICD-10-CM | POA: Diagnosis not present

## 2020-09-30 DIAGNOSIS — R739 Hyperglycemia, unspecified: Secondary | ICD-10-CM | POA: Diagnosis not present

## 2020-09-30 DIAGNOSIS — R5381 Other malaise: Secondary | ICD-10-CM | POA: Diagnosis present

## 2020-09-30 DIAGNOSIS — R0602 Shortness of breath: Secondary | ICD-10-CM | POA: Diagnosis not present

## 2020-09-30 DIAGNOSIS — J1282 Pneumonia due to coronavirus disease 2019: Secondary | ICD-10-CM | POA: Diagnosis present

## 2020-09-30 DIAGNOSIS — Z7951 Long term (current) use of inhaled steroids: Secondary | ICD-10-CM | POA: Diagnosis not present

## 2020-09-30 DIAGNOSIS — I10 Essential (primary) hypertension: Secondary | ICD-10-CM | POA: Diagnosis present

## 2020-09-30 DIAGNOSIS — M549 Dorsalgia, unspecified: Secondary | ICD-10-CM | POA: Diagnosis present

## 2020-09-30 DIAGNOSIS — K219 Gastro-esophageal reflux disease without esophagitis: Secondary | ICD-10-CM | POA: Diagnosis not present

## 2020-09-30 DIAGNOSIS — D72818 Other decreased white blood cell count: Secondary | ICD-10-CM | POA: Diagnosis present

## 2020-09-30 DIAGNOSIS — G8929 Other chronic pain: Secondary | ICD-10-CM | POA: Diagnosis present

## 2020-09-30 DIAGNOSIS — E039 Hypothyroidism, unspecified: Secondary | ICD-10-CM | POA: Diagnosis present

## 2020-09-30 DIAGNOSIS — Z79899 Other long term (current) drug therapy: Secondary | ICD-10-CM | POA: Diagnosis not present

## 2020-09-30 DIAGNOSIS — T380X5A Adverse effect of glucocorticoids and synthetic analogues, initial encounter: Secondary | ICD-10-CM | POA: Diagnosis not present

## 2020-09-30 DIAGNOSIS — Z7982 Long term (current) use of aspirin: Secondary | ICD-10-CM

## 2020-09-30 DIAGNOSIS — Z90710 Acquired absence of both cervix and uterus: Secondary | ICD-10-CM | POA: Diagnosis not present

## 2020-09-30 DIAGNOSIS — U071 COVID-19: Principal | ICD-10-CM | POA: Diagnosis present

## 2020-09-30 DIAGNOSIS — J9601 Acute respiratory failure with hypoxia: Secondary | ICD-10-CM

## 2020-09-30 DIAGNOSIS — F32A Depression, unspecified: Secondary | ICD-10-CM | POA: Diagnosis present

## 2020-09-30 DIAGNOSIS — G4700 Insomnia, unspecified: Secondary | ICD-10-CM | POA: Diagnosis not present

## 2020-09-30 DIAGNOSIS — R0902 Hypoxemia: Secondary | ICD-10-CM | POA: Insufficient documentation

## 2020-09-30 DIAGNOSIS — Z87891 Personal history of nicotine dependence: Secondary | ICD-10-CM

## 2020-09-30 DIAGNOSIS — D708 Other neutropenia: Secondary | ICD-10-CM | POA: Diagnosis present

## 2020-09-30 DIAGNOSIS — J440 Chronic obstructive pulmonary disease with acute lower respiratory infection: Secondary | ICD-10-CM | POA: Diagnosis present

## 2020-09-30 DIAGNOSIS — Z8673 Personal history of transient ischemic attack (TIA), and cerebral infarction without residual deficits: Secondary | ICD-10-CM

## 2020-09-30 DIAGNOSIS — J441 Chronic obstructive pulmonary disease with (acute) exacerbation: Secondary | ICD-10-CM | POA: Diagnosis present

## 2020-09-30 DIAGNOSIS — R7989 Other specified abnormal findings of blood chemistry: Secondary | ICD-10-CM | POA: Diagnosis not present

## 2020-09-30 LAB — CBC WITH DIFFERENTIAL/PLATELET
Abs Immature Granulocytes: 0.02 10*3/uL (ref 0.00–0.07)
Basophils Absolute: 0 10*3/uL (ref 0.0–0.1)
Basophils Relative: 0 %
Eosinophils Absolute: 0 10*3/uL (ref 0.0–0.5)
Eosinophils Relative: 0 %
HCT: 37.3 % (ref 36.0–46.0)
Hemoglobin: 12.4 g/dL (ref 12.0–15.0)
Immature Granulocytes: 1 %
Lymphocytes Relative: 14 %
Lymphs Abs: 0.4 10*3/uL — ABNORMAL LOW (ref 0.7–4.0)
MCH: 28.6 pg (ref 26.0–34.0)
MCHC: 33.2 g/dL (ref 30.0–36.0)
MCV: 85.9 fL (ref 80.0–100.0)
Monocytes Absolute: 0.3 10*3/uL (ref 0.1–1.0)
Monocytes Relative: 9 %
Neutro Abs: 2.3 10*3/uL (ref 1.7–7.7)
Neutrophils Relative %: 76 %
Platelets: 146 10*3/uL — ABNORMAL LOW (ref 150–400)
RBC: 4.34 MIL/uL (ref 3.87–5.11)
RDW: 13.5 % (ref 11.5–15.5)
WBC: 3 10*3/uL — ABNORMAL LOW (ref 4.0–10.5)
nRBC: 0 % (ref 0.0–0.2)

## 2020-09-30 LAB — COMPREHENSIVE METABOLIC PANEL
ALT: 60 U/L — ABNORMAL HIGH (ref 0–44)
AST: 111 U/L — ABNORMAL HIGH (ref 15–41)
Albumin: 3.4 g/dL — ABNORMAL LOW (ref 3.5–5.0)
Alkaline Phosphatase: 104 U/L (ref 38–126)
Anion gap: 14 (ref 5–15)
BUN: 12 mg/dL (ref 8–23)
CO2: 26 mmol/L (ref 22–32)
Calcium: 8.7 mg/dL — ABNORMAL LOW (ref 8.9–10.3)
Chloride: 96 mmol/L — ABNORMAL LOW (ref 98–111)
Creatinine, Ser: 0.91 mg/dL (ref 0.44–1.00)
GFR calc non Af Amer: >60 mL/min (ref >60)
Glucose, Bld: 109 mg/dL — ABNORMAL HIGH (ref 70–99)
Potassium: 3.4 mmol/L — ABNORMAL LOW (ref 3.5–5.1)
Sodium: 136 mmol/L (ref 135–145)
Total Bilirubin: 0.6 mg/dL (ref 0.3–1.2)
Total Protein: 7.3 g/dL (ref 6.5–8.1)

## 2020-09-30 LAB — FIBRINOGEN: Fibrinogen: 353 mg/dL (ref 210–475)

## 2020-09-30 LAB — PROCALCITONIN: Procalcitonin: <0.1 ng/mL

## 2020-09-30 LAB — TRIGLYCERIDES: Triglycerides: 121 mg/dL (ref <150)

## 2020-09-30 LAB — C-REACTIVE PROTEIN: CRP: 3.5 mg/dL — ABNORMAL HIGH (ref <1.0)

## 2020-09-30 LAB — LACTIC ACID, PLASMA: Lactic Acid, Venous: 1.2 mmol/L (ref 0.5–1.9)

## 2020-09-30 LAB — FERRITIN: Ferritin: 863 ng/mL — ABNORMAL HIGH (ref 11–307)

## 2020-09-30 LAB — LACTATE DEHYDROGENASE: LDH: 445 U/L — ABNORMAL HIGH (ref 98–192)

## 2020-09-30 LAB — D-DIMER, QUANTITATIVE: D-Dimer, Quant: 1.03 ug/mL-FEU — ABNORMAL HIGH (ref 0.00–0.50)

## 2020-09-30 LAB — HIV ANTIBODY (ROUTINE TESTING W REFLEX): HIV Screen 4th Generation wRfx: NONREACTIVE

## 2020-09-30 MED ORDER — ZINC SULFATE 220 (50 ZN) MG PO CAPS
220.0000 mg | ORAL_CAPSULE | Freq: Every day | ORAL | Status: DC
  Administered 2020-09-30 – 2020-10-25 (×26): 220 mg via ORAL
  Filled 2020-09-30 (×26): qty 1

## 2020-09-30 MED ORDER — SODIUM CHLORIDE 0.9 % IV SOLN: 100.0000 mg | Freq: Every day | INTRAVENOUS | Status: DC

## 2020-09-30 MED ORDER — SODIUM CHLORIDE 0.9 % IV SOLN: 200.0000 mg | Freq: Once | INTRAVENOUS | Status: DC

## 2020-09-30 MED ORDER — DEXAMETHASONE SODIUM PHOSPHATE 10 MG/ML IJ SOLN
6.0000 mg | INTRAMUSCULAR | Status: DC
  Administered 2020-09-30: 6 mg via INTRAVENOUS
  Filled 2020-09-30: qty 1

## 2020-09-30 MED ORDER — ONDANSETRON HCL 4 MG PO TABS: 4.0000 mg | ORAL_TABLET | Freq: Four times a day (QID) | ORAL | Status: DC | PRN

## 2020-09-30 MED ORDER — GUAIFENESIN-DM 100-10 MG/5ML PO SYRP
10.0000 mL | ORAL_SOLUTION | ORAL | Status: DC | PRN
  Administered 2020-10-01 – 2020-10-11 (×11): 10 mL via ORAL
  Filled 2020-09-30 (×11): qty 10

## 2020-09-30 MED ORDER — ENOXAPARIN SODIUM 40 MG/0.4ML ~~LOC~~ SOLN
40.0000 mg | SUBCUTANEOUS | Status: DC
  Administered 2020-09-30 – 2020-10-24 (×25): 40 mg via SUBCUTANEOUS
  Filled 2020-09-30 (×26): qty 0.4

## 2020-09-30 MED ORDER — POLYETHYLENE GLYCOL 3350 17 G PO PACK
17.0000 g | PACK | Freq: Every day | ORAL | Status: DC
  Administered 2020-10-01 – 2020-10-24 (×12): 17 g via ORAL
  Filled 2020-09-30 (×21): qty 1

## 2020-09-30 MED ORDER — ONDANSETRON HCL 4 MG/2ML IJ SOLN: 4.0000 mg | Freq: Four times a day (QID) | INTRAMUSCULAR | Status: DC | PRN

## 2020-09-30 MED ORDER — TIOTROPIUM BROMIDE MONOHYDRATE 18 MCG IN CAPS: 18.0000 ug | ORAL_CAPSULE | Freq: Every day | RESPIRATORY_TRACT | Status: DC

## 2020-09-30 MED ORDER — FLUTICASONE FUROATE-VILANTEROL 100-25 MCG/INH IN AEPB
1.0000 | INHALATION_SPRAY | Freq: Every day | RESPIRATORY_TRACT | Status: DC
  Administered 2020-09-30 – 2020-10-25 (×26): 1 via RESPIRATORY_TRACT
  Filled 2020-09-30 (×2): qty 28

## 2020-09-30 MED ORDER — BUPROPION HCL ER (XL) 150 MG PO TB24
150.0000 mg | ORAL_TABLET | Freq: Every day | ORAL | Status: DC
  Administered 2020-10-01 – 2020-10-25 (×25): 150 mg via ORAL
  Filled 2020-09-30 (×26): qty 1

## 2020-09-30 MED ORDER — HYDROCOD POLST-CPM POLST ER 10-8 MG/5ML PO SUER
5.0000 mL | Freq: Two times a day (BID) | ORAL | Status: DC | PRN
  Administered 2020-10-05 – 2020-10-06 (×2): 5 mL via ORAL
  Filled 2020-09-30 (×2): qty 5

## 2020-09-30 MED ORDER — DOCUSATE SODIUM 100 MG PO CAPS
100.0000 mg | ORAL_CAPSULE | Freq: Two times a day (BID) | ORAL | Status: DC
  Administered 2020-09-30 – 2020-10-25 (×48): 100 mg via ORAL
  Filled 2020-09-30 (×49): qty 1

## 2020-09-30 MED ORDER — HYDROCODONE-ACETAMINOPHEN 5-325 MG PO TABS
1.0000 | ORAL_TABLET | Freq: Every day | ORAL | Status: DC | PRN
  Administered 2020-10-06 – 2020-10-07 (×2): 1 via ORAL
  Filled 2020-09-30 (×2): qty 1

## 2020-09-30 MED ORDER — SODIUM CHLORIDE 0.9 % IV SOLN
100.0000 mg | Freq: Every day | INTRAVENOUS | Status: AC
  Administered 2020-10-01 – 2020-10-04 (×4): 100 mg via INTRAVENOUS
  Filled 2020-09-30 (×4): qty 20

## 2020-09-30 MED ORDER — ASPIRIN EC 81 MG PO TBEC
81.0000 mg | DELAYED_RELEASE_TABLET | Freq: Every day | ORAL | Status: DC
  Administered 2020-09-30 – 2020-10-25 (×26): 81 mg via ORAL
  Filled 2020-09-30 (×26): qty 1

## 2020-09-30 MED ORDER — SODIUM CHLORIDE 0.9 % IV SOLN: INTRAVENOUS | Status: DC

## 2020-09-30 MED ORDER — ACETAMINOPHEN 325 MG PO TABS
650.0000 mg | ORAL_TABLET | Freq: Four times a day (QID) | ORAL | Status: DC | PRN
  Administered 2020-10-10: 650 mg via ORAL
  Filled 2020-09-30: qty 2

## 2020-09-30 MED ORDER — DEXAMETHASONE SODIUM PHOSPHATE 10 MG/ML IJ SOLN: 6.0000 mg | INTRAMUSCULAR | Status: DC

## 2020-09-30 MED ORDER — LEVOTHYROXINE SODIUM 50 MCG PO TABS
50.0000 ug | ORAL_TABLET | Freq: Every day | ORAL | Status: DC
  Administered 2020-10-01 – 2020-10-25 (×25): 50 ug via ORAL
  Filled 2020-09-30 (×26): qty 1

## 2020-09-30 MED ORDER — UMECLIDINIUM BROMIDE 62.5 MCG/INH IN AEPB
1.0000 | INHALATION_SPRAY | Freq: Every day | RESPIRATORY_TRACT | Status: DC
  Administered 2020-10-01 – 2020-10-25 (×25): 1 via RESPIRATORY_TRACT
  Filled 2020-09-30 (×3): qty 7

## 2020-09-30 MED ORDER — ASCORBIC ACID 500 MG PO TABS
500.0000 mg | ORAL_TABLET | Freq: Every day | ORAL | Status: DC
  Administered 2020-09-30 – 2020-10-25 (×26): 500 mg via ORAL
  Filled 2020-09-30 (×26): qty 1

## 2020-09-30 MED ORDER — SODIUM CHLORIDE 0.9 % IV SOLN
200.0000 mg | Freq: Once | INTRAVENOUS | Status: AC
  Administered 2020-09-30: 200 mg via INTRAVENOUS
  Filled 2020-09-30: qty 40
  Filled 2020-09-30: qty 200

## 2020-09-30 NOTE — Progress Notes (Signed)
Patient arrives to room 1531 at this time via stretcher from ED.  Patient ambulatory with assist of 1 to bed.  Patient oriented to callbell use and bed controls with stated understanding

## 2020-09-30 NOTE — Progress Notes (Signed)
NS infusing at 100 cc/hr per NP order. Awaiting for ED bed

## 2020-09-30 NOTE — Progress Notes (Addendum)
Patient's initial oxygen level was 79%, placed on 2L per nasal cannula. Current  oxygen at 2L - ranges from 87- 92 %. NP on site to evaluate pt.

## 2020-09-30 NOTE — Progress Notes (Signed)
Flutter valve sent to 5w. Floor notified

## 2020-09-30 NOTE — ED Provider Notes (Addendum)
[facility] DEPT Provider Note   CSN: [id number] Arrival date & time: 09/30/20  1504     History Chief Complaint  Patient presents with  . Shortness of Breath    Jean Shaffer is a 69 y.o. female.  The history is provided by the patient.  Shortness of Breath Severity:  Moderate Onset quality:  Gradual Duration:  2 days Timing:  Intermittent Progression:  Waxing and waning Chronicity:  New Context: activity and URI (covid positive)   Relieved by:  Nothing Worsened by:  Exertion and coughing Associated symptoms: cough   Associated symptoms: no abdominal pain, no chest pain, no ear pain, no fever, no rash, no sore throat and no vomiting        Past Medical History:  Diagnosis Date  . Anemia   . 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 Asthma   . Chronic back pain   . COPD (chronic obstructive pulmonary disease) ([place])    per pt dx in ~2014  . Heart murmur    as a child per pt  . Hypertension   . Hypothyroidism   . Stroke [facility])    per pt "maybe last year or the year before last, they told me I might've had a TIA, but I blacked out"  . Thyroid disease     Patient Active Problem List   Diagnosis Date Noted  . S/P laparoscopic appendectomy 01/29/2020  . Perforated appendicitis 12/27/2019  . Palpitations 02/05/2019  . Syncope 12/07/2018  . HTN (hypertension) 12/07/2018  . COPD (chronic obstructive pulmonary disease) ([place]) 12/07/2018    Past Surgical History:  Procedure Laterality Date  . ABDOMINAL HYSTERECTOMY    . CATARACT EXTRACTION W/ INTRAOCULAR LENS IMPLANT Left 07/14/2014  . CATARACT EXTRACTION W/ INTRAOCULAR LENS IMPLANT Right 10/18/2015  . COLONOSCOPY    . EYE SURGERY    . IR RADIOLOGIST EVAL & MGMT  01/08/2020  . LAPAROSCOPIC APPENDECTOMY N/A 01/29/2020   Procedure: LAPAROSCOPIC  APPENDECTOMY;  Surgeon: Rolm Bookbinder, MD;  Location: [facility];  Service: General;  Laterality: N/A;  . MULTIPLE TOOTH  EXTRACTIONS  2002  . NECK SURGERY    . SPINE SURGERY    . WISDOM TOOTH EXTRACTION     per pt 01/21/20     OB History   No obstetric history on file.     Family History  Problem Relation Age of Onset  . Alcohol abuse Father   . Cancer Father   . Heart disease Father   . Cancer Mother   . Cancer Maternal Grandfather   . Heart disease Maternal Grandfather   . Heart disease Paternal Grandmother   . Diabetes Paternal Grandmother   . Diabetes Paternal Grandfather     Social History   Tobacco Use  . Smoking status: Former Research scientist (life sciences)  . Smokeless tobacco: Never Used  Vaping Use  . Vaping Use: Never used  Substance Use Topics  . Alcohol use: No  . Drug use: No    Home Medications Prior to Admission medications   Medication Sig Start Date End Date Taking? Authorizing Provider  acetaminophen (TYLENOL) 325 MG tablet Take 2 tablets (650 mg total) by mouth every 6 (six) hours as needed for mild pain (or temp > 100). Patient not taking: Reported on 01/15/2020 12/30/19   Margie Billet A, PA-C  albuterol (PROVENTIL HFA;VENTOLIN HFA) 108 (90 Base) MCG/ACT inhaler Inhale 2 puffs into the lungs every 4 (four) hours as needed for shortness  of breath or wheezing. 07/30/15   [provider]  amLODipine (NORVASC) 2.5 MG tablet Take 2.5 mg by mouth daily.    [provider]  aspirin EC 81 MG tablet Take 81 mg by mouth daily.    [provider]  buPROPion (WELLBUTRIN XL) 150 MG 24 hr tablet Take 150 mg by mouth daily.  09/11/18   [provider]  cholecalciferol (VITAMIN D) 25 MCG (1000 UT) tablet Take 1,000 Units by mouth daily.    [provider]  diclofenac sodium (VOLTAREN) 1 % GEL Apply 2 g topically 4 (four) times daily as needed (knee pain).  07/30/15   [provider]  docusate sodium (COLACE) 100 MG capsule Take 1 capsule (100 mg total) by mouth 2 (two) times daily. Patient not taking: Reported on 01/15/2020 12/30/19   Margie Billet A, PA-C    fluticasone furoate-vilanterol (BREO ELLIPTA) 100-25 MCG/INH AEPB Inhale 1 puff into the lungs daily with lunch.    [provider]  ibuprofen (ADVIL,MOTRIN) 200 MG tablet Take 400 mg by mouth every 6 (six) hours as needed (headaches/pain.).     [provider]  levothyroxine (SYNTHROID, LEVOTHROID) 50 MCG tablet Take 50 mcg by mouth daily before breakfast.  09/06/18   [provider]  oxyCODONE (OXY IR/ROXICODONE) 5 MG immediate release tablet Take 1 tablet (5 mg total) by mouth every 6 (six) hours as needed for severe pain. 12/30/19   Meuth, Brooke A, PA-C  oxyCODONE (OXY IR/ROXICODONE) 5 MG immediate release tablet Take 1 tablet (5 mg total) by mouth every 6 (six) hours as needed for moderate pain, severe pain or breakthrough pain. 01/29/20   Rolm Bookbinder, MD  oxyCODONE (OXY IR/ROXICODONE) 5 MG immediate release tablet Take 1 tablet (5 mg total) by mouth every 4 (four) hours as needed for moderate pain. 01/30/20   Rolm Bookbinder, MD  polyethylene glycol (MIRALAX / GLYCOLAX) 17 g packet Take 17 g by mouth daily. Patient taking differently: Take 17 g by mouth at bedtime.  12/30/19   Meuth, Brooke A, PA-C  senna-docusate (SENOKOT-S) 8.6-50 MG tablet Take 1 tablet by mouth daily. Patient taking differently: Take 2 tablets by mouth at bedtime.  12/31/19   Meuth, Brooke A, PA-C  tiotropium (SPIRIVA HANDIHALER) 18 MCG inhalation capsule Take 18 mcg by mouth daily.  10/08/18   [provider]  traZODone (DESYREL) 100 MG tablet Take 100 mg by mouth at bedtime. 03/01/18   [provider]  valACYclovir (VALTREX) 500 MG tablet Take 500 mg by mouth See admin instructions. Take 1 tablet by mouth twice daily for 3 days during outbreak 06/10/18   [provider]    Allergies    Patient has no known allergies.  Review of Systems   Review of Systems  Constitutional: Negative for chills and fever.  HENT: Negative for ear pain and sore throat.   Eyes: Negative  for pain and visual disturbance.  Respiratory: Positive for cough and shortness of breath.   Cardiovascular: Negative for chest pain and palpitations.  Gastrointestinal: Negative for abdominal pain and vomiting.  Genitourinary: Negative for dysuria and hematuria.  Musculoskeletal: Negative for arthralgias and back pain.  Skin: Negative for color change and rash.  Neurological: Negative for seizures and syncope.  All other systems reviewed and are negative.   Physical Exam Updated Vital Signs Ht 5\' 4"  (1.626 m)   Wt 64.9 kg   SpO2 100%   BMI 24.56 kg/m   Physical Exam Vitals and nursing  note reviewed.  Constitutional:      General: She is not in acute distress.    Appearance: She is well-developed. She is ill-appearing.  HENT:     Head: Normocephalic and atraumatic.  Eyes:     Conjunctiva/sclera: Conjunctivae normal.  Cardiovascular:     Rate and Rhythm: Normal rate and regular rhythm.     Pulses: Normal pulses.     Heart sounds: No murmur heard.   Pulmonary:     Effort: Pulmonary effort is normal. No respiratory distress.     Breath sounds: Decreased breath sounds present. No wheezing.  Abdominal:     Palpations: Abdomen is soft.     Tenderness: There is no abdominal tenderness.  Musculoskeletal:     Cervical back: Neck supple.     Right lower leg: No edema.     Left lower leg: No edema.  Skin:    General: Skin is warm and dry.     Capillary Refill: Capillary refill takes less than 2 seconds.  Neurological:     General: No focal deficit present.     Mental Status: She is alert.  Psychiatric:        Mood and Affect: Mood normal.     ED Results / Procedures / Treatments   Labs (all labs ordered are listed, but only abnormal results are displayed) Labs Reviewed  CBC WITH DIFFERENTIAL/PLATELET - Abnormal; Notable for the following components:      Result Value   WBC 3.0 (*)    Platelets 146 (*)    Lymphs Abs 0.4 (*)    All other components within normal  limits  COMPREHENSIVE METABOLIC PANEL - Abnormal; Notable for the following components:   Potassium 3.4 (*)    Chloride 96 (*)    Glucose, Bld 109 (*)    Calcium 8.7 (*)    Albumin 3.4 (*)    AST 111 (*)    ALT 60 (*)    All other components within normal limits  D-DIMER, QUANTITATIVE (NOT AT [facility]) - Abnormal; Notable for the following components:   D-Dimer, Quant 1.03 (*)    All other components within normal limits  LACTATE DEHYDROGENASE - Abnormal; Notable for the following components:   LDH 445 (*)    All other components within normal limits  CULTURE, BLOOD (ROUTINE X 2)  CULTURE, BLOOD (ROUTINE X 2)  LACTIC ACID, PLASMA  TRIGLYCERIDES  FIBRINOGEN  LACTIC ACID, PLASMA  PROCALCITONIN  FERRITIN  C-REACTIVE PROTEIN  HIV ANTIBODY (ROUTINE TESTING W REFLEX)    EKG EKG Interpretation  Date/Time:  Thursday September 30 2020 15:50:30 EDT Ventricular Rate:  80 PR Interval:    QRS Duration: 98 QT Interval:  386 QTC Calculation: 446 R Axis:   71 Text Interpretation: Sinus rhythm Confirmed by Lennice Sites [phone]) on 09/30/2020 4:12:00 PM   Radiology DG Chest Port 1 View  Result Date: 09/30/2020 CLINICAL DATA:  Shortness of breath, cough, COVID positive EXAM: PORTABLE CHEST 1 VIEW COMPARISON:  2019 FINDINGS: There are patchy bilateral pulmonary opacities, left greater than right with basilar predominance. No pleural effusion or pneumothorax. Heart size is normal. Partially imaged lower cervical anterior fusion. IMPRESSION: Patchy bilateral opacities likely reflecting COVID-19 pneumonia. Electronically Signed   By: Macy Mis M.D.   On: 09/30/2020 16:34    Procedures .Critical Care Performed by: Lennice Sites, DO Authorized by: Lennice Sites, DO   Critical care provider statement:    Critical care time (minutes):  35   Critical care was  necessary to treat or prevent imminent or life-threatening deterioration of the following conditions:  Respiratory failure    Critical care was time spent personally by me on the following activities:  Blood draw for specimens, development of treatment plan with patient or surrogate, discussions with consultants, evaluation of patient's response to treatment, examination of patient, obtaining history from patient or surrogate, ordering and performing treatments and interventions, ordering and review of laboratory studies, ordering and review of radiographic studies, pulse oximetry, re-evaluation of patient's condition and review of old charts   I assumed direction of critical care for this patient from another provider in my specialty: no     (including critical care time)  Medications Ordered in ED Medications  dexamethasone (DECADRON) injection 6 mg (6 mg Intravenous Given 09/30/20 1638)  remdesivir 200 mg in sodium chloride 0.9% 250 mL IVPB (has no administration in time range)    Followed by  remdesivir 100 mg in sodium chloride 0.9 % 100 mL IVPB (has no administration in time range)    ED Course  I have reviewed the triage vital signs and the nursing notes.  Pertinent labs & imaging results that were available during my care of the patient were reviewed by me and considered in my medical decision making (see chart for details).    MDM Rules/Calculators/A&P                          Jean Shaffer is a 69 year old female with history of hypertension, COPD who presents to the ED with shortness of breath, hypoxia from Covid clinic.  Patient on 2 L of oxygen.  Was hypoxic in the 63s while going for antibody infusion treatment.  Has felt more shortness of breath with exertion over the last 2 days with cough.  Has diminished breath sounds throughout but no obvious wheezing.  Patient with likely Covid pneumonia.  Will give IV remdesivir and IV Decadron.  Chest x-ray consistent with Covid pneumonia.  Inflammatory markers elevated but otherwise no significant anemia, electrolyte abnormality.  Patient to be admitted for  hypoxic respiratory failure from Covid pneumonia.  Hemodynamically stable throughout my care.  On 2 L of oxygen.  This chart was dictated using voice recognition software.  Despite best efforts to proofread,  errors can occur which can change the documentation meaning.  Jean Shaffer was evaluated in Emergency Department on 09/30/2020 for the symptoms described in the history of present illness. She was evaluated in the context of the global COVID-19 pandemic, which necessitated consideration that the patient might be at risk for infection with the SARS-CoV-2 virus that causes COVID-19. Institutional protocols and algorithms that pertain to the evaluation of patients at risk for COVID-19 are in a state of rapid change based on information released by regulatory bodies including the CDC and federal and state organizations. These policies and algorithms were followed during the patient's care in the ED.   Final Clinical Impression(s) / ED Diagnoses Final diagnoses:  Acute respiratory failure with hypoxia [facility])  COVID-19    Rx / DC Orders ED Discharge Orders    None       Lennice Sites, DO 09/30/20 [place], [place], DO 09/30/20 1651

## 2020-09-30 NOTE — Progress Notes (Signed)
Pt transferred to ED. Report given to [facility] ED RN.

## 2020-09-30 NOTE — H&P (Signed)
[facility] History and Physical   Patient: Jean Shaffer IEP:[id number]   PCP: [name], MD DOB: 07-12-1951   DOA: 09/30/2020   DOS: 09/30/2020   DOS: the patient was seen and examined on 09/30/2020  Patient coming from: The patient is coming from Home  Chief Complaint: Shortness of breath  HPI: Jean Shaffer is a 69 y.o. female with Past medical history of asthma and COPD, chronic back pain, HTN, hypothyroidism. Patient presented with complaints of cough and shortness of breath.  Patient started with symptoms on September 23, 2020.  Tested at [place] facility.  Presented today for monoclonal antibody infusion but was found hypoxic and therefore was transferred to ER for further work-up. She reports that initial diagnosis and initial symptoms were having headache and ongoing fever. She also reported having fever this morning on the day of admission. Currently denies having any complaints of chest pain but off-and-on has chest tightness for last 3 days. No vomiting but nausea. No diarrhea no constipation. No abdominal pain. Poor p.o. intake with anorexia ongoing for last 2 3 days. No change in the medication. Did not receive vaccine. No outpatient therapy received as well.  ED Course: 79% on room air.  Doing well on 2 L of oxygen.  Started on remdesivir and Decadron and was referred for admission.  Review of Systems: as mentioned in the history of present illness.  All other systems reviewed and are negative.  Past Medical History:  Diagnosis Date  . Anemia   . 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 Asthma   . Chronic back pain   . COPD (chronic obstructive pulmonary disease) ([place])    per pt dx in ~2014  . Heart murmur    as a child per pt  . Hypertension   . Hypothyroidism   . Stroke [facility])    per pt "maybe last year or the year before last, they told me I might've had a TIA, but I blacked out"  . Thyroid disease    Past Surgical  History:  Procedure Laterality Date  . ABDOMINAL HYSTERECTOMY    . CATARACT EXTRACTION W/ INTRAOCULAR LENS IMPLANT Left 07/14/2014  . CATARACT EXTRACTION W/ INTRAOCULAR LENS IMPLANT Right 10/18/2015  . COLONOSCOPY    . EYE SURGERY    . IR RADIOLOGIST EVAL & MGMT  01/08/2020  . LAPAROSCOPIC APPENDECTOMY N/A 01/29/2020   Procedure: LAPAROSCOPIC  APPENDECTOMY;  Surgeon: Rolm Bookbinder, MD;  Location: [facility];  Service: General;  Laterality: N/A;  . MULTIPLE TOOTH EXTRACTIONS  2002  . NECK SURGERY    . SPINE SURGERY    . WISDOM TOOTH EXTRACTION     per pt 01/21/20   Social History:  reports that she has quit smoking. She has never used smokeless tobacco. She reports that she does not drink alcohol and does not use drugs.  No Known Allergies  Family history reviewed and not pertinent Family History  Problem Relation Age of Onset  . Alcohol abuse Father   . Cancer Father   . Heart disease Father   . Cancer Mother   . Cancer Maternal Grandfather   . Heart disease Maternal Grandfather   . Heart disease Paternal Grandmother   . Diabetes Paternal Grandmother   . Diabetes Paternal Grandfather      Prior to Admission medications   Medication Sig Start Date End Date Taking? Authorizing Provider  acetaminophen (TYLENOL) 325 MG tablet Take 2 tablets (650 mg  total) by mouth every 6 (six) hours as needed for mild pain (or temp > 100). 12/30/19  Yes Meuth, Brooke A, PA-C  albuterol (PROVENTIL HFA;VENTOLIN HFA) 108 (90 Base) MCG/ACT inhaler Inhale 2 puffs into the lungs every 4 (four) hours as needed for shortness of breath or wheezing. 07/30/15  Yes [provider]  amLODipine (NORVASC) 2.5 MG tablet Take 2.5 mg by mouth daily.   Yes [provider]  aspirin EC 81 MG tablet Take 81 mg by mouth daily.   Yes [provider]  buPROPion (WELLBUTRIN XL) 150 MG 24 hr tablet Take 150 mg by mouth daily.  09/11/18  Yes [provider]  docusate sodium (COLACE) 100 MG  capsule Take 1 capsule (100 mg total) by mouth 2 (two) times daily. Patient taking differently: Take 100 mg by mouth daily as needed for mild constipation.  12/30/19  Yes Meuth, Brooke A, PA-C  fluticasone furoate-vilanterol (BREO ELLIPTA) 100-25 MCG/INH AEPB Inhale 1 puff into the lungs daily.    Yes [provider]  HYDROcodone-acetaminophen (NORCO/VICODIN) 5-325 MG tablet Take 1 tablet by mouth daily as needed for moderate pain.  09/14/20  Yes [provider]  ibuprofen (ADVIL,MOTRIN) 200 MG tablet Take 400 mg by mouth every 6 (six) hours as needed for moderate pain.    Yes [provider]  levothyroxine (SYNTHROID, LEVOTHROID) 50 MCG tablet Take 50 mcg by mouth daily before breakfast.  09/06/18  Yes [provider]  meloxicam (MOBIC) 15 MG tablet Take 15 mg by mouth daily. 06/17/20  Yes [provider]  oxyCODONE (OXY IR/ROXICODONE) 5 MG immediate release tablet Take 1 tablet (5 mg total) by mouth every 6 (six) hours as needed for severe pain. 12/30/19  Yes Meuth, Brooke A, PA-C  polyethylene glycol (MIRALAX / GLYCOLAX) 17 g packet Take 17 g by mouth daily. Patient taking differently: Take 17 g by mouth daily as needed for mild constipation.  12/30/19  Yes Meuth, Brooke A, PA-C  tiotropium (SPIRIVA HANDIHALER) 18 MCG inhalation capsule Take 18 mcg by mouth daily.  10/08/18  Yes [provider]  traZODone (DESYREL) 100 MG tablet Take 100 mg by mouth at bedtime as needed for sleep.  03/01/18  Yes [provider]  valACYclovir (VALTREX) 500 MG tablet Take 500 mg by mouth See admin instructions. Take 1 tablet by mouth twice daily for 3 days during outbreak 06/10/18  Yes [provider]  oxyCODONE (OXY IR/ROXICODONE) 5 MG immediate release tablet Take 1 tablet (5 mg total) by mouth every 6 (six) hours as needed for moderate pain, severe pain or breakthrough pain. Patient not taking: Reported on 09/30/2020 01/29/20   Rolm Bookbinder, MD    oxyCODONE (OXY IR/ROXICODONE) 5 MG immediate release tablet Take 1 tablet (5 mg total) by mouth every 4 (four) hours as needed for moderate pain. Patient not taking: Reported on 09/30/2020 01/30/20   Rolm Bookbinder, MD  senna-docusate (SENOKOT-S) 8.6-50 MG tablet Take 1 tablet by mouth daily. Patient not taking: Reported on 09/30/2020 12/31/19   Wellington Hampshire, PA-C    Physical Exam: Vitals:   09/30/20 1545 09/30/20 1617 09/30/20 1714 09/30/20 1839  BP: 119/69  (!) 124/57 (!) 150/58  Pulse:   89 84  Resp:   20 20  Temp:   99.6 F (37.6 C) 99 F (37.2 C)  TempSrc:   Oral Oral  SpO2:   95% 90%  Weight:  64.9 kg    Height:  5\' 4"  (1.626 m)  General: alert and oriented to time, place, and person. Appear in moderate distress, affect appropriate Eyes: PERRL, Conjunctiva normal ENT: Oral Mucosa Clear, moist  Neck: no JVD, no Abnormal Mass Or lumps Cardiovascular: S1 and S2 Present, no Murmur, peripheral pulses symmetrical Respiratory: increased respiratory effort, Bilateral Air entry equal and Decreased, no signs of accessory muscle use, bilateral  Crackles, bilateral Occasional  wheezes Abdomen: Bowel Sound present, Soft and no tenderness, no hernia Skin: no rashes  Extremities: no Pedal edema, no calf tenderness Neurologic: without any new focal findings Gait not checked due to patient safety concerns  Data Reviewed: I have personally reviewed and interpreted labs, imaging as discussed below.  CBC: Recent Labs  Lab 09/30/20 1543  WBC 3.0*  NEUTROABS 2.3  HGB 12.4  HCT 37.3  MCV 85.9  PLT 220*   Basic Metabolic Panel: Recent Labs  Lab 09/30/20 1543  NA 136  K 3.4*  CL 96*  CO2 26  GLUCOSE 109*  BUN 12  CREATININE 0.91  CALCIUM 8.7*   GFR: Estimated Creatinine Clearance: 50.4 mL/min (by C-G formula based on SCr of 0.91 mg/dL). Liver Function Tests: Recent Labs  Lab 09/30/20 1543  AST 111*  ALT 60*  ALKPHOS 104  BILITOT 0.6  PROT 7.3  ALBUMIN 3.4*    No results for input(s): LIPASE, AMYLASE in the last 168 hours. No results for input(s): AMMONIA in the last 168 hours. Coagulation Profile: No results for input(s): INR, PROTIME in the last 168 hours. Cardiac Enzymes: No results for input(s): CKTOTAL, CKMB, CKMBINDEX, TROPONINI in the last 168 hours. BNP (last 3 results) No results for input(s): PROBNP in the last 8760 hours. HbA1C: No results for input(s): HGBA1C in the last 72 hours. CBG: No results for input(s): GLUCAP in the last 168 hours. Lipid Profile: Recent Labs    09/30/20 1543  TRIG 121   Thyroid Function Tests: No results for input(s): TSH, T4TOTAL, FREET4, T3FREE, THYROIDAB in the last 72 hours. Anemia Panel: Recent Labs    09/30/20 1543  FERRITIN 863*   Urine analysis:    Component Value Date/Time   COLORURINE YELLOW (A) 12/13/2018 1205   APPEARANCEUR CLEAR (A) 12/13/2018 1205   LABSPEC 1.014 12/13/2018 1205   PHURINE 5.0 12/13/2018 1205   GLUCOSEU NEGATIVE 12/13/2018 1205   HGBUR NEGATIVE 12/13/2018 1205   BILIRUBINUR negative 12/27/2019 1354   KETONESUR negative 12/27/2019 [place] 12/13/2018 1205   PROTEINUR =100 (A) 12/27/2019 1354   PROTEINUR NEGATIVE 12/13/2018 1205   UROBILINOGEN 0.2 12/27/2019 1354   UROBILINOGEN 4.0 (H) 11/17/2007 1732   NITRITE Negative 12/27/2019 1354   NITRITE NEGATIVE 12/13/2018 1205   LEUKOCYTESUR Large (3+) (A) 12/27/2019 1354    Radiological Exams on Admission: DG Chest Port 1 View  Result Date: 09/30/2020 CLINICAL DATA:  Shortness of breath, cough, COVID positive EXAM: PORTABLE CHEST 1 VIEW COMPARISON:  2019 FINDINGS: There are patchy bilateral pulmonary opacities, left greater than right with basilar predominance. No pleural effusion or pneumothorax. Heart size is normal. Partially imaged lower cervical anterior fusion. IMPRESSION: Patchy bilateral opacities likely reflecting COVID-19 pneumonia. Electronically Signed   By: Macy Mis M.D.    On: 09/30/2020 16:34   I reviewed all nursing notes, pharmacy notes, vitals, pertinent old records.  Assessment/Plan 1.  Acute hypoxic respiratory failure. Acute COVID-19 Viral Pneumonia CXR: hazy bilateral peripheral opacities Oxygen requirement: 2 LPM.  79% on room air on admission. CRP: 3.5 Remdesivir: Started on 09/30/2020 Steroids: Decadron 09/30/2020 Baricitinib/Actemra(off-label use):  Discussed with patient.  Currently agreeable.  Currently not indicated.  The investigational nature of this medication was discussed with the patient/HCPOA and they choose to proceed as the potential benefits are felt to outweigh risks at this time.  Antibiotics: No indication Vitamin C and Zinc: Continue DVT Prophylaxis: enoxaparin (LOVENOX) injection 40 mg Start: 09/30/20 2200 Prone positioning and incentive spirometer use recommended.  The treatment plan and use of medications and known side effects were discussed with patient/family. It was clearly explained that Complete risks and long-term side effects are unknown, however in the best clinical judgment they seem to be of some clinical benefit rather than medical risks. Patient/family agree with the treatment plan and want to receive these treatments as indicated.   2.  COPD exacerbation Continue inhalers. Continue steroids for No indication for antibiotics for now.  3.  Hypothyroidism. Continue Synthroid.  4.  Essential hypertension. Blood pressure stable. Currently holding blood pressure medications.  5.  Chronic back pain. Continue Norco. Monitor.  6.  Depression. Continue Wellbutrin.  Nutrition: Cardiac diet DVT Prophylaxis: Subcutaneous Heparin   Advance goals of care discussion: Full code   Consults: none   Family Communication: no family was present at bedside, at the time of interview.   Disposition:  From: Home Likely will need Home on discharge.   Author: Berle Mull, MD [facility] 09/30/2020 8:31 PM    To reach On-call, see care teams to locate the attending and reach out to them via [email or web address]. If 7PM-7AM, please contact night-coverage If you still have difficulty reaching the attending provider, please page the [facility] (Director on Call) for [facility] on amion for assistance.

## 2020-09-30 NOTE — Progress Notes (Signed)
Jean Shaffer is a 69 year old here today for infusion for monoclonal antibody therapy.  She arrived to our clinic and her oxygen saturation was 79% on room air.  She was placed on 2L nasal cannula.    O: BP (!) 104/55 (BP Location: Left Arm)   Pulse 73   Temp 98.8 F (37.1 C) (Oral)   Resp 16   SpO2 93%  General: appears short of breath, no other apparent distress Lungs: decreased air movement, + rales Cardiac: RRR  A. COVID 19 infection  (a) hypoxia  P.  Due to her hypoxia and new oxygen requirement she is not a candidate for monoclonal antibody therapy.  She was recommended to go to the ER for admission and stabilization of her hypoxia.    The above was reviewed with Dr. Joya Gaskins who is in agreement.  Wilber Bihari, NP

## 2020-09-30 NOTE — ED Notes (Signed)
Discontinued NS per provider

## 2020-09-30 NOTE — Progress Notes (Signed)
I connected by phone with Elmer Ramp on 09/30/2020 at 11:04 AM to discuss the potential use of a new treatment for mild to moderate COVID-19 viral infection in non-hospitalized patients.  This patient is a 69 y.o. female that meets the FDA criteria for Emergency Use Authorization of COVID monoclonal antibody casirivimab/imdevimab or bamlanivimab/eteseviamb.  Has a (+) direct SARS-CoV-2 viral test result  Has mild or moderate COVID-19   Is NOT hospitalized due to COVID-19  Is within 10 days of symptom onset  Has at least one of the high risk factor(s) for progression to severe COVID-19 and/or hospitalization as defined in EUA.  Specific high risk criteria : Older age (>/= 70 yo), Cardiovascular disease or hypertension and Chronic Lung Disease   Sx onset 10 days ago   I have spoken and communicated the following to the patient or parent/caregiver regarding COVID monoclonal antibody treatment:  1. FDA has authorized the emergency use for the treatment of mild to moderate COVID-19 in adults and pediatric patients with positive results of direct SARS-CoV-2 viral testing who are 13 years of age and older weighing at least 40 kg, and who are at high risk for progressing to severe COVID-19 and/or hospitalization.  2. The significant known and potential risks and benefits of COVID monoclonal antibody, and the extent to which such potential risks and benefits are unknown.  3. Information on available alternative treatments and the risks and benefits of those alternatives, including clinical trials.  4. Patients treated with COVID monoclonal antibody should continue to self-isolate and use infection control measures (e.g., wear mask, isolate, social distance, avoid sharing personal items, clean and disinfect "high touch" surfaces, and frequent handwashing) according to CDC guidelines.   5. The patient or parent/caregiver has the option to accept or refuse COVID monoclonal antibody  treatment.  After reviewing this information with the patient, the patient has agreed to receive one of the available covid 19 monoclonal antibodies and will be provided an appropriate fact sheet prior to infusion. Scot Dock, NP 09/30/2020 11:04 AM

## 2020-09-30 NOTE — ED Notes (Signed)
Pt coming from infusion clinic. Per infusion clinic nurse pt was 79% RA. Pt was placed on [facility] stating at 92-93%. Pt was sent here for further evaluation. Pt has NS 158ml/hr.

## 2020-10-01 DIAGNOSIS — J9601 Acute respiratory failure with hypoxia: Secondary | ICD-10-CM | POA: Diagnosis not present

## 2020-10-01 LAB — CBC WITH DIFFERENTIAL/PLATELET
Abs Immature Granulocytes: 0.01 10*3/uL (ref 0.00–0.07)
Basophils Absolute: 0 10*3/uL (ref 0.0–0.1)
Basophils Relative: 1 %
Eosinophils Absolute: 0 10*3/uL (ref 0.0–0.5)
Eosinophils Relative: 0 %
HCT: 37.5 % (ref 36.0–46.0)
Hemoglobin: 12.3 g/dL (ref 12.0–15.0)
Immature Granulocytes: 1 %
Lymphocytes Relative: 26 %
Lymphs Abs: 0.5 10*3/uL — ABNORMAL LOW (ref 0.7–4.0)
MCH: 28.2 pg (ref 26.0–34.0)
MCHC: 32.8 g/dL (ref 30.0–36.0)
MCV: 86 fL (ref 80.0–100.0)
Monocytes Absolute: 0.2 10*3/uL (ref 0.1–1.0)
Monocytes Relative: 12 %
Neutro Abs: 1.1 10*3/uL — ABNORMAL LOW (ref 1.7–7.7)
Neutrophils Relative %: 60 %
Platelets: 170 10*3/uL (ref 150–400)
RBC: 4.36 MIL/uL (ref 3.87–5.11)
RDW: 13.5 % (ref 11.5–15.5)
WBC: 1.8 10*3/uL — ABNORMAL LOW (ref 4.0–10.5)
nRBC: 0 % (ref 0.0–0.2)

## 2020-10-01 LAB — D-DIMER, QUANTITATIVE: D-Dimer, Quant: 1.18 ug/mL-FEU — ABNORMAL HIGH (ref 0.00–0.50)

## 2020-10-01 LAB — C-REACTIVE PROTEIN: CRP: 4.5 mg/dL — ABNORMAL HIGH (ref <1.0)

## 2020-10-01 MED ORDER — BARICITINIB 2 MG PO TABS: 4.0000 mg | ORAL_TABLET | Freq: Every day | ORAL | Status: DC

## 2020-10-01 MED ORDER — ADULT MULTIVITAMIN W/MINERALS CH
1.0000 | ORAL_TABLET | Freq: Every day | ORAL | Status: DC
  Administered 2020-10-01 – 2020-10-12 (×12): 1 via ORAL
  Filled 2020-10-01 (×13): qty 1

## 2020-10-01 MED ORDER — ENSURE ENLIVE PO LIQD
237.0000 mL | Freq: Two times a day (BID) | ORAL | Status: DC
  Administered 2020-10-01 – 2020-10-25 (×41): 237 mL via ORAL

## 2020-10-01 MED ORDER — METHYLPREDNISOLONE SODIUM SUCC 40 MG IJ SOLR: 40.0000 mg | Freq: Two times a day (BID) | INTRAMUSCULAR | Status: DC

## 2020-10-01 MED ORDER — METHYLPREDNISOLONE SODIUM SUCC 40 MG IJ SOLR
40.0000 mg | Freq: Two times a day (BID) | INTRAMUSCULAR | Status: DC
  Administered 2020-10-01 – 2020-10-03 (×5): 40 mg via INTRAVENOUS
  Filled 2020-10-01 (×5): qty 1

## 2020-10-01 NOTE — Progress Notes (Signed)
Initial Nutrition Assessment  INTERVENTION:   -Ensure Enlive po BID, each supplement provides 350 kcal and 20 grams of protein -Multivitamin with minerals daily  NUTRITION DIAGNOSIS:   Increased nutrient needs related to acute illness (COVID-19 infection) as evidenced by estimated needs.  GOAL:   Patient will meet greater than or equal to 90% of their needs  MONITOR:   PO intake, Supplement acceptance, Labs, Weight trends, I & O's  REASON FOR ASSESSMENT:   Malnutrition Screening Tool    ASSESSMENT:   69 y.o. female with Past medical history of asthma and COPD, chronic back pain, HTN, hypothyroidism.Patient presented with complaints of cough and shortness of breath.  Patient started with symptoms on September 23, 2020.  Tested at [place] facility.  Presented today for monoclonal antibody infusion but was found hypoxic and therefore was transferred to ER for further work-up. Admitted for COVID-19 viral pneumonia.  Patient has been positive for COVID-19 since 9/30. Nutrition impact symptoms include poor appetite and poor PO x 2-3 days PTA.  No PO documented since admission. Will order Ensure supplements given active COVID-19 infection.  Per weight records, weights have remained stable.  Medications: Vitamin C, Colace, Miralax, Zinc sulfate Labs reviewed:  Low K  NUTRITION - FOCUSED PHYSICAL EXAM:  Unable to complete  Diet Order:   Diet Order            Diet regular Room service appropriate? Yes; Fluid consistency: Thin  Diet effective now                 EDUCATION NEEDS:   No education needs have been identified at this time  Skin:  Skin Assessment: Reviewed RN Assessment  Last BM:  PTA  Height:   Ht Readings from Last 1 Encounters:  09/30/20 5\' 4"  (1.626 m)    Weight:   Wt Readings from Last 1 Encounters:  09/30/20 64.9 kg    BMI:  Body mass index is 24.56 kg/m.  Estimated Nutritional Needs:   Kcal:  1800-2000  Protein:  85-100g  Fluid:   2L/day  Clayton Bibles, MS, RD, LDN Inpatient Clinical Dietitian Contact information available via Amion

## 2020-10-01 NOTE — Evaluation (Signed)
Physical Therapy Evaluation Patient Details Name: Jean Shaffer MRN: [id number] DOB: 16-Dec-1951 Today's Date: 10/01/2020   History of Present Illness  Jean Shaffer is a 69 y.o. female with Past medical history of asthma and COPD, chronic back pain, HTN, hypothyroidism.Patient presented with complaints of cough and shortness of breath.  Patient started with symptoms on September 23, 2020.  Tested at [facility] facility.  Presented 10/7 for monoclonal antibody infusion but was found hypoxic and therefore was transferred to ER for further work-up.  Clinical Impression  The patient presents resting in bed on 5 L  , SPO2 88%. Patient ambulated x 20' x 2 into BR on 5 L with  noted SPO2 drop to 81 %. Appeared to struggle to ambulate back from BR, required more support. Required 2-3 minutes of pursed lip breaths to return to 87%. Left on 5 L at 88%. RN aware. Pt admitted with above diagnosis.  Pt currently with functional limitations due to the deficits listed below (see PT Problem List). Pt will benefit from skilled PT to increase their independence and safety with mobility to allow discharge to the venue listed below.       Follow Up Recommendations No PT follow up    Equipment Recommendations  None recommended by PT    Recommendations for Other Services OT consult     Precautions / Restrictions Precautions Precaution Comments: sats      Mobility  Bed Mobility Overal bed mobility: Needs Assistance Bed Mobility: Supine to Sit;Sit to Supine     Supine to sit: Supervision Sit to supine: Supervision   General bed mobility comments: extra time to push self upright. rest breaks frequently  Transfers Overall transfer level: Needs assistance Equipment used: 1 person hand held assist Transfers: Sit to/from Stand Sit to Stand: Min assist         General transfer comment: steady assistance from bed and toilet  Ambulation/Gait Ambulation/Gait assistance: Min assist Gait Distance  (Feet): 20 Feet (x 2) Assistive device: 1 person hand held assist Gait Pattern/deviations: Step-to pattern;Step-through pattern;Staggering left;Staggering right;Drifts right/left Gait velocity: decr   General Gait Details: gait very unsteady, required assistance for balance, more so coming from BR.  Stairs            Wheelchair Mobility    Modified Rankin (Stroke Patients Only)       Balance Overall balance assessment: Needs assistance Sitting-balance support: Feet supported;Bilateral upper extremity supported Sitting balance-Leahy Scale: Good     Standing balance support: During functional activity;No upper extremity supported Standing balance-Leahy Scale: Poor Standing balance comment: needs support                             Pertinent Vitals/Pain Pain Assessment: No/denies pain    Home Living Family/patient expects to be discharged to:: Private residence Living Arrangements: Children;Other relatives Available Help at Discharge: Family Type of Home: House Home Access: Stairs to enter   Technical brewer of Steps: 1 Home Layout: One level Home Equipment: None      Prior Function Level of Independence: Independent               Hand Dominance        Extremity/Trunk Assessment   Upper Extremity Assessment Upper Extremity Assessment: Generalized weakness    Lower Extremity Assessment Lower Extremity Assessment: Generalized weakness    Cervical / Trunk Assessment Cervical / Trunk Assessment: Normal  Communication   Communication: No difficulties  Cognition Arousal/Alertness: Awake/alert Behavior During Therapy: WFL for tasks assessed/performed;Anxious;Flat affect Overall Cognitive Status: Within Functional Limits for tasks assessed                                        General Comments      Exercises     Assessment/Plan    PT Assessment Patient needs continued PT services  PT Problem List  Decreased strength;Decreased activity tolerance;Decreased balance;Decreased mobility;Cardiopulmonary status limiting activity       PT Treatment Interventions      PT Goals (Current goals can be found in the Care Plan section)  Acute Rehab PT Goals Patient Stated Goal: to feel better PT Goal Formulation: With patient Time For Goal Achievement: 10/15/20 Potential to Achieve Goals: Good    Frequency Min 3X/week   Barriers to discharge        Co-evaluation               AM-PAC PT "6 Clicks" Mobility  Outcome Measure Help needed turning from your back to your side while in a flat bed without using bedrails?: None Help needed moving from lying on your back to sitting on the side of a flat bed without using bedrails?: None   Help needed standing up from a chair using your arms (e.g., wheelchair or bedside chair)?: A Little Help needed to walk in hospital room?: A Lot Help needed climbing 3-5 steps with a railing? : A Lot 6 Click Score: 15    End of Session Equipment Utilized During Treatment: Oxygen Activity Tolerance: Patient limited by fatigue;Treatment limited secondary to medical complications (Comment) Patient left: in bed;with call bell/phone within reach Nurse Communication: Mobility status PT Visit Diagnosis: Difficulty in walking, not elsewhere classified (R26.2)    Time: 8616-8372 PT Time Calculation (min) (ACUTE ONLY): 31 min   Charges:   PT Evaluation $PT Eval Low Complexity: 1 Low PT Treatments $Gait Training: 8-22 mins        [place] Pager [phone] Office [phone]   Claretha Cooper 10/01/2020, 5:06 PM

## 2020-10-01 NOTE — Progress Notes (Signed)
PROGRESS NOTE    Jean Shaffer  YIR:[id number] DOB: 1951-01-05 DOA: 09/30/2020 PCP: [name], MD   Chief Complaint  Patient presents with  . Shortness of Breath   Brief Narrative: Jean Shaffer is Jean Shaffer 69 y.o. female with Past medical history of asthma and COPD, chronic back pain, HTN, hypothyroidism. Patient presented with complaints of cough and shortness of breath.  Patient started with symptoms on September 23, 2020.  Tested at [facility] facility.  Presented 10/7 for monoclonal antibody infusion but was found hypoxic and therefore was transferred to ER for further work-up.  She's been admitted for COVID 19 pneumonia.   Assessment & Plan:   Active Problems:   Pneumonia due to COVID-19 virus  Acute Hypoxic Respiratory Failure 2/2 COVID 19 Pneumonia CXR with patchy bilateral opacities Currently requiring 6 L [place] Continue steroids and remdesivir.  Discussed actemra/baricitinib.  Discussed risks/benefits, off label use.  No hx TB/hepatitis.  Discussed risk infection (VTE/malignancy with baricitinib).  She's agreeable to use if needed. Low suspicion for bacterial infection, procal <0.1 Strict I/O, daily weights Prone as able, OOB, IS, flutter valve, therapy.  COVID-19 Labs  Recent Labs    09/30/20 1543 10/01/20 0418  DDIMER 1.03* 1.18*  FERRITIN 863*  --   LDH 445*  --   CRP 3.5* 4.5*    Lab Results  Component Value Date   SARSCOV2NAA NEGATIVE 01/26/2020   SARSCOV2NAA NEGATIVE 12/27/2019   Leukopenia: lymphopenia, likely 2/2 covid.  with neutropenia as well, follow ANC  COPD  Continue inhalers and steroids No wheezing suggestive of exacerbation on my exam today   Hypothyroidism Continue synthroid  Essential Hypertension BP meds on hold   Chronic Back Pain norco prn  Depression Wellbutrin  DVT prophylaxis: lovenox Code Status: full  Family Communication: she declines me calling today Disposition:   Status is: Inpatient  Remains inpatient  appropriate because:Inpatient level of care appropriate due to severity of illness   Dispo: The patient is from: Home              Anticipated d/c is to: pending              Anticipated d/c date is: > 3 days              Patient currently is not medically stable to d/c.  Consultants:   none  Procedures:   none  Antimicrobials:  Anti-infectives (From admission, onward)   Start     Dose/Rate Route Frequency Ordered Stop   10/01/20 1000  remdesivir 100 mg in sodium chloride 0.9 % 100 mL IVPB       "Followed by" Linked Group Details   100 mg 200 mL/hr over 30 Minutes Intravenous Daily 09/30/20 1633 10/05/20 0959   10/01/20 1000  remdesivir 100 mg in sodium chloride 0.9 % 100 mL IVPB  Status:  Discontinued       "Followed by" Linked Group Details   100 mg 200 mL/hr over 30 Minutes Intravenous Daily 09/30/20 1947 09/30/20 1951   09/30/20 2000  remdesivir 200 mg in sodium chloride 0.9% 250 mL IVPB  Status:  Discontinued       "Followed by" Linked Group Details   200 mg 580 mL/hr over 30 Minutes Intravenous Once 09/30/20 1947 09/30/20 1951   09/30/20 1800  remdesivir 200 mg in sodium chloride 0.9% 250 mL IVPB       "Followed by" Linked Group Details   200 mg 580 mL/hr over 30 Minutes Intravenous Once  09/30/20 1633 09/30/20 1819     Subjective: Feels weak and SOB No pain   Objective: Vitals:   10/01/20 0206 10/01/20 0235 10/01/20 0317 10/01/20 0647  BP:  128/61  132/64  Pulse:  82 80 77  Resp:  18  (!) 24  Temp:  98.7 F (37.1 C)  98.1 F (36.7 C)  TempSrc:  Other (Comment)    SpO2: (!) 87% 92% 91% 93%  Weight:      Height:        Intake/Output Summary (Last 24 hours) at 10/01/2020 1252 Last data filed at 09/30/2020 1950 Gross per 24 hour  Intake 240 ml  Output --  Net 240 ml   Filed Weights   09/30/20 1617  Weight: 64.9 kg    Examination:  General exam: Appears calm and comfortable  Respiratory system: bibasilar crackles Cardiovascular system: S1 & S2  heard, RRR.  Gastrointestinal system: Abdomen is nondistended, soft and nontender. Central nervous system: Alert and oriented. No focal neurological deficits. Extremities: no LEE Skin: No rashes, lesions or ulcers Psychiatry: Judgement and insight appear normal. Mood & affect appropriate.     Data Reviewed: I have personally reviewed following labs and imaging studies  CBC: Recent Labs  Lab 09/30/20 1543 10/01/20 0418  WBC 3.0* 1.8*  NEUTROABS 2.3 1.1*  HGB 12.4 12.3  HCT 37.3 37.5  MCV 85.9 86.0  PLT 146* 706    Basic Metabolic Panel: Recent Labs  Lab 09/30/20 1543  NA 136  K 3.4*  CL 96*  CO2 26  GLUCOSE 109*  BUN 12  CREATININE 0.91  CALCIUM 8.7*    GFR: Estimated Creatinine Clearance: 50.4 mL/min (by C-G formula based on SCr of 0.91 mg/dL).  Liver Function Tests: Recent Labs  Lab 09/30/20 1543  AST 111*  ALT 60*  ALKPHOS 104  BILITOT 0.6  PROT 7.3  ALBUMIN 3.4*    CBG: No results for input(s): GLUCAP in the last 168 hours.   Recent Results (from the past 240 hour(s))  Blood Culture (routine x 2)     Status: None (Preliminary result)   Collection Time: 09/30/20  3:43 PM   Specimen: BLOOD  Result Value Ref Range Status   Specimen Description   Final    BLOOD LEFT ANTECUBITAL Performed at [facility] [address] [place], [place] [address]    Special Requests   Final    BOTTLES DRAWN AEROBIC AND ANAEROBIC Blood Culture results may not be optimal due to an excessive volume of blood received in culture bottles Performed at [facility] [address], [place], [place] [address]    Culture   Final    NO GROWTH < 12 HOURS Performed at [facility] [address], [place], [place] [address]    Report Status PENDING  Incomplete  Blood Culture (routine x 2)     Status: None (Preliminary result)   Collection Time: 09/30/20  3:45 PM   Specimen: BLOOD  Result Value Ref Range Status   Specimen  Description   Final    BLOOD RIGHT ANTECUBITAL Performed at [facility] [address], [place], [place] [address]    Special Requests   Final    BOTTLES DRAWN AEROBIC AND ANAEROBIC Blood Culture adequate volume Performed at [facility] [address], [place],  [address]    Culture   Final    NO GROWTH < 12 HOURS Performed at [facility],  [address], [place], [place] [address]    Report Status PENDING  Incomplete         Radiology Studies: DG Chest Port 1 View  Result Date: 09/30/2020 CLINICAL DATA:  Shortness of breath, cough, COVID positive EXAM: PORTABLE CHEST 1 VIEW COMPARISON:  2019 FINDINGS: There are patchy bilateral pulmonary opacities, left greater than right with basilar predominance. No pleural effusion or pneumothorax. Heart size is normal. Partially imaged lower cervical anterior fusion. IMPRESSION: Patchy bilateral opacities likely reflecting COVID-19 pneumonia. Electronically Signed   By: Macy Mis M.D.   On: 09/30/2020 16:34        Scheduled Meds: . vitamin C  500 mg Oral Daily  . aspirin EC  81 mg Oral Daily  . buPROPion  150 mg Oral Daily  . docusate sodium  100 mg Oral BID  . enoxaparin (LOVENOX) injection  40 mg Subcutaneous Q24H  . fluticasone furoate-vilanterol  1 puff Inhalation Daily  . levothyroxine  50 mcg Oral QAC breakfast  . methylPREDNISolone (SOLU-MEDROL) injection  40 mg Intravenous Q12H  . polyethylene glycol  17 g Oral Daily  . umeclidinium bromide  1 puff Inhalation Daily  . zinc sulfate  220 mg Oral Daily   Continuous Infusions: . remdesivir 100 mg in NS 100 mL 100 mg (10/01/20 1119)     LOS: 1 day    Time spent: over 30 min    Fayrene Helper, MD [facility]   To contact the attending provider between 7A-7P or the covering provider during after hours 7P-7A, please log into the web site [email or web address] and access using universal [place] password for  that web site. If you do not have the password, please call the hospital operator.  10/01/2020, 12:52 PM

## 2020-10-02 DIAGNOSIS — J9601 Acute respiratory failure with hypoxia: Secondary | ICD-10-CM | POA: Diagnosis not present

## 2020-10-02 LAB — COMPREHENSIVE METABOLIC PANEL
ALT: 82 U/L — ABNORMAL HIGH (ref 0–44)
AST: 119 U/L — ABNORMAL HIGH (ref 15–41)
Albumin: 3.1 g/dL — ABNORMAL LOW (ref 3.5–5.0)
Alkaline Phosphatase: 114 U/L (ref 38–126)
Anion gap: 12 (ref 5–15)
BUN: 22 mg/dL (ref 8–23)
CO2: 26 mmol/L (ref 22–32)
Calcium: 9.2 mg/dL (ref 8.9–10.3)
Chloride: 99 mmol/L (ref 98–111)
Creatinine, Ser: 0.66 mg/dL (ref 0.44–1.00)
GFR, Estimated: >60 mL/min (ref >60)
Glucose, Bld: 138 mg/dL — ABNORMAL HIGH (ref 70–99)
Potassium: 4.1 mmol/L (ref 3.5–5.1)
Sodium: 137 mmol/L (ref 135–145)
Total Bilirubin: 0.5 mg/dL (ref 0.3–1.2)
Total Protein: 6.3 g/dL — ABNORMAL LOW (ref 6.5–8.1)

## 2020-10-02 LAB — HEPATITIS PANEL, ACUTE
HCV Ab: NONREACTIVE
Hep A IgM: NONREACTIVE
Hep B C IgM: NONREACTIVE
Hepatitis B Surface Ag: NONREACTIVE

## 2020-10-02 LAB — PHOSPHORUS: Phosphorus: 2.8 mg/dL (ref 2.5–4.6)

## 2020-10-02 LAB — CBC WITH DIFFERENTIAL/PLATELET
Abs Immature Granulocytes: 0.02 10*3/uL (ref 0.00–0.07)
Basophils Absolute: 0 10*3/uL (ref 0.0–0.1)
Basophils Relative: 0 %
Eosinophils Absolute: 0 10*3/uL (ref 0.0–0.5)
Eosinophils Relative: 0 %
HCT: 38 % (ref 36.0–46.0)
Hemoglobin: 12.5 g/dL (ref 12.0–15.0)
Immature Granulocytes: 0 %
Lymphocytes Relative: 13 %
Lymphs Abs: 0.8 10*3/uL (ref 0.7–4.0)
MCH: 28.7 pg (ref 26.0–34.0)
MCHC: 32.9 g/dL (ref 30.0–36.0)
MCV: 87.2 fL (ref 80.0–100.0)
Monocytes Absolute: 1 10*3/uL (ref 0.1–1.0)
Monocytes Relative: 16 %
Neutro Abs: 4.4 10*3/uL (ref 1.7–7.7)
Neutrophils Relative %: 71 %
Platelets: 225 10*3/uL (ref 150–400)
RBC: 4.36 MIL/uL (ref 3.87–5.11)
RDW: 13.4 % (ref 11.5–15.5)
WBC: 6.1 10*3/uL (ref 4.0–10.5)
nRBC: 0 % (ref 0.0–0.2)

## 2020-10-02 LAB — FERRITIN: Ferritin: 1396 ng/mL — ABNORMAL HIGH (ref 11–307)

## 2020-10-02 LAB — MAGNESIUM: Magnesium: 2.2 mg/dL (ref 1.7–2.4)

## 2020-10-02 LAB — D-DIMER, QUANTITATIVE: D-Dimer, Quant: 0.91 ug/mL-FEU — ABNORMAL HIGH (ref 0.00–0.50)

## 2020-10-02 LAB — C-REACTIVE PROTEIN: CRP: 1.5 mg/dL — ABNORMAL HIGH (ref <1.0)

## 2020-10-02 MED ORDER — BARICITINIB 2 MG PO TABS: 2.0000 mg | ORAL_TABLET | Freq: Every day | ORAL | Status: DC

## 2020-10-02 MED ORDER — BARICITINIB 2 MG PO TABS
4.0000 mg | ORAL_TABLET | Freq: Every day | ORAL | Status: AC
  Administered 2020-10-02 – 2020-10-15 (×14): 4 mg via ORAL
  Filled 2020-10-02 (×14): qty 2

## 2020-10-02 MED ORDER — BARICITINIB 2 MG PO TABS: 4.0000 mg | ORAL_TABLET | Freq: Every day | ORAL | Status: DC

## 2020-10-02 NOTE — Progress Notes (Signed)
PROGRESS NOTE    Jean Shaffer  OXB:[id number] DOB: May 13, 1951 DOA: 09/30/2020 PCP: [name], MD   Chief Complaint  Patient presents with  . Shortness of Breath   Brief Narrative: Jean Shaffer is Jean Shaffer 69 y.o. female with Past medical history of asthma and COPD, chronic back pain, HTN, hypothyroidism. Patient presented with complaints of cough and shortness of breath.  Patient started with symptoms on September 23, 2020.  Tested at [facility] facility.  Presented 10/7 for monoclonal antibody infusion but was found hypoxic and therefore was transferred to ER for further work-up.  She's been admitted for COVID 19 pneumonia.   Assessment & Plan:   Active Problems:   Pneumonia due to COVID-19 virus   Acute respiratory failure with hypoxia (HCC)  Acute Hypoxic Respiratory Failure 2/2 COVID 19 Pneumonia CXR with patchy bilateral opacities Currently requiring 5 L [place] Continue steroids and remdesivir.  Start baricitinib. Low suspicion for bacterial infection, procal <0.1 Strict I/O, daily weights Prone as able, OOB, IS, flutter valve, therapy.  COVID-19 Labs  Recent Labs    09/30/20 1543 10/01/20 0418 10/02/20 0431  DDIMER 1.03* 1.18* 0.91*  FERRITIN 863*  --  1,396*  LDH 445*  --   --   CRP 3.5* 4.5* 1.5*    Lab Results  Component Value Date   SARSCOV2NAA NEGATIVE 01/26/2020   [place] NEGATIVE 12/27/2019   Elevated LFTs: 2/2 covid, follow acute hepatitis panel  Leukopenia: improved  COPD  Continue inhalers and steroids No wheezing suggestive of exacerbation on my exam today   Hypothyroidism Continue synthroid  Essential Hypertension BP meds on hold   Chronic Back Pain norco prn  Depression Wellbutrin  DVT prophylaxis: lovenox Code Status: full  Family Communication: discussed with son Disposition:   Status is: Inpatient  Remains inpatient appropriate because:Inpatient level of care appropriate due to severity of illness   Dispo: The  patient is from: Home              Anticipated d/c is to: pending              Anticipated d/c date is: > 3 days              Patient currently is not medically stable to d/c.  Consultants:   none  Procedures:   none  Antimicrobials:  Anti-infectives (From admission, onward)   Start     Dose/Rate Route Frequency Ordered Stop   10/01/20 1000  remdesivir 100 mg in sodium chloride 0.9 % 100 mL IVPB       "Followed by" Linked Group Details   100 mg 200 mL/hr over 30 Minutes Intravenous Daily 09/30/20 1633 10/05/20 0959   10/01/20 1000  remdesivir 100 mg in sodium chloride 0.9 % 100 mL IVPB  Status:  Discontinued       "Followed by" Linked Group Details   100 mg 200 mL/hr over 30 Minutes Intravenous Daily 09/30/20 1947 09/30/20 1951   09/30/20 2000  remdesivir 200 mg in sodium chloride 0.9% 250 mL IVPB  Status:  Discontinued       "Followed by" Linked Group Details   200 mg 580 mL/hr over 30 Minutes Intravenous Once 09/30/20 1947 09/30/20 1951   09/30/20 1800  remdesivir 200 mg in sodium chloride 0.9% 250 mL IVPB       "Followed by" Linked Group Details   200 mg 580 mL/hr over 30 Minutes Intravenous Once 09/30/20 1633 09/30/20 1819     Subjective: Feels weak  and short of breath  Objective: Vitals:   10/01/20 2017 10/02/20 0451 10/02/20 1000 10/02/20 1359  BP: 128/63 (!) 101/45  (!) 126/54  Pulse: 88 71  85  Resp: (!) 21 (!) 22 17 19   Temp: 97.9 F (36.6 C) 97.6 F (36.4 C)  97.6 F (36.4 C)  TempSrc: Oral Oral  Oral  SpO2: (!) 87% (!) 88% (!) 89% (!) 89%  Weight:  64.5 kg    Height:        Intake/Output Summary (Last 24 hours) at 10/02/2020 1654 Last data filed at 10/02/2020 1106 Gross per 24 hour  Intake 540.35 ml  Output --  Net 540.35 ml   Filed Weights   09/30/20 1617 10/02/20 0451  Weight: 64.9 kg 64.5 kg    Examination:  General: No acute distress. Cardiovascular: Heart sounds show Jean Shaffer regular rate, and rhythm. Lungs: bibasilar crackles Abdomen:  Soft, nontender, nondistended Neurological: Alert and oriented 3. Moves all extremities 4. Cranial nerves II through XII grossly intact. Skin: Warm and dry. No rashes or lesions. Extremities: No clubbing or cyanosis. No edema.   Data Reviewed: I have personally reviewed following labs and imaging studies  CBC: Recent Labs  Lab 09/30/20 1543 10/01/20 0418 10/02/20 0431  WBC 3.0* 1.8* 6.1  NEUTROABS 2.3 1.1* 4.4  HGB 12.4 12.3 12.5  HCT 37.3 37.5 38.0  MCV 85.9 86.0 87.2  PLT 146* 170 884    Basic Metabolic Panel: Recent Labs  Lab 09/30/20 1543 10/02/20 0431  NA 136 137  K 3.4* 4.1  CL 96* 99  CO2 26 26  GLUCOSE 109* 138*  BUN 12 22  CREATININE 0.91 0.66  CALCIUM 8.7* 9.2  MG  --  2.2  PHOS  --  2.8    GFR: Estimated Creatinine Clearance: 57.3 mL/min (by C-G formula based on SCr of 0.66 mg/dL).  Liver Function Tests: Recent Labs  Lab 09/30/20 1543 10/02/20 0431  AST 111* 119*  ALT 60* 82*  ALKPHOS 104 114  BILITOT 0.6 0.5  PROT 7.3 6.3*  ALBUMIN 3.4* 3.1*    CBG: No results for input(s): GLUCAP in the last 168 hours.   Recent Results (from the past 240 hour(s))  Blood Culture (routine x 2)     Status: None (Preliminary result)   Collection Time: 09/30/20  3:43 PM   Specimen: BLOOD  Result Value Ref Range Status   Specimen Description   Final    BLOOD LEFT ANTECUBITAL Performed at [facility] [address] [place], [place] [address]    Special Requests   Final    BOTTLES DRAWN AEROBIC AND ANAEROBIC Blood Culture results may not be optimal due to an excessive volume of blood received in culture bottles Performed at [facility] [address] [place], [place] [address]    Culture   Final    NO GROWTH 2 DAYS Performed at [facility] [address], [place], [place] [address]    Report Status PENDING  Incomplete  Blood Culture (routine x 2)     Status: None (Preliminary result)   Collection  Time: 09/30/20  3:45 PM   Specimen: BLOOD  Result Value Ref Range Status   Specimen Description   Final    BLOOD RIGHT ANTECUBITAL Performed at [facility] [address] [place],  [address]    Special Requests   Final    BOTTLES DRAWN AEROBIC AND ANAEROBIC Blood Culture adequate volume Performed at [facility]  [facility] [address], [place], [place] [address]    Culture   Final    NO GROWTH 2 DAYS Performed at [facility] [address] [place], [place] [address]    Report Status PENDING  Incomplete         Radiology Studies: No results found.      Scheduled Meds: . vitamin C  500 mg Oral Daily  . aspirin EC  81 mg Oral Daily  . baricitinib  2 mg Oral Daily  . buPROPion  150 mg Oral Daily  . docusate sodium  100 mg Oral BID  . enoxaparin (LOVENOX) injection  40 mg Subcutaneous Q24H  . feeding supplement (ENSURE ENLIVE)  237 mL Oral BID BM  . fluticasone furoate-vilanterol  1 puff Inhalation Daily  . levothyroxine  50 mcg Oral QAC breakfast  . methylPREDNISolone (SOLU-MEDROL) injection  40 mg Intravenous Q12H  . multivitamin with minerals  1 tablet Oral Daily  . polyethylene glycol  17 g Oral Daily  . umeclidinium bromide  1 puff Inhalation Daily  . zinc sulfate  220 mg Oral Daily   Continuous Infusions: . remdesivir 100 mg in NS 100 mL 100 mg (10/02/20 1106)     LOS: 2 days    Time spent: over 30 min    Fayrene Helper, MD [facility]   To contact the attending provider between 7A-7P or the covering provider during after hours 7P-7A, please log into the web site [email or web address] and access using universal [place] password for that web site. If you do not have the password, please call the hospital operator.  10/02/2020, 4:54 PM

## 2020-10-02 NOTE — Progress Notes (Signed)
Patient had just return back to bed from sitting in the chair after working with O/T. She started to feel nauseated and vomited a very small amount. I believe it was from the coughing but I will continue to monitor.

## 2020-10-02 NOTE — Progress Notes (Signed)
Physical Therapy Treatment Patient Details Name: Jean Shaffer MRN: [id number] DOB: [birth year] Today's Date: 10/02/2020    History of Present Illness Jean Shaffer is a 69 y.o. female with Past medical history of asthma and COPD, chronic back pain, HTN, hypothyroidism. Patient presented with complaints of cough and shortness of breath.  Patient started with symptoms on September 23, 2020. Admitted to hospital with COVID pneumonia.    Jean Shaffer Comments    Jean Shaffer agreeable to working with Jean Shaffer. Increased time for all tasks. She c/o dizziness during session. At rest, O2 87% on 5L; with activity O2 79% on 5L. Cues for pursed lip/deep breathing. Jean Shaffer is at risk for falls when mobilizing. Will continue to follow and progress activity as tolerated.    Follow Up Recommendations  Home health Jean Shaffer     Equipment Recommendations  None recommended by Jean Shaffer    Recommendations for Other Services OT consult     Precautions / Restrictions Precautions Precautions: Fall Precaution Comments: monitor sats Restrictions Weight Bearing Restrictions: No    Mobility  Bed Mobility Overal bed mobility: Needs Assistance Bed Mobility: Sidelying to Sit;Sit to Sidelying   Sidelying to sit: Supervision   Sit to sidelying: Supervision General bed mobility comments: Increased time.  Transfers Overall transfer level: Needs assistance Equipment used: 1 person hand held assist Transfers: Sit to/from Stand Sit to Stand: Min guard         General transfer comment: Unsteady. Increased time. Jean Shaffer c/o some lightheadedness/dizziness.  Ambulation/Gait Ambulation/Gait assistance: Min assist Gait Distance (Feet): 10 Feet (forwards and backwards) Assistive device: None Gait Pattern/deviations: Step-through pattern;Decreased stride length     General Gait Details: Assist to steady. Jean Shaffer c/o lightheadedness/dizziness. Cues for pursed lip/deep breathing. Increased time. O2 79% on 5L.   Stairs              Wheelchair Mobility    Modified Rankin (Stroke Patients Only)       Balance Overall balance assessment: Needs assistance         Standing balance support: No upper extremity supported;During functional activity Standing balance-Leahy Scale: Poor Standing balance comment: fall risk                            Cognition Arousal/Alertness: Awake/alert Behavior During Therapy: WFL for tasks assessed/performed Overall Cognitive Status: Within Functional Limits for tasks assessed                                        Exercises      General Comments        Pertinent Vitals/Pain Pain Assessment: No/denies pain    Home Living Family/patient expects to be discharged to:: Private residence Living Arrangements: Children;Other relatives Available Help at Discharge: Family Type of Home: House Home Access: Stairs to enter   Home Layout: One level Home Equipment: Cane - single point Additional Comments: Reports usinga cane PRN secondary to "back problems"    Prior Function Level of Independence: Independent          Jean Shaffer Goals (current goals can now be found in the care plan section) Acute Rehab Jean Shaffer Goals Patient Stated Goal: to feel better Progress towards Jean Shaffer goals: Progressing toward goals    Frequency    Min 3X/week      Jean Shaffer Plan Current plan remains appropriate    Co-evaluation  AM-PAC Jean Shaffer "6 Clicks" Mobility   Outcome Measure  Help needed turning from your back to your side while in a flat bed without using bedrails?: None Help needed moving from lying on your back to sitting on the side of a flat bed without using bedrails?: None Help needed moving to and from a bed to a chair (including a wheelchair)?: A Little Help needed standing up from a chair using your arms (e.g., wheelchair or bedside chair)?: A Little Help needed to walk in hospital room?: A Little Help needed climbing 3-5 steps with a railing? : A  Lot 6 Click Score: 19    End of Session Equipment Utilized During Treatment: Oxygen Activity Tolerance: Patient limited by fatigue (limited by desaturation) Patient left: in bed;with call bell/phone within reach;with bed alarm set   Jean Shaffer Visit Diagnosis: Difficulty in walking, not elsewhere classified (R26.2);Unsteadiness on feet (R26.81)     Time: 6440-3474 Jean Shaffer Time Calculation (min) (ACUTE ONLY): 15 min  Charges:  $Gait Training: 8-22 mins                        Doreatha Massed, Jean Shaffer Acute Rehabilitation  Office: [phone] Pager: [phone]

## 2020-10-02 NOTE — Evaluation (Signed)
Occupational Therapy Evaluation Patient Details Name: Jean Shaffer MRN: [id number] DOB: 12-14-1951 Today's Date: 10/02/2020    History of Present Illness Jean Shaffer is a 69 y.o. female with Past medical history of asthma and COPD, chronic back pain, HTN, hypothyroidism. Patient presented with complaints of cough and shortness of breath.  Patient started with symptoms on September 23, 2020. Admitted to hospital with COVID pneumonia.   Clinical Impression   Jean Shaffer is a 69 year old woman with above medical history who presents on 6 L [place] supine in bed. On evaluation patient demonstrates generalized weakness, poor activity tolerance, decreased balance and impaired cardiopulmonary status resulting in decreased ability to safely and independently perform ADLs and mobility. Patient supervision for bed mobility, min assist for steadying with transfers and set up and predominately seated positioning required for ADLs. Patient's o2  Sat dropped to 83% with transfer into sitting, 81% after stand pivot to chair and maintained 84-85% for 12 minutes without further recovery in seated position. Patient appeared fatigued and head "bobbing" in upright position. Patient returned to side lying in bed due to fatigue with sat dropping to 79%. Patient 83% on 6 L with RN in room when therapist left the room. Patient educated on use of breathing devices, prone positioning and mobility for recovery. Patient verbalized understanding but will need reieration. Patient will benefit from skilled OT services to improve activity tolerance and cardiopulmonary status  to reduce oxygenation needs in order for patient to return home at discharge.     Follow Up Recommendations  Home health OT    Equipment Recommendations  Tub/shower seat    Recommendations for Other Services       Precautions / Restrictions Precautions Precautions: Fall Precaution Comments: sats Restrictions Weight Bearing Restrictions:  No      Mobility Bed Mobility Overal bed mobility: Needs Assistance Bed Mobility: Supine to Sit;Sit to Supine     Supine to sit: Supervision Sit to supine: Supervision   General bed mobility comments: extra time to push self upright. rest breaks frequently  Transfers Overall transfer level: Needs assistance Equipment used: 1 person hand held assist Transfers: Sit to/from Stand Sit to Stand: Min assist         General transfer comment: steadying assistance from bed to chair and vice versa    Balance Overall balance assessment: Mild deficits observed, not formally tested                                         ADL either performed or assessed with clinical judgement   ADL Overall ADL's : Needs assistance/impaired Eating/Feeding: Independent   Grooming: Set up   Upper Body Bathing: Set up   Lower Body Bathing: Set up;Sitting/lateral leans   Upper Body Dressing : Set up;Sitting   Lower Body Dressing: Set up;Sit to/from stand Lower Body Dressing Details (indicate cue type and reason): predominantly sitting Toilet Transfer: Minimal assistance;Stand-pivot;BSC   Toileting- Clothing Manipulation and Hygiene: Min guard;Sit to/from stand       Functional mobility during ADLs: Minimal assistance General ADL Comments: min assist for steadying     Vision Baseline Vision/History: Wears glasses Wears Glasses: Reading only Patient Visual Report: No change from baseline       Perception     Praxis      Pertinent Vitals/Pain Pain Assessment: No/denies pain     Hand Dominance  Extremity/Trunk Assessment Upper Extremity Assessment Upper Extremity Assessment: Generalized weakness   Lower Extremity Assessment Lower Extremity Assessment: Defer to PT evaluation   Cervical / Trunk Assessment Cervical / Trunk Assessment: Normal   Communication Communication Communication: No difficulties   Cognition Arousal/Alertness: Awake/alert Behavior  During Therapy: WFL for tasks assessed/performed Overall Cognitive Status: Within Functional Limits for tasks assessed                                     General Comments       Exercises     Shoulder Instructions      Home Living Family/patient expects to be discharged to:: Private residence Living Arrangements: Children;Other relatives Available Help at Discharge: Family Type of Home: House Home Access: Stairs to enter [employer] of Steps: 1   Home Layout: One level     Bathroom Shower/Tub: Teacher, early years/pre: Standard     Home Equipment: Cane - single point   Additional Comments: Reports usinga cane PRN secondary to "back problems"      Prior Functioning/Environment Level of Independence: Independent                 OT Problem List: Decreased strength;Decreased activity tolerance;Impaired balance (sitting and/or standing);Decreased knowledge of use of DME or AE;Cardiopulmonary status limiting activity      OT Treatment/Interventions: Self-care/ADL training;Therapeutic exercise;Energy conservation;DME and/or AE instruction;Therapeutic activities;Balance training;Patient/family education    OT Goals(Current goals can be found in the care plan section) Acute Rehab OT Goals Patient Stated Goal: to feel better OT Goal Formulation: With patient Time For Goal Achievement: 10/16/20 Potential to Achieve Goals: Good  OT Frequency: Min 2X/week   Barriers to D/C:            Co-evaluation              AM-PAC OT "6 Clicks" Daily Activity     Outcome Measure Help from another person eating meals?: None Help from another person taking care of personal grooming?: A Little Help from another person toileting, which includes using toliet, bedpan, or urinal?: A Little Help from another person bathing (including washing, rinsing, drying)?: A Little Help from another person to put on and taking off regular upper body  clothing?: A Little Help from another person to put on and taking off regular lower body clothing?: A Little 6 Click Score: 19   End of Session Equipment Utilized During Treatment: Oxygen Nurse Communication: Mobility status (o2 sats)  Activity Tolerance: Patient limited by fatigue Patient left: in bed;with call bell/phone within reach;with nursing/sitter in room  OT Visit Diagnosis: Muscle weakness (generalized) (M62.81)                Time: 5809-9833 OT Time Calculation (min): 21 min Charges:  OT General Charges $OT Visit: 1 Visit OT Evaluation $OT Eval Moderate Complexity: 1 Mod  Demorris Choyce, OTR/L [place]  Office [phone] Pager: [phone]   Lenward Chancellor 10/02/2020, 11:36 AM

## 2020-10-03 ENCOUNTER — Inpatient Hospital Stay (HOSPITAL_COMMUNITY): Payer: Medicare Other

## 2020-10-03 DIAGNOSIS — J9601 Acute respiratory failure with hypoxia: Secondary | ICD-10-CM | POA: Diagnosis not present

## 2020-10-03 LAB — CBC WITH DIFFERENTIAL/PLATELET
Abs Immature Granulocytes: 0.04 10*3/uL (ref 0.00–0.07)
Basophils Absolute: 0 10*3/uL (ref 0.0–0.1)
Basophils Relative: 0 %
Eosinophils Absolute: 0 10*3/uL (ref 0.0–0.5)
Eosinophils Relative: 0 %
HCT: 36.1 % (ref 36.0–46.0)
Hemoglobin: 11.9 g/dL — ABNORMAL LOW (ref 12.0–15.0)
Immature Granulocytes: 1 %
Lymphocytes Relative: 16 %
Lymphs Abs: 0.9 10*3/uL (ref 0.7–4.0)
MCH: 28.5 pg (ref 26.0–34.0)
MCHC: 33 g/dL (ref 30.0–36.0)
MCV: 86.4 fL (ref 80.0–100.0)
Monocytes Absolute: 1 10*3/uL (ref 0.1–1.0)
Monocytes Relative: 17 %
Neutro Abs: 3.9 10*3/uL (ref 1.7–7.7)
Neutrophils Relative %: 66 %
Platelets: 303 10*3/uL (ref 150–400)
RBC: 4.18 MIL/uL (ref 3.87–5.11)
RDW: 13.3 % (ref 11.5–15.5)
WBC: 5.9 10*3/uL (ref 4.0–10.5)
nRBC: 0 % (ref 0.0–0.2)

## 2020-10-03 LAB — COMPREHENSIVE METABOLIC PANEL
ALT: 71 U/L — ABNORMAL HIGH (ref 0–44)
AST: 79 U/L — ABNORMAL HIGH (ref 15–41)
Albumin: 2.9 g/dL — ABNORMAL LOW (ref 3.5–5.0)
Alkaline Phosphatase: 103 U/L (ref 38–126)
Anion gap: 9 (ref 5–15)
BUN: 20 mg/dL (ref 8–23)
CO2: 27 mmol/L (ref 22–32)
Calcium: 8.6 mg/dL — ABNORMAL LOW (ref 8.9–10.3)
Chloride: 99 mmol/L (ref 98–111)
Creatinine, Ser: 0.64 mg/dL (ref 0.44–1.00)
GFR, Estimated: >60 mL/min (ref >60)
Glucose, Bld: 159 mg/dL — ABNORMAL HIGH (ref 70–99)
Potassium: 3.9 mmol/L (ref 3.5–5.1)
Sodium: 135 mmol/L (ref 135–145)
Total Bilirubin: 0.5 mg/dL (ref 0.3–1.2)
Total Protein: 6.2 g/dL — ABNORMAL LOW (ref 6.5–8.1)

## 2020-10-03 LAB — BRAIN NATRIURETIC PEPTIDE: B Natriuretic Peptide: 65 pg/mL (ref 0.0–100.0)

## 2020-10-03 LAB — MAGNESIUM: Magnesium: 2.1 mg/dL (ref 1.7–2.4)

## 2020-10-03 LAB — PHOSPHORUS: Phosphorus: 3.9 mg/dL (ref 2.5–4.6)

## 2020-10-03 LAB — D-DIMER, QUANTITATIVE: D-Dimer, Quant: 0.61 ug/mL-FEU — ABNORMAL HIGH (ref 0.00–0.50)

## 2020-10-03 LAB — C-REACTIVE PROTEIN: CRP: 0.6 mg/dL (ref <1.0)

## 2020-10-03 MED ORDER — INSULIN ASPART 100 UNIT/ML ~~LOC~~ SOLN
0.0000 [IU] | Freq: Three times a day (TID) | SUBCUTANEOUS | Status: DC
  Administered 2020-10-03 – 2020-10-04 (×3): 2 [IU] via SUBCUTANEOUS
  Administered 2020-10-04: 3 [IU] via SUBCUTANEOUS
  Administered 2020-10-05 (×2): 2 [IU] via SUBCUTANEOUS
  Administered 2020-10-06: 1 [IU] via SUBCUTANEOUS
  Administered 2020-10-06: 2 [IU] via SUBCUTANEOUS
  Administered 2020-10-07 – 2020-10-08 (×2): 1 [IU] via SUBCUTANEOUS
  Administered 2020-10-08 – 2020-10-10 (×3): 2 [IU] via SUBCUTANEOUS
  Administered 2020-10-10: 1 [IU] via SUBCUTANEOUS
  Administered 2020-10-11: 2 [IU] via SUBCUTANEOUS
  Administered 2020-10-12: 1 [IU] via SUBCUTANEOUS
  Administered 2020-10-12: 3 [IU] via SUBCUTANEOUS
  Administered 2020-10-13: 2 [IU] via SUBCUTANEOUS
  Administered 2020-10-14 – 2020-10-22 (×6): 1 [IU] via SUBCUTANEOUS
  Administered 2020-10-23: 3 [IU] via SUBCUTANEOUS
  Administered 2020-10-25: 1 [IU] via SUBCUTANEOUS

## 2020-10-03 MED ORDER — METHYLPREDNISOLONE SODIUM SUCC 40 MG IJ SOLR
40.0000 mg | Freq: Four times a day (QID) | INTRAMUSCULAR | Status: AC
  Administered 2020-10-03 (×3): 40 mg via INTRAVENOUS
  Filled 2020-10-03 (×3): qty 1

## 2020-10-03 MED ORDER — METHYLPREDNISOLONE SODIUM SUCC 40 MG IJ SOLR
40.0000 mg | Freq: Two times a day (BID) | INTRAMUSCULAR | Status: AC
  Administered 2020-10-04 – 2020-10-08 (×10): 40 mg via INTRAVENOUS
  Filled 2020-10-03 (×10): qty 1

## 2020-10-03 NOTE — Progress Notes (Addendum)
PROGRESS NOTE    SHERVON KERWIN  ZOX:[id number] DOB: Apr 26, 1951 DOA: 09/30/2020 PCP: [name], MD   Chief Complaint  Patient presents with  . Shortness of Breath   Brief Narrative: PATTON SWISHER is Aleira Deiter 69 y.o. female with Past medical history of asthma and COPD, chronic back pain, HTN, hypothyroidism. Patient presented with complaints of cough and shortness of breath.  Patient started with symptoms on September 23, 2020.  Tested at [facility] facility.  Presented 10/7 for monoclonal antibody infusion but was found hypoxic and therefore was transferred to ER for further work-up.  She's been admitted for COVID 19 pneumonia.   Assessment & Plan:   Active Problems:   Pneumonia due to COVID-19 virus   Acute respiratory failure with hypoxia (HCC)  Acute Hypoxic Respiratory Failure 2/2 COVID 19 Pneumonia CXR with patchy bilateral opacities Repeat CXR 10/10 Worsening O2 needs, requiring 8 L today, she's significantly SOB with exertion Continue steroids and remdesivir.  Continue baricitinib. Low suspicion for bacterial infection, procal <0.1 Strict I/O, daily weights Prone as able, OOB, IS, flutter valve, therapy.  COVID-19 Labs  Recent Labs    09/30/20 1543 09/30/20 1543 10/01/20 0418 10/02/20 0431 10/03/20 0442  DDIMER 1.03*   < > 1.18* 0.91* 0.61*  FERRITIN 863*  --   --  1,396*  --   LDH 445*  --   --   --   --   CRP 3.5*   < > 4.5* 1.5* 0.6   < > = values in this interval not displayed.    Lab Results  Component Value Date   SARSCOV2NAA NEGATIVE 01/26/2020   [place] NEGATIVE 12/27/2019   Elevated LFTs: 2/2 covid, follow acute hepatitis panel (negative)  Leukopenia: improved  COPD  Continue inhalers and steroids No wheezing suggestive of exacerbation on my exam today   Hypothyroidism Continue synthroid  Essential Hypertension BP meds on hold   Chronic Back Pain norco prn  Depression Wellbutrin  DVT prophylaxis: lovenox Code Status: full    Family Communication: discussed with son 10/10 Disposition:   Status is: Inpatient  Remains inpatient appropriate because:Inpatient level of care appropriate due to severity of illness   Dispo: The patient is from: Home              Anticipated d/c is to: pending              Anticipated d/c date is: > 3 days              Patient currently is not medically stable to d/c.  Consultants:   none  Procedures:   none  Antimicrobials:  Anti-infectives (From admission, onward)   Start     Dose/Rate Route Frequency Ordered Stop   10/01/20 1000  remdesivir 100 mg in sodium chloride 0.9 % 100 mL IVPB       "Followed by" Linked Group Details   100 mg 200 mL/hr over 30 Minutes Intravenous Daily 09/30/20 1633 10/05/20 0959   10/01/20 1000  remdesivir 100 mg in sodium chloride 0.9 % 100 mL IVPB  Status:  Discontinued       "Followed by" Linked Group Details   100 mg 200 mL/hr over 30 Minutes Intravenous Daily 09/30/20 1947 09/30/20 1951   09/30/20 2000  remdesivir 200 mg in sodium chloride 0.9% 250 mL IVPB  Status:  Discontinued       "Followed by" Linked Group Details   200 mg 580 mL/hr over 30 Minutes Intravenous Once 09/30/20 1947  09/30/20 1951   09/30/20 1800  remdesivir 200 mg in sodium chloride 0.9% 250 mL IVPB       "Followed by" Linked Group Details   200 mg 580 mL/hr over 30 Minutes Intravenous Once 09/30/20 1633 09/30/20 1819     Subjective: Progressive SOB  Objective: Vitals:   10/02/20 2300 10/03/20 0300 10/03/20 0422 10/03/20 0500  BP:   131/62   Pulse:   76   Resp:   18   Temp:   98.4 F (36.9 C)   TempSrc:   Oral   SpO2: (!) 88% 92% 93% 90%  Weight:   64.1 kg   Height:        Intake/Output Summary (Last 24 hours) at 10/03/2020 1216 Last data filed at 10/03/2020 0915 Gross per 24 hour  Intake 480 ml  Output --  Net 480 ml   Filed Weights   09/30/20 1617 10/02/20 0451 10/03/20 0422  Weight: 64.9 kg 64.5 kg 64.1 kg    Examination:  General: No  acute distress. Cardiovascular: Heart sounds show Jamyson Jirak regular rate, and rhythm. No gallops or rubs. No murmurs. No JVD. Lungs: increased wob, diffuse crackles  Abdomen: Soft, nontender, nondistended Neurological: Alert and oriented 3. Moves all extremities 4. Cranial nerves II through XII grossly intact. Skin: Warm and dry. No rashes or lesions. Extremities: No clubbing or cyanosis. No edema.   Data Reviewed: I have personally reviewed following labs and imaging studies  CBC: Recent Labs  Lab 09/30/20 1543 10/01/20 0418 10/02/20 0431 10/03/20 0442  WBC 3.0* 1.8* 6.1 5.9  NEUTROABS 2.3 1.1* 4.4 3.9  HGB 12.4 12.3 12.5 11.9*  HCT 37.3 37.5 38.0 36.1  MCV 85.9 86.0 87.2 86.4  PLT 146* 170 225 923    Basic Metabolic Panel: Recent Labs  Lab 09/30/20 1543 10/02/20 0431 10/03/20 0442  NA 136 137 135  K 3.4* 4.1 3.9  CL 96* 99 99  CO2 26 26 27   GLUCOSE 109* 138* 159*  BUN 12 22 20   CREATININE 0.91 0.66 0.64  CALCIUM 8.7* 9.2 8.6*  MG  --  2.2 2.1  PHOS  --  2.8 3.9    GFR: Estimated Creatinine Clearance: 57.3 mL/min (by C-G formula based on SCr of 0.64 mg/dL).  Liver Function Tests: Recent Labs  Lab 09/30/20 1543 10/02/20 0431 10/03/20 0442  AST 111* 119* 79*  ALT 60* 82* 71*  ALKPHOS 104 114 103  BILITOT 0.6 0.5 0.5  PROT 7.3 6.3* 6.2*  ALBUMIN 3.4* 3.1* 2.9*    CBG: No results for input(s): GLUCAP in the last 168 hours.   Recent Results (from the past 240 hour(s))  Blood Culture (routine x 2)     Status: None (Preliminary result)   Collection Time: 09/30/20  3:43 PM   Specimen: BLOOD  Result Value Ref Range Status   Specimen Description   Final    BLOOD LEFT ANTECUBITAL Performed at [facility] [address] [place], [place] [address]    Special Requests   Final    BOTTLES DRAWN AEROBIC AND ANAEROBIC Blood Culture results may not be optimal due to an excessive volume of blood received in culture bottles Performed at [facility] [address] [place], [place] [address]    Culture   Final    NO GROWTH 2 DAYS Performed at [facility] [address] [place], [place] [address]    Report Status PENDING  Incomplete  Blood Culture (routine x  2)     Status: None (Preliminary result)   Collection Time: 09/30/20  3:45 PM   Specimen: BLOOD  Result Value Ref Range Status   Specimen Description   Final    BLOOD RIGHT ANTECUBITAL Performed at [facility] [address] [place], [place] [address]    Special Requests   Final    BOTTLES DRAWN AEROBIC AND ANAEROBIC Blood Culture adequate volume Performed at [facility] [address], [place], [place] [address]    Culture   Final    NO GROWTH 2 DAYS Performed at [facility] [address] [place], [place] [address]    Report Status PENDING  Incomplete         Radiology Studies: No results found.      Scheduled Meds: . vitamin C  500 mg Oral Daily  . aspirin EC  81 mg Oral Daily  . baricitinib  4 mg Oral Daily  . buPROPion  150 mg Oral Daily  . docusate sodium  100 mg Oral BID  . enoxaparin (LOVENOX) injection  40 mg Subcutaneous Q24H  . feeding supplement (ENSURE ENLIVE)  237 mL Oral BID BM  . fluticasone furoate-vilanterol  1 puff Inhalation Daily  . levothyroxine  50 mcg Oral QAC breakfast  . methylPREDNISolone (SOLU-MEDROL) injection  40 mg Intravenous Q12H  . multivitamin with minerals  1 tablet Oral Daily  . polyethylene glycol  17 g Oral Daily  . umeclidinium bromide  1 puff Inhalation Daily  . zinc sulfate  220 mg Oral Daily   Continuous Infusions: . remdesivir 100 mg in NS 100 mL 100 mg (10/03/20 1140)     LOS: 3 days    Time spent: over 30 min    Fayrene Helper, MD [facility]   To contact the attending provider between 7A-7P or the covering provider during after hours 7P-7A, please log into the web site [email or web address] and access using  universal [place] password for that web site. If you do not have the password, please call the hospital operator.  10/03/2020, 12:16 PM

## 2020-10-03 NOTE — Progress Notes (Signed)
This shift RN informed by NT that pt desats in low 70's when transferring to [facility] and recovery takes quite some time. Educated pt on importance of conserving agency and having oxygen in place. Asked pt to consider purewick, she was agreeable and it was applied. Will continue to monitor.

## 2020-10-04 ENCOUNTER — Inpatient Hospital Stay (HOSPITAL_COMMUNITY): Payer: Medicare Other

## 2020-10-04 DIAGNOSIS — J1282 Pneumonia due to coronavirus disease 2019: Secondary | ICD-10-CM

## 2020-10-04 DIAGNOSIS — U071 COVID-19: Secondary | ICD-10-CM | POA: Diagnosis not present

## 2020-10-04 DIAGNOSIS — R7989 Other specified abnormal findings of blood chemistry: Secondary | ICD-10-CM

## 2020-10-04 LAB — CBC WITH DIFFERENTIAL/PLATELET
Abs Immature Granulocytes: 0.11 10*3/uL — ABNORMAL HIGH (ref 0.00–0.07)
Basophils Absolute: 0 10*3/uL (ref 0.0–0.1)
Basophils Relative: 0 %
Eosinophils Absolute: 0 10*3/uL (ref 0.0–0.5)
Eosinophils Relative: 0 %
HCT: 35.9 % — ABNORMAL LOW (ref 36.0–46.0)
Hemoglobin: 11.9 g/dL — ABNORMAL LOW (ref 12.0–15.0)
Immature Granulocytes: 2 %
Lymphocytes Relative: 13 %
Lymphs Abs: 0.8 10*3/uL (ref 0.7–4.0)
MCH: 28.5 pg (ref 26.0–34.0)
MCHC: 33.1 g/dL (ref 30.0–36.0)
MCV: 86.1 fL (ref 80.0–100.0)
Monocytes Absolute: 1 10*3/uL (ref 0.1–1.0)
Monocytes Relative: 17 %
Neutro Abs: 4 10*3/uL (ref 1.7–7.7)
Neutrophils Relative %: 68 %
Platelets: 337 10*3/uL (ref 150–400)
RBC: 4.17 MIL/uL (ref 3.87–5.11)
RDW: 13.2 % (ref 11.5–15.5)
WBC: 5.9 10*3/uL (ref 4.0–10.5)
nRBC: 0 % (ref 0.0–0.2)

## 2020-10-04 LAB — PHOSPHORUS: Phosphorus: 4.3 mg/dL (ref 2.5–4.6)

## 2020-10-04 LAB — COMPREHENSIVE METABOLIC PANEL
ALT: 65 U/L — ABNORMAL HIGH (ref 0–44)
AST: 51 U/L — ABNORMAL HIGH (ref 15–41)
Albumin: 2.9 g/dL — ABNORMAL LOW (ref 3.5–5.0)
Alkaline Phosphatase: 95 U/L (ref 38–126)
Anion gap: 9 (ref 5–15)
BUN: 21 mg/dL (ref 8–23)
CO2: 28 mmol/L (ref 22–32)
Calcium: 8.7 mg/dL — ABNORMAL LOW (ref 8.9–10.3)
Chloride: 100 mmol/L (ref 98–111)
Creatinine, Ser: 0.64 mg/dL (ref 0.44–1.00)
GFR, Estimated: >60 mL/min (ref >60)
Glucose, Bld: 171 mg/dL — ABNORMAL HIGH (ref 70–99)
Potassium: 4.2 mmol/L (ref 3.5–5.1)
Sodium: 137 mmol/L (ref 135–145)
Total Bilirubin: 0.7 mg/dL (ref 0.3–1.2)
Total Protein: 6.1 g/dL — ABNORMAL LOW (ref 6.5–8.1)

## 2020-10-04 LAB — GLUCOSE, CAPILLARY
Glucose-Capillary: 191 mg/dL — ABNORMAL HIGH (ref 70–99)
Glucose-Capillary: 222 mg/dL — ABNORMAL HIGH (ref 70–99)
Glucose-Capillary: 167 mg/dL — ABNORMAL HIGH (ref 70–99)
Glucose-Capillary: 173 mg/dL — ABNORMAL HIGH (ref 70–99)

## 2020-10-04 LAB — D-DIMER, QUANTITATIVE: D-Dimer, Quant: 0.55 ug/mL-FEU — ABNORMAL HIGH (ref 0.00–0.50)

## 2020-10-04 LAB — MAGNESIUM: Magnesium: 2.2 mg/dL (ref 1.7–2.4)

## 2020-10-04 LAB — C-REACTIVE PROTEIN: CRP: 0.6 mg/dL (ref <1.0)

## 2020-10-04 NOTE — Care Management Important Message (Signed)
Important Message  Patient Details IM Letter given to the Patient Name: Jean Shaffer MRN: [id number] Date of Birth: 01/26/1951   Medicare Important Message Given:  Yes     Kerin Salen 10/04/2020, 11:16 AM

## 2020-10-04 NOTE — Progress Notes (Signed)
Bilateral lower extremity venous duplex has been completed. Preliminary results can be found in CV Proc through chart review.   10/04/20 10:21 AM Jean Shaffer RVT

## 2020-10-04 NOTE — TOC Initial Note (Signed)
Transition of Care [facility]) - Initial/Assessment Note    Patient Details  Name: Jean Shaffer MRN: [id number] Date of Birth: 1951/08/16  [facility] CM/SW Contact:    Trish Mage, LCSW Phone Number: 10/04/2020, 11:44 AM  Clinical Narrative:   Patient seen in follow up to PT recommendation of [facility] services.  Jean Shaffer lives in [place] with her son, daughter in law and their children.  She is open to working with [facility] PT, is also interested in walker and shower seat.  Spoke with Cindie at [facility] who confirmed ability to provide [facility] services for patient at d/c.  Will secure orders at d/c.  No further current needs identified. TOC will continue to follow during the course of hospitalization.                  Expected Discharge Plan: [place] Barriers to Discharge: No Barriers Identified   Patient Goals and CMS Choice     Choice offered to / list presented to : Patient  Expected Discharge Plan and Services Expected Discharge Plan: [place]   Discharge Planning Services: CM Consult Post Acute Care Choice: [place] arrangements for the past 2 months: Single Family Home                                      Prior Living Arrangements/Services Living arrangements for the past 2 months: Single Family Home Lives with:: Adult Children   Do you feel safe going back to the place where you live?: Yes               Activities of Daily Living Home Assistive Devices/Equipment: Eyeglasses, Cane (specify quad or straight) ADL Screening (condition at time of admission) Patient's cognitive ability adequate to safely complete daily activities?: Yes Is the patient deaf or have difficulty hearing?: No Does the patient have difficulty seeing, even when wearing glasses/contacts?: No Does the patient have difficulty concentrating, remembering, or making decisions?: No Patient able to express need for assistance with ADLs?:  Yes Does the patient have difficulty dressing or bathing?: Yes Independently performs ADLs?: No Communication: Independent Dressing (OT): Needs assistance Is this a change from baseline?: Change from baseline, expected to last >3 days Grooming: Independent Feeding: Independent Bathing: Needs assistance Is this a change from baseline?: Change from baseline, expected to last >3 days Toileting: Needs assistance Is this a change from baseline?: Change from baseline, expected to last >3days In/Out Bed: Needs assistance Is this a change from baseline?: Change from baseline, expected to last >3 days Walks in Home: Needs assistance Is this a change from baseline?: Change from baseline, expected to last >3 days Does the patient have difficulty walking or climbing stairs?: Yes Weakness of Legs: Both Weakness of Arms/Hands: Both  Permission Sought/Granted                  Emotional Assessment Appearance:: Appears older than stated age Attitude/Demeanor/Rapport: Engaged Affect (typically observed): Appropriate Orientation: : Oriented to Self, Oriented to Place, Oriented to Situation Alcohol / Substance Use: Not Applicable Psych Involvement: No (comment)  Admission diagnosis:  Acute respiratory failure with hypoxia ([place]) [J96.01] Pneumonia due to COVID-19 virus [U07.1, J12.82] COVID-19 [U07.1] Patient Active Problem List   Diagnosis Date Noted  . Acute respiratory failure with hypoxia ([place])   . Pneumonia due to COVID-19 virus 09/30/2020  .  S/P laparoscopic appendectomy 01/29/2020  . Perforated appendicitis 12/27/2019  . Palpitations 02/05/2019  . Syncope 12/07/2018  . HTN (hypertension) 12/07/2018  . COPD (chronic obstructive pulmonary disease) ([place]) 12/07/2018   PCP:  [name], MD Pharmacy:   [id number] [address], [name] [address] Phone: [phone] Fax: [phone]     Social Determinants of  Health (SDOH) Interventions    Readmission Risk Interventions No flowsheet data found.

## 2020-10-04 NOTE — Progress Notes (Signed)
Physical Therapy Treatment Patient Details Name: Jean Shaffer MRN: [id number] DOB: 10-Dec-1951 Today's Date: 10/04/2020    History of Present Illness Jean Shaffer is a 69 y.o. female with Past medical history of asthma and COPD, chronic back pain, HTN, hypothyroidism. Patient presented with complaints of cough and shortness of breath.  Patient started with symptoms on September 23, 2020. Admitted to hospital with COVID pneumonia.    PT Comments    The patient resting in bed on 8 L HFNC, SPO2 94% and is very weak and noted to desaturate to just transfer to [facility] and back to bed, dropping to 81 %. Patient with gradual return to 92%.  Continue progressive mobility as tolerated.  Follow Up Recommendations  Home health PT     Equipment Recommendations  None recommended by PT    Recommendations for Other Services       Precautions / Restrictions Precautions Precaution Comments: monitor sats    Mobility  Bed Mobility   Bed Mobility: Sidelying to Sit;Sit to Sidelying   Sidelying to sit: Supervision     Sit to sidelying: Supervision General bed mobility comments: Increased time.  Transfers   Equipment used: 1 person hand held assist Transfers: Sit to/from [employer] to Stand: Min guard Stand pivot transfers: Min guard       General transfer comment: Unsteady. Increased time. leans forward and props on elbows to recover..  Ambulation/Gait                 Stairs             Wheelchair Mobility    Modified Rankin (Stroke Patients Only)       Balance   Sitting-balance support: Feet supported;Bilateral upper extremity supported Sitting balance-Leahy Scale: Good     Standing balance support: No upper extremity supported;During functional activity Standing balance-Leahy Scale: Poor                              Cognition Arousal/Alertness: Awake/alert Behavior During Therapy: Anxious;Flat affect                                           Exercises      General Comments        Pertinent Vitals/Pain      Home Living                      Prior Function            PT Goals (current goals can now be found in the care plan section) Progress towards PT goals: Progressing toward goals    Frequency    Min 3X/week      PT Plan      Co-evaluation              AM-PAC PT "6 Clicks" Mobility   Outcome Measure  Help needed turning from your back to your side while in a flat bed without using bedrails?: None Help needed moving from lying on your back to sitting on the side of a flat bed without using bedrails?: None Help needed moving to and from a bed to a chair (including a wheelchair)?: A Little Help needed standing up from a chair using your arms (e.g., wheelchair or bedside chair)?: A Little Help needed to walk  in hospital room?: A Lot Help needed climbing 3-5 steps with a railing? : A Lot 6 Click Score: 18    End of Session Equipment Utilized During Treatment: Oxygen Activity Tolerance: Patient limited by fatigue;Treatment limited secondary to medical complications (Comment) Patient left: in bed;with call bell/phone within reach;with bed alarm set Nurse Communication: Mobility status PT Visit Diagnosis: Difficulty in walking, not elsewhere classified (R26.2);Unsteadiness on feet (R26.81)     Time: 4081-4481 PT Time Calculation (min) (ACUTE ONLY): 10 min  Charges:  $Therapeutic Activity: 8-22 mins                     Tresa Endo PT Acute Rehabilitation Services Pager [phone] Office [phone]    Claretha Cooper 10/04/2020, 1:30 PM

## 2020-10-04 NOTE — Progress Notes (Addendum)
PROGRESS NOTE    Jean Shaffer  NFA:[id number] DOB: 1951-05-16 DOA: 09/30/2020 PCP: [name], MD   Chief Complaint  Patient presents with  . Shortness of Breath   Brief Narrative: Jean Shaffer is Jean Shaffer 69 y.o. female with Past medical history of asthma and COPD, chronic back pain, HTN, hypothyroidism. Patient presented with complaints of cough and shortness of breath.  Patient started with symptoms on September 23, 2020.  Tested at [facility] facility.  Presented 10/7 for monoclonal antibody infusion but was found hypoxic and therefore was transferred to ER for further work-up.  She's been admitted for COVID 19 pneumonia.   Assessment & Plan:   Active Problems:   Pneumonia due to COVID-19 virus   Acute respiratory failure with hypoxia (HCC)  Acute Hypoxic Respiratory Failure 2/2 COVID 19 Pneumonia CXR with patchy bilateral opacities Repeat CXR 10/10 Worsening O2 needs, requiring 8-9 L today, she's still significantly SOB with exertion Continue steroids and remdesivir.  Continue baricitinib. Low suspicion for bacterial infection, procal <0.1 Strict I/O, daily weights Prone as able, OOB, IS, flutter valve, therapy.  COVID-19 Labs  Recent Labs    10/02/20 0431 10/03/20 0442 10/04/20 0442  DDIMER 0.91* 0.61* 0.55*  FERRITIN 1,396*  --   --   CRP 1.5* 0.6 0.6    Lab Results  Component Value Date   SARSCOV2NAA NEGATIVE 01/26/2020   [name] NEGATIVE 12/27/2019   Elevated LFTs: 2/2 covid, follow acute hepatitis panel (negative)  Leukopenia: improved  COPD  Continue inhalers and steroids No wheezing suggestive of exacerbation on my exam today   Hypothyroidism Continue synthroid  Essential Hypertension BP meds on hold   Chronic Back Pain norco prn  Depression Wellbutrin  DVT prophylaxis: lovenox Code Status: full  Family Communication: discussed with son 10/11 Disposition:   Status is: Inpatient  Remains inpatient appropriate  because:Inpatient level of care appropriate due to severity of illness   Dispo: The patient is from: Home              Anticipated d/c is to: pending              Anticipated d/c date is: > 3 days              Patient currently is not medically stable to d/c.  Consultants:   none  Procedures:   none  Antimicrobials:  Anti-infectives (From admission, onward)   Start     Dose/Rate Route Frequency Ordered Stop   10/01/20 1000  remdesivir 100 mg in sodium chloride 0.9 % 100 mL IVPB       "Followed by" Linked Group Details   100 mg 200 mL/hr over 30 Minutes Intravenous Daily 09/30/20 1633 10/04/20 1030   10/01/20 1000  remdesivir 100 mg in sodium chloride 0.9 % 100 mL IVPB  Status:  Discontinued       "Followed by" Linked Group Details   100 mg 200 mL/hr over 30 Minutes Intravenous Daily 09/30/20 1947 09/30/20 1951   09/30/20 2000  remdesivir 200 mg in sodium chloride 0.9% 250 mL IVPB  Status:  Discontinued       "Followed by" Linked Group Details   200 mg 580 mL/hr over 30 Minutes Intravenous Once 09/30/20 1947 09/30/20 1951   09/30/20 1800  remdesivir 200 mg in sodium chloride 0.9% 250 mL IVPB       "Followed by" Linked Group Details   200 mg 580 mL/hr over 30 Minutes Intravenous Once 09/30/20 1633 09/30/20 1819  Subjective: Progressive SOB  Objective: Vitals:   10/04/20 0457 10/04/20 0600 10/04/20 0617 10/04/20 1410  BP: (!) 120/51   (!) 118/53  Pulse: 73   83  Resp: 20   19  Temp: 98.3 F (36.8 C)   98.3 F (36.8 C)  TempSrc: Oral     SpO2: 91% 94% 90% (!) 89%  Weight: 62.4 kg     Height:        Intake/Output Summary (Last 24 hours) at 10/04/2020 1656 Last data filed at 10/04/2020 0858 Gross per 24 hour  Intake 360 ml  Output --  Net 360 ml   Filed Weights   10/02/20 0451 10/03/20 0422 10/04/20 0457  Weight: 64.5 kg 64.1 kg 62.4 kg    Examination:  General: No acute distress. Cardiovascular: Heart sounds show Jean Shaffer regular rate, and rhythm Lungs:  diminished Abdomen: Soft, nontender, nondistended Neurological: Alert and oriented 3. Moves all extremities 4. Cranial nerves II through XII grossly intact. Skin: Warm and dry. No rashes or lesions. Extremities: No clubbing or cyanosis. No edema.   Data Reviewed: I have personally reviewed following labs and imaging studies  CBC: Recent Labs  Lab 09/30/20 1543 10/01/20 0418 10/02/20 0431 10/03/20 0442 10/04/20 0442  WBC 3.0* 1.8* 6.1 5.9 5.9  NEUTROABS 2.3 1.1* 4.4 3.9 4.0  HGB 12.4 12.3 12.5 11.9* 11.9*  HCT 37.3 37.5 38.0 36.1 35.9*  MCV 85.9 86.0 87.2 86.4 86.1  PLT 146* 170 225 303 174    Basic Metabolic Panel: Recent Labs  Lab 09/30/20 1543 10/02/20 0431 10/03/20 0442 10/04/20 0442  NA 136 137 135 137  K 3.4* 4.1 3.9 4.2  CL 96* 99 99 100  CO2 26 26 27 28   GLUCOSE 109* 138* 159* 171*  BUN 12 22 20 21   CREATININE 0.91 0.66 0.64 0.64  CALCIUM 8.7* 9.2 8.6* 8.7*  MG  --  2.2 2.1 2.2  PHOS  --  2.8 3.9 4.3    GFR: Estimated Creatinine Clearance: 57.3 mL/min (by C-G formula based on SCr of 0.64 mg/dL).  Liver Function Tests: Recent Labs  Lab 09/30/20 1543 10/02/20 0431 10/03/20 0442 10/04/20 0442  AST 111* 119* 79* 51*  ALT 60* 82* 71* 65*  ALKPHOS 104 114 103 95  BILITOT 0.6 0.5 0.5 0.7  PROT 7.3 6.3* 6.2* 6.1*  ALBUMIN 3.4* 3.1* 2.9* 2.9*    CBG: Recent Labs  Lab 10/03/20 1625 10/03/20 2057 10/04/20 0728 10/04/20 1130  GLUCAP 191* 222* 167* 173*     Recent Results (from the past 240 hour(s))  Blood Culture (routine x 2)     Status: None (Preliminary result)   Collection Time: 09/30/20  3:43 PM   Specimen: BLOOD  Result Value Ref Range Status   Specimen Description   Final    BLOOD LEFT ANTECUBITAL Performed at [facility], [place] [address], [place], [place] [address]    Special Requests   Final    BOTTLES DRAWN AEROBIC AND ANAEROBIC Blood Culture results may not be optimal due to an excessive volume of blood  received in culture bottles Performed at [facility] [address] [place], [place] [address]    Culture   Final    NO GROWTH 4 DAYS Performed at [facility], [place] [address] [place], [place] [address]    Report Status PENDING  Incomplete  Blood Culture (routine x 2)     Status: None (Preliminary result)   Collection Time: 09/30/20  3:45  PM   Specimen: BLOOD  Result Value Ref Range Status   Specimen Description   Final    BLOOD RIGHT ANTECUBITAL Performed at [facility] [address], [place], [place] [address]    Special Requests   Final    BOTTLES DRAWN AEROBIC AND ANAEROBIC Blood Culture adequate volume Performed at [facility] [address], [place], [place] [address]    Culture   Final    NO GROWTH 4 DAYS Performed at [facility], [place] [address], [place], [place] [address]    Report Status PENDING  Incomplete         Radiology Studies: DG CHEST PORT 1 VIEW  Result Date: 10/03/2020 CLINICAL DATA:  Increasing shortness of breath.  COVID positive. EXAM: PORTABLE CHEST 1 VIEW COMPARISON:  09/30/2020 FINDINGS: The exam is essentially stable with hazy bilateral airspace opacities. No pneumothorax. No large pleural effusion. The heart size is stable. There is no acute osseous abnormality. Atherosclerotic changes are noted of the thoracic aorta. IMPRESSION: No significant interval change. Electronically Signed   By: Constance Holster M.D.   On: 10/03/2020 19:28   VAS [place] LOWER EXTREMITY VENOUS (DVT)  Result Date: 10/04/2020  Lower Venous DVT Study Indications: Elevated Ddimer.  Risk Factors: COVID 19 positive. Comparison Study: No prior studies. Performing Technologist: Oliver Hum RVT  Examination Guidelines: Alvia Jablonski complete evaluation includes B-mode imaging, spectral Doppler, color Doppler, and power Doppler as needed of all accessible portions of each vessel. Bilateral testing is considered an  integral part of Jameil Whitmoyer complete examination. Limited examinations for reoccurring indications may be performed as noted. The reflux portion of the exam is performed with the patient in reverse Trendelenburg.  +---------+---------------+---------+-----------+----------+--------------+ RIGHT    CompressibilityPhasicitySpontaneityPropertiesThrombus Aging +---------+---------------+---------+-----------+----------+--------------+ CFV      Full           Yes      Yes                                 +---------+---------------+---------+-----------+----------+--------------+ SFJ      Full                                                        +---------+---------------+---------+-----------+----------+--------------+ FV Prox  Full                                                        +---------+---------------+---------+-----------+----------+--------------+ FV Mid   Full                                                        +---------+---------------+---------+-----------+----------+--------------+ FV DistalFull                                                        +---------+---------------+---------+-----------+----------+--------------+  PFV      Full                                                        +---------+---------------+---------+-----------+----------+--------------+ POP      Full           Yes      Yes                                 +---------+---------------+---------+-----------+----------+--------------+ PTV      Full                                                        +---------+---------------+---------+-----------+----------+--------------+ PERO     Full                                                        +---------+---------------+---------+-----------+----------+--------------+   +---------+---------------+---------+-----------+----------+--------------+ LEFT     CompressibilityPhasicitySpontaneityPropertiesThrombus  Aging +---------+---------------+---------+-----------+----------+--------------+ CFV      Full           Yes      Yes                                 +---------+---------------+---------+-----------+----------+--------------+ SFJ      Full                                                        +---------+---------------+---------+-----------+----------+--------------+ FV Prox  Full                                                        +---------+---------------+---------+-----------+----------+--------------+ FV Mid   Full                                                        +---------+---------------+---------+-----------+----------+--------------+ FV DistalFull                                                        +---------+---------------+---------+-----------+----------+--------------+ PFV      Full                                                        +---------+---------------+---------+-----------+----------+--------------+  POP      Full           Yes      Yes                                 +---------+---------------+---------+-----------+----------+--------------+ PTV      Full                                                        +---------+---------------+---------+-----------+----------+--------------+ PERO     Full                                                        +---------+---------------+---------+-----------+----------+--------------+     Summary: RIGHT: - There is no evidence of deep vein thrombosis in the lower extremity.  - No cystic structure found in the popliteal fossa.  LEFT: - There is no evidence of deep vein thrombosis in the lower extremity.  - No cystic structure found in the popliteal fossa.  *See table(s) above for measurements and observations.    Preliminary         Scheduled Meds: . vitamin C  500 mg Oral Daily  . aspirin EC  81 mg Oral Daily  . baricitinib  4 mg Oral Daily  . buPROPion  150 mg  Oral Daily  . docusate sodium  100 mg Oral BID  . enoxaparin (LOVENOX) injection  40 mg Subcutaneous Q24H  . feeding supplement (ENSURE ENLIVE)  237 mL Oral BID BM  . fluticasone furoate-vilanterol  1 puff Inhalation Daily  . insulin aspart  0-9 Units Subcutaneous TID WC  . levothyroxine  50 mcg Oral QAC breakfast  . methylPREDNISolone (SOLU-MEDROL) injection  40 mg Intravenous Q12H  . multivitamin with minerals  1 tablet Oral Daily  . polyethylene glycol  17 g Oral Daily  . umeclidinium bromide  1 puff Inhalation Daily  . zinc sulfate  220 mg Oral Daily   Continuous Infusions:    LOS: 4 days    Time spent: over 30 min    Fayrene Helper, MD [facility]   To contact the attending provider between 7A-7P or the covering provider during after hours 7P-7A, please log into the web site [email or web address] and access using universal  password for that web site. If you do not have the password, please call the hospital operator.  10/04/2020, 4:56 PM

## 2020-10-05 DIAGNOSIS — J1282 Pneumonia due to Coronavirus disease 2019: Secondary | ICD-10-CM | POA: Diagnosis not present

## 2020-10-05 DIAGNOSIS — U071 COVID-19: Secondary | ICD-10-CM | POA: Diagnosis not present

## 2020-10-05 LAB — CULTURE, BLOOD (ROUTINE X 2)
Culture: NO GROWTH
Culture: NO GROWTH
Special Requests: ADEQUATE

## 2020-10-05 LAB — CBC WITH DIFFERENTIAL/PLATELET
Abs Immature Granulocytes: 0.3 10*3/uL — ABNORMAL HIGH (ref 0.00–0.07)
Basophils Absolute: 0 10*3/uL (ref 0.0–0.1)
Basophils Relative: 0 %
Eosinophils Absolute: 0 10*3/uL (ref 0.0–0.5)
Eosinophils Relative: 0 %
HCT: 34.7 % — ABNORMAL LOW (ref 36.0–46.0)
Hemoglobin: 11.6 g/dL — ABNORMAL LOW (ref 12.0–15.0)
Immature Granulocytes: 3 %
Lymphocytes Relative: 11 %
Lymphs Abs: 1.1 10*3/uL (ref 0.7–4.0)
MCH: 28.3 pg (ref 26.0–34.0)
MCHC: 33.4 g/dL (ref 30.0–36.0)
MCV: 84.6 fL (ref 80.0–100.0)
Monocytes Absolute: 1.5 10*3/uL — ABNORMAL HIGH (ref 0.1–1.0)
Monocytes Relative: 15 %
Neutro Abs: 6.9 10*3/uL (ref 1.7–7.7)
Neutrophils Relative %: 71 %
Platelets: 379 10*3/uL (ref 150–400)
RBC: 4.1 MIL/uL (ref 3.87–5.11)
RDW: 13.2 % (ref 11.5–15.5)
WBC: 9.8 10*3/uL (ref 4.0–10.5)
nRBC: 0 % (ref 0.0–0.2)

## 2020-10-05 LAB — COMPREHENSIVE METABOLIC PANEL
ALT: 58 U/L — ABNORMAL HIGH (ref 0–44)
AST: 40 U/L (ref 15–41)
Albumin: 2.8 g/dL — ABNORMAL LOW (ref 3.5–5.0)
Alkaline Phosphatase: 95 U/L (ref 38–126)
Anion gap: 10 (ref 5–15)
BUN: 24 mg/dL — ABNORMAL HIGH (ref 8–23)
CO2: 26 mmol/L (ref 22–32)
Calcium: 8.7 mg/dL — ABNORMAL LOW (ref 8.9–10.3)
Chloride: 102 mmol/L (ref 98–111)
Creatinine, Ser: 0.72 mg/dL (ref 0.44–1.00)
GFR, Estimated: >60 mL/min (ref >60)
Glucose, Bld: 172 mg/dL — ABNORMAL HIGH (ref 70–99)
Potassium: 3.9 mmol/L (ref 3.5–5.1)
Sodium: 138 mmol/L (ref 135–145)
Total Bilirubin: 0.7 mg/dL (ref 0.3–1.2)
Total Protein: 5.9 g/dL — ABNORMAL LOW (ref 6.5–8.1)

## 2020-10-05 LAB — MAGNESIUM: Magnesium: 2.3 mg/dL (ref 1.7–2.4)

## 2020-10-05 LAB — C-REACTIVE PROTEIN: CRP: 0.5 mg/dL (ref <1.0)

## 2020-10-05 LAB — GLUCOSE, CAPILLARY
Glucose-Capillary: 210 mg/dL — ABNORMAL HIGH (ref 70–99)
Glucose-Capillary: 165 mg/dL — ABNORMAL HIGH (ref 70–99)
Glucose-Capillary: 104 mg/dL — ABNORMAL HIGH (ref 70–99)
Glucose-Capillary: 154 mg/dL — ABNORMAL HIGH (ref 70–99)
Glucose-Capillary: 181 mg/dL — ABNORMAL HIGH (ref 70–99)

## 2020-10-05 LAB — BRAIN NATRIURETIC PEPTIDE: B Natriuretic Peptide: 101.8 pg/mL — ABNORMAL HIGH (ref 0.0–100.0)

## 2020-10-05 LAB — FERRITIN: Ferritin: 651 ng/mL — ABNORMAL HIGH (ref 11–307)

## 2020-10-05 LAB — D-DIMER, QUANTITATIVE: D-Dimer, Quant: 0.65 ug/mL-FEU — ABNORMAL HIGH (ref 0.00–0.50)

## 2020-10-05 LAB — PHOSPHORUS: Phosphorus: 4.2 mg/dL (ref 2.5–4.6)

## 2020-10-05 MED ORDER — LIP MEDEX EX OINT
TOPICAL_OINTMENT | CUTANEOUS | Status: DC | PRN
  Filled 2020-10-05: qty 7

## 2020-10-05 NOTE — Progress Notes (Signed)
PROGRESS NOTE    Jean Shaffer  WNI:[id number] DOB: Mar 18, 1951 DOA: 09/30/2020 PCP: Jean Mires, MD   Chief Complaint  Patient presents with  . Shortness of Breath   Brief Narrative: Jean Shaffer is Jean Shaffer 69 y.o. female with Past medical history of asthma and COPD, chronic back pain, HTN, hypothyroidism. Patient presented with complaints of cough and shortness of breath.  Patient started with symptoms on September 23, 2020.  Tested at [facility] facility.  Presented 10/7 for monoclonal antibody infusion but was found hypoxic and therefore was transferred to ER for further work-up.  She's been admitted for COVID 19 pneumonia.   Assessment & Plan:   Active Problems:   Pneumonia due to COVID-19 virus   Acute respiratory failure with hypoxia (HCC)  Acute Hypoxic Respiratory Failure 2/2 COVID 19 Pneumonia CXR with patchy bilateral opacities Repeat CXR 10/10 Worsening O2 needs, requiring 10 L today, she's still significantly SOB with exertion Continue steroids and remdesivir.  Continue baricitinib. Low suspicion for bacterial infection, procal <0.1 Strict I/O, daily weights Prone as able, OOB, IS, flutter valve, therapy.  COVID-19 Labs  Recent Labs    10/03/20 0442 10/04/20 0442 10/05/20 0427  DDIMER 0.61* 0.55* 0.65*  FERRITIN  --   --  651*  CRP 0.6 0.6 0.5    Lab Results  Component Value Date   SARSCOV2NAA NEGATIVE 01/26/2020   [place] NEGATIVE 12/27/2019   Elevated LFTs: 2/2 covid, follow acute hepatitis panel (negative)  Leukopenia: improved  COPD  Continue inhalers and steroids No wheezing suggestive of exacerbation on my exam today   Hypothyroidism Continue synthroid  Essential Hypertension BP meds on hold   Chronic Back Pain norco prn  Depression Wellbutrin  DVT prophylaxis: lovenox Code Status: full  Family Communication: discussed with son 10/11 Disposition:   Status is: Inpatient  Remains inpatient appropriate because:Inpatient  level of care appropriate due to severity of illness   Dispo: The patient is from: Home              Anticipated d/c is to: pending              Anticipated d/c date is: > 3 days              Patient currently is not medically stable to d/c.  Consultants:   none  Procedures:   none  Antimicrobials:  Anti-infectives (From admission, onward)   Start     Dose/Rate Route Frequency Ordered Stop   10/01/20 1000  remdesivir 100 mg in sodium chloride 0.9 % 100 mL IVPB       "Followed by" Linked Group Details   100 mg 200 mL/hr over 30 Minutes Intravenous Daily 09/30/20 1633 10/04/20 1030   10/01/20 1000  remdesivir 100 mg in sodium chloride 0.9 % 100 mL IVPB  Status:  Discontinued       "Followed by" Linked Group Details   100 mg 200 mL/hr over 30 Minutes Intravenous Daily 09/30/20 1947 09/30/20 1951   09/30/20 2000  remdesivir 200 mg in sodium chloride 0.9% 250 mL IVPB  Status:  Discontinued       "Followed by" Linked Group Details   200 mg 580 mL/hr over 30 Minutes Intravenous Once 09/30/20 1947 09/30/20 1951   09/30/20 1800  remdesivir 200 mg in sodium chloride 0.9% 250 mL IVPB       "Followed by" Linked Group Details   200 mg 580 mL/hr over 30 Minutes Intravenous Once 09/30/20 1633 09/30/20 1819  Subjective: No new complaints, feels about the same  Objective: Vitals:   10/04/20 2202 10/05/20 0502 10/05/20 0800 10/05/20 1407  BP: (!) 151/67 (!) 151/72  (!) 124/56  Pulse: 80 80  71  Resp: (!) 21 18  20   Temp: 97.9 F (36.6 C) 98.3 F (36.8 C)  98.4 F (36.9 C)  TempSrc: Oral     SpO2: (S) (!) 89% 93% 91% 92%  Weight:      Height:        Intake/Output Summary (Last 24 hours) at 10/05/2020 1438 Last data filed at 10/04/2020 1700 Gross per 24 hour  Intake 120 ml  Output --  Net 120 ml   Filed Weights   10/02/20 0451 10/03/20 0422 10/04/20 0457  Weight: 64.5 kg 64.1 kg 62.4 kg    Examination:  General: No acute distress. Cardiovascular: Heart sounds  show Jean Shaffer regular rate, and rhythm Lungs: Clear to auscultation bilaterally  Abdomen: Soft, nontender, nondistended  Neurological: Alert and oriented 3. Moves all extremities 4. Cranial nerves II through XII grossly intact. Skin: Warm and dry. No rashes or lesions. Extremities: No clubbing or cyanosis. No edema.   Data Reviewed: I have personally reviewed following labs and imaging studies  CBC: Recent Labs  Lab 10/01/20 0418 10/02/20 0431 10/03/20 0442 10/04/20 0442 10/05/20 0427  WBC 1.8* 6.1 5.9 5.9 9.8  NEUTROABS 1.1* 4.4 3.9 4.0 6.9  HGB 12.3 12.5 11.9* 11.9* 11.6*  HCT 37.5 38.0 36.1 35.9* 34.7*  MCV 86.0 87.2 86.4 86.1 84.6  PLT 170 225 303 337 166    Basic Metabolic Panel: Recent Labs  Lab 09/30/20 1543 10/02/20 0431 10/03/20 0442 10/04/20 0442 10/05/20 0427  NA 136 137 135 137 138  K 3.4* 4.1 3.9 4.2 3.9  CL 96* 99 99 100 102  CO2 26 26 27 28 26   GLUCOSE 109* 138* 159* 171* 172*  BUN 12 22 20 21  24*  CREATININE 0.91 0.66 0.64 0.64 0.72  CALCIUM 8.7* 9.2 8.6* 8.7* 8.7*  MG  --  2.2 2.1 2.2 2.3  PHOS  --  2.8 3.9 4.3 4.2    GFR: Estimated Creatinine Clearance: 57.3 mL/min (by C-G formula based on SCr of 0.72 mg/dL).  Liver Function Tests: Recent Labs  Lab 09/30/20 1543 10/02/20 0431 10/03/20 0442 10/04/20 0442 10/05/20 0427  AST 111* 119* 79* 51* 40  ALT 60* 82* 71* 65* 58*  ALKPHOS 104 114 103 95 95  BILITOT 0.6 0.5 0.5 0.7 0.7  PROT 7.3 6.3* 6.2* 6.1* 5.9*  ALBUMIN 3.4* 3.1* 2.9* 2.9* 2.8*    CBG: Recent Labs  Lab 10/04/20 1130 10/04/20 1607 10/04/20 2011 10/05/20 0720 10/05/20 1108  GLUCAP 173* 210* 165* 104* 154*     Recent Results (from the past 240 hour(s))  Blood Culture (routine x 2)     Status: None   Collection Time: 09/30/20  3:43 PM   Specimen: BLOOD  Result Value Ref Range Status   Specimen Description   Final    BLOOD LEFT ANTECUBITAL Performed at [facility], [place] [address], [place],  [place] [address]    Special Requests   Final    BOTTLES DRAWN AEROBIC AND ANAEROBIC Blood Culture results may not be optimal due to an excessive volume of blood received in culture bottles Performed at [facility] [address], [place], [place] [address]    Culture   Final    NO GROWTH 5 DAYS Performed at [facility],  [address] [place], [place] [address]    Report Status 10/05/2020 FINAL  Final  Blood Culture (routine x 2)     Status: None   Collection Time: 09/30/20  3:45 PM   Specimen: BLOOD  Result Value Ref Range Status   Specimen Description   Final    BLOOD RIGHT ANTECUBITAL Performed at [facility] [address] [place], [place] [address]    Special Requests   Final    BOTTLES DRAWN AEROBIC AND ANAEROBIC Blood Culture adequate volume Performed at [facility] [address] [place], [place] [address]    Culture   Final    NO GROWTH 5 DAYS Performed at [facility], [place] [address] [place], [place] [address]    Report Status 10/05/2020 FINAL  Final         Radiology Studies: VAS [place] LOWER EXTREMITY VENOUS (DVT)  Result Date: 10/04/2020  Lower Venous DVT Study Indications: Elevated Ddimer.  Risk Factors: COVID 19 positive. Comparison Study: No prior studies. Performing Technologist: Oliver Hum RVT  Examination Guidelines: Kennie Karapetian complete evaluation includes B-mode imaging, spectral Doppler, color Doppler, and power Doppler as needed of all accessible portions of each vessel. Bilateral testing is considered an integral part of Kyree Adriano complete examination. Limited examinations for reoccurring indications may be performed as noted. The reflux portion of the exam is performed with the patient in reverse Trendelenburg.  +---------+---------------+---------+-----------+----------+--------------+ RIGHT    CompressibilityPhasicitySpontaneityPropertiesThrombus Aging  +---------+---------------+---------+-----------+----------+--------------+ CFV      Full           Yes      Yes                                 +---------+---------------+---------+-----------+----------+--------------+ SFJ      Full                                                        +---------+---------------+---------+-----------+----------+--------------+ FV Prox  Full                                                        +---------+---------------+---------+-----------+----------+--------------+ FV Mid   Full                                                        +---------+---------------+---------+-----------+----------+--------------+ FV DistalFull                                                        +---------+---------------+---------+-----------+----------+--------------+ PFV      Full                                                        +---------+---------------+---------+-----------+----------+--------------+  POP      Full           Yes      Yes                                 +---------+---------------+---------+-----------+----------+--------------+ PTV      Full                                                        +---------+---------------+---------+-----------+----------+--------------+ PERO     Full                                                        +---------+---------------+---------+-----------+----------+--------------+   +---------+---------------+---------+-----------+----------+--------------+ LEFT     CompressibilityPhasicitySpontaneityPropertiesThrombus Aging +---------+---------------+---------+-----------+----------+--------------+ CFV      Full           Yes      Yes                                 +---------+---------------+---------+-----------+----------+--------------+ SFJ      Full                                                         +---------+---------------+---------+-----------+----------+--------------+ FV Prox  Full                                                        +---------+---------------+---------+-----------+----------+--------------+ FV Mid   Full                                                        +---------+---------------+---------+-----------+----------+--------------+ FV DistalFull                                                        +---------+---------------+---------+-----------+----------+--------------+ PFV      Full                                                        +---------+---------------+---------+-----------+----------+--------------+ POP      Full           Yes      Yes                                 +---------+---------------+---------+-----------+----------+--------------+  PTV      Full                                                        +---------+---------------+---------+-----------+----------+--------------+ PERO     Full                                                        +---------+---------------+---------+-----------+----------+--------------+     Summary: RIGHT: - There is no evidence of deep vein thrombosis in the lower extremity.  - No cystic structure found in the popliteal fossa.  LEFT: - There is no evidence of deep vein thrombosis in the lower extremity.  - No cystic structure found in the popliteal fossa.  *See table(s) above for measurements and observations. Electronically signed by Monica Martinez MD on 10/04/2020 at 5:22:50 PM.    Final         Scheduled Meds: . vitamin C  500 mg Oral Daily  . aspirin EC  81 mg Oral Daily  . baricitinib  4 mg Oral Daily  . buPROPion  150 mg Oral Daily  . docusate sodium  100 mg Oral BID  . enoxaparin (LOVENOX) injection  40 mg Subcutaneous Q24H  . feeding supplement (ENSURE ENLIVE)  237 mL Oral BID BM  . fluticasone furoate-vilanterol  1 puff Inhalation Daily  . insulin  aspart  0-9 Units Subcutaneous TID WC  . levothyroxine  50 mcg Oral QAC breakfast  . methylPREDNISolone (SOLU-MEDROL) injection  40 mg Intravenous Q12H  . multivitamin with minerals  1 tablet Oral Daily  . polyethylene glycol  17 g Oral Daily  . umeclidinium bromide  1 puff Inhalation Daily  . zinc sulfate  220 mg Oral Daily   Continuous Infusions:    LOS: 5 days    Time spent: over 30 min    Fayrene Helper, MD [facility]   To contact the attending provider between 7A-7P or the covering provider during after hours 7P-7A, please log into the web site [email or web address] and access using universal [place] password for that web site. If you do not have the password, please call the hospital operator.  10/05/2020, 2:38 PM

## 2020-10-05 NOTE — Progress Notes (Signed)
Occupational Therapy Treatment Patient Details Name: Jean Shaffer MRN: [id number] DOB: 11/18/1951 Today's Date: 10/05/2020    History of present illness Jean Shaffer is a 69 y.o. female with Past medical history of asthma and COPD, chronic back pain, HTN, hypothyroidism. Patient presented with complaints of cough and shortness of breath.  Patient started with symptoms on September 23, 2020. Admitted to hospital with COVID pneumonia.   OT comments  Patient participating well with OT, but unable to demonstrate observable progress towads goals today due to quickness to fatigue with SpO2 as low as 81% on 10L via HFNC after t/f EOB<>BSC. Pt recovers after ~3-4 min to 90-95% while supine and performing PLB.  Patient limited by generalized weakness and impaired activity tolerance along with deficits noted below. Pt continues to demonstrate good rehab potential and would benefit from continued skilled OT to increase safety and independence with ADLs and functional transfers to allow pt to return home safely and reduce caregiver burden and fall risk.   Follow Up Recommendations  Home health OT    Equipment Recommendations  Tub/shower seat    Recommendations for Other Services      Precautions / Restrictions Precautions Precautions: Fall Precaution Comments: monitor sats Restrictions Weight Bearing Restrictions: No       Mobility Bed Mobility Overal bed mobility: Needs Assistance Bed Mobility: Supine to Sit     Supine to sit: Supervision Sit to supine: Supervision   General bed mobility comments: Increased time, verbal cues for pacing.  Transfers Overall transfer level: Needs assistance Equipment used: 1 person hand held assist Transfers: Sit to/from [facility] Sit to Stand: Supervision Stand pivot transfers: Min guard       General transfer comment: Unsteady. Increased time and effort, requires close SpO2 monitoring.    Balance Overall balance  assessment: Needs assistance Sitting-balance support: Feet supported;Single extremity supported Sitting balance-Leahy Scale: Good     Standing balance support: No upper extremity supported;During functional activity Standing balance-Leahy Scale: Poor Standing balance comment: fall risk                           ADL either performed or assessed with clinical judgement   ADL Overall ADL's : Needs assistance/impaired     Grooming: Set up;Bed level;Wash/dry hands Grooming Details (indicate cue type and reason): After toileting pt unable to tolerate sitting at EOB for grooming. Required full setup at bed level.                 Toilet Transfer: Min Statistician Details (indicate cue type and reason): Increased effort with SpO2 sat drop to 82% on 10L via HFNC. Coached in PLB throughout. Toileting- Clothing Manipulation and Hygiene: Sitting/lateral lean;Sit to/from stand;Supervision/safety Toileting - Clothing Manipulation Details (indicate cue type and reason): Increased effort with Sats in mid to low 80s. Pt coached in pacing activity and resting as needed.     Functional mobility during ADLs: Min guard General ADL Comments: Min guard for steadying when untangling self from tele wires and perform 180 degree turns.     Vision Baseline Vision/History: Wears glasses Wears Glasses: Reading only Patient Visual Report: No change from baseline     Perception     Praxis      Cognition Arousal/Alertness: Awake/alert Behavior During Therapy: Anxious;Flat affect Overall Cognitive Status: Within Functional Limits for tasks assessed  General Comments      Pertinent Vitals/ Pain       Pain Assessment: No/denies pain  Home Living                                          Prior Functioning/Environment              Frequency  Min 2X/week         Progress Toward Goals  OT Goals(current goals can now be found in the care plan section)  Progress towards OT goals: Not progressing toward goals - comment (Pt is requiring increased supplimental O2 from 8-9L on 10/11 to 10L today via HFNC and continues to desat to 81-82% with light activity after BSC t/f.)  Acute Rehab OT Goals Patient Stated Goal: to feel better OT Goal Formulation: With patient Time For Goal Achievement: 10/16/20 Potential to Achieve Goals: Good  Plan Discharge plan remains appropriate    Co-evaluation                 AM-PAC OT "6 Clicks" Daily Activity     Outcome Measure   Help from another person eating meals?: None Help from another person taking care of personal grooming?: A Little Help from another person toileting, which includes using toliet, bedpan, or urinal?: A Little Help from another person bathing (including washing, rinsing, drying)?: A Little Help from another person to put on and taking off regular upper body clothing?: A Little Help from another person to put on and taking off regular lower body clothing?: A Little 6 Click Score: 19    End of Session Equipment Utilized During Treatment: Oxygen  OT Visit Diagnosis: Muscle weakness (generalized) (M62.81)   Activity Tolerance Patient limited by fatigue   Patient Left in bed;with call bell/phone within reach;with nursing/sitter in room   Nurse Communication  [facility] phone in pt's room broken. Pt unable to order lunch. Nurse stated plan to get new phone.)        Time: [phone] OT Time Calculation (min): 28 min  Charges: OT General Charges $OT Visit: 1 Visit OT Treatments $Self Care/Home Management : 8-22 mins $Therapeutic Activity: 8-22 mins  Anderson Malta, OT Acute Rehab Services Office: [phone] 10/05/2020    Julien Girt 10/05/2020, 10:42 AM

## 2020-10-06 ENCOUNTER — Inpatient Hospital Stay (HOSPITAL_COMMUNITY): Payer: Medicare Other

## 2020-10-06 DIAGNOSIS — J9601 Acute respiratory failure with hypoxia: Secondary | ICD-10-CM | POA: Diagnosis not present

## 2020-10-06 LAB — COMPREHENSIVE METABOLIC PANEL
ALT: 47 U/L — ABNORMAL HIGH (ref 0–44)
AST: 26 U/L (ref 15–41)
Albumin: 2.8 g/dL — ABNORMAL LOW (ref 3.5–5.0)
Alkaline Phosphatase: 91 U/L (ref 38–126)
Anion gap: 10 (ref 5–15)
BUN: 24 mg/dL — ABNORMAL HIGH (ref 8–23)
CO2: 26 mmol/L (ref 22–32)
Calcium: 9.6 mg/dL (ref 8.9–10.3)
Chloride: 105 mmol/L (ref 98–111)
Creatinine, Ser: 0.72 mg/dL (ref 0.44–1.00)
GFR, Estimated: >60 mL/min (ref >60)
Glucose, Bld: 113 mg/dL — ABNORMAL HIGH (ref 70–99)
Potassium: 3.9 mmol/L (ref 3.5–5.1)
Sodium: 141 mmol/L (ref 135–145)
Total Bilirubin: 0.6 mg/dL (ref 0.3–1.2)
Total Protein: 6 g/dL — ABNORMAL LOW (ref 6.5–8.1)

## 2020-10-06 LAB — CBC WITH DIFFERENTIAL/PLATELET
Abs Immature Granulocytes: 0.62 10*3/uL — ABNORMAL HIGH (ref 0.00–0.07)
Basophils Absolute: 0.1 10*3/uL (ref 0.0–0.1)
Basophils Relative: 0 %
Eosinophils Absolute: 0 10*3/uL (ref 0.0–0.5)
Eosinophils Relative: 0 %
HCT: 36.4 % (ref 36.0–46.0)
Hemoglobin: 12.1 g/dL (ref 12.0–15.0)
Immature Granulocytes: 4 %
Lymphocytes Relative: 11 %
Lymphs Abs: 1.9 10*3/uL (ref 0.7–4.0)
MCH: 28.6 pg (ref 26.0–34.0)
MCHC: 33.2 g/dL (ref 30.0–36.0)
MCV: 86.1 fL (ref 80.0–100.0)
Monocytes Absolute: 1.6 10*3/uL — ABNORMAL HIGH (ref 0.1–1.0)
Monocytes Relative: 9 %
Neutro Abs: 13.7 10*3/uL — ABNORMAL HIGH (ref 1.7–7.7)
Neutrophils Relative %: 76 %
Platelets: 510 10*3/uL — ABNORMAL HIGH (ref 150–400)
RBC: 4.23 MIL/uL (ref 3.87–5.11)
RDW: 13.3 % (ref 11.5–15.5)
WBC: 17.9 10*3/uL — ABNORMAL HIGH (ref 4.0–10.5)
nRBC: 0 % (ref 0.0–0.2)

## 2020-10-06 LAB — GLUCOSE, CAPILLARY
Glucose-Capillary: 170 mg/dL — ABNORMAL HIGH (ref 70–99)
Glucose-Capillary: 104 mg/dL — ABNORMAL HIGH (ref 70–99)
Glucose-Capillary: 200 mg/dL — ABNORMAL HIGH (ref 70–99)
Glucose-Capillary: 140 mg/dL — ABNORMAL HIGH (ref 70–99)

## 2020-10-06 LAB — MAGNESIUM: Magnesium: 2.4 mg/dL (ref 1.7–2.4)

## 2020-10-06 LAB — PHOSPHORUS: Phosphorus: 5.1 mg/dL — ABNORMAL HIGH (ref 2.5–4.6)

## 2020-10-06 LAB — BRAIN NATRIURETIC PEPTIDE: B Natriuretic Peptide: 88.8 pg/mL (ref 0.0–100.0)

## 2020-10-06 LAB — D-DIMER, QUANTITATIVE: D-Dimer, Quant: 0.95 ug/mL-FEU — ABNORMAL HIGH (ref 0.00–0.50)

## 2020-10-06 MED ORDER — HYDROXYZINE HCL 25 MG PO TABS
25.0000 mg | ORAL_TABLET | Freq: Three times a day (TID) | ORAL | Status: DC | PRN
  Administered 2020-10-06 – 2020-10-24 (×9): 25 mg via ORAL
  Filled 2020-10-06 (×10): qty 1

## 2020-10-06 NOTE — Progress Notes (Signed)
Patient participated in care this morning. Sat up in chair. Required o2 increase to 10L due to shallow breathing, stating she felt anxious. Pt currently in chair with a small goal to sit up and eat breakfast in chair. o2 sats 89-93%

## 2020-10-06 NOTE — Progress Notes (Signed)
PROGRESS NOTE    Jean Shaffer  MWU:[id number] DOB: 1951/03/20 DOA: 09/30/2020 PCP: [name], MD   Chief Complaint  Patient presents with  . Shortness of Breath   Brief Narrative: Jean Shaffer is Jean Shaffer 69 y.o. female with Past medical history of asthma and COPD, chronic back pain, HTN, hypothyroidism. Patient presented with complaints of cough and shortness of breath.  Patient started with symptoms on September 23, 2020.  Tested at [facility] facility.  Presented 10/7 for monoclonal antibody infusion but was found hypoxic and therefore was transferred to ER for further work-up.  She's been admitted for COVID 19 pneumonia.  She's on HFNC at 10 L with relatively slow improvement on steroids and baricitinib (s/p remdesivir).  Expect her to required several more days - week + of hospitalization with slow improvement.   Assessment & Plan:   Active Problems:   Pneumonia due to COVID-19 virus   Acute respiratory failure with hypoxia (HCC)  Acute Hypoxic Respiratory Failure 2/2 COVID 19 Pneumonia CXR with patchy bilateral opacities Repeat CXR 10/13 with stable minimal RUL opacity requiring 10 L today, she's still significantly SOB with exertion Continue steroids and remdesivir.  Continue baricitinib. Low suspicion for bacterial infection, procal <0.1 Strict I/O, daily weights Prone as able, OOB, IS, flutter valve, therapy.  COVID-19 Labs  Recent Labs    10/04/20 0442 10/05/20 0427 10/06/20 0441  DDIMER 0.55* 0.65* 0.95*  FERRITIN  --  651*  --   CRP 0.6 0.5  --     Lab Results  Component Value Date   SARSCOV2NAA NEGATIVE 01/26/2020   [place] NEGATIVE 12/27/2019   Elevated LFTs: 2/2 covid, follow acute hepatitis panel (negative)  Leukopenia: improved - now leukocytosis likely 2/2 steroids  COPD  Continue inhalers and steroids No wheezing suggestive of exacerbation on my exam today   Hypothyroidism Continue synthroid  Essential Hypertension BP meds on hold     Chronic Back Pain norco prn  Depression Wellbutrin  DVT prophylaxis: lovenox Code Status: full  Family Communication: discussed with son 10/11 Disposition:   Status is: Inpatient  Remains inpatient appropriate because:Inpatient level of care appropriate due to severity of illness   Dispo: The patient is from: Home              Anticipated d/c is to: pending              Anticipated d/c date is: > 3 days              Patient currently is not medically stable to d/c.  Consultants:   none  Procedures:   none  Antimicrobials:  Anti-infectives (From admission, onward)   Start     Dose/Rate Route Frequency Ordered Stop   10/01/20 1000  remdesivir 100 mg in sodium chloride 0.9 % 100 mL IVPB       "Followed by" Linked Group Details   100 mg 200 mL/hr over 30 Minutes Intravenous Daily 09/30/20 1633 10/04/20 1030   10/01/20 1000  remdesivir 100 mg in sodium chloride 0.9 % 100 mL IVPB  Status:  Discontinued       "Followed by" Linked Group Details   100 mg 200 mL/hr over 30 Minutes Intravenous Daily 09/30/20 1947 09/30/20 1951   09/30/20 2000  remdesivir 200 mg in sodium chloride 0.9% 250 mL IVPB  Status:  Discontinued       "Followed by" Linked Group Details   200 mg 580 mL/hr over 30 Minutes Intravenous Once 09/30/20 1947  09/30/20 1951   09/30/20 1800  remdesivir 200 mg in sodium chloride 0.9% 250 mL IVPB       "Followed by" Linked Group Details   200 mg 580 mL/hr over 30 Minutes Intravenous Once 09/30/20 1633 09/30/20 1819     Subjective: No new complaints, maybe Jean Shaffer little better  Objective: Vitals:   10/05/20 1958 10/06/20 0353 10/06/20 0500 10/06/20 1323  BP: (!) 150/73 (!) 129/56  (!) 115/54  Pulse: 83 75  78  Resp: 15 15  20   Temp: 98.3 F (36.8 C) 98.6 F (37 C)  98.9 F (37.2 C)  TempSrc: Oral Oral  Oral  SpO2: 92% 90%  92%  Weight:   63.7 kg   Height:        Intake/Output Summary (Last 24 hours) at 10/06/2020 1733 Last data filed at 10/06/2020  0543 Gross per 24 hour  Intake 360 ml  Output 325 ml  Net 35 ml   Filed Weights   10/03/20 0422 10/04/20 0457 10/06/20 0500  Weight: 64.1 kg 62.4 kg 63.7 kg    Examination:  General: No acute distress. Cardiovascular: Heart sounds show Jean Shaffer regular rate, and rhythm Lungs: Clear to auscultation bilaterally  Abdomen: Soft, nontender, nondistended Neurological: Alert and oriented 3. Moves all extremities 4. Cranial nerves II through XII grossly intact. Skin: Warm and dry. No rashes or lesions. Extremities: No clubbing or cyanosis. No edema.   Data Reviewed: I have personally reviewed following labs and imaging studies  CBC: Recent Labs  Lab 10/02/20 0431 10/03/20 0442 10/04/20 0442 10/05/20 0427 10/06/20 0441  WBC 6.1 5.9 5.9 9.8 17.9*  NEUTROABS 4.4 3.9 4.0 6.9 13.7*  HGB 12.5 11.9* 11.9* 11.6* 12.1  HCT 38.0 36.1 35.9* 34.7* 36.4  MCV 87.2 86.4 86.1 84.6 86.1  PLT 225 303 337 379 510*    Basic Metabolic Panel: Recent Labs  Lab 10/02/20 0431 10/03/20 0442 10/04/20 0442 10/05/20 0427 10/06/20 0441  NA 137 135 137 138 141  K 4.1 3.9 4.2 3.9 3.9  CL 99 99 100 102 105  CO2 26 27 28 26 26   GLUCOSE 138* 159* 171* 172* 113*  BUN 22 20 21  24* 24*  CREATININE 0.66 0.64 0.64 0.72 0.72  CALCIUM 9.2 8.6* 8.7* 8.7* 9.6  MG 2.2 2.1 2.2 2.3 2.4  PHOS 2.8 3.9 4.3 4.2 5.1*    GFR: Estimated Creatinine Clearance: 57.3 mL/min (by C-G formula based on SCr of 0.72 mg/dL).  Liver Function Tests: Recent Labs  Lab 10/02/20 0431 10/03/20 0442 10/04/20 0442 10/05/20 0427 10/06/20 0441  AST 119* 79* 51* 40 26  ALT 82* 71* 65* 58* 47*  ALKPHOS 114 103 95 95 91  BILITOT 0.5 0.5 0.7 0.7 0.6  PROT 6.3* 6.2* 6.1* 5.9* 6.0*  ALBUMIN 3.1* 2.9* 2.9* 2.8* 2.8*    CBG: Recent Labs  Lab 10/05/20 1604 10/05/20 2125 10/06/20 0721 10/06/20 1123 10/06/20 1644  GLUCAP 181* 170* 104* 200* 140*     Recent Results (from the past 240 hour(s))  Blood Culture (routine x 2)      Status: None   Collection Time: 09/30/20  3:43 PM   Specimen: BLOOD  Result Value Ref Range Status   Specimen Description   Final    BLOOD LEFT ANTECUBITAL Performed at [facility], [place] [address], [place], [place] [address]    Special Requests   Final    BOTTLES DRAWN AEROBIC AND ANAEROBIC Blood Culture results may not be optimal due to an  excessive volume of blood received in culture bottles Performed at [facility] [address], [place], [place] [address]    Culture   Final    NO GROWTH 5 DAYS Performed at [facility], [place] [address], [place], [place] [address]    Report Status 10/05/2020 FINAL  Final  Blood Culture (routine x 2)     Status: None   Collection Time: 09/30/20  3:45 PM   Specimen: BLOOD  Result Value Ref Range Status   Specimen Description   Final    BLOOD RIGHT ANTECUBITAL Performed at [facility] [address] [place], [place] [address]    Special Requests   Final    BOTTLES DRAWN AEROBIC AND ANAEROBIC Blood Culture adequate volume Performed at [facility] [address], [place], [place] [address]    Culture   Final    NO GROWTH 5 DAYS Performed at [facility], [place] [address] [place], [place] [address]    Report Status 10/05/2020 FINAL  Final         Radiology Studies: DG CHEST PORT 1 VIEW  Result Date: 10/06/2020 CLINICAL DATA:  Shortness of breath. EXAM: PORTABLE CHEST 1 VIEW COMPARISON:  October 03, 2020. FINDINGS: The heart size and mediastinal contours are within normal limits. No pneumothorax or pleural effusion is noted. Left lung is clear. Stable minimal right upper lobe opacity is noted which may represent atelectasis, infiltrate or scarring. The visualized skeletal structures are unremarkable. IMPRESSION: Stable minimal right upper lobe opacity is noted which may represent atelectasis, infiltrate or scarring. Electronically Signed   By: Marijo Conception M.D.   On: 10/06/2020 08:19        Scheduled Meds: . vitamin C  500 mg Oral Daily  . aspirin EC  81 mg Oral Daily  . baricitinib  4 mg Oral Daily  . buPROPion  150 mg Oral Daily  . docusate sodium  100 mg Oral BID  . enoxaparin (LOVENOX) injection  40 mg Subcutaneous Q24H  . feeding supplement  237 mL Oral BID BM  . fluticasone furoate-vilanterol  1 puff Inhalation Daily  . insulin aspart  0-9 Units Subcutaneous TID WC  . levothyroxine  50 mcg Oral QAC breakfast  . methylPREDNISolone (SOLU-MEDROL) injection  40 mg Intravenous Q12H  . multivitamin with minerals  1 tablet Oral Daily  . polyethylene glycol  17 g Oral Daily  . umeclidinium bromide  1 puff Inhalation Daily  . zinc sulfate  220 mg Oral Daily   Continuous Infusions:    LOS: 6 days    Time spent: over 30 min    Fayrene Helper, MD [facility]   To contact the attending provider between 7A-7P or the covering provider during after hours 7P-7A, please log into the web site [email or web address] and access using universal [place] password for that web site. If you do not have the password, please call the hospital operator.  10/06/2020, 5:33 PM

## 2020-10-06 NOTE — Progress Notes (Signed)
Pt sitting up in reclining chair upset. Called for assistance.   Writer entered room to find pt fidgety in chair. When asked what she needed assistance with pt replies, "I can't sit up any longer. This is hurting my neck. You people are killing me."   Offered pt assistance back to bed in which she accepted. Standby assistance required but pt was able to stand and pivot to bed then lifted her own legs in bed. She became dyspneic with sats dropping to 75% but once back in bed did some deep breathing in her nose and out of her mouth until sats were up to 89%. She was on 10L high flow the entire time.   Pt calm and relaxed when writer left room.

## 2020-10-06 NOTE — Progress Notes (Signed)
Physical Therapy Treatment Patient Details Name: Jean Shaffer MRN: [id number] DOB: 1951-09-28 Today's Date: 10/06/2020    History of Present Illness Jean Shaffer is a 69 y.o. female with Past medical history of asthma and COPD, chronic back pain, HTN, hypothyroidism. Patient presented with complaints of cough and shortness of breath.  Patient started with symptoms on September 23, 2020. Admitted to hospital with COVID pneumonia.    PT Comments    Patient  Performed exercises for U/LEs using Tb. Resting SPO2  On 10 L HFNC 88, during mobility dropping to 78%( finger sensor). Patient encouraged PLB. Patient returns to 855 with resting. Continue progressive mobility as toleratd.  Follow Up Recommendations  Home health PT     Equipment Recommendations  None recommended by PT    Recommendations for Other Services       Precautions / Restrictions Precautions Precautions: Fall Precaution Comments: monitor sats    Mobility  Bed Mobility   Bed Mobility: Supine to Sit     Supine to sit: Supervision Sit to supine: Supervision      Transfers Overall transfer level: Needs assistance   Transfers: Sit to/from Stand;Stand Pivot Transfers Sit to Stand: Supervision         General transfer comment: from bed to [facility] and back, moves quickly  Ambulation/Gait                 Stairs             Wheelchair Mobility    Modified Rankin (Stroke Patients Only)       Balance Overall balance assessment: Needs assistance Sitting-balance support: Feet supported;Single extremity supported Sitting balance-Leahy Scale: Good     Standing balance support: No upper extremity supported;During functional activity Standing balance-Leahy Scale: Fair                              Cognition Arousal/Alertness: Awake/alert Behavior During Therapy: Anxious;Flat affect Overall Cognitive Status: Within Functional Limits for tasks assessed                                         Exercises Other Exercises Other Exercises: orange TB for UE's x 10 for shoulder flexion, extension abduction, elb flexion and extension Other Exercises: LAQ, hip flexion x 10 seated    General Comments        Pertinent Vitals/Pain Pain Assessment: No/denies pain    Home Living                      Prior Function            PT Goals (current goals can now be found in the care plan section) Progress towards PT goals: Progressing toward goals    Frequency    Min 3X/week      PT Plan Current plan remains appropriate    Co-evaluation              AM-PAC PT "6 Clicks" Mobility   Outcome Measure  Help needed turning from your back to your side while in a flat bed without using bedrails?: None Help needed moving from lying on your back to sitting on the side of a flat bed without using bedrails?: None Help needed moving to and from a bed to a chair (including a wheelchair)?: A Little Help needed standing  up from a chair using your arms (e.g., wheelchair or bedside chair)?: A Little Help needed to walk in hospital room?: A Lot Help needed climbing 3-5 steps with a railing? : A Lot 6 Click Score: 18    End of Session Equipment Utilized During Treatment: Oxygen Activity Tolerance: Patient limited by fatigue;Treatment limited secondary to medical complications (Comment) Patient left: in bed;with call bell/phone within reach;with bed alarm set Nurse Communication: Mobility status PT Visit Diagnosis: Difficulty in walking, not elsewhere classified (R26.2);Unsteadiness on feet (R26.81)     Time: 4239-5320 PT Time Calculation (min) (ACUTE ONLY): 14 min  Charges:  $Therapeutic Exercise: 8-22 mins                     Tresa Endo PT Acute Rehabilitation Services Pager [phone] Office [phone]    Claretha Cooper 10/06/2020, 4:35 PM

## 2020-10-07 DIAGNOSIS — E038 Other specified hypothyroidism: Secondary | ICD-10-CM | POA: Diagnosis not present

## 2020-10-07 DIAGNOSIS — R0602 Shortness of breath: Secondary | ICD-10-CM | POA: Diagnosis not present

## 2020-10-07 DIAGNOSIS — U071 COVID-19: Secondary | ICD-10-CM | POA: Diagnosis not present

## 2020-10-07 DIAGNOSIS — J9601 Acute respiratory failure with hypoxia: Secondary | ICD-10-CM | POA: Diagnosis not present

## 2020-10-07 LAB — CBC WITH DIFFERENTIAL/PLATELET
Abs Immature Granulocytes: 1.06 10*3/uL — ABNORMAL HIGH (ref 0.00–0.07)
Basophils Absolute: 0.1 10*3/uL (ref 0.0–0.1)
Basophils Relative: 0 %
Eosinophils Absolute: 0 10*3/uL (ref 0.0–0.5)
Eosinophils Relative: 0 %
HCT: 38 % (ref 36.0–46.0)
Hemoglobin: 12.4 g/dL (ref 12.0–15.0)
Immature Granulocytes: 4 %
Lymphocytes Relative: 7 %
Lymphs Abs: 1.7 10*3/uL (ref 0.7–4.0)
MCH: 28.5 pg (ref 26.0–34.0)
MCHC: 32.6 g/dL (ref 30.0–36.0)
MCV: 87.4 fL (ref 80.0–100.0)
Monocytes Absolute: 1.3 10*3/uL — ABNORMAL HIGH (ref 0.1–1.0)
Monocytes Relative: 5 %
Neutro Abs: 21.3 10*3/uL — ABNORMAL HIGH (ref 1.7–7.7)
Neutrophils Relative %: 84 %
Platelets: 495 10*3/uL — ABNORMAL HIGH (ref 150–400)
RBC: 4.35 MIL/uL (ref 3.87–5.11)
RDW: 13.5 % (ref 11.5–15.5)
WBC: 25.4 10*3/uL — ABNORMAL HIGH (ref 4.0–10.5)
nRBC: 0 % (ref 0.0–0.2)

## 2020-10-07 LAB — GLUCOSE, CAPILLARY
Glucose-Capillary: 204 mg/dL — ABNORMAL HIGH (ref 70–99)
Glucose-Capillary: 113 mg/dL — ABNORMAL HIGH (ref 70–99)
Glucose-Capillary: 144 mg/dL — ABNORMAL HIGH (ref 70–99)
Glucose-Capillary: 116 mg/dL — ABNORMAL HIGH (ref 70–99)
Glucose-Capillary: 140 mg/dL — ABNORMAL HIGH (ref 70–99)

## 2020-10-07 LAB — COMPREHENSIVE METABOLIC PANEL
ALT: 39 U/L (ref 0–44)
AST: 26 U/L (ref 15–41)
Albumin: 2.9 g/dL — ABNORMAL LOW (ref 3.5–5.0)
Alkaline Phosphatase: 82 U/L (ref 38–126)
Anion gap: 11 (ref 5–15)
BUN: 27 mg/dL — ABNORMAL HIGH (ref 8–23)
CO2: 25 mmol/L (ref 22–32)
Calcium: 9 mg/dL (ref 8.9–10.3)
Chloride: 102 mmol/L (ref 98–111)
Creatinine, Ser: 0.79 mg/dL (ref 0.44–1.00)
GFR, Estimated: >60 mL/min (ref >60)
Glucose, Bld: 88 mg/dL (ref 70–99)
Potassium: 4 mmol/L (ref 3.5–5.1)
Sodium: 138 mmol/L (ref 135–145)
Total Bilirubin: 0.5 mg/dL (ref 0.3–1.2)
Total Protein: 6.1 g/dL — ABNORMAL LOW (ref 6.5–8.1)

## 2020-10-07 LAB — MAGNESIUM: Magnesium: 2.3 mg/dL (ref 1.7–2.4)

## 2020-10-07 LAB — D-DIMER, QUANTITATIVE: D-Dimer, Quant: 0.59 ug/mL-FEU — ABNORMAL HIGH (ref 0.00–0.50)

## 2020-10-07 LAB — FERRITIN: Ferritin: 517 ng/mL — ABNORMAL HIGH (ref 11–307)

## 2020-10-07 LAB — PHOSPHORUS: Phosphorus: 4.9 mg/dL — ABNORMAL HIGH (ref 2.5–4.6)

## 2020-10-07 LAB — C-REACTIVE PROTEIN: CRP: 1.3 mg/dL — ABNORMAL HIGH (ref <1.0)

## 2020-10-07 NOTE — Plan of Care (Signed)
  Problem: Activity: Goal: Risk for activity intolerance will decrease Outcome: Progressing   Problem: Nutrition: Goal: Adequate nutrition will be maintained Outcome: Progressing   Problem: Pain Managment: Goal: General experience of comfort will improve Outcome: Progressing   Problem: Safety: Goal: Ability to remain free from injury will improve Outcome: Progressing   

## 2020-10-07 NOTE — Progress Notes (Signed)
10/07/2020  1330  NT help patient get bathe and sat her in the chair. Patient refused to sit in the chair. Encouraged patient to at least sit in chair for 30 mins, but patient refused. NT helped patient back to bed. Patient sat in chair for 2 minutes.

## 2020-10-07 NOTE — Progress Notes (Signed)
PHYSICAL THERAPY  Pt curled up in fetal position laying in bed "not now" declining any OOB activity.  Will cont to follow and attempt another day.   Rica Koyanagi  PTA Acute  Rehabilitation Services Pager      [phone] Office      [phone]

## 2020-10-07 NOTE — Progress Notes (Signed)
Pt was receptive to my encouragement of IS & flutter use. Got up to [facility] desatting to 70% &  Increased work of breathing. Sats slowly came back up to 85% with focused breathing. Pep talk provided by me to continue to be positive and involved in care progression in order to have overall improved respiratory capacity to get better. Pt feels she has had a good day and done what has been asked of her. States she gets cold sitting in the chair and that makes her bones ache. So next time she agreed to have a blanket across her shoulders like a cape/shawl.

## 2020-10-07 NOTE — Progress Notes (Signed)
Nutrition Follow-up  INTERVENTION:   -Ensure Enlive po BID, each supplement provides 350 kcal and 20 grams of protein -Multivitamin with minerals daily  NUTRITION DIAGNOSIS:   Increased nutrient needs related to acute illness (COVID-19 infection) as evidenced by estimated needs.  Ongoing.  GOAL:   Patient will meet greater than or equal to 90% of their needs  Progressing.  MONITOR:   PO intake, Supplement acceptance, Labs, Weight trends, I & O's  ASSESSMENT:   69 y.o. female with Past medical history of asthma and COPD, chronic back pain, HTN, hypothyroidism.Patient presented with complaints of cough and shortness of breath.  Patient started with symptoms on September 23, 2020.  Tested at [facility] facility.  Presented today for monoclonal antibody infusion but was found hypoxic and therefore was transferred to ER for further work-up. Admitted for COVID-19 viral pneumonia.  COVID+ since 9/30.  Patient currently consuming 25-50% of meals. Pt is drinking Ensure supplements.  Admission weight: 143 lbs. Current weight: 145 lbs.  Medications: Vitamin C, Colace, Multivitamin with minerals daily, Zinc sulfate  Labs reviewed: CBGs: 113-144 Elevated Phos Mg WNL  Diet Order:   Diet Order            DIET SOFT Room service appropriate? Yes; Fluid consistency: Thin  Diet effective now                 EDUCATION NEEDS:   No education needs have been identified at this time  Skin:  Skin Assessment: Reviewed RN Assessment  Last BM:  PTA  Height:   Ht Readings from Last 1 Encounters:  09/30/20 5\' 4"  (1.626 m)    Weight:   Wt Readings from Last 1 Encounters:  10/07/20 65.9 kg    BMI:  Body mass index is 24.94 kg/m.  Estimated Nutritional Needs:   Kcal:  1800-2000  Protein:  85-100g  Fluid:  2L/day   Clayton Bibles, MS, RD, LDN Inpatient Clinical Dietitian Contact information available via Amion

## 2020-10-07 NOTE — Progress Notes (Signed)
PROGRESS NOTE    Jean Shaffer  PJA:[id number] DOB: 1951-09-19 DOA: 09/30/2020 PCP: [name], MD     Brief Narrative:  Jean Shaffer a 69 y.o.WF PMHx asthma and COPD, chronic back pain, HTN, hypothyroidism.   Patient presented with complaints of cough and shortness of breath.Patient started with symptoms on September 23, 2020. Tested at [place] facility. Presented 10/7 for monoclonal antibody infusion but was found hypoxic and therefore was transferred to ER for further work-up.  She's been admitted for COVID 19 pneumonia.  She's on HFNC at 10 L with relatively slow improvement on steroids and baricitinib (s/p remdesivir).  Expect her to required several more days - week + of hospitalization with slow improvement.    Subjective: Afebrile overnight A/O x4, positive S OB.  States did get out of the bed and sat in chair for a few minutes, and washed out.  However when I spoke with RN, RN stated patient remained in chair only for a very few minutes, refused to work with PT.  RN states this has been going on for several days   Assessment & Plan: Covid vaccination;   Active Problems:   Pneumonia due to COVID-19 virus   Acute respiratory failure with hypoxia (HCC)   Acute Hypoxic Respiratory Failure 2/2 COVID 19 Pneumonia COVID-19 Labs  Recent Labs    10/05/20 0427 10/06/20 0441 10/07/20 0444  DDIMER 0.65* 0.95* 0.59*  FERRITIN 651*  --  517*  CRP 0.5  --  1.3*   9/30 SARS coronavirus positive (at [facility])  -Baricitinib per pharmacy protocol -Completed Remdesivir per pharmacy protocol -Solu-Medrol 40 mg BID -Breo Ellipta 100-25 mcg/INH -Incruse Ellipta 62.5 mcg per INH -Flutter valve -Incentive spirometry -Vitamin C and zinc per Covid protocol -Prone 8 hours/day; if cannot tolerate prone 2 to 3 hours per shift  COPD -See Covid.  Patient states not on home O2, however patient may wind up on O2 for extended period of time.  Elevated  LFTs -Resolved  Leukocytosis -Negative fever, bands, mild left shift most likely secondary to continue steroids.    Hypothyroidism -Synthroid 50 mcg daily  Essential HTN -BP controlled without medication currently  Chronic Back Pain norco prn  Depression Wellbutrin   Goals of care -10/14 during the day all lights to be turned on.,  All shades open.  Patient to take all meals in the chair.  Patient must participate with physical therapy daily.      DVT prophylaxis: Lovenox Code Status: Full Family Communication:  Status is: Inpatient    Dispo: The patient is from: Home              Anticipated d/c is to: If patient continues to refuse PT and her respiratory status continues along the same lines most likely will require SNF              Anticipated d/c date is:??              Patient currently unstable      Consultants:    Procedures/Significant Events:    I have personally reviewed and interpreted all radiology studies and my findings are as above.  VENTILATOR SETTINGS: Nasal cannula 10/14 Flow 10 L/min SPO2 88%   Cultures 9/30 SARS coronavirus positive (at [facility])    Antimicrobials: Anti-infectives (From admission, onward)   Start     Ordered Stop   10/01/20 1000  remdesivir 100 mg in sodium chloride 0.9 % 100 mL IVPB       "  Followed by" Linked Group Details   09/30/20 1633 10/04/20 1030   10/01/20 1000  remdesivir 100 mg in sodium chloride 0.9 % 100 mL IVPB  Status:  Discontinued       "Followed by" Linked Group Details   09/30/20 1947 09/30/20 1951   09/30/20 2000  remdesivir 200 mg in sodium chloride 0.9% 250 mL IVPB  Status:  Discontinued       "Followed by" Linked Group Details   09/30/20 1947 09/30/20 1951   09/30/20 1800  remdesivir 200 mg in sodium chloride 0.9% 250 mL IVPB       "Followed by" Linked Group Details   09/30/20 1633 09/30/20 1819       Devices    LINES / TUBES:      Continuous  Infusions:   Objective: Vitals:   10/06/20 1949 10/07/20 0355 10/07/20 0500 10/07/20 1314  BP: 134/62 120/60  (!) 114/56  Pulse: 89 89  95  Resp: 18 (!) 21  18  Temp: 98.1 F (36.7 C) 98.4 F (36.9 C)  98.6 F (37 C)  TempSrc: Oral Oral  Oral  SpO2: 93% 91%  (!) 88%  Weight:   65.9 kg   Height:        Intake/Output Summary (Last 24 hours) at 10/07/2020 1538 Last data filed at 10/07/2020 0920 Gross per 24 hour  Intake 480 ml  Output 150 ml  Net 330 ml   Filed Weights   10/04/20 0457 10/06/20 0500 10/07/20 0500  Weight: 62.4 kg 63.7 kg 65.9 kg    Examination:  General: A/O x4, positive acute respiratory distress, cachectic Eyes: negative scleral hemorrhage, negative anisocoria, negative icterus ENT: Negative Runny nose, negative gingival bleeding, Neck:  Negative scars, masses, torticollis, lymphadenopathy, JVD Lungs: decrease Breath sounds bilaterally without wheezes, positive crackles LEFT lower lung fields Cardiovascular: Regular rate and rhythm without murmur gallop or rub normal S1 and S2 Abdomen: negative abdominal pain, nondistended, positive soft, bowel sounds, no rebound, no ascites, no appreciable mass Extremities: No significant cyanosis, clubbing, or edema bilateral lower extremities Skin: Negative rashes, lesions, ulcers Psychiatric:  Negative depression, negative anxiety, negative fatigue, negative mania  Central nervous system:  Cranial nerves II through XII intact, tongue/uvula midline, all extremities muscle strength 5/5, sensation intact throughout, negative dysarthria, negative expressive aphasia, negative receptive aphasia.  .     Data Reviewed: Care during the described time interval was provided by me .  I have reviewed this patient's available data, including medical history, events of note, physical examination, and all test results as part of my evaluation.  CBC: Recent Labs  Lab 10/03/20 0442 10/04/20 0442 10/05/20 0427 10/06/20 0441  10/07/20 0444  WBC 5.9 5.9 9.8 17.9* 25.4*  NEUTROABS 3.9 4.0 6.9 13.7* 21.3*  HGB 11.9* 11.9* 11.6* 12.1 12.4  HCT 36.1 35.9* 34.7* 36.4 38.0  MCV 86.4 86.1 84.6 86.1 87.4  PLT 303 337 379 510* 824*   Basic Metabolic Panel: Recent Labs  Lab 10/03/20 0442 10/04/20 0442 10/05/20 0427 10/06/20 0441 10/07/20 0444  NA 135 137 138 141 138  K 3.9 4.2 3.9 3.9 4.0  CL 99 100 102 105 102  CO2 27 28 26 26 25   GLUCOSE 159* 171* 172* 113* 88  BUN 20 21 24* 24* 27*  CREATININE 0.64 0.64 0.72 0.72 0.79  CALCIUM 8.6* 8.7* 8.7* 9.6 9.0  MG 2.1 2.2 2.3 2.4 2.3  PHOS 3.9 4.3 4.2 5.1* 4.9*   GFR: Estimated Creatinine Clearance: 62 mL/min (by C-G  formula based on SCr of 0.79 mg/dL). Liver Function Tests: Recent Labs  Lab 10/03/20 0442 10/04/20 0442 10/05/20 0427 10/06/20 0441 10/07/20 0444  AST 79* 51* 40 26 26  ALT 71* 65* 58* 47* 39  ALKPHOS 103 95 95 91 82  BILITOT 0.5 0.7 0.7 0.6 0.5  PROT 6.2* 6.1* 5.9* 6.0* 6.1*  ALBUMIN 2.9* 2.9* 2.8* 2.8* 2.9*   No results for input(s): LIPASE, AMYLASE in the last 168 hours. No results for input(s): AMMONIA in the last 168 hours. Coagulation Profile: No results for input(s): INR, PROTIME in the last 168 hours. Cardiac Enzymes: No results for input(s): CKTOTAL, CKMB, CKMBINDEX, TROPONINI in the last 168 hours. BNP (last 3 results) No results for input(s): PROBNP in the last 8760 hours. HbA1C: No results for input(s): HGBA1C in the last 72 hours. CBG: Recent Labs  Lab 10/06/20 1123 10/06/20 1644 10/06/20 2111 10/07/20 0744 10/07/20 1107  GLUCAP 200* 140* 204* 113* 144*   Lipid Profile: No results for input(s): CHOL, HDL, LDLCALC, TRIG, CHOLHDL, LDLDIRECT in the last 72 hours. Thyroid Function Tests: No results for input(s): TSH, T4TOTAL, FREET4, T3FREE, THYROIDAB in the last 72 hours. Anemia Panel: Recent Labs    10/05/20 0427 10/07/20 0444  FERRITIN 651* 517*   Sepsis Labs: Recent Labs  Lab 09/30/20 1543  PROCALCITON  <0.10  LATICACIDVEN 1.2    Recent Results (from the past 240 hour(s))  Blood Culture (routine x 2)     Status: None   Collection Time: 09/30/20  3:43 PM   Specimen: BLOOD  Result Value Ref Range Status   Specimen Description   Final    BLOOD LEFT ANTECUBITAL Performed at [facility] [address] [place], [place] [address]    Special Requests   Final    BOTTLES DRAWN AEROBIC AND ANAEROBIC Blood Culture results may not be optimal due to an excessive volume of blood received in culture bottles Performed at [facility] [address], [place], [place] [address]    Culture   Final    NO GROWTH 5 DAYS Performed at [facility], [place] [address], [place], [place] [address]    Report Status 10/05/2020 FINAL  Final  Blood Culture (routine x 2)     Status: None   Collection Time: 09/30/20  3:45 PM   Specimen: BLOOD  Result Value Ref Range Status   Specimen Description   Final    BLOOD RIGHT ANTECUBITAL Performed at [facility] [address], [place], [place] [address]    Special Requests   Final    BOTTLES DRAWN AEROBIC AND ANAEROBIC Blood Culture adequate volume Performed at [facility] [address], [place], [place] [address]    Culture   Final    NO GROWTH 5 DAYS Performed at [facility], [place] [address], [place], [place] [address]    Report Status 10/05/2020 FINAL  Final         Radiology Studies: DG CHEST PORT 1 VIEW  Result Date: 10/06/2020 CLINICAL DATA:  Shortness of breath. EXAM: PORTABLE CHEST 1 VIEW COMPARISON:  October 03, 2020. FINDINGS: The heart size and mediastinal contours are within normal limits. No pneumothorax or pleural effusion is noted. Left lung is clear. Stable minimal right upper lobe opacity is noted which may represent atelectasis, infiltrate or scarring. The visualized skeletal structures are unremarkable. IMPRESSION: Stable minimal right upper lobe  opacity is noted which may represent atelectasis, infiltrate or  scarring. Electronically Signed   By: Marijo Conception M.D.   On: 10/06/2020 08:19        Scheduled Meds:  vitamin C  500 mg Oral Daily   aspirin EC  81 mg Oral Daily   baricitinib  4 mg Oral Daily   buPROPion  150 mg Oral Daily   docusate sodium  100 mg Oral BID   enoxaparin (LOVENOX) injection  40 mg Subcutaneous Q24H   feeding supplement  237 mL Oral BID BM   fluticasone furoate-vilanterol  1 puff Inhalation Daily   insulin aspart  0-9 Units Subcutaneous TID WC   levothyroxine  50 mcg Oral QAC breakfast   methylPREDNISolone (SOLU-MEDROL) injection  40 mg Intravenous Q12H   multivitamin with minerals  1 tablet Oral Daily   polyethylene glycol  17 g Oral Daily   umeclidinium bromide  1 puff Inhalation Daily   zinc sulfate  220 mg Oral Daily   Continuous Infusions:   LOS: 7 days    Time spent:40 min    Ramiya Delahunty, Geraldo Docker, MD [facility] Pager [phone]  If 7PM-7AM, please contact night-coverage [email or web address] Password [facility] 10/07/2020, 3:38 PM

## 2020-10-07 NOTE — Progress Notes (Signed)
10/07/2020  1745  Per MD order the NT and I went into patients room and advised her that we needed to get her up in the chair per MD order. Patient states she will sit on the edge of the chair for a moment, but would not sit long. We informed her that getting her up to the chair and increase movement can help with her lungs. Patient states her back and her neck hurts in the hair. I asked patient if we get her more pillows and a bone pillow for her neck would that make her more comfort and we could lean the chair back too. I went and found two more pillows totaling 4 pillows and I went to the 4th floor t get her a bone pillow. NT adjusted all the pillows and applied the bone pillow behind pt's neck. Pt took the bone pillow out and throw the pillow at the NT. Patient ate her dinner in the chair which was about 15 minutes and she got herself back to the bed.

## 2020-10-07 NOTE — Care Management Important Message (Signed)
Important Message  Patient Details IM Letter given to the Patient Name: Jean Shaffer MRN: [id number] Date of Birth: 1951-03-30   Medicare Important Message Given:  Yes     Kerin Salen 10/07/2020, 10:35 AM

## 2020-10-08 ENCOUNTER — Encounter (HOSPITAL_COMMUNITY): Payer: Self-pay | Admitting: Internal Medicine

## 2020-10-08 DIAGNOSIS — E038 Other specified hypothyroidism: Secondary | ICD-10-CM | POA: Diagnosis not present

## 2020-10-08 DIAGNOSIS — R0602 Shortness of breath: Secondary | ICD-10-CM | POA: Diagnosis not present

## 2020-10-08 DIAGNOSIS — U071 COVID-19: Secondary | ICD-10-CM | POA: Diagnosis not present

## 2020-10-08 DIAGNOSIS — J9601 Acute respiratory failure with hypoxia: Secondary | ICD-10-CM | POA: Diagnosis not present

## 2020-10-08 LAB — CBC WITH DIFFERENTIAL/PLATELET
Abs Immature Granulocytes: 0.57 10*3/uL — ABNORMAL HIGH (ref 0.00–0.07)
Basophils Absolute: 0 10*3/uL (ref 0.0–0.1)
Basophils Relative: 0 %
Eosinophils Absolute: 0 10*3/uL (ref 0.0–0.5)
Eosinophils Relative: 0 %
HCT: 35.2 % — ABNORMAL LOW (ref 36.0–46.0)
Hemoglobin: 11.5 g/dL — ABNORMAL LOW (ref 12.0–15.0)
Immature Granulocytes: 3 %
Lymphocytes Relative: 5 %
Lymphs Abs: 0.9 10*3/uL (ref 0.7–4.0)
MCH: 28.5 pg (ref 26.0–34.0)
MCHC: 32.7 g/dL (ref 30.0–36.0)
MCV: 87.3 fL (ref 80.0–100.0)
Monocytes Absolute: 0.9 10*3/uL (ref 0.1–1.0)
Monocytes Relative: 5 %
Neutro Abs: 16.4 10*3/uL — ABNORMAL HIGH (ref 1.7–7.7)
Neutrophils Relative %: 87 %
Platelets: 413 10*3/uL — ABNORMAL HIGH (ref 150–400)
RBC: 4.03 MIL/uL (ref 3.87–5.11)
RDW: 13.5 % (ref 11.5–15.5)
WBC: 18.8 10*3/uL — ABNORMAL HIGH (ref 4.0–10.5)
nRBC: 0 % (ref 0.0–0.2)

## 2020-10-08 LAB — GLUCOSE, CAPILLARY
Glucose-Capillary: 84 mg/dL (ref 70–99)
Glucose-Capillary: 164 mg/dL — ABNORMAL HIGH (ref 70–99)
Glucose-Capillary: 128 mg/dL — ABNORMAL HIGH (ref 70–99)
Glucose-Capillary: 123 mg/dL — ABNORMAL HIGH (ref 70–99)

## 2020-10-08 LAB — COMPREHENSIVE METABOLIC PANEL
ALT: 29 U/L (ref 0–44)
AST: 21 U/L (ref 15–41)
Albumin: 2.8 g/dL — ABNORMAL LOW (ref 3.5–5.0)
Alkaline Phosphatase: 76 U/L (ref 38–126)
Anion gap: 10 (ref 5–15)
BUN: 28 mg/dL — ABNORMAL HIGH (ref 8–23)
CO2: 26 mmol/L (ref 22–32)
Calcium: 9.3 mg/dL (ref 8.9–10.3)
Chloride: 101 mmol/L (ref 98–111)
Creatinine, Ser: 0.87 mg/dL (ref 0.44–1.00)
GFR, Estimated: >60 mL/min (ref >60)
Glucose, Bld: 113 mg/dL — ABNORMAL HIGH (ref 70–99)
Potassium: 4.1 mmol/L (ref 3.5–5.1)
Sodium: 137 mmol/L (ref 135–145)
Total Bilirubin: 0.8 mg/dL (ref 0.3–1.2)
Total Protein: 6.1 g/dL — ABNORMAL LOW (ref 6.5–8.1)

## 2020-10-08 LAB — PHOSPHORUS: Phosphorus: 5.3 mg/dL — ABNORMAL HIGH (ref 2.5–4.6)

## 2020-10-08 LAB — D-DIMER, QUANTITATIVE: D-Dimer, Quant: 0.57 ug/mL-FEU — ABNORMAL HIGH (ref 0.00–0.50)

## 2020-10-08 LAB — MAGNESIUM: Magnesium: 2.4 mg/dL (ref 1.7–2.4)

## 2020-10-08 LAB — C-REACTIVE PROTEIN: CRP: 7 mg/dL — ABNORMAL HIGH (ref <1.0)

## 2020-10-08 LAB — LACTATE DEHYDROGENASE: LDH: 320 U/L — ABNORMAL HIGH (ref 98–192)

## 2020-10-08 LAB — FERRITIN: Ferritin: 487 ng/mL — ABNORMAL HIGH (ref 11–307)

## 2020-10-08 NOTE — Progress Notes (Signed)
Physical Therapy Treatment Patient Details Name: Jean Shaffer MRN: [id number] DOB: April 07, 1951 Today's Date: 10/08/2020    History of Present Illness Jean Shaffer is a 69 y.o. female with Past medical history of asthma and COPD, chronic back pain, HTN, hypothyroidism. Patient presented with complaints of cough and shortness of breath.  Patient started with symptoms on September 23, 2020. Admitted to hospital with COVID pneumonia.    PT Comments    Patient resting in recliner on 10 L HFNC. SPO2 89%. Patient participated in seated and standing  Exercises. SPO2 dropping to 81%(finger sensor-erratic pleth) with intermittent 92% readings. Patient noted to  control  rr much better and appears less anxious. Patient agreed to amb in room next visit. . Patient did perform pursed lip breathing . SPo2 returned to 88%   Follow Up Recommendations  Home health PT     Equipment Recommendations  None recommended by PT    Recommendations for Other Services       Precautions / Restrictions Precautions Precautions: Fall Precaution Comments: monitor sats    Mobility  Bed Mobility Overal bed mobility: Modified Independent                Transfers Overall transfer level: Needs assistance   Transfers: Sit to/from Stand;Stand Pivot Transfers Sit to Stand: Supervision Stand pivot transfers: Supervision       General transfer comment: from recliner to bed  Ambulation/Gait                 Stairs             Wheelchair Mobility    Modified Rankin (Stroke Patients Only)       Balance     Sitting balance-Leahy Scale: Good     Standing balance support: During functional activity;Single extremity supported Standing balance-Leahy Scale: Fair Standing balance comment: for exercises                            Cognition Arousal/Alertness: Awake/alert Behavior During Therapy: Flat affect                                   General  Comments: less anxious and participatory      Exercises General Exercises - Upper Extremity Shoulder Flexion: AROM;Seated;Strengthening;Theraband;Both;10 reps Theraband Level (Shoulder Flexion): Level 2 (Red) Shoulder Extension: AROM;Strengthening;Both;10 reps;Seated;Theraband Theraband Level (Shoulder Extension): Level 2 (Red) Shoulder ABduction: AROM;Strengthening;Both;10 reps;Theraband;Seated Theraband Level (Shoulder Abduction): Level 2 (Red) General Exercises - Lower Extremity Long Arc Quad: AROM;Both;10 reps;Seated Hip Flexion/Marching: AROM;Seated;Standing;Both;10 reps Toe Raises: Both;10 reps;Standing    General Comments        Pertinent Vitals/Pain Pain Assessment: No/denies pain    Home Living                      Prior Function            PT Goals (current goals can now be found in the care plan section) Progress towards PT goals: Progressing toward goals    Frequency    Min 3X/week      PT Plan Current plan remains appropriate    Co-evaluation              AM-PAC PT "6 Clicks" Mobility   Outcome Measure  Help needed turning from your back to your side while in a flat bed without using bedrails?: None  Help needed moving from lying on your back to sitting on the side of a flat bed without using bedrails?: None Help needed moving to and from a bed to a chair (including a wheelchair)?: A Little Help needed standing up from a chair using your arms (e.g., wheelchair or bedside chair)?: A Little Help needed to walk in hospital room?: A Lot Help needed climbing 3-5 steps with a railing? : A Lot 6 Click Score: 18    End of Session Equipment Utilized During Treatment: Oxygen Activity Tolerance: Patient tolerated treatment well Patient left: in bed;with call bell/phone within reach Nurse Communication: Mobility status PT Visit Diagnosis: Difficulty in walking, not elsewhere classified (R26.2);Unsteadiness on feet (R26.81)     Time:  4099-2780 PT Time Calculation (min) (ACUTE ONLY): 18 min  Charges:  $Therapeutic Exercise: 8-22 mins                    Tresa Endo PT Acute Rehabilitation Services Pager [phone] Office [phone]    Claretha Cooper 10/08/2020, 5:17 PM

## 2020-10-08 NOTE — Progress Notes (Signed)
Patient refusing to get out of bed for breakfast. Did get up to the [facility] but is now back in bed. 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

## 2020-10-08 NOTE — Progress Notes (Signed)
PROGRESS NOTE    Jean Shaffer  OBS:[id number] DOB: 04-Jul-1951 DOA: 09/30/2020 PCP: [name], MD     Brief Narrative:  Jean Scaduto Goodmanis a 69 y.o.WF PMHx asthma and COPD, chronic back pain, HTN, hypothyroidism.   Patient presented with complaints of cough and shortness of breath.Patient started with symptoms on September 23, 2020. Tested at [place] facility. Presented 10/7 for monoclonal antibody infusion but was found hypoxic and therefore was transferred to ER for further work-up.  She's been admitted for COVID 19 pneumonia.  She's on HFNC at 10 L with relatively slow improvement on steroids and baricitinib (s/p remdesivir).  Expect her to required several more days - week + of hospitalization with slow improvement.    Subjective: 10/15 afebrile overnight A/O x4, positive S OB, currently tachypneic had ambulated from chair to bed.   Assessment & Plan: Covid vaccination; not vaccinated   Active Problems:   Pneumonia due to COVID-19 virus   Acute respiratory failure with hypoxia (HCC)   Acute Hypoxic Respiratory Failure 2/2 COVID 19 Pneumonia COVID-19 Labs  Recent Labs    10/06/20 0441 10/07/20 0444 10/08/20 0412  DDIMER 0.95* 0.59* 0.57*  FERRITIN  --  517* 487*  LDH  --   --  320*  CRP  --  1.3* 7.0*   9/30 SARS coronavirus positive (at [facility])  -Baricitinib per pharmacy protocol -Completed Remdesivir per pharmacy protocol -Solu-Medrol 40 mg BID -Breo Ellipta 100-25 mcg/INH -Incruse Ellipta 62.5 mcg per INH -Flutter valve -Incentive spirometry -Vitamin C and zinc per Covid protocol -Prone 8 hours/day; if cannot tolerate prone 2 to 3 hours per shift -10/15 discuss Covid convalescent plasma with patient if patient's respiratory status continues to deteriorate and inflammatory markers continue to rise patient agrees she would like to speak again seriously concerning its use.  COPD -See Covid.  Patient states not on home O2, however patient may  wind up on O2 for extended period of time.  Elevated LFTs -Resolved  Leukocytosis -Negative fever, bands, mild left shift most likely secondary to continue steroids.    Hypothyroidism -Synthroid 50 mcg daily  Essential HTN -BP controlled without medication currently  Chronic Back Pain norco prn  Depression Wellbutrin   Goals of care -10/14 during the day all lights to be turned on.,  All shades open.  Patient to take all meals in the chair.  Patient must participate with physical therapy daily.      DVT prophylaxis: Lovenox Code Status: Full Family Communication:  Status is: Inpatient    Dispo: The patient is from: Home              Anticipated d/c is to: If patient continues to refuse PT and her respiratory status continues along the same lines most likely will require SNF              Anticipated d/c date is:??              Patient currently unstable      Consultants:    Procedures/Significant Events:    I have personally reviewed and interpreted all radiology studies and my findings are as above.  VENTILATOR SETTINGS: HFNC 10/15 Flow 10 L/min SPO2 90%   Cultures 9/30 SARS coronavirus positive (at [facility])    Antimicrobials: Anti-infectives (From admission, onward)   Start     Ordered Stop   10/01/20 1000  remdesivir 100 mg in sodium chloride 0.9 % 100 mL IVPB       "Followed by"  Linked Group Details   09/30/20 1633 10/04/20 1030   10/01/20 1000  remdesivir 100 mg in sodium chloride 0.9 % 100 mL IVPB  Status:  Discontinued       "Followed by" Linked Group Details   09/30/20 1947 09/30/20 1951   09/30/20 2000  remdesivir 200 mg in sodium chloride 0.9% 250 mL IVPB  Status:  Discontinued       "Followed by" Linked Group Details   09/30/20 1947 09/30/20 1951   09/30/20 1800  remdesivir 200 mg in sodium chloride 0.9% 250 mL IVPB       "Followed by" Linked Group Details   09/30/20 1633 09/30/20 1819       Devices    LINES / TUBES:       Continuous Infusions:   Objective: Vitals:   10/07/20 2220 10/08/20 0430 10/08/20 0455 10/08/20 0457  BP:    126/71  Pulse:    83  Resp: (!) 24   18  Temp:    98.9 F (37.2 C)  TempSrc:    Oral  SpO2: 92% 93%  90%  Weight:   64.7 kg   Height:        Intake/Output Summary (Last 24 hours) at 10/08/2020 1330 Last data filed at 10/08/2020 3875 Gross per 24 hour  Intake 236 ml  Output --  Net 236 ml   Filed Weights   10/06/20 0500 10/07/20 0500 10/08/20 0455  Weight: 63.7 kg 65.9 kg 64.7 kg    Examination:  General: A/O x4, positive acute respiratory distress, cachectic Eyes: negative scleral hemorrhage, negative anisocoria, negative icterus ENT: Negative Runny nose, negative gingival bleeding, Neck:  Negative scars, masses, torticollis, lymphadenopathy, JVD Lungs: decrease Breath sounds bilaterally without wheezes, positive crackles LEFT lower lung fields Cardiovascular: Regular rate and rhythm without murmur gallop or rub normal S1 and S2 Abdomen: negative abdominal pain, nondistended, positive soft, bowel sounds, no rebound, no ascites, no appreciable mass Extremities: No significant cyanosis, clubbing, or edema bilateral lower extremities Skin: Negative rashes, lesions, ulcers Psychiatric:  Negative depression, negative anxiety, negative fatigue, negative mania  Central nervous system:  Cranial nerves II through XII intact, tongue/uvula midline, all extremities muscle strength 5/5, sensation intact throughout, negative dysarthria, negative expressive aphasia, negative receptive aphasia.  .     Data Reviewed: Care during the described time interval was provided by me .  I have reviewed this patient's available data, including medical history, events of note, physical examination, and all test results as part of my evaluation.  CBC: Recent Labs  Lab 10/04/20 0442 10/05/20 0427 10/06/20 0441 10/07/20 0444 10/08/20 0412  WBC 5.9 9.8 17.9* 25.4* 18.8*   NEUTROABS 4.0 6.9 13.7* 21.3* 16.4*  HGB 11.9* 11.6* 12.1 12.4 11.5*  HCT 35.9* 34.7* 36.4 38.0 35.2*  MCV 86.1 84.6 86.1 87.4 87.3  PLT 337 379 510* 495* 643*   Basic Metabolic Panel: Recent Labs  Lab 10/04/20 0442 10/05/20 0427 10/06/20 0441 10/07/20 0444 10/08/20 0412  NA 137 138 141 138 137  K 4.2 3.9 3.9 4.0 4.1  CL 100 102 105 102 101  CO2 28 26 26 25 26   GLUCOSE 171* 172* 113* 88 113*  BUN 21 24* 24* 27* 28*  CREATININE 0.64 0.72 0.72 0.79 0.87  CALCIUM 8.7* 8.7* 9.6 9.0 9.3  MG 2.2 2.3 2.4 2.3 2.4  PHOS 4.3 4.2 5.1* 4.9* 5.3*   GFR: Estimated Creatinine Clearance: 52.7 mL/min (by C-G formula based on SCr of 0.87 mg/dL). Liver Function Tests: Recent  Labs  Lab 10/04/20 0442 10/05/20 0427 10/06/20 0441 10/07/20 0444 10/08/20 0412  AST 51* 40 26 26 21   ALT 65* 58* 47* 39 29  ALKPHOS 95 95 91 82 76  BILITOT 0.7 0.7 0.6 0.5 0.8  PROT 6.1* 5.9* 6.0* 6.1* 6.1*  ALBUMIN 2.9* 2.8* 2.8* 2.9* 2.8*   No results for input(s): LIPASE, AMYLASE in the last 168 hours. No results for input(s): AMMONIA in the last 168 hours. Coagulation Profile: No results for input(s): INR, PROTIME in the last 168 hours. Cardiac Enzymes: No results for input(s): CKTOTAL, CKMB, CKMBINDEX, TROPONINI in the last 168 hours. BNP (last 3 results) No results for input(s): PROBNP in the last 8760 hours. HbA1C: No results for input(s): HGBA1C in the last 72 hours. CBG: Recent Labs  Lab 10/07/20 1107 10/07/20 1623 10/07/20 2114 10/08/20 0732 10/08/20 1149  GLUCAP 144* 116* 140* 84 164*   Lipid Profile: No results for input(s): CHOL, HDL, LDLCALC, TRIG, CHOLHDL, LDLDIRECT in the last 72 hours. Thyroid Function Tests: No results for input(s): TSH, T4TOTAL, FREET4, T3FREE, THYROIDAB in the last 72 hours. Anemia Panel: Recent Labs    10/07/20 0444 10/08/20 0412  FERRITIN 517* 487*   Sepsis Labs: No results for input(s): PROCALCITON, LATICACIDVEN in the last 168 hours.  Recent  Results (from the past 240 hour(s))  Blood Culture (routine x 2)     Status: None   Collection Time: 09/30/20  3:43 PM   Specimen: BLOOD  Result Value Ref Range Status   Specimen Description   Final    BLOOD LEFT ANTECUBITAL Performed at [facility] [address] [place], [place] [address]    Special Requests   Final    BOTTLES DRAWN AEROBIC AND ANAEROBIC Blood Culture results may not be optimal due to an excessive volume of blood received in culture bottles Performed at [facility] [address] [place], [place] [address]    Culture   Final    NO GROWTH 5 DAYS Performed at [facility], [place] [address], [place], [place] [address]    Report Status 10/05/2020 FINAL  Final  Blood Culture (routine x 2)     Status: None   Collection Time: 09/30/20  3:45 PM   Specimen: BLOOD  Result Value Ref Range Status   Specimen Description   Final    BLOOD RIGHT ANTECUBITAL Performed at [facility] [address], [place], [place] [address]    Special Requests   Final    BOTTLES DRAWN AEROBIC AND ANAEROBIC Blood Culture adequate volume Performed at [facility] [address] [place] [address], [place], [place] [address]    Culture   Final    NO GROWTH 5 DAYS Performed at [facility], [place] [address], [place],  [address]    Report Status 10/05/2020 FINAL  Final         Radiology Studies: No results found.      Scheduled Meds: . vitamin C  500 mg Oral Daily  . aspirin EC  81 mg Oral Daily  . baricitinib  4 mg Oral Daily  . buPROPion  150 mg Oral Daily  . docusate sodium  100 mg Oral BID  . enoxaparin (LOVENOX) injection  40 mg Subcutaneous Q24H  . feeding supplement  237 mL Oral BID BM  . fluticasone furoate-vilanterol  1 puff Inhalation Daily  . insulin aspart  0-9 Units Subcutaneous TID WC  . levothyroxine  50 mcg Oral  QAC breakfast  . methylPREDNISolone (SOLU-MEDROL) injection  40 mg  Intravenous Q12H  . multivitamin with minerals  1 tablet Oral Daily  . polyethylene glycol  17 g Oral Daily  . umeclidinium bromide  1 puff Inhalation Daily  . zinc sulfate  220 mg Oral Daily   Continuous Infusions:   LOS: 8 days    Time spent:40 min    Ahmere Hemenway, Geraldo Docker, MD [facility] Pager [phone]  If 7PM-7AM, please contact night-coverage [email or web address] Password [email or web address] 10/08/2020, 1:30 PM

## 2020-10-08 NOTE — Progress Notes (Signed)
Patient is up in the chair and getting ready to eat breakfast. Patient does de sat to the 78-80% with exertion. Patient is currently on    11 L of HFNC.

## 2020-10-09 ENCOUNTER — Inpatient Hospital Stay (HOSPITAL_COMMUNITY): Payer: Medicare Other

## 2020-10-09 DIAGNOSIS — R0602 Shortness of breath: Secondary | ICD-10-CM | POA: Diagnosis not present

## 2020-10-09 DIAGNOSIS — J9601 Acute respiratory failure with hypoxia: Secondary | ICD-10-CM | POA: Diagnosis not present

## 2020-10-09 DIAGNOSIS — U071 COVID-19: Secondary | ICD-10-CM | POA: Diagnosis not present

## 2020-10-09 DIAGNOSIS — E038 Other specified hypothyroidism: Secondary | ICD-10-CM | POA: Diagnosis not present

## 2020-10-09 LAB — PHOSPHORUS: Phosphorus: 4.7 mg/dL — ABNORMAL HIGH (ref 2.5–4.6)

## 2020-10-09 LAB — CBC WITH DIFFERENTIAL/PLATELET
Abs Immature Granulocytes: 0.79 10*3/uL — ABNORMAL HIGH (ref 0.00–0.07)
Basophils Absolute: 0 10*3/uL (ref 0.0–0.1)
Basophils Relative: 0 %
Eosinophils Absolute: 0 10*3/uL (ref 0.0–0.5)
Eosinophils Relative: 0 %
HCT: 35.5 % — ABNORMAL LOW (ref 36.0–46.0)
Hemoglobin: 11.8 g/dL — ABNORMAL LOW (ref 12.0–15.0)
Immature Granulocytes: 3 %
Lymphocytes Relative: 4 %
Lymphs Abs: 1 10*3/uL (ref 0.7–4.0)
MCH: 28.4 pg (ref 26.0–34.0)
MCHC: 33.2 g/dL (ref 30.0–36.0)
MCV: 85.3 fL (ref 80.0–100.0)
Monocytes Absolute: 1.5 10*3/uL — ABNORMAL HIGH (ref 0.1–1.0)
Monocytes Relative: 6 %
Neutro Abs: 20.7 10*3/uL — ABNORMAL HIGH (ref 1.7–7.7)
Neutrophils Relative %: 87 %
Platelets: 489 10*3/uL — ABNORMAL HIGH (ref 150–400)
RBC: 4.16 MIL/uL (ref 3.87–5.11)
RDW: 13.4 % (ref 11.5–15.5)
WBC: 24 10*3/uL — ABNORMAL HIGH (ref 4.0–10.5)
nRBC: 0 % (ref 0.0–0.2)

## 2020-10-09 LAB — GLUCOSE, CAPILLARY
Glucose-Capillary: 76 mg/dL (ref 70–99)
Glucose-Capillary: 119 mg/dL — ABNORMAL HIGH (ref 70–99)
Glucose-Capillary: 154 mg/dL — ABNORMAL HIGH (ref 70–99)
Glucose-Capillary: 141 mg/dL — ABNORMAL HIGH (ref 70–99)

## 2020-10-09 LAB — COMPREHENSIVE METABOLIC PANEL
ALT: 26 U/L (ref 0–44)
AST: 22 U/L (ref 15–41)
Albumin: 2.9 g/dL — ABNORMAL LOW (ref 3.5–5.0)
Alkaline Phosphatase: 73 U/L (ref 38–126)
Anion gap: 12 (ref 5–15)
BUN: 30 mg/dL — ABNORMAL HIGH (ref 8–23)
CO2: 27 mmol/L (ref 22–32)
Calcium: 9.3 mg/dL (ref 8.9–10.3)
Chloride: 101 mmol/L (ref 98–111)
Creatinine, Ser: 0.85 mg/dL (ref 0.44–1.00)
GFR, Estimated: >60 mL/min (ref >60)
Glucose, Bld: 87 mg/dL (ref 70–99)
Potassium: 4 mmol/L (ref 3.5–5.1)
Sodium: 140 mmol/L (ref 135–145)
Total Bilirubin: 0.8 mg/dL (ref 0.3–1.2)
Total Protein: 6.6 g/dL (ref 6.5–8.1)

## 2020-10-09 LAB — MAGNESIUM: Magnesium: 2.3 mg/dL (ref 1.7–2.4)

## 2020-10-09 LAB — D-DIMER, QUANTITATIVE: D-Dimer, Quant: 0.76 ug/mL-FEU — ABNORMAL HIGH (ref 0.00–0.50)

## 2020-10-09 LAB — FERRITIN: Ferritin: 538 ng/mL — ABNORMAL HIGH (ref 11–307)

## 2020-10-09 LAB — C-REACTIVE PROTEIN: CRP: 6.9 mg/dL — ABNORMAL HIGH (ref <1.0)

## 2020-10-09 LAB — LACTATE DEHYDROGENASE: LDH: 329 U/L — ABNORMAL HIGH (ref 98–192)

## 2020-10-09 MED ORDER — METHYLPREDNISOLONE SODIUM SUCC 125 MG IJ SOLR
60.0000 mg | Freq: Three times a day (TID) | INTRAMUSCULAR | Status: DC
  Administered 2020-10-09 – 2020-10-19 (×32): 60 mg via INTRAVENOUS
  Filled 2020-10-09 (×32): qty 2

## 2020-10-09 NOTE — Progress Notes (Signed)
Physical Therapy Treatment Patient Details Name: Jean Shaffer MRN: [id number] DOB: Dec 11, 1951 Today's Date: 10/09/2020    History of Present Illness Jean Shaffer is a 69 y.o. female with Past medical history of asthma and COPD, chronic back pain, HTN, hypothyroidism. Patient presented with complaints of cough and shortness of breath.  Patient started with symptoms on September 23, 2020. Admitted to hospital with COVID pneumonia.    PT Comments    Pt participating in multiple transfers and short distance ambulation.  She was on 15 L HFNC with sats dropping as low as 73% requiring 8-10 mins for recovery.  Also required frequent rest breaks and cues for pursed lip/deep breathing.  Continue to advance as able.     Follow Up Recommendations  Home health PT;Supervision for mobility/OOB     Equipment Recommendations  Rolling walker with 5" wheels    Recommendations for Other Services       Precautions / Restrictions Precautions Precautions: Fall Precaution Comments: monitor sats    Mobility  Bed Mobility Overal bed mobility: Modified Independent       Supine to sit: Modified independent (Device/Increase time)     General bed mobility comments: Increased time, verbal cues for pacing.  Transfers Overall transfer level: Needs assistance Equipment used: 1 person hand held assist Transfers: Sit to/from [facility] Sit to Stand: Min guard Stand pivot transfers: Min guard       General transfer comment: sit to stand x 5 throughout session; stand pivot x 3  Ambulation/Gait Ambulation/Gait assistance: Min assist Gait Distance (Feet): 10 Feet Assistive device: 1 person hand held assist Gait Pattern/deviations: Step-through pattern;Decreased stride length     General Gait Details: Unsteady requiring min A to balance; cues for pursed lip breathing   Stairs             Wheelchair Mobility    Modified Rankin (Stroke Patients Only)        Balance Overall balance assessment: Needs assistance Sitting-balance support: Feet supported;Single extremity supported Sitting balance-Leahy Scale: Good     Standing balance support: During functional activity;Single extremity supported Standing balance-Leahy Scale: Poor Standing balance comment: required min A for steadying with ambulation                            Cognition Arousal/Alertness: Awake/alert Behavior During Therapy: Flat affect Overall Cognitive Status: Within Functional Limits for tasks assessed                                        Exercises      General Comments General comments (skin integrity, edema, etc.): Pt on 15 L HFNC.  Per chart (and pt reports bad night), pt required 15 L and non-rebreather earlier today.  Pt's sats were 91% in sidelying dropping as low as 73% with activity.  She was given multiple rest breaks and cues for pursed lip/deep breathing.  Sats requiring 8-10 mins to recover after transfers to 86-88%.      Pertinent Vitals/Pain Pain Assessment: No/denies pain    Home Living                      Prior Function            PT Goals (current goals can now be found in the care plan section) Acute Rehab PT Goals Patient  Stated Goal: to feel better PT Goal Formulation: With patient Time For Goal Achievement: 10/15/20 Potential to Achieve Goals: Good Progress towards PT goals: Progressing toward goals    Frequency    Min 3X/week      PT Plan Current plan remains appropriate    Co-evaluation              AM-PAC PT "6 Clicks" Mobility   Outcome Measure  Help needed turning from your back to your side while in a flat bed without using bedrails?: None Help needed moving from lying on your back to sitting on the side of a flat bed without using bedrails?: None Help needed moving to and from a bed to a chair (including a wheelchair)?: A Little Help needed standing up from a chair using  your arms (e.g., wheelchair or bedside chair)?: A Little Help needed to walk in hospital room?: A Little Help needed climbing 3-5 steps with a railing? : A Lot 6 Click Score: 19    End of Session Equipment Utilized During Treatment: Oxygen Activity Tolerance: Patient limited by fatigue Patient left: with call bell/phone within reach;in chair Nurse Communication: Mobility status;Other (comment) (O2 sats 86-88% pt in chair) PT Visit Diagnosis: Difficulty in walking, not elsewhere classified (R26.2);Unsteadiness on feet (R26.81)     Time: 4782-9562 PT Time Calculation (min) (ACUTE ONLY): 27 min  Charges:  $Gait Training: 8-22 mins $Therapeutic Activity: 8-22 mins                     [name], PT Acute Rehab Services Pager [phone] [facility] 10/09/2020, 4:20 PM

## 2020-10-09 NOTE — Progress Notes (Signed)
LATE ENTRY- Approx 0525am I was informed by the NT that pt had O2 sats=low 80's. I asked her to get pt on her side as she normally recovers low 90-92%. Pt was unable to recover, RN at bedside increased pts HFNC to 15L with no change. Placed pt on O2 tank 25L and sats improved to 94-96% and pt verbalized feeling better. Pt was left on both O2 devices . As of now at [facility] pt still on both 12L HFNC & NRB with sats 94%. When taking NRB off to take  Medication, pt dropped to 87% so she is being left on both HFNC & NRB. Pt had been laying on her left side and now semifowlers

## 2020-10-09 NOTE — Progress Notes (Signed)
PROGRESS NOTE    Jean Shaffer  TGG:[id number] DOB: March 11, 1951 DOA: 09/30/2020 PCP: [name], MD     Brief Narrative:  Jean Burbridge Goodmanis a 69 y.o.WF PMHx asthma and COPD, chronic back pain, HTN, hypothyroidism.   Patient presented with complaints of cough and shortness of breath.Patient started with symptoms on September 23, 2020. Tested at [facility] facility. Presented 10/7 for monoclonal antibody infusion but was found hypoxic and therefore was transferred to ER for further work-up.  She's been admitted for COVID 19 pneumonia.  She's on HFNC at 10 L with relatively slow improvement on steroids and baricitinib (s/p remdesivir).  Expect her to required several more days - week + of hospitalization with slow improvement.    Subjective: 10/16 afebrile overnight A/O x4, patient had increased respiratory requirements overnight was placed on HFNC + nonrebreather, secondary to desatting into the low 80s with increased WOB and having difficulty recovering.  Now back on HFNC   Assessment & Plan: Covid vaccination; not vaccinated   Active Problems:   Pneumonia due to COVID-19 virus   Acute respiratory failure with hypoxia (HCC)   Acute Hypoxic Respiratory Failure 2/2 COVID 19 Pneumonia COVID-19 Labs  Recent Labs    10/07/20 0444 10/08/20 0412 10/09/20 0610  DDIMER 0.59* 0.57* 0.76*  FERRITIN 517* 487* 538*  LDH  --  320* 329*  CRP 1.3* 7.0* 6.9*   9/30 SARS coronavirus positive (at [facility])  -Baricitinib per pharmacy protocol -Completed Remdesivir per pharmacy protocol -Solu-Medrol 40 mg BID. -10/16 restart Solu-Medrol 60 mg TID -Breo Ellipta 100-25 mcg/INH -Incruse Ellipta 62.5 mcg per INH -Flutter valve -Incentive spirometry -Vitamin C and zinc per Covid protocol -Prone 8 hours/day; if cannot tolerate prone 2 to 3 hours per shift -10/15 discuss Covid convalescent plasma with patient if patient's respiratory status continues to deteriorate and inflammatory  markers continue to rise patient agrees she would like to speak again seriously concerning its use. -10/16 PCXR shows worsening bilateral opacification.  Patient currently holding her own on HFNC, if patient's respiratory status deteriorates any further overnight will have another discussion with her concerning use of CCP, as we have maxed out all other treatment modalities  COPD -See Covid.  Patient states not on home O2, however patient may wind up on O2 for extended period of time.  Elevated LFTs -Resolved  Leukocytosis -Negative fever, bands, mild left shift most likely secondary to continue steroids.    Hypothyroidism -Synthroid 50 mcg daily  Essential HTN -BP controlled without medication currently  Chronic Back Pain norco prn  Depression Wellbutrin   Goals of care -10/14 during the day all lights to be turned on.,  All shades open.  Patient to take all meals in the chair.  Patient must participate with physical therapy daily.      DVT prophylaxis: Lovenox Code Status: Full Family Communication:  Status is: Inpatient    Dispo: The patient is from: Home              Anticipated d/c is to: If patient continues to refuse PT and her respiratory status continues along the same lines most likely will require SNF              Anticipated d/c date is:??              Patient currently unstable      Consultants:    Procedures/Significant Events:  10/16 PCXR;-bilateral pulmonary infiltrates have worsened on the right and are new on the left in  the interval. .   I have personally reviewed and interpreted all radiology studies and my findings are as above.  VENTILATOR SETTINGS: HFNC + NRB 10/16 Flow 15 L/min SPO2 95%   Cultures 9/30 SARS coronavirus positive (at [facility])    Antimicrobials: Anti-infectives (From admission, onward)   Start     Ordered Stop   10/01/20 1000  remdesivir 100 mg in sodium chloride 0.9 % 100 mL IVPB       "Followed by" Linked  Group Details   09/30/20 1633 10/04/20 1030   10/01/20 1000  remdesivir 100 mg in sodium chloride 0.9 % 100 mL IVPB  Status:  Discontinued       "Followed by" Linked Group Details   09/30/20 1947 09/30/20 1951   09/30/20 2000  remdesivir 200 mg in sodium chloride 0.9% 250 mL IVPB  Status:  Discontinued       "Followed by" Linked Group Details   09/30/20 1947 09/30/20 1951   09/30/20 1800  remdesivir 200 mg in sodium chloride 0.9% 250 mL IVPB       "Followed by" Linked Group Details   09/30/20 1633 09/30/20 1819       Devices    LINES / TUBES:      Continuous Infusions:   Objective: Vitals:   10/08/20 2055 10/09/20 0500 10/09/20 0537 10/09/20 1354  BP: 139/70   (!) 124/58  Pulse: 88  84 90  Resp: 18   17  Temp: 98.1 F (36.7 C)   98.2 F (36.8 C)  TempSrc:    Oral  SpO2: 90%  95% (!) 89%  Weight:  67.9 kg    Height:        Intake/Output Summary (Last 24 hours) at 10/09/2020 1416 Last data filed at 10/09/2020 0843 Gross per 24 hour  Intake 120 ml  Output --  Net 120 ml   Filed Weights   10/07/20 0500 10/08/20 0455 10/09/20 0500  Weight: 65.9 kg 64.7 kg 67.9 kg   Physical Exam:  General: A/O x4, positive acute respiratory distress Eyes: negative scleral hemorrhage, negative anisocoria, negative icterus ENT: Negative Runny nose, negative gingival bleeding, Neck:  Negative scars, masses, torticollis, lymphadenopathy, JVD Lungs: tachypneic, decreased breath sounds bilaterally positive diffuse crackles Cardiovascular: Tachycardic without murmur gallop or rub normal S1 and S2 Abdomen: negative abdominal pain, nondistended, positive soft, bowel sounds, no rebound, no ascites, no appreciable mass Extremities: No significant cyanosis, clubbing, or edema bilateral lower extremities Skin: Negative rashes, lesions, ulcers Psychiatric:  Negative depression, negative anxiety, negative fatigue, negative mania  Central nervous system:  Cranial nerves II through XII  intact, tongue/uvula midline, all extremities muscle strength 5/5, sensation intact throughout, negative dysarthria, negative expressive aphasia, negative receptive aphasia.  .     Data Reviewed: Care during the described time interval was provided by me .  I have reviewed this patient's available data, including medical history, events of note, physical examination, and all test results as part of my evaluation.  CBC: Recent Labs  Lab 10/05/20 0427 10/06/20 0441 10/07/20 0444 10/08/20 0412 10/09/20 0610  WBC 9.8 17.9* 25.4* 18.8* 24.0*  NEUTROABS 6.9 13.7* 21.3* 16.4* 20.7*  HGB 11.6* 12.1 12.4 11.5* 11.8*  HCT 34.7* 36.4 38.0 35.2* 35.5*  MCV 84.6 86.1 87.4 87.3 85.3  PLT 379 510* 495* 413* 242*   Basic Metabolic Panel: Recent Labs  Lab 10/05/20 0427 10/06/20 0441 10/07/20 0444 10/08/20 0412 10/09/20 0610  NA 138 141 138 137 140  K 3.9 3.9 4.0 4.1  4.0  CL 102 105 102 101 101  CO2 26 26 25 26 27   GLUCOSE 172* 113* 88 113* 87  BUN 24* 24* 27* 28* 30*  CREATININE 0.72 0.72 0.79 0.87 0.85  CALCIUM 8.7* 9.6 9.0 9.3 9.3  MG 2.3 2.4 2.3 2.4 2.3  PHOS 4.2 5.1* 4.9* 5.3* 4.7*   GFR: Estimated Creatinine Clearance: 59.2 mL/min (by C-G formula based on SCr of 0.85 mg/dL). Liver Function Tests: Recent Labs  Lab 10/05/20 0427 10/06/20 0441 10/07/20 0444 10/08/20 0412 10/09/20 0610  AST 40 26 26 21 22   ALT 58* 47* 39 29 26  ALKPHOS 95 91 82 76 73  BILITOT 0.7 0.6 0.5 0.8 0.8  PROT 5.9* 6.0* 6.1* 6.1* 6.6  ALBUMIN 2.8* 2.8* 2.9* 2.8* 2.9*   No results for input(s): LIPASE, AMYLASE in the last 168 hours. No results for input(s): AMMONIA in the last 168 hours. Coagulation Profile: No results for input(s): INR, PROTIME in the last 168 hours. Cardiac Enzymes: No results for input(s): CKTOTAL, CKMB, CKMBINDEX, TROPONINI in the last 168 hours. BNP (last 3 results) No results for input(s): PROBNP in the last 8760 hours. HbA1C: No results for input(s): HGBA1C in the  last 72 hours. CBG: Recent Labs  Lab 10/08/20 1149 10/08/20 1718 10/08/20 2209 10/09/20 0731 10/09/20 1137  GLUCAP 164* 128* 123* 76 119*   Lipid Profile: No results for input(s): CHOL, HDL, LDLCALC, TRIG, CHOLHDL, LDLDIRECT in the last 72 hours. Thyroid Function Tests: No results for input(s): TSH, T4TOTAL, FREET4, T3FREE, THYROIDAB in the last 72 hours. Anemia Panel: Recent Labs    10/08/20 0412 10/09/20 0610  FERRITIN 487* 538*   Sepsis Labs: No results for input(s): PROCALCITON, LATICACIDVEN in the last 168 hours.  Recent Results (from the past 240 hour(s))  Blood Culture (routine x 2)     Status: None   Collection Time: 09/30/20  3:43 PM   Specimen: BLOOD  Result Value Ref Range Status   Specimen Description   Final    BLOOD LEFT ANTECUBITAL Performed at [facility] [address], [place], [place] [address]    Special Requests   Final    BOTTLES DRAWN AEROBIC AND ANAEROBIC Blood Culture results may not be optimal due to an excessive volume of blood received in culture bottles Performed at [facility] [address], [place], [place] [address]    Culture   Final    NO GROWTH 5 DAYS Performed at [facility], [place] [address] [place], [place] [address]    Report Status 10/05/2020 FINAL  Final  Blood Culture (routine x 2)     Status: None   Collection Time: 09/30/20  3:45 PM   Specimen: BLOOD  Result Value Ref Range Status   Specimen Description   Final    BLOOD RIGHT ANTECUBITAL Performed at [facility] [address], [place], [place] [address]    Special Requests   Final    BOTTLES DRAWN AEROBIC AND ANAEROBIC Blood Culture adequate volume Performed at [facility] [address] [place], [place] [address]    Culture   Final    NO GROWTH 5 DAYS Performed at [facility], [place] [address], [place], [place] [address]    Report Status 10/05/2020 FINAL  Final          Radiology Studies: No results found.      Scheduled Meds: . vitamin C  500 mg Oral Daily  .  aspirin EC  81 mg Oral Daily  . baricitinib  4 mg Oral Daily  . buPROPion  150 mg Oral Daily  . docusate sodium  100 mg Oral BID  . enoxaparin (LOVENOX) injection  40 mg Subcutaneous Q24H  . feeding supplement  237 mL Oral BID BM  . fluticasone furoate-vilanterol  1 puff Inhalation Daily  . insulin aspart  0-9 Units Subcutaneous TID WC  . levothyroxine  50 mcg Oral QAC breakfast  . multivitamin with minerals  1 tablet Oral Daily  . polyethylene glycol  17 g Oral Daily  . umeclidinium bromide  1 puff Inhalation Daily  . zinc sulfate  220 mg Oral Daily   Continuous Infusions:   LOS: 9 days    Time spent:40 min    Liza Czerwinski, Geraldo Docker, MD [facility] Pager [phone]  If 7PM-7AM, please contact night-coverage [email or web address] Password [email or web address] 10/09/2020, 2:16 PM

## 2020-10-10 DIAGNOSIS — E038 Other specified hypothyroidism: Secondary | ICD-10-CM | POA: Diagnosis not present

## 2020-10-10 DIAGNOSIS — U071 COVID-19: Secondary | ICD-10-CM | POA: Diagnosis not present

## 2020-10-10 DIAGNOSIS — R0602 Shortness of breath: Secondary | ICD-10-CM | POA: Diagnosis not present

## 2020-10-10 DIAGNOSIS — J9601 Acute respiratory failure with hypoxia: Secondary | ICD-10-CM | POA: Diagnosis not present

## 2020-10-10 LAB — COMPREHENSIVE METABOLIC PANEL
ALT: 25 U/L (ref 0–44)
AST: 19 U/L (ref 15–41)
Albumin: 2.8 g/dL — ABNORMAL LOW (ref 3.5–5.0)
Alkaline Phosphatase: 66 U/L (ref 38–126)
Anion gap: 10 (ref 5–15)
BUN: 26 mg/dL — ABNORMAL HIGH (ref 8–23)
CO2: 26 mmol/L (ref 22–32)
Calcium: 9.2 mg/dL (ref 8.9–10.3)
Chloride: 100 mmol/L (ref 98–111)
Creatinine, Ser: 0.8 mg/dL (ref 0.44–1.00)
GFR, Estimated: >60 mL/min (ref >60)
Glucose, Bld: 143 mg/dL — ABNORMAL HIGH (ref 70–99)
Potassium: 4.2 mmol/L (ref 3.5–5.1)
Sodium: 136 mmol/L (ref 135–145)
Total Bilirubin: 0.9 mg/dL (ref 0.3–1.2)
Total Protein: 6.6 g/dL (ref 6.5–8.1)

## 2020-10-10 LAB — CBC WITH DIFFERENTIAL/PLATELET
Abs Immature Granulocytes: 0.61 10*3/uL — ABNORMAL HIGH (ref 0.00–0.07)
Basophils Absolute: 0 10*3/uL (ref 0.0–0.1)
Basophils Relative: 0 %
Eosinophils Absolute: 0 10*3/uL (ref 0.0–0.5)
Eosinophils Relative: 0 %
HCT: 35.2 % — ABNORMAL LOW (ref 36.0–46.0)
Hemoglobin: 11.5 g/dL — ABNORMAL LOW (ref 12.0–15.0)
Immature Granulocytes: 4 %
Lymphocytes Relative: 4 %
Lymphs Abs: 0.5 10*3/uL — ABNORMAL LOW (ref 0.7–4.0)
MCH: 28.5 pg (ref 26.0–34.0)
MCHC: 32.7 g/dL (ref 30.0–36.0)
MCV: 87.1 fL (ref 80.0–100.0)
Monocytes Absolute: 0.6 10*3/uL (ref 0.1–1.0)
Monocytes Relative: 4 %
Neutro Abs: 13.1 10*3/uL — ABNORMAL HIGH (ref 1.7–7.7)
Neutrophils Relative %: 88 %
Platelets: 484 10*3/uL — ABNORMAL HIGH (ref 150–400)
RBC: 4.04 MIL/uL (ref 3.87–5.11)
RDW: 13.3 % (ref 11.5–15.5)
WBC: 14.9 10*3/uL — ABNORMAL HIGH (ref 4.0–10.5)
nRBC: 0 % (ref 0.0–0.2)

## 2020-10-10 LAB — PHOSPHORUS: Phosphorus: 5.5 mg/dL — ABNORMAL HIGH (ref 2.5–4.6)

## 2020-10-10 LAB — LACTATE DEHYDROGENASE: LDH: 291 U/L — ABNORMAL HIGH (ref 98–192)

## 2020-10-10 LAB — FERRITIN: Ferritin: 512 ng/mL — ABNORMAL HIGH (ref 11–307)

## 2020-10-10 LAB — GLUCOSE, CAPILLARY
Glucose-Capillary: 116 mg/dL — ABNORMAL HIGH (ref 70–99)
Glucose-Capillary: 153 mg/dL — ABNORMAL HIGH (ref 70–99)
Glucose-Capillary: 136 mg/dL — ABNORMAL HIGH (ref 70–99)
Glucose-Capillary: 162 mg/dL — ABNORMAL HIGH (ref 70–99)

## 2020-10-10 LAB — MAGNESIUM: Magnesium: 2.5 mg/dL — ABNORMAL HIGH (ref 1.7–2.4)

## 2020-10-10 LAB — D-DIMER, QUANTITATIVE: D-Dimer, Quant: 0.84 ug/mL-FEU — ABNORMAL HIGH (ref 0.00–0.50)

## 2020-10-10 LAB — C-REACTIVE PROTEIN: CRP: 10.6 mg/dL — ABNORMAL HIGH (ref <1.0)

## 2020-10-10 NOTE — Progress Notes (Signed)
This shift pt has been on 15L HFNC. Encouraged IS use and was able to achieve 900.  Pt easily desats to low 80's  with activity such as repositioning. Recovery time is > 5 minutes.Will continue to monitor

## 2020-10-10 NOTE — Progress Notes (Signed)
PROGRESS NOTE    Jean Shaffer  ZOX:[id number] DOB: 1951/03/17 DOA: 09/30/2020 PCP: [name], MD     Brief Narrative:  Jean Shaffer a 69 y.o.WF PMHx asthma and COPD, chronic back pain, HTN, hypothyroidism.   Patient presented with complaints of cough and shortness of breath.Patient started with symptoms on September 23, 2020. Tested at [facility] facility. Presented 10/7 for monoclonal antibody infusion but was found hypoxic and therefore was transferred to ER for further work-up.  She's been admitted for COVID 19 pneumonia.  She's on HFNC at 10 L with relatively slow improvement on steroids and baricitinib (s/p remdesivir).  Expect her to required several more days - week + of hospitalization with slow improvement.    Subjective: 10/17 afebrile overnight.  Sleepy but arousable.  A/O x4.  States feels her respiratory status has slightly improved.   Assessment & Plan: Covid vaccination; not vaccinated   Active Problems:   Pneumonia due to COVID-19 virus   Acute respiratory failure with hypoxia (HCC)   Acute Hypoxic Respiratory Failure 2/2 COVID 19 Pneumonia COVID-19 Labs  Recent Labs    10/08/20 0412 10/09/20 0610 10/10/20 0547  DDIMER 0.57* 0.76* 0.84*  FERRITIN 487* 538* 512*  LDH 320* 329* 291*  CRP 7.0* 6.9* 10.6*   9/30 SARS coronavirus positive (at [facility])  -Baricitinib per pharmacy protocol -Completed Remdesivir per pharmacy protocol -Solu-Medrol 40 mg BID. -10/16 restart Solu-Medrol 60 mg TID -Breo Ellipta 100-25 mcg/INH -Incruse Ellipta 62.5 mcg per INH -Flutter valve -Incentive spirometry -Vitamin C and zinc per Covid protocol -Prone 8 hours/day; if cannot tolerate prone 2 to 3 hours per shift -10/15 discuss Covid convalescent plasma with patient if patient's respiratory status continues to deteriorate and inflammatory markers continue to rise patient agrees she would like to speak again seriously concerning its use. -10/16 PCXR  shows worsening bilateral opacification.  Patient currently holding her own on HFNC, if patient's respiratory status deteriorates any further overnight will have another discussion with her concerning use of CCP, as we have maxed out all other treatment modalities  COPD -See Covid.  Patient states not on home O2, however patient may wind up on O2 for extended period of time.  Elevated LFTs -Resolved  Leukocytosis -Negative fever, bands, mild left shift most likely secondary to continue steroids.    Hypothyroidism -Synthroid 50 mcg daily  Essential HTN -BP controlled without medication currently  Chronic Back Pain -norco prn  Depression -Wellbutrin 150 mg daily  Goals of care -10/14 during the day all lights to be turned on.,  All shades open.  Patient to take all meals in the chair.  Patient must participate with physical therapy daily.      DVT prophylaxis: Lovenox Code Status: Full Family Communication:  Status is: Inpatient    Dispo: The patient is from: Home              Anticipated d/c is to: If patient continues to refuse PT and her respiratory status continues along the same lines most likely will require SNF              Anticipated d/c date is:??              Patient currently unstable      Consultants:    Procedures/Significant Events:  10/16 PCXR;-bilateral pulmonary infiltrates have worsened on the right and are new on the left in the interval. .   I have personally reviewed and interpreted all radiology studies and my findings  are as above.  VENTILATOR SETTINGS: HFNC 10/17 Flow 15 L/min SPO2 91%   Cultures 9/30 SARS coronavirus positive (at [facility])    Antimicrobials: Anti-infectives (From admission, onward)   Start     Ordered Stop   10/01/20 1000  remdesivir 100 mg in sodium chloride 0.9 % 100 mL IVPB       "Followed by" Linked Group Details   09/30/20 1633 10/04/20 1030   10/01/20 1000  remdesivir 100 mg in sodium chloride 0.9 %  100 mL IVPB  Status:  Discontinued       "Followed by" Linked Group Details   09/30/20 1947 09/30/20 1951   09/30/20 2000  remdesivir 200 mg in sodium chloride 0.9% 250 mL IVPB  Status:  Discontinued       "Followed by" Linked Group Details   09/30/20 1947 09/30/20 1951   09/30/20 1800  remdesivir 200 mg in sodium chloride 0.9% 250 mL IVPB       "Followed by" Linked Group Details   09/30/20 1633 09/30/20 1819       Devices    LINES / TUBES:      Continuous Infusions:   Objective: Vitals:   10/09/20 2010 10/09/20 2046 10/10/20 0453 10/10/20 0500  BP:  135/67 (!) 133/59   Pulse:  79 79   Resp:  18 13   Temp:  98.1 F (36.7 C) 97.9 F (36.6 C)   TempSrc:   Oral   SpO2: 93% 91% 90%   Weight:    64.4 kg  Height:        Intake/Output Summary (Last 24 hours) at 10/10/2020 1114 Last data filed at 10/10/2020 5621 Gross per 24 hour  Intake 480 ml  Output --  Net 480 ml   Filed Weights   10/08/20 0455 10/09/20 0500 10/10/20 0500  Weight: 64.7 kg 67.9 kg 64.4 kg   Physical Exam:  General: A/O x4, positive acute respiratory distress Eyes: negative scleral hemorrhage, negative anisocoria, negative icterus ENT: Negative Runny nose, negative gingival bleeding, Neck:  Negative scars, masses, torticollis, lymphadenopathy, JVD Lungs: decreased breath sounds bilaterally without wheezes or crackles Cardiovascular: Regular rate and rhythm without murmur gallop or rub normal S1 and S2 Abdomen: negative abdominal pain, nondistended, positive soft, bowel sounds, no rebound, no ascites, no appreciable mass Extremities: No significant cyanosis, clubbing, or edema bilateral lower extremities Skin: Negative rashes, lesions, ulcers Psychiatric:  Negative depression, negative anxiety, negative fatigue, negative mania  Central nervous system:  Cranial nerves II through XII intact, tongue/uvula midline, all extremities muscle strength 5/5, sensation intact throughout, negative  dysarthria, negative expressive aphasia, negative receptive aphasia.  .     Data Reviewed: Care during the described time interval was provided by me .  I have reviewed this patient's available data, including medical history, events of note, physical examination, and all test results as part of my evaluation.  CBC: Recent Labs  Lab 10/06/20 0441 10/07/20 0444 10/08/20 0412 10/09/20 0610 10/10/20 0547  WBC 17.9* 25.4* 18.8* 24.0* 14.9*  NEUTROABS 13.7* 21.3* 16.4* 20.7* 13.1*  HGB 12.1 12.4 11.5* 11.8* 11.5*  HCT 36.4 38.0 35.2* 35.5* 35.2*  MCV 86.1 87.4 87.3 85.3 87.1  PLT 510* 495* 413* 489* 308*   Basic Metabolic Panel: Recent Labs  Lab 10/06/20 0441 10/07/20 0444 10/08/20 0412 10/09/20 0610 10/10/20 0547  NA 141 138 137 140 136  K 3.9 4.0 4.1 4.0 4.2  CL 105 102 101 101 100  CO2 26 25 26 27 26   GLUCOSE  113* 88 113* 87 143*  BUN 24* 27* 28* 30* 26*  CREATININE 0.72 0.79 0.87 0.85 0.80  CALCIUM 9.6 9.0 9.3 9.3 9.2  MG 2.4 2.3 2.4 2.3 2.5*  PHOS 5.1* 4.9* 5.3* 4.7* 5.5*   GFR: Estimated Creatinine Clearance: 57.3 mL/min (by C-G formula based on SCr of 0.8 mg/dL). Liver Function Tests: Recent Labs  Lab 10/06/20 0441 10/07/20 0444 10/08/20 0412 10/09/20 0610 10/10/20 0547  AST 26 26 21 22 19   ALT 47* 39 29 26 25   ALKPHOS 91 82 76 73 66  BILITOT 0.6 0.5 0.8 0.8 0.9  PROT 6.0* 6.1* 6.1* 6.6 6.6  ALBUMIN 2.8* 2.9* 2.8* 2.9* 2.8*   No results for input(s): LIPASE, AMYLASE in the last 168 hours. No results for input(s): AMMONIA in the last 168 hours. Coagulation Profile: No results for input(s): INR, PROTIME in the last 168 hours. Cardiac Enzymes: No results for input(s): CKTOTAL, CKMB, CKMBINDEX, TROPONINI in the last 168 hours. BNP (last 3 results) No results for input(s): PROBNP in the last 8760 hours. HbA1C: No results for input(s): HGBA1C in the last 72 hours. CBG: Recent Labs  Lab 10/09/20 0731 10/09/20 1137 10/09/20 1633 10/09/20 2140  10/10/20 0731  GLUCAP 76 119* 154* 141* 116*   Lipid Profile: No results for input(s): CHOL, HDL, LDLCALC, TRIG, CHOLHDL, LDLDIRECT in the last 72 hours. Thyroid Function Tests: No results for input(s): TSH, T4TOTAL, FREET4, T3FREE, THYROIDAB in the last 72 hours. Anemia Panel: Recent Labs    10/09/20 0610 10/10/20 0547  FERRITIN 538* 512*   Sepsis Labs: No results for input(s): PROCALCITON, LATICACIDVEN in the last 168 hours.  Recent Results (from the past 240 hour(s))  Blood Culture (routine x 2)     Status: None   Collection Time: 09/30/20  3:43 PM   Specimen: BLOOD  Result Value Ref Range Status   Specimen Description   Final    BLOOD LEFT ANTECUBITAL Performed at [facility] [address], [place], [place] [address]    Special Requests   Final    BOTTLES DRAWN AEROBIC AND ANAEROBIC Blood Culture results may not be optimal due to an excessive volume of blood received in culture bottles Performed at [facility] [address], [place], [place] [address]    Culture   Final    NO GROWTH 5 DAYS Performed at [facility], [place] [address], [place], [place] [address]    Report Status 10/05/2020 FINAL  Final  Blood Culture (routine x 2)     Status: None   Collection Time: 09/30/20  3:45 PM   Specimen: BLOOD  Result Value Ref Range Status   Specimen Description   Final    BLOOD RIGHT ANTECUBITAL Performed at [facility] [address], [place], [place] [address]    Special Requests   Final    BOTTLES DRAWN AEROBIC AND ANAEROBIC Blood Culture adequate volume Performed at [facility] [address], [place], [place] [address]    Culture   Final    NO GROWTH 5 DAYS Performed at [facility], [place] [address], [place],  [address]    Report Status 10/05/2020 FINAL  Final         Radiology Studies: DG CHEST PORT 1 VIEW  Result Date: 10/09/2020 CLINICAL DATA:   COVID-19 positive.  Shortness of breath. EXAM: PORTABLE CHEST 1 VIEW COMPARISON:  October 06, 2020 FINDINGS: The heart, hila, and mediastinum are normal.  No pneumothorax. Bilateral pulmonary infiltrates have worsened on the right and are new on the left in the interval. No other interval changes. IMPRESSION: Bilateral pulmonary infiltrates consistent with worsening COVID-19 pneumonia. Electronically Signed   By: Dorise Bullion III M.D   On: 10/09/2020 16:12        Scheduled Meds: . vitamin C  500 mg Oral Daily  . aspirin EC  81 mg Oral Daily  . baricitinib  4 mg Oral Daily  . buPROPion  150 mg Oral Daily  . docusate sodium  100 mg Oral BID  . enoxaparin (LOVENOX) injection  40 mg Subcutaneous Q24H  . feeding supplement  237 mL Oral BID BM  . fluticasone furoate-vilanterol  1 puff Inhalation Daily  . insulin aspart  0-9 Units Subcutaneous TID WC  . levothyroxine  50 mcg Oral QAC breakfast  . methylPREDNISolone (SOLU-MEDROL) injection  60 mg Intravenous TID  . multivitamin with minerals  1 tablet Oral Daily  . polyethylene glycol  17 g Oral Daily  . umeclidinium bromide  1 puff Inhalation Daily  . zinc sulfate  220 mg Oral Daily   Continuous Infusions:   LOS: 10 days    Time spent:40 min    Marquasia Schmieder, Geraldo Docker, MD [facility] Pager [phone]  If 7PM-7AM, please contact night-coverage [email or web address] Password [email or web address] 10/10/2020, 11:14 AM

## 2020-10-11 DIAGNOSIS — J9601 Acute respiratory failure with hypoxia: Secondary | ICD-10-CM | POA: Diagnosis not present

## 2020-10-11 DIAGNOSIS — U071 COVID-19: Secondary | ICD-10-CM | POA: Diagnosis not present

## 2020-10-11 DIAGNOSIS — E038 Other specified hypothyroidism: Secondary | ICD-10-CM | POA: Diagnosis not present

## 2020-10-11 DIAGNOSIS — R0602 Shortness of breath: Secondary | ICD-10-CM | POA: Diagnosis not present

## 2020-10-11 LAB — COMPREHENSIVE METABOLIC PANEL
ALT: 21 U/L (ref 0–44)
AST: 17 U/L (ref 15–41)
Albumin: 2.6 g/dL — ABNORMAL LOW (ref 3.5–5.0)
Alkaline Phosphatase: 66 U/L (ref 38–126)
Anion gap: 9 (ref 5–15)
BUN: 33 mg/dL — ABNORMAL HIGH (ref 8–23)
CO2: 27 mmol/L (ref 22–32)
Calcium: 9.4 mg/dL (ref 8.9–10.3)
Chloride: 101 mmol/L (ref 98–111)
Creatinine, Ser: 0.84 mg/dL (ref 0.44–1.00)
GFR, Estimated: >60 mL/min (ref >60)
Glucose, Bld: 128 mg/dL — ABNORMAL HIGH (ref 70–99)
Potassium: 4.4 mmol/L (ref 3.5–5.1)
Sodium: 137 mmol/L (ref 135–145)
Total Bilirubin: 0.6 mg/dL (ref 0.3–1.2)
Total Protein: 6.5 g/dL (ref 6.5–8.1)

## 2020-10-11 LAB — FERRITIN: Ferritin: 481 ng/mL — ABNORMAL HIGH (ref 11–307)

## 2020-10-11 LAB — GLUCOSE, CAPILLARY
Glucose-Capillary: 111 mg/dL — ABNORMAL HIGH (ref 70–99)
Glucose-Capillary: 82 mg/dL (ref 70–99)
Glucose-Capillary: 179 mg/dL — ABNORMAL HIGH (ref 70–99)
Glucose-Capillary: 120 mg/dL — ABNORMAL HIGH (ref 70–99)

## 2020-10-11 LAB — CBC WITH DIFFERENTIAL/PLATELET
Abs Immature Granulocytes: 0.5 10*3/uL — ABNORMAL HIGH (ref 0.00–0.07)
Basophils Absolute: 0 10*3/uL (ref 0.0–0.1)
Basophils Relative: 0 %
Eosinophils Absolute: 0 10*3/uL (ref 0.0–0.5)
Eosinophils Relative: 0 %
HCT: 34.2 % — ABNORMAL LOW (ref 36.0–46.0)
Hemoglobin: 11.3 g/dL — ABNORMAL LOW (ref 12.0–15.0)
Immature Granulocytes: 3 %
Lymphocytes Relative: 4 %
Lymphs Abs: 0.6 10*3/uL — ABNORMAL LOW (ref 0.7–4.0)
MCH: 28.5 pg (ref 26.0–34.0)
MCHC: 33 g/dL (ref 30.0–36.0)
MCV: 86.4 fL (ref 80.0–100.0)
Monocytes Absolute: 1 10*3/uL (ref 0.1–1.0)
Monocytes Relative: 6 %
Neutro Abs: 14.6 10*3/uL — ABNORMAL HIGH (ref 1.7–7.7)
Neutrophils Relative %: 87 %
Platelets: 496 10*3/uL — ABNORMAL HIGH (ref 150–400)
RBC: 3.96 MIL/uL (ref 3.87–5.11)
RDW: 13.2 % (ref 11.5–15.5)
WBC: 16.8 10*3/uL — ABNORMAL HIGH (ref 4.0–10.5)
nRBC: 0 % (ref 0.0–0.2)

## 2020-10-11 LAB — MAGNESIUM: Magnesium: 2.6 mg/dL — ABNORMAL HIGH (ref 1.7–2.4)

## 2020-10-11 LAB — LACTATE DEHYDROGENASE: LDH: 234 U/L — ABNORMAL HIGH (ref 98–192)

## 2020-10-11 LAB — PHOSPHORUS: Phosphorus: 4.1 mg/dL (ref 2.5–4.6)

## 2020-10-11 LAB — C-REACTIVE PROTEIN: CRP: 4.7 mg/dL — ABNORMAL HIGH (ref <1.0)

## 2020-10-11 LAB — D-DIMER, QUANTITATIVE: D-Dimer, Quant: 0.84 ug/mL-FEU — ABNORMAL HIGH (ref 0.00–0.50)

## 2020-10-11 NOTE — Care Management Important Message (Signed)
Important Message  Patient Details IM Letter given to the Patient Name: Jean Shaffer MRN: [id number] Date of Birth: 1951/03/30   Medicare Important Message Given:  Yes     Kerin Salen 10/11/2020, 10:23 AM

## 2020-10-11 NOTE — Progress Notes (Signed)
PROGRESS NOTE    Jean Shaffer  INO:[id number] DOB: 02/22/1951 DOA: 09/30/2020 PCP: [name], MD     Brief Narrative:  Jean Shaffer a 69 y.o.WF PMHx asthma and COPD, chronic back pain, HTN, hypothyroidism.   Patient presented with complaints of cough and shortness of breath.Patient started with symptoms on September 23, 2020. Tested at [facility] facility. Presented 10/7 for monoclonal antibody infusion but was found hypoxic and therefore was transferred to ER for further work-up.  She's been admitted for COVID 19 pneumonia.  She's on HFNC at 10 L with relatively slow improvement on steroids and baricitinib (s/p remdesivir).  Expect her to required several more days - week + of hospitalization with slow improvement.    Subjective: 10/18 afebrile overnight, sitting in the chair comfortably.  States that she feels as if her respiratory status has improved slightly.  Negative nausea, negative vomiting negative diarrhea   Assessment & Plan: Covid vaccination; not vaccinated   Active Problems:   Pneumonia due to COVID-19 virus   Acute respiratory failure with hypoxia (HCC)   Acute Hypoxic Respiratory Failure 2/2 COVID 19 Pneumonia COVID-19 Labs  Recent Labs    10/09/20 0610 10/10/20 0547 10/11/20 0424  DDIMER 0.76* 0.84* 0.84*  FERRITIN 538* 512* 481*  LDH 329* 291* 234*  CRP 6.9* 10.6* 4.7*   9/30 SARS coronavirus positive (at [facility])  -Baricitinib per pharmacy protocol -Completed Remdesivir per pharmacy protocol -Solu-Medrol 40 mg BID. -10/16 restart Solu-Medrol 60 mg TID -Breo Ellipta 100-25 mcg/INH -Incruse Ellipta 62.5 mcg per INH -Flutter valve -Incentive spirometry -Vitamin C and zinc per Covid protocol -Prone 8 hours/day; if cannot tolerate prone 2 to 3 hours per shift -10/15 discuss Covid convalescent plasma with patient if patient's respiratory status continues to deteriorate and inflammatory markers continue to rise patient agrees she  would like to speak again seriously concerning its use. -10/16 PCXR shows worsening bilateral opacification.  Patient currently holding her own on HFNC, if patient's respiratory status deteriorates any further overnight will have another discussion with her concerning use of CCP, as we have maxed out all other treatment modalities -10/18 inflammatory markers improving, as well as patient's clinical picture with increased steroids.  Do not believe CCP will be necessary, in addition late in the course for to use.  COPD -See Covid.  Patient states not on home O2, however patient may wind up on O2 for extended period of time.  Elevated LFTs -Resolved  Leukocytosis -Negative fever, bands, mild left shift most likely secondary to continue steroids.    Hypothyroidism -Synthroid 50 mcg daily  Essential HTN -BP controlled without medication currently  Chronic Back Pain -norco prn  Depression -Wellbutrin 150 mg daily  Goals of care -10/14 during the day all lights to be turned on.,  All shades open.  Patient to take all meals in the chair.  Patient must participate with physical therapy daily.      DVT prophylaxis: Lovenox Code Status: Full Family Communication:  Status is: Inpatient    Dispo: The patient is from: Home              Anticipated d/c is to: If patient continues to refuse PT and her respiratory status continues along the same lines most likely will require SNF              Anticipated d/c date is:??              Patient currently unstable      Consultants:  Procedures/Significant Events:  10/16 PCXR;-bilateral pulmonary infiltrates have worsened on the right and are new on the left in the interval. .   I have personally reviewed and interpreted all radiology studies and my findings are as above.  VENTILATOR SETTINGS: HFNC 10/18 Flow 15 L/min SPO2 94%   Cultures 9/30 SARS coronavirus positive (at [facility])    Antimicrobials: Anti-infectives  (From admission, onward)   Start     Ordered Stop   10/01/20 1000  remdesivir 100 mg in sodium chloride 0.9 % 100 mL IVPB       "Followed by" Linked Group Details   09/30/20 1633 10/04/20 1030   10/01/20 1000  remdesivir 100 mg in sodium chloride 0.9 % 100 mL IVPB  Status:  Discontinued       "Followed by" Linked Group Details   09/30/20 1947 09/30/20 1951   09/30/20 2000  remdesivir 200 mg in sodium chloride 0.9% 250 mL IVPB  Status:  Discontinued       "Followed by" Linked Group Details   09/30/20 1947 09/30/20 1951   09/30/20 1800  remdesivir 200 mg in sodium chloride 0.9% 250 mL IVPB       "Followed by" Linked Group Details   09/30/20 1633 09/30/20 1819       Devices    LINES / TUBES:      Continuous Infusions:   Objective: Vitals:   10/10/20 1348 10/10/20 2024 10/11/20 0531 10/11/20 1348  BP: 126/64 (!) 141/65 (!) 170/78 129/64  Pulse: 92 77 73 85  Resp: 19 17 20 18   Temp: 98.4 F (36.9 C) 98.5 F (36.9 C) 97.8 F (36.6 C) 97.7 F (36.5 C)  TempSrc:  Oral Oral Oral  SpO2: 93% 93% 95% 94%  Weight:   63.2 kg   Height:        Intake/Output Summary (Last 24 hours) at 10/11/2020 1808 Last data filed at 10/11/2020 1200 Gross per 24 hour  Intake 540 ml  Output --  Net 540 ml   Filed Weights   10/09/20 0500 10/10/20 0500 10/11/20 0531  Weight: 67.9 kg 64.4 kg 63.2 kg   Physical Exam:  General: A/O x4, positive acute respiratory distress Eyes: negative scleral hemorrhage, negative anisocoria, negative icterus ENT: Negative Runny nose, negative gingival bleeding, Neck:  Negative scars, masses, torticollis, lymphadenopathy, JVD Lungs: decreased breath sounds bilaterally without wheezes or crackles Cardiovascular: Regular rate and rhythm without murmur gallop or rub normal S1 and S2 Abdomen: negative abdominal pain, nondistended, positive soft, bowel sounds, no rebound, no ascites, no appreciable mass Extremities: No significant cyanosis, clubbing, or edema  bilateral lower extremities Skin: Negative rashes, lesions, ulcers Psychiatric:  Negative depression, negative anxiety, negative fatigue, negative mania  Central nervous system:  Cranial nerves II through XII intact, tongue/uvula midline, all extremities muscle strength 5/5, sensation intact throughout, negative dysarthria, negative expressive aphasia, negative receptive aphasia.  .     Data Reviewed: Care during the described time interval was provided by me .  I have reviewed this patient's available data, including medical history, events of note, physical examination, and all test results as part of my evaluation.  CBC: Recent Labs  Lab 10/07/20 0444 10/08/20 0412 10/09/20 0610 10/10/20 0547 10/11/20 0424  WBC 25.4* 18.8* 24.0* 14.9* 16.8*  NEUTROABS 21.3* 16.4* 20.7* 13.1* 14.6*  HGB 12.4 11.5* 11.8* 11.5* 11.3*  HCT 38.0 35.2* 35.5* 35.2* 34.2*  MCV 87.4 87.3 85.3 87.1 86.4  PLT 495* 413* 489* 484* 416*   Basic Metabolic Panel: Recent  Labs  Lab 10/07/20 0444 10/08/20 0412 10/09/20 0610 10/10/20 0547 10/11/20 0424  NA 138 137 140 136 137  K 4.0 4.1 4.0 4.2 4.4  CL 102 101 101 100 101  CO2 25 26 27 26 27   GLUCOSE 88 113* 87 143* 128*  BUN 27* 28* 30* 26* 33*  CREATININE 0.79 0.87 0.85 0.80 0.84  CALCIUM 9.0 9.3 9.3 9.2 9.4  MG 2.3 2.4 2.3 2.5* 2.6*  PHOS 4.9* 5.3* 4.7* 5.5* 4.1   GFR: Estimated Creatinine Clearance: 54.6 mL/min (by C-G formula based on SCr of 0.84 mg/dL). Liver Function Tests: Recent Labs  Lab 10/07/20 0444 10/08/20 0412 10/09/20 0610 10/10/20 0547 10/11/20 0424  AST 26 21 22 19 17   ALT 39 29 26 25 21   ALKPHOS 82 76 73 66 66  BILITOT 0.5 0.8 0.8 0.9 0.6  PROT 6.1* 6.1* 6.6 6.6 6.5  ALBUMIN 2.9* 2.8* 2.9* 2.8* 2.6*   No results for input(s): LIPASE, AMYLASE in the last 168 hours. No results for input(s): AMMONIA in the last 168 hours. Coagulation Profile: No results for input(s): INR, PROTIME in the last 168 hours. Cardiac  Enzymes: No results for input(s): CKTOTAL, CKMB, CKMBINDEX, TROPONINI in the last 168 hours. BNP (last 3 results) No results for input(s): PROBNP in the last 8760 hours. HbA1C: No results for input(s): HGBA1C in the last 72 hours. CBG: Recent Labs  Lab 10/10/20 1638 10/10/20 2156 10/11/20 0749 10/11/20 1123 10/11/20 1645  GLUCAP 136* 162* 111* 82 179*   Lipid Profile: No results for input(s): CHOL, HDL, LDLCALC, TRIG, CHOLHDL, LDLDIRECT in the last 72 hours. Thyroid Function Tests: No results for input(s): TSH, T4TOTAL, FREET4, T3FREE, THYROIDAB in the last 72 hours. Anemia Panel: Recent Labs    10/10/20 0547 10/11/20 0424  FERRITIN 512* 481*   Sepsis Labs: No results for input(s): PROCALCITON, LATICACIDVEN in the last 168 hours.  No results found for this or any previous visit (from the past 240 hour(s)).       Radiology Studies: No results found.      Scheduled Meds: . vitamin C  500 mg Oral Daily  . aspirin EC  81 mg Oral Daily  . baricitinib  4 mg Oral Daily  . buPROPion  150 mg Oral Daily  . docusate sodium  100 mg Oral BID  . enoxaparin (LOVENOX) injection  40 mg Subcutaneous Q24H  . feeding supplement  237 mL Oral BID BM  . fluticasone furoate-vilanterol  1 puff Inhalation Daily  . insulin aspart  0-9 Units Subcutaneous TID WC  . levothyroxine  50 mcg Oral QAC breakfast  . methylPREDNISolone (SOLU-MEDROL) injection  60 mg Intravenous TID  . multivitamin with minerals  1 tablet Oral Daily  . polyethylene glycol  17 g Oral Daily  . umeclidinium bromide  1 puff Inhalation Daily  . zinc sulfate  220 mg Oral Daily   Continuous Infusions:   LOS: 11 days    Time spent:40 min    Dhairya Corales, Geraldo Docker, MD [facility] Pager [phone]  If 7PM-7AM, please contact night-coverage [email or web address] Password [facility] 10/11/2020, 6:08 PM

## 2020-10-11 NOTE — Progress Notes (Signed)
Physical Therapy Treatment Patient Details Name: Jean Shaffer MRN: [id number] DOB: 29-Apr-1951 Today's Date: 10/11/2020    History of Present Illness Jean Shaffer is a 69 y.o. female with Past medical history of asthma and COPD, chronic back pain, HTN, hypothyroidism. Patient presented with complaints of cough and shortness of breath.  Patient started with symptoms on September 23, 2020. Admitted to hospital with COVID pneumonia.    PT Comments    The  Patient participated in ambulation in room today. Patient resting on15 L HFNC at 95%. Patient ambulated x 25', Spo2 dropping to 82%, recovered  To 88% within 1 minute. Patient ambulated another 47' then 65' x 2 again with seated rest breaks. SPO2 again dropping to 78% post 2nd 50' walk at lowest. Recovering back to 85% and on back to 92% within 1.5 minutes. Patient does demonstrate decreased balance ,being this is  Her first ambulation in ~ 10days. Plan to decreased O2 with ambulation as tolerated, trying to wean down to 4 L.   Follow Up Recommendations  Home health PT;Supervision for mobility/OOB     Equipment Recommendations   (tba)    Recommendations for Other Services       Precautions / Restrictions Precautions Precautions: Fall Precaution Comments: monitor sats    Mobility  Bed Mobility               General bed mobility comments: in recliner  Transfers Overall transfer level: Needs assistance Equipment used: 1 person hand held assist Transfers: Sit to/from Stand Sit to Stand: Min guard         General transfer comment: steady assist to stabilize upon standing  Ambulation/Gait Ambulation/Gait assistance: Min assist Gait Distance (Feet): 25 Feet (x 2 then 50' x 2.) Assistive device: 1 person hand held assist (held onto bed rails and foot baord as a trailing) Gait Pattern/deviations: Staggering right Gait velocity: decr   General Gait Details: intermittently noted loss of balance.   Stairs              Wheelchair Mobility    Modified Rankin (Stroke Patients Only)       Balance   Sitting-balance support: Feet supported;Single extremity supported Sitting balance-Leahy Scale: Good     Standing balance support: During functional activity;Single extremity supported Standing balance-Leahy Scale: Fair Standing balance comment: required min A for steadying with ambulation                            Cognition Arousal/Alertness: Awake/alert Behavior During Therapy: Flat affect                                   General Comments: very willing to participate      Exercises      General Comments        Pertinent Vitals/Pain Pain Assessment: No/denies pain    Home Living                      Prior Function            PT Goals (current goals can now be found in the care plan section) Progress towards PT goals: Progressing toward goals    Frequency    Min 3X/week      PT Plan Current plan remains appropriate    Co-evaluation  AM-PAC PT "6 Clicks" Mobility   Outcome Measure  Help needed turning from your back to your side while in a flat bed without using bedrails?: None Help needed moving from lying on your back to sitting on the side of a flat bed without using bedrails?: None Help needed moving to and from a bed to a chair (including a wheelchair)?: A Little Help needed standing up from a chair using your arms (e.g., wheelchair or bedside chair)?: A Little Help needed to walk in hospital room?: A Little Help needed climbing 3-5 steps with a railing? : A Lot 6 Click Score: 19    End of Session Equipment Utilized During Treatment: Oxygen Activity Tolerance: Patient tolerated treatment well Patient left: in chair;with call bell/phone within reach Nurse Communication: Mobility status;Other (comment) PT Visit Diagnosis: Difficulty in walking, not elsewhere classified (R26.2);Unsteadiness on feet  (R26.81)     Time: 1430-1500 PT Time Calculation (min) (ACUTE ONLY): 30 min  Charges:  $Gait Training: 23-37 mins                     [place] Pager [phone] Office [phone]    Claretha Cooper 10/11/2020, 3:56 PM

## 2020-10-12 DIAGNOSIS — R0602 Shortness of breath: Secondary | ICD-10-CM | POA: Diagnosis not present

## 2020-10-12 DIAGNOSIS — E038 Other specified hypothyroidism: Secondary | ICD-10-CM | POA: Diagnosis not present

## 2020-10-12 DIAGNOSIS — U071 COVID-19: Secondary | ICD-10-CM | POA: Diagnosis not present

## 2020-10-12 DIAGNOSIS — J9601 Acute respiratory failure with hypoxia: Secondary | ICD-10-CM | POA: Diagnosis not present

## 2020-10-12 LAB — COMPREHENSIVE METABOLIC PANEL
ALT: 20 U/L (ref 0–44)
AST: 15 U/L (ref 15–41)
Albumin: 2.7 g/dL — ABNORMAL LOW (ref 3.5–5.0)
Alkaline Phosphatase: 65 U/L (ref 38–126)
Anion gap: 10 (ref 5–15)
BUN: 32 mg/dL — ABNORMAL HIGH (ref 8–23)
CO2: 27 mmol/L (ref 22–32)
Calcium: 9.3 mg/dL (ref 8.9–10.3)
Chloride: 99 mmol/L (ref 98–111)
Creatinine, Ser: 0.78 mg/dL (ref 0.44–1.00)
GFR, Estimated: >60 mL/min (ref >60)
Glucose, Bld: 134 mg/dL — ABNORMAL HIGH (ref 70–99)
Potassium: 4.5 mmol/L (ref 3.5–5.1)
Sodium: 136 mmol/L (ref 135–145)
Total Bilirubin: 0.8 mg/dL (ref 0.3–1.2)
Total Protein: 6.3 g/dL — ABNORMAL LOW (ref 6.5–8.1)

## 2020-10-12 LAB — CBC WITH DIFFERENTIAL/PLATELET
Abs Immature Granulocytes: 0.7 10*3/uL — ABNORMAL HIGH (ref 0.00–0.07)
Basophils Absolute: 0 10*3/uL (ref 0.0–0.1)
Basophils Relative: 0 %
Eosinophils Absolute: 0 10*3/uL (ref 0.0–0.5)
Eosinophils Relative: 0 %
HCT: 33.6 % — ABNORMAL LOW (ref 36.0–46.0)
Hemoglobin: 11.1 g/dL — ABNORMAL LOW (ref 12.0–15.0)
Immature Granulocytes: 4 %
Lymphocytes Relative: 4 %
Lymphs Abs: 0.7 10*3/uL (ref 0.7–4.0)
MCH: 28.8 pg (ref 26.0–34.0)
MCHC: 33 g/dL (ref 30.0–36.0)
MCV: 87 fL (ref 80.0–100.0)
Monocytes Absolute: 0.9 10*3/uL (ref 0.1–1.0)
Monocytes Relative: 5 %
Neutro Abs: 15.1 10*3/uL — ABNORMAL HIGH (ref 1.7–7.7)
Neutrophils Relative %: 87 %
Platelets: 475 10*3/uL — ABNORMAL HIGH (ref 150–400)
RBC: 3.86 MIL/uL — ABNORMAL LOW (ref 3.87–5.11)
RDW: 13.2 % (ref 11.5–15.5)
WBC: 17.5 10*3/uL — ABNORMAL HIGH (ref 4.0–10.5)
nRBC: 0 % (ref 0.0–0.2)

## 2020-10-12 LAB — GLUCOSE, CAPILLARY
Glucose-Capillary: 91 mg/dL (ref 70–99)
Glucose-Capillary: 121 mg/dL — ABNORMAL HIGH (ref 70–99)
Glucose-Capillary: 220 mg/dL — ABNORMAL HIGH (ref 70–99)
Glucose-Capillary: 145 mg/dL — ABNORMAL HIGH (ref 70–99)

## 2020-10-12 LAB — C-REACTIVE PROTEIN: CRP: 1.9 mg/dL — ABNORMAL HIGH (ref <1.0)

## 2020-10-12 LAB — LACTATE DEHYDROGENASE: LDH: 208 U/L — ABNORMAL HIGH (ref 98–192)

## 2020-10-12 LAB — D-DIMER, QUANTITATIVE: D-Dimer, Quant: 1.12 ug/mL-FEU — ABNORMAL HIGH (ref 0.00–0.50)

## 2020-10-12 LAB — MAGNESIUM: Magnesium: 2.4 mg/dL (ref 1.7–2.4)

## 2020-10-12 LAB — FERRITIN: Ferritin: 399 ng/mL — ABNORMAL HIGH (ref 11–307)

## 2020-10-12 LAB — PHOSPHORUS: Phosphorus: 4.5 mg/dL (ref 2.5–4.6)

## 2020-10-12 NOTE — Progress Notes (Signed)
Occupational Therapy Treatment Patient Details Name: Jean Shaffer MRN: [id number] DOB: April 01, 1951 Today's Date: 10/12/2020    History of present illness Jean Shaffer is a 69 y.o. female with Past medical history of asthma and COPD, chronic back pain, HTN, hypothyroidism. Patient presented with complaints of cough and shortness of breath.  Patient started with symptoms on September 23, 2020. Admitted to hospital with COVID pneumonia.   OT comments  Unfortunately, pt has shown a regression towards OT goals, compared to previous session. Patient limited by need for increased supplemental O2 via HFNC and NRB mask with decreased activity tolerance, only able to sit EOB for <30 seconds with desat to 77%, along with deficits noted below. Pt demonstrates fair rehab potential, but currently guarded due to medical status, and could benefit from continued skilled OT to increase safety and independence with ADLs and functional transfers to allow pt to return home safely and reduce caregiver burden and fall risk.   Follow Up Recommendations  Home health OT    Equipment Recommendations       Recommendations for Other Services      Precautions / Restrictions Precautions Precautions: Fall Precaution Comments: monitor sats Restrictions Weight Bearing Restrictions: No       Mobility Bed Mobility Overal bed mobility: Modified Independent Bed Mobility: Supine to Sit   Sidelying to sit: Supervision     Sit to sidelying: Supervision    Transfers                 General transfer comment: Pt unable to tolerate transfer today.    Balance   Sitting-balance support: Bilateral upper extremity supported Sitting balance-Leahy Scale: Good                                     ADL either performed or assessed with clinical judgement   ADL Overall ADL's : Needs assistance/impaired Eating/Feeding: Supervision/ safety Eating/Feeding Details (indicate cue type and  reason): Lying on side, pt took 1 sip of water with desaturation to 77% on 15L via HFNC and 4L NRB. Recovered to 90% after several min with cues for PLB.   Grooming Details (indicate cue type and reason): Pt attempted to sit EOB for light grooming but tolerated EOB sitting <30 seconds and iniitated return to sidelying in bed due to "I feel like I can't breath."  Cues for PLB and noted desat to 77% upon return to bed, with need of several min of pt deep breathing to recover to 90%.                                     Vision       Perception     Praxis      Cognition Arousal/Alertness: Lethargic Behavior During Therapy: Flat affect Overall Cognitive Status: Within Functional Limits for tasks assessed                                 General Comments: Needs encouragement to try today.        Exercises     Shoulder Instructions       General Comments      Pertinent Vitals/ Pain       Pain Assessment: Faces Faces Pain Scale: Hurts even more Pain Location:  Feeling unable to breath.  Home Living                                          Prior Functioning/Environment              Frequency  Min 2X/week        Progress Toward Goals  OT Goals(current goals can now be found in the care plan section)  Progress towards OT goals:  (Able to tolerate very minimal activity on 15L HFNC and NRB mask to 4L)  Acute Rehab OT Goals OT Goal Formulation: With patient Time For Goal Achievement: 10/16/20 Potential to Achieve Goals: Good  Plan      Co-evaluation                 AM-PAC OT "6 Clicks" Daily Activity     Outcome Measure   Help from another person eating meals?: A Little Help from another person taking care of personal grooming?: A Little Help from another person toileting, which includes using toliet, bedpan, or urinal?: A Little Help from another person bathing (including washing, rinsing, drying)?: A  Little Help from another person to put on and taking off regular upper body clothing?: A Little Help from another person to put on and taking off regular lower body clothing?: A Little 6 Click Score: 18    End of Session Equipment Utilized During Treatment: Oxygen  OT Visit Diagnosis: Muscle weakness (generalized) (M62.81)   Activity Tolerance Patient limited by fatigue   Patient Left in bed;with call bell/phone within reach;with nursing/sitter in room   Nurse Communication          Time: 9935-7017 OT Time Calculation (min): 15 min  Charges: OT General Charges $OT Visit: 1 Visit OT Treatments $Self Care/Home Management : 8-22 mins  Anderson Malta, [place] Office: [phone] 10/12/2020   Julien Girt 10/12/2020, 1:54 PM

## 2020-10-12 NOTE — Progress Notes (Signed)
Pt was found with O2sat 78% after turning on her L side. Stating its hard to breath, unable to recover like previously. Added 15L NRB and sats now 90%-92%. Will cont to monitor.

## 2020-10-12 NOTE — Progress Notes (Signed)
Pt was found with O2 sat 78% while sitting up in bed. Stating it's hard to breath. Bumped her O2 up to 14.5L HFNC and sats now 88%-91% while laying prone. Dr. Sherral Hammers notified. Will continue to monitor.

## 2020-10-12 NOTE — Progress Notes (Signed)
Patient O2 sat 92%, NRB removed O2 sat currently at 90%. Patient self repositioned. Will continue to monitor.

## 2020-10-12 NOTE — Progress Notes (Addendum)
Patient continuous pulse ox was alarming when this LPN presented to patient room her O2 sats were 78%. Pt stated that she was changing positions from right side to left side. Patient O2 sats never compensated after about 5 mins and remained 77-81%. Placed patient on 3L NRB to help compensate her oxygen. After a few mins her O2 was up to 89% but she immediately desated again to 80%. Bumped patient NRB up to 4L and she is slowly compensating to around 87-88%. Will continue to monitor patient.

## 2020-10-12 NOTE — Progress Notes (Signed)
PROGRESS NOTE    Jean Shaffer  YWV:[id number] DOB: 1951/09/30 DOA: 09/30/2020 PCP: [name], MD     Brief Narrative:  Jean Garretson Goodmanis a 69 y.o.WF PMHx asthma and COPD, chronic back pain, HTN, hypothyroidism.   Patient presented with complaints of cough and shortness of breath.Patient started with symptoms on September 23, 2020. Tested at [facility]. Presented 10/7 for monoclonal antibody infusion but was found hypoxic and therefore was transferred to ER for further work-up.  She's been admitted for COVID 19 pneumonia.  She's on HFNC at 10 L with relatively slow improvement on steroids and baricitinib (s/p remdesivir).  Expect her to required several more days - week + of hospitalization with slow improvement.    Subjective: 10/19 afebrile overnight A/O x4, sitting in chair slightly winded from ambulating to bedside commode and back.  Negative vomiting, negative nausea, negative diarrhea.   Assessment & Plan: Covid vaccination; not vaccinated   Active Problems:   Pneumonia due to COVID-19 virus   Acute respiratory failure with hypoxia (HCC)   Acute Hypoxic Respiratory Failure 2/2 COVID 19 Pneumonia COVID-19 Labs  Recent Labs    10/10/20 0547 10/11/20 0424 10/12/20 0408  DDIMER 0.84* 0.84* 1.12*  FERRITIN 512* 481* 399*  LDH 291* 234* 208*  CRP 10.6* 4.7* 1.9*   9/30 SARS coronavirus positive (at [facility])  -Baricitinib per pharmacy protocol -Completed Remdesivir per pharmacy protocol -Solu-Medrol 40 mg BID. -10/16 restart Solu-Medrol 60 mg TID -Breo Ellipta 100-25 mcg/INH -Incruse Ellipta 62.5 mcg per INH -Flutter valve -Incentive spirometry -Vitamin C and zinc per Covid protocol -Prone 8 hours/day; if cannot tolerate prone 2 to 3 hours per shift -10/15 discuss Covid convalescent plasma with patient if patient's respiratory status continues to deteriorate and inflammatory markers continue to rise patient agrees she would like to speak  again seriously concerning its use. -10/16 PCXR shows worsening bilateral opacification.  Patient currently holding her own on HFNC, if patient's respiratory status deteriorates any further overnight will have another discussion with her concerning use of CCP, as we have maxed out all other treatment modalities -10/18 inflammatory markers improving, as well as patient's clinical picture with increased steroids.  Do not believe CCP will be necessary, in addition late in the course for to use. -10/19 if patient recovers will be a long drawn out recovery may want to start thinking LTAC placement  COPD -See Covid.  Patient states not on home O2, however patient may wind up on O2 for extended period of time.  Elevated LFTs -Resolved  Leukocytosis -Negative fever, bands, mild left shift most likely secondary to continue steroids.    Hypothyroidism -Synthroid 50 mcg daily  Essential HTN -BP controlled without medication currently  Chronic Back Pain -norco prn  Depression -Wellbutrin 150 mg daily  Goals of care -10/14 during the day all lights to be turned on.,  All shades open.  Patient to take all meals in the chair.  Patient must participate with physical therapy daily.      DVT prophylaxis: Lovenox Code Status: Full Family Communication:  Status is: Inpatient    Dispo: The patient is from: Home              Anticipated d/c is to:  LTAC vs SNF If patient continues to refuse PT and her respiratory status continues along the same lines most likely will require LTAC              Anticipated d/c date is:??  Patient currently unstable      Consultants:    Procedures/Significant Events:  10/16 PCXR;-bilateral pulmonary infiltrates have worsened on the right and are new on the left in the interval. .   I have personally reviewed and interpreted all radiology studies and my findings are as above.  VENTILATOR SETTINGS: HFNC 10/19 Flow 14 L/min SPO2  93%   Cultures 9/30 SARS coronavirus positive (at [facility])    Antimicrobials: Anti-infectives (From admission, onward)   Start     Ordered Stop   10/01/20 1000  remdesivir 100 mg in sodium chloride 0.9 % 100 mL IVPB       "Followed by" Linked Group Details   09/30/20 1633 10/04/20 1030   10/01/20 1000  remdesivir 100 mg in sodium chloride 0.9 % 100 mL IVPB  Status:  Discontinued       "Followed by" Linked Group Details   09/30/20 1947 09/30/20 1951   09/30/20 2000  remdesivir 200 mg in sodium chloride 0.9% 250 mL IVPB  Status:  Discontinued       "Followed by" Linked Group Details   09/30/20 1947 09/30/20 1951   09/30/20 1800  remdesivir 200 mg in sodium chloride 0.9% 250 mL IVPB       "Followed by" Linked Group Details   09/30/20 1633 09/30/20 1819       Devices    LINES / TUBES:      Continuous Infusions:   Objective: Vitals:   10/11/20 1948 10/12/20 0015 10/12/20 0340 10/12/20 0500  BP: (!) 153/66  (!) 154/76   Pulse: 74  78   Resp: 18  (!) 21   Temp: 98.1 F (36.7 C)  97.9 F (36.6 C)   TempSrc: Oral  Oral   SpO2: 96% 90% 93%   Weight:    66 kg  Height:        Intake/Output Summary (Last 24 hours) at 10/12/2020 1031 Last data filed at 10/11/2020 1859 Gross per 24 hour  Intake 420 ml  Output --  Net 420 ml   Filed Weights   10/10/20 0500 10/11/20 0531 10/12/20 0500  Weight: 64.4 kg 63.2 kg 66 kg   Physical Exam:  General: A/O x4, positive acute respiratory distress Eyes: negative scleral hemorrhage, negative anisocoria, negative icterus ENT: Negative Runny nose, negative gingival bleeding, Neck:  Negative scars, masses, torticollis, lymphadenopathy, JVD Lungs: decreased breath sounds bilaterally without wheezes or crackles Cardiovascular: Regular rate and rhythm without murmur gallop or rub normal S1 and S2 Abdomen: negative abdominal pain, nondistended, positive soft, bowel sounds, no rebound, no ascites, no appreciable mass Extremities: No  significant cyanosis, clubbing, or edema bilateral lower extremities Skin: Negative rashes, lesions, ulcers Psychiatric:  Negative depression, negative anxiety, negative fatigue, negative mania  Central nervous system:  Cranial nerves II through XII intact, tongue/uvula midline, all extremities muscle strength 5/5, sensation intact throughout, negative dysarthria, negative expressive aphasia, negative receptive aphasia.  .     Data Reviewed: Care during the described time interval was provided by me .  I have reviewed this patient's available data, including medical history, events of note, physical examination, and all test results as part of my evaluation.  CBC: Recent Labs  Lab 10/08/20 0412 10/09/20 0610 10/10/20 0547 10/11/20 0424 10/12/20 0408  WBC 18.8* 24.0* 14.9* 16.8* 17.5*  NEUTROABS 16.4* 20.7* 13.1* 14.6* 15.1*  HGB 11.5* 11.8* 11.5* 11.3* 11.1*  HCT 35.2* 35.5* 35.2* 34.2* 33.6*  MCV 87.3 85.3 87.1 86.4 87.0  PLT 413* 489* 484* 496*  837*   Basic Metabolic Panel: Recent Labs  Lab 10/08/20 0412 10/09/20 0610 10/10/20 0547 10/11/20 0424 10/12/20 0408  NA 137 140 136 137 136  K 4.1 4.0 4.2 4.4 4.5  CL 101 101 100 101 99  CO2 26 27 26 27 27   GLUCOSE 113* 87 143* 128* 134*  BUN 28* 30* 26* 33* 32*  CREATININE 0.87 0.85 0.80 0.84 0.78  CALCIUM 9.3 9.3 9.2 9.4 9.3  MG 2.4 2.3 2.5* 2.6* 2.4  PHOS 5.3* 4.7* 5.5* 4.1 4.5   GFR: Estimated Creatinine Clearance: 62 mL/min (by C-G formula based on SCr of 0.78 mg/dL). Liver Function Tests: Recent Labs  Lab 10/08/20 0412 10/09/20 0610 10/10/20 0547 10/11/20 0424 10/12/20 0408  AST 21 22 19 17 15   ALT 29 26 25 21 20   ALKPHOS 76 73 66 66 65  BILITOT 0.8 0.8 0.9 0.6 0.8  PROT 6.1* 6.6 6.6 6.5 6.3*  ALBUMIN 2.8* 2.9* 2.8* 2.6* 2.7*   No results for input(s): LIPASE, AMYLASE in the last 168 hours. No results for input(s): AMMONIA in the last 168 hours. Coagulation Profile: No results for input(s): INR, PROTIME  in the last 168 hours. Cardiac Enzymes: No results for input(s): CKTOTAL, CKMB, CKMBINDEX, TROPONINI in the last 168 hours. BNP (last 3 results) No results for input(s): PROBNP in the last 8760 hours. HbA1C: No results for input(s): HGBA1C in the last 72 hours. CBG: Recent Labs  Lab 10/11/20 0749 10/11/20 1123 10/11/20 1645 10/11/20 2115 10/12/20 0731  GLUCAP 111* 82 179* 120* 91   Lipid Profile: No results for input(s): CHOL, HDL, LDLCALC, TRIG, CHOLHDL, LDLDIRECT in the last 72 hours. Thyroid Function Tests: No results for input(s): TSH, T4TOTAL, FREET4, T3FREE, THYROIDAB in the last 72 hours. Anemia Panel: Recent Labs    10/11/20 0424 10/12/20 0408  FERRITIN 481* 399*   Sepsis Labs: No results for input(s): PROCALCITON, LATICACIDVEN in the last 168 hours.  No results found for this or any previous visit (from the past 240 hour(s)).       Radiology Studies: No results found.      Scheduled Meds: . vitamin C  500 mg Oral Daily  . aspirin EC  81 mg Oral Daily  . baricitinib  4 mg Oral Daily  . buPROPion  150 mg Oral Daily  . docusate sodium  100 mg Oral BID  . enoxaparin (LOVENOX) injection  40 mg Subcutaneous Q24H  . feeding supplement  237 mL Oral BID BM  . fluticasone furoate-vilanterol  1 puff Inhalation Daily  . insulin aspart  0-9 Units Subcutaneous TID WC  . levothyroxine  50 mcg Oral QAC breakfast  . methylPREDNISolone (SOLU-MEDROL) injection  60 mg Intravenous TID  . multivitamin with minerals  1 tablet Oral Daily  . polyethylene glycol  17 g Oral Daily  . umeclidinium bromide  1 puff Inhalation Daily  . zinc sulfate  220 mg Oral Daily   Continuous Infusions:   LOS: 12 days    Time spent:40 min    Alonna Bartling, Geraldo Docker, MD [facility] Pager [phone]  If 7PM-7AM, please contact night-coverage [email or web address] Password [email or web address] 10/12/2020, 10:31 AM

## 2020-10-12 NOTE — Progress Notes (Signed)
Notified by [employer] that pt had a short burst of a tachy arrhythmia 8-10 sec long. I went to the bedside to assess pt and she reports she is sleepy but cant sleep, is restless. Offered tylenol or vistaril and she chose to take Vistaril.

## 2020-10-13 DIAGNOSIS — U071 COVID-19: Secondary | ICD-10-CM | POA: Diagnosis not present

## 2020-10-13 DIAGNOSIS — J9601 Acute respiratory failure with hypoxia: Secondary | ICD-10-CM | POA: Diagnosis not present

## 2020-10-13 LAB — GLUCOSE, CAPILLARY
Glucose-Capillary: 98 mg/dL (ref 70–99)
Glucose-Capillary: 89 mg/dL (ref 70–99)
Glucose-Capillary: 166 mg/dL — ABNORMAL HIGH (ref 70–99)
Glucose-Capillary: 166 mg/dL — ABNORMAL HIGH (ref 70–99)

## 2020-10-13 LAB — MAGNESIUM: Magnesium: 2.5 mg/dL — ABNORMAL HIGH (ref 1.7–2.4)

## 2020-10-13 LAB — D-DIMER, QUANTITATIVE: D-Dimer, Quant: 0.78 ug/mL-FEU — ABNORMAL HIGH (ref 0.00–0.50)

## 2020-10-13 LAB — PHOSPHORUS: Phosphorus: 4.4 mg/dL (ref 2.5–4.6)

## 2020-10-13 LAB — LACTATE DEHYDROGENASE: LDH: 205 U/L — ABNORMAL HIGH (ref 98–192)

## 2020-10-13 LAB — FERRITIN: Ferritin: 402 ng/mL — ABNORMAL HIGH (ref 11–307)

## 2020-10-13 LAB — C-REACTIVE PROTEIN: CRP: 1.3 mg/dL — ABNORMAL HIGH (ref <1.0)

## 2020-10-13 MED ORDER — ADULT MULTIVITAMIN LIQUID CH
15.0000 mL | Freq: Every day | ORAL | Status: DC
  Administered 2020-10-13 – 2020-10-25 (×13): 15 mL via ORAL
  Filled 2020-10-13 (×15): qty 15

## 2020-10-13 MED ORDER — AMLODIPINE BESYLATE 5 MG PO TABS
2.5000 mg | ORAL_TABLET | Freq: Every day | ORAL | Status: DC
  Administered 2020-10-13 – 2020-10-25 (×13): 2.5 mg via ORAL
  Filled 2020-10-13 (×14): qty 1

## 2020-10-13 NOTE — Progress Notes (Addendum)
Patient up to [facility] and her O2 dropped to 78%. Patient transferred from [facility] to reclining chair and sats stayed around 75-78%. After several minutes of deep breathing through her nose and out of her mouth patient oxygen remained around 78%. I increased her NRB oxygen flow from 4L - 6L. Patient oxygen sats increased to about 87% but quickly declined again around 78%. I coached patient to continue to breath in her nose and out of her mouth her sats increased to about 81%. I assisted her to lying position on her right side. Her oxygen sats are now 94% with 15L HFNC and 6L NRB supplemental oxygen. Notified Dr. Rodena Piety. I will continue to monitor patient.

## 2020-10-13 NOTE — Progress Notes (Signed)
PROGRESS NOTE    Jean Shaffer  MEQ:[id number] DOB: March 14, 1951 DOA: 09/30/2020 PCP: [name], MD   Brief Narrative: 69 year old female with history of asthma and COPD, hypertension, hypothyroidism, chronic back pain admitted 12 days ago with Covid pneumonia.  Patient has been making very slow progress since admission.  Assessment & Plan:   Active Problems:   Pneumonia due to COVID-19 virus   Acute respiratory failure with hypoxia (HCC)  #1 acute hypoxic respiratory failure secondary to Covid pneumonia status post treatment with baricitinib and remdesivir. Continue Solu-Medrol 60 mg 3 times daily with inhalers. Encourage incentive spirometry and flutter valve. Continue vitamin C and zinc. Encourage proning. Covid markers CRP 1.3, ferritin 402, LDH 205, D-dimer 0.78. Today while transferring from bedside commode to recliner chair to 75 to 78%.  She had to be placed on 15 L of high flow nasal cannula with 6 L of nonrebreather supplemental oxygen to bring up her sats above 90%.  #2 hypothyroidism continue Synthroid  #3 history of COPD continue breathing treatments  #4 abnormal LFTs resolved  #5 leukocytosis follow-up patient is on steroids  #6 history of essential hypertension blood pressure 163/71.restart Norvasc.  #7 history of depression continue Wellbutrin  #8 chronic back pain continue Norco  Nutrition Problem: Increased nutrient needs Etiology: acute illness (COVID-19 infection)     Signs/Symptoms: estimated needs    Interventions: Ensure Enlive (each supplement provides 350kcal and 20 grams of protein), MVI  Estimated body mass index is 24.98 kg/m as calculated from the following:   Height as of this encounter: 5\' 4"  (1.626 m).   Weight as of this encounter: 66 kg.  DVT prophylaxis: Lovenox  code Status: Full code Family Communication: None Disposition Plan:  Status is: Inpatient   Dispo: The patient is from: Home              Anticipated d/c is  to: SNF              Anticipated d/c date is: > 3 days              Patient currently is not medically stable to d/c.  Patient with severe Covid pneumonia severe hypoxia on IV steroids.    Consultants: None  Procedures: None  antimicrobials: None  Subjective: Patient resting in bed when I saw her earlier today later on the nurse helped her to transition from bedside commode to chair when she started desaturating.  Objective: Vitals:   10/12/20 0500 10/12/20 1400 10/12/20 2022 10/13/20 0425  BP:  124/68 (!) 146/72 (!) 163/71  Pulse:  87 80 77  Resp:  19 20 (!) 21  Temp:  98.2 F (36.8 C) 98.2 F (36.8 C) 98.8 F (37.1 C)  TempSrc:  Oral    SpO2:  94% 96% 98%  Weight: 66 kg     Height:        Intake/Output Summary (Last 24 hours) at 10/13/2020 1225 Last data filed at 10/13/2020 1223 Gross per 24 hour  Intake 590 ml  Output --  Net 590 ml   Filed Weights   10/10/20 0500 10/11/20 0531 10/12/20 0500  Weight: 64.4 kg 63.2 kg 66 kg    Examination:  General exam: Appears calm and comfortable  Respiratory system: Bilateral rhonchi to auscultation. Respiratory effort normal. Cardiovascular system: S1 & S2 heard, RRR. No JVD, murmurs, rubs, gallops or clicks. No pedal edema. Gastrointestinal system: Abdomen is nondistended, soft and nontender. No organomegaly or masses felt. Normal bowel sounds  heard. Central nervous system: Alert and oriented. No focal neurological deficits. Extremities: Symmetric 5 x 5 power. Skin: No rashes, lesions or ulcers Psychiatry: Judgement and insight appear normal. Mood & affect appropriate.     Data Reviewed: I have personally reviewed following labs and imaging studies  CBC: Recent Labs  Lab 10/08/20 0412 10/09/20 0610 10/10/20 0547 10/11/20 0424 10/12/20 0408  WBC 18.8* 24.0* 14.9* 16.8* 17.5*  NEUTROABS 16.4* 20.7* 13.1* 14.6* 15.1*  HGB 11.5* 11.8* 11.5* 11.3* 11.1*  HCT 35.2* 35.5* 35.2* 34.2* 33.6*  MCV 87.3 85.3 87.1 86.4  87.0  PLT 413* 489* 484* 496* 161*   Basic Metabolic Panel: Recent Labs  Lab 10/08/20 0412 10/08/20 0412 10/09/20 0610 10/10/20 0547 10/11/20 0424 10/12/20 0408 10/13/20 0429  NA 137  --  140 136 137 136  --   K 4.1  --  4.0 4.2 4.4 4.5  --   CL 101  --  101 100 101 99  --   CO2 26  --  27 26 27 27   --   GLUCOSE 113*  --  87 143* 128* 134*  --   BUN 28*  --  30* 26* 33* 32*  --   CREATININE 0.87  --  0.85 0.80 0.84 0.78  --   CALCIUM 9.3  --  9.3 9.2 9.4 9.3  --   MG 2.4   < > 2.3 2.5* 2.6* 2.4 2.5*  PHOS 5.3*   < > 4.7* 5.5* 4.1 4.5 4.4   < > = values in this interval not displayed.   GFR: Estimated Creatinine Clearance: 62 mL/min (by C-G formula based on SCr of 0.78 mg/dL). Liver Function Tests: Recent Labs  Lab 10/08/20 0412 10/09/20 0610 10/10/20 0547 10/11/20 0424 10/12/20 0408  AST 21 22 19 17 15   ALT 29 26 25 21 20   ALKPHOS 76 73 66 66 65  BILITOT 0.8 0.8 0.9 0.6 0.8  PROT 6.1* 6.6 6.6 6.5 6.3*  ALBUMIN 2.8* 2.9* 2.8* 2.6* 2.7*   No results for input(s): LIPASE, AMYLASE in the last 168 hours. No results for input(s): AMMONIA in the last 168 hours. Coagulation Profile: No results for input(s): INR, PROTIME in the last 168 hours. Cardiac Enzymes: No results for input(s): CKTOTAL, CKMB, CKMBINDEX, TROPONINI in the last 168 hours. BNP (last 3 results) No results for input(s): PROBNP in the last 8760 hours. HbA1C: No results for input(s): HGBA1C in the last 72 hours. CBG: Recent Labs  Lab 10/12/20 1104 10/12/20 1627 10/12/20 2023 10/13/20 0743 10/13/20 1136  GLUCAP 121* 220* 145* 98 89   Lipid Profile: No results for input(s): CHOL, HDL, LDLCALC, TRIG, CHOLHDL, LDLDIRECT in the last 72 hours. Thyroid Function Tests: No results for input(s): TSH, T4TOTAL, FREET4, T3FREE, THYROIDAB in the last 72 hours. Anemia Panel: Recent Labs    10/12/20 0408 10/13/20 0429  FERRITIN 399* 402*   Sepsis Labs: No results for input(s): PROCALCITON, LATICACIDVEN  in the last 168 hours.  No results found for this or any previous visit (from the past 240 hour(s)).       Radiology Studies: No results found.      Scheduled Meds: . vitamin C  500 mg Oral Daily  . aspirin EC  81 mg Oral Daily  . baricitinib  4 mg Oral Daily  . buPROPion  150 mg Oral Daily  . docusate sodium  100 mg Oral BID  . enoxaparin (LOVENOX) injection  40 mg Subcutaneous Q24H  . feeding supplement  237 mL Oral BID BM  . fluticasone furoate-vilanterol  1 puff Inhalation Daily  . insulin aspart  0-9 Units Subcutaneous TID WC  . levothyroxine  50 mcg Oral QAC breakfast  . methylPREDNISolone (SOLU-MEDROL) injection  60 mg Intravenous TID  . multivitamin  15 mL Oral Daily  . polyethylene glycol  17 g Oral Daily  . umeclidinium bromide  1 puff Inhalation Daily  . zinc sulfate  220 mg Oral Daily   Continuous Infusions:   LOS: 13 days     Georgette Shell, MD  10/13/2020, 12:25 PM

## 2020-10-13 NOTE — Progress Notes (Signed)
Nutrition Follow-up  INTERVENTION:   -Ensure Enlive po BID, each supplement provides 350 kcal and 20 grams of protein (could be replaced with Ensure Plus providing 350 kcals and 13g protein) -Liquid MVI -given trouble swallowing  NUTRITION DIAGNOSIS:   Increased nutrient needs related to acute illness (COVID-19 infection) as evidenced by estimated needs.  Ongoing.  GOAL:   Patient will meet greater than or equal to 90% of their needs  Progressing.  MONITOR:   PO intake, Supplement acceptance, Labs, Weight trends, I & O's  ASSESSMENT:   69 y.o. female with Past medical history of asthma and COPD, chronic back pain, HTN, hypothyroidism.Patient presented with complaints of cough and shortness of breath.  Patient started with symptoms on September 23, 2020.  Tested at [place] facility.  Presented today for monoclonal antibody infusion but was found hypoxic and therefore was transferred to ER for further work-up. Admitted for COVID-19 viral pneumonia.  COVID-19+ since 9/30.  Patient currently consuming 25-45% of meals at this time. Pt is drinking Ensure supplements.  Admission weight: 143 lbs. Current weight: 145 lbs.  Medications: Vitamin C, Colace, Multivitamin with minerals daily, Miralax, zinc sulfate  Labs reviewed: CBGs: 98-145 Elevated Mg Phos WNL  Diet Order:   Diet Order            DIET SOFT Room service appropriate? Yes; Fluid consistency: Thin  Diet effective now                 EDUCATION NEEDS:   No education needs have been identified at this time  Skin:  Skin Assessment: Reviewed RN Assessment  Last BM:  PTA  Height:   Ht Readings from Last 1 Encounters:  09/30/20 5\' 4"  (1.626 m)    Weight:   Wt Readings from Last 1 Encounters:  10/12/20 66 kg    BMI:  Body mass index is 24.98 kg/m.  Estimated Nutritional Needs:   Kcal:  1800-2000  Protein:  85-100g  Fluid:  2L/day  Jean Bibles, MS, RD, LDN Inpatient Clinical  Dietitian Contact information available via Amion

## 2020-10-13 NOTE — Progress Notes (Signed)
Physical Therapy Treatment Patient Details Name: Jean Shaffer MRN: [id number] DOB: [birth year] Today's Date: 10/13/2020    History of Present Illness Jean Shaffer is a 69 y.o. female with Past medical history of asthma and COPD, chronic back pain, HTN, hypothyroidism. Patient presented with complaints of cough and shortness of breath.  Patient started with symptoms on September 23, 2020. Admitted to hospital with COVID pneumonia.    PT Comments    Patient resting in bed on15 LHFNC , SPO2 95%.The patient reports having a bad day, could not get her breath after transfer to [facility]., even using NRB,( possibly Part NRB was on 6 not >10 L). Patient assited to [facility]  using 15 L HFNC and 15 L NRB, SPO2 remained >82% until transferred to bed, dropped to 78%, then returned to 85% withiin 1 min. . Patient reported much less SOB and less anxiousness. Appears patient requiring 15 L On PNRB to maintain saturation during activity. Continue activity as tolerating, using  Oxygen to allow more mobility and less SOB. Patient removed PNRB, , SPO2 95%.  Follow Up Recommendations  CIR;LTACH     Equipment Recommendations  None recommended by PT    Recommendations for Other Services       Precautions / Restrictions Precautions Precautions: Fall Precaution Comments: monitor sats, needing HF and Part. NRB for mobility    Mobility  Bed Mobility Overal bed mobility: Modified Independent                Transfers Overall transfer level: Needs assistance   Transfers: Sit to/from Stand Sit to Stand: Min guard Stand pivot transfers: Min guard       General transfer comment: Placed on 15 P NRB,  and 15 HF  Ambulation/Gait                 Stairs             Wheelchair Mobility    Modified Rankin (Stroke Patients Only)       Balance     Sitting balance-Leahy Scale: Good       Standing balance-Leahy Scale: Fair                              Cognition                                        General Comments: less anxious when used NRB and less SOB      Exercises      General Comments        Pertinent Vitals/Pain Pain Assessment: No/denies pain    Home Living                      Prior Function            PT Goals (current goals can now be found in the care plan section) Progress towards PT goals: Progressing toward goals    Frequency    Min 3X/week      PT Plan Current plan remains appropriate;Discharge plan needs to be updated    Co-evaluation              AM-PAC PT "6 Clicks" Mobility   Outcome Measure  Help needed turning from your back to your side while in a flat bed without using bedrails?: None Help needed  moving from lying on your back to sitting on the side of a flat bed without using bedrails?: None Help needed moving to and from a bed to a chair (including a wheelchair)?: A Little Help needed standing up from a chair using your arms (e.g., wheelchair or bedside chair)?: A Little Help needed to walk in hospital room?: A Little Help needed climbing 3-5 steps with a railing? : A Lot 6 Click Score: 19    End of Session Equipment Utilized During Treatment: Oxygen Activity Tolerance: Patient tolerated treatment well Patient left: in bed;with call bell/phone within reach Nurse Communication: Mobility status;Other (comment)       Time: 7195-9747 PT Time Calculation (min) (ACUTE ONLY): 23 min  Charges:  $Therapeutic Activity: 23-37 mins                    Tresa Endo PT Acute Rehabilitation Services Pager [phone] Office [phone]   Claretha Cooper 10/13/2020, 5:58 PM

## 2020-10-13 NOTE — TOC Progression Note (Signed)
Transition of Care [facility]) - Progression Note    Patient Details  Name: Jean Shaffer MRN: [id number] Date of Birth: [birth year]  [facility] CM/SW [place], [place] Phone Number: 10/13/2020, 2:18 PM  Clinical Narrative:  Based on recommendation of RN, contacted Ericka with [facility] to review chart.  She states that patient is an appropriate referral, but O2 need is too high to safely transfer.  Patient FI02 must be at 65% or less for transfer.  Have alerted RN and MD about this. TOC will continue to follow during the course of hospitalization.      Expected Discharge Plan: [place] Barriers to Discharge: No Barriers Identified  Expected Discharge Plan and Services Expected Discharge Plan: [place]   Discharge Planning Services: CM Consult Post Acute Care Choice: [place] arrangements for the past 2 months: Single Family Home                                       Social Determinants of Health (SDOH) Interventions    Readmission Risk Interventions No flowsheet data found.

## 2020-10-14 DIAGNOSIS — J9601 Acute respiratory failure with hypoxia: Secondary | ICD-10-CM | POA: Diagnosis not present

## 2020-10-14 LAB — FERRITIN: Ferritin: 385 ng/mL — ABNORMAL HIGH (ref 11–307)

## 2020-10-14 LAB — GLUCOSE, CAPILLARY
Glucose-Capillary: 95 mg/dL (ref 70–99)
Glucose-Capillary: 122 mg/dL — ABNORMAL HIGH (ref 70–99)
Glucose-Capillary: 121 mg/dL — ABNORMAL HIGH (ref 70–99)
Glucose-Capillary: 137 mg/dL — ABNORMAL HIGH (ref 70–99)

## 2020-10-14 LAB — LACTATE DEHYDROGENASE: LDH: 196 U/L — ABNORMAL HIGH (ref 98–192)

## 2020-10-14 LAB — C-REACTIVE PROTEIN: CRP: 0.7 mg/dL (ref <1.0)

## 2020-10-14 LAB — MAGNESIUM: Magnesium: 2.4 mg/dL (ref 1.7–2.4)

## 2020-10-14 LAB — PHOSPHORUS: Phosphorus: 3.6 mg/dL (ref 2.5–4.6)

## 2020-10-14 LAB — D-DIMER, QUANTITATIVE: D-Dimer, Quant: 0.78 ug/mL-FEU — ABNORMAL HIGH (ref 0.00–0.50)

## 2020-10-14 MED ORDER — BISACODYL 5 MG PO TBEC
10.0000 mg | DELAYED_RELEASE_TABLET | Freq: Every day | ORAL | Status: DC
  Administered 2020-10-14 – 2020-10-25 (×12): 10 mg via ORAL
  Filled 2020-10-14 (×11): qty 2

## 2020-10-14 MED ORDER — BISACODYL 10 MG RE SUPP
10.0000 mg | Freq: Once | RECTAL | Status: AC
  Administered 2020-10-15: 10 mg via RECTAL
  Filled 2020-10-14: qty 1

## 2020-10-14 NOTE — Progress Notes (Signed)
PROGRESS NOTE    FRANCESS MULLEN  MCN:[id number] DOB: 1951-07-15 DOA: 09/30/2020 PCP: [name], MD   Brief Narrative: 69 year old female with history of asthma and COPD, hypertension, hypothyroidism, chronic back pain admitted 12 days ago with Covid pneumonia.  Patient has been making very slow progress since admission.  Assessment & Plan:   Active Problems:   COVID-19   Acute respiratory failure with hypoxia (HCC)  #1 acute hypoxic respiratory failure secondary to Covid pneumonia status post treatment with baricitinib and remdesivir. Saturation 89% on 15 L.  Continue to encourage proning and incentive spirometry. Continue Solu-Medrol 60 mg 3 times daily with inhalers. Encourage incentive spirometry and flutter valve. Continue vitamin C and zinc.  #2 hypothyroidism continue Synthroid  #3 history of COPD continue breathing treatments  #4 abnormal LFTs resolved  #5 leukocytosis follow-up patient is on steroids  #6 history of essential hypertension-blood pressure 137/68 continue Norvasc.  Blood pressure improved since restarting Norvasc yesterday.   #7 history of depression continue Wellbutrin  #8 chronic back pain continue Norco  Nutrition Problem: Increased nutrient needs Etiology: acute illness (COVID-19 infection)     Signs/Symptoms: estimated needs    Interventions: Ensure Enlive (each supplement provides 350kcal and 20 grams of protein), MVI  Estimated body mass index is 24.18 kg/m as calculated from the following:   Height as of this encounter: 5\' 4"  (1.626 m).   Weight as of this encounter: 63.9 kg.  DVT prophylaxis: Lovenox  code Status: Full code Family Communication: None Disposition Plan:  Status is: Inpatient   Dispo: The patient is from: Home              Anticipated d/c is to: SNF              Anticipated d/c date is: > 3 days              Patient currently is not medically stable to d/c.  Patient with severe Covid pneumonia severe  hypoxia on IV steroids.    Consultants: None  Procedures: None  antimicrobials: None  Subjective: Patient resting in bed she is laying supine however she tells me all night she was sleeping prone No other events overnight Objective: Vitals:   10/13/20 1946 10/13/20 2132 10/14/20 0357 10/14/20 0500  BP: (!) 153/72  137/68   Pulse: 71  82   Resp: 18  (!) 21   Temp: 98 F (36.7 C)  97.7 F (36.5 C)   TempSrc: Oral  Oral   SpO2: 99% 91% (!) 89%   Weight:    63.9 kg  Height:        Intake/Output Summary (Last 24 hours) at 10/14/2020 1323 Last data filed at 10/14/2020 1022 Gross per 24 hour  Intake 354 ml  Output --  Net 354 ml   Filed Weights   10/11/20 0531 10/12/20 0500 10/14/20 0500  Weight: 63.2 kg 66 kg 63.9 kg    Examination:  General exam: Appears calm and comfortable  Respiratory system: Bilateral rhonchi to auscultation. Respiratory effort normal. Cardiovascular system: S1 & S2 heard, RRR. No JVD, murmurs, rubs, gallops or clicks. No pedal edema. Gastrointestinal system: Abdomen is nondistended, soft and nontender. No organomegaly or masses felt. Normal bowel sounds heard. Central nervous system: Alert and oriented. No focal neurological deficits. Extremities: Symmetric 5 x 5 power. Skin: No rashes, lesions or ulcers Psychiatry: Judgement and insight appear normal. Mood & affect appropriate.     Data Reviewed: I have personally reviewed following  labs and imaging studies  CBC: Recent Labs  Lab 10/08/20 0412 10/09/20 0610 10/10/20 0547 10/11/20 0424 10/12/20 0408  WBC 18.8* 24.0* 14.9* 16.8* 17.5*  NEUTROABS 16.4* 20.7* 13.1* 14.6* 15.1*  HGB 11.5* 11.8* 11.5* 11.3* 11.1*  HCT 35.2* 35.5* 35.2* 34.2* 33.6*  MCV 87.3 85.3 87.1 86.4 87.0  PLT 413* 489* 484* 496* 355*   Basic Metabolic Panel: Recent Labs  Lab 10/08/20 0412 10/08/20 0412 10/09/20 0610 10/09/20 0610 10/10/20 0547 10/11/20 0424 10/12/20 0408 10/13/20 0429 10/14/20 0400  NA  137  --  140  --  136 137 136  --   --   K 4.1  --  4.0  --  4.2 4.4 4.5  --   --   CL 101  --  101  --  100 101 99  --   --   CO2 26  --  27  --  26 27 27   --   --   GLUCOSE 113*  --  87  --  143* 128* 134*  --   --   BUN 28*  --  30*  --  26* 33* 32*  --   --   CREATININE 0.87  --  0.85  --  0.80 0.84 0.78  --   --   CALCIUM 9.3  --  9.3  --  9.2 9.4 9.3  --   --   MG 2.4   < > 2.3   < > 2.5* 2.6* 2.4 2.5* 2.4  PHOS 5.3*   < > 4.7*   < > 5.5* 4.1 4.5 4.4 3.6   < > = values in this interval not displayed.   GFR: Estimated Creatinine Clearance: 57.3 mL/min (by C-G formula based on SCr of 0.78 mg/dL). Liver Function Tests: Recent Labs  Lab 10/08/20 0412 10/09/20 0610 10/10/20 0547 10/11/20 0424 10/12/20 0408  AST 21 22 19 17 15   ALT 29 26 25 21 20   ALKPHOS 76 73 66 66 65  BILITOT 0.8 0.8 0.9 0.6 0.8  PROT 6.1* 6.6 6.6 6.5 6.3*  ALBUMIN 2.8* 2.9* 2.8* 2.6* 2.7*   No results for input(s): LIPASE, AMYLASE in the last 168 hours. No results for input(s): AMMONIA in the last 168 hours. Coagulation Profile: No results for input(s): INR, PROTIME in the last 168 hours. Cardiac Enzymes: No results for input(s): CKTOTAL, CKMB, CKMBINDEX, TROPONINI in the last 168 hours. BNP (last 3 results) No results for input(s): PROBNP in the last 8760 hours. HbA1C: No results for input(s): HGBA1C in the last 72 hours. CBG: Recent Labs  Lab 10/13/20 1136 10/13/20 1648 10/13/20 2128 10/14/20 0749 10/14/20 1109  GLUCAP 89 166* 166* 95 122*   Lipid Profile: No results for input(s): CHOL, HDL, LDLCALC, TRIG, CHOLHDL, LDLDIRECT in the last 72 hours. Thyroid Function Tests: No results for input(s): TSH, T4TOTAL, FREET4, T3FREE, THYROIDAB in the last 72 hours. Anemia Panel: Recent Labs    10/13/20 0429 10/14/20 0400  FERRITIN 402* 385*   Sepsis Labs: No results for input(s): PROCALCITON, LATICACIDVEN in the last 168 hours.  No results found for this or any previous visit (from the past  240 hour(s)).       Radiology Studies: No results found.      Scheduled Meds: . amLODipine  2.5 mg Oral Daily  . vitamin C  500 mg Oral Daily  . aspirin EC  81 mg Oral Daily  . baricitinib  4 mg Oral Daily  . buPROPion  150 mg  Oral Daily  . docusate sodium  100 mg Oral BID  . enoxaparin (LOVENOX) injection  40 mg Subcutaneous Q24H  . feeding supplement  237 mL Oral BID BM  . fluticasone furoate-vilanterol  1 puff Inhalation Daily  . insulin aspart  0-9 Units Subcutaneous TID WC  . levothyroxine  50 mcg Oral QAC breakfast  . methylPREDNISolone (SOLU-MEDROL) injection  60 mg Intravenous TID  . multivitamin  15 mL Oral Daily  . polyethylene glycol  17 g Oral Daily  . umeclidinium bromide  1 puff Inhalation Daily  . zinc sulfate  220 mg Oral Daily   Continuous Infusions:   LOS: 14 days     Georgette Shell, MD  10/14/2020, 1:23 PM

## 2020-10-14 NOTE — Progress Notes (Signed)
Inpatient Rehab Admissions Coordinator Note:   Per PT recommendations, pt was screened for CIR candidacy by Shann Medal, PT, DPT.  At this time note pt still requiring significant supplemental O2 to maintain saturations at rest and with mobility.  Will not request an order at this time; however I will follow from a distance and monitor patient's progress.  If she demonstrates improvement in O2 saturations with decreasing supplemental needs we will request a consult at that time.  Please contact me with questions.   Shann Medal, PT, DPT [phone] 10/14/20 10:16 AM

## 2020-10-15 DIAGNOSIS — J9601 Acute respiratory failure with hypoxia: Secondary | ICD-10-CM | POA: Diagnosis not present

## 2020-10-15 DIAGNOSIS — U071 COVID-19: Secondary | ICD-10-CM | POA: Diagnosis not present

## 2020-10-15 LAB — GLUCOSE, CAPILLARY
Glucose-Capillary: 98 mg/dL (ref 70–99)
Glucose-Capillary: 77 mg/dL (ref 70–99)
Glucose-Capillary: 118 mg/dL — ABNORMAL HIGH (ref 70–99)
Glucose-Capillary: 169 mg/dL — ABNORMAL HIGH (ref 70–99)

## 2020-10-15 LAB — C-REACTIVE PROTEIN: CRP: 0.6 mg/dL (ref <1.0)

## 2020-10-15 LAB — FERRITIN: Ferritin: 381 ng/mL — ABNORMAL HIGH (ref 11–307)

## 2020-10-15 LAB — MAGNESIUM: Magnesium: 2.6 mg/dL — ABNORMAL HIGH (ref 1.7–2.4)

## 2020-10-15 LAB — D-DIMER, QUANTITATIVE: D-Dimer, Quant: 0.7 ug/mL-FEU — ABNORMAL HIGH (ref 0.00–0.50)

## 2020-10-15 LAB — LACTATE DEHYDROGENASE: LDH: 183 U/L (ref 98–192)

## 2020-10-15 LAB — PHOSPHORUS: Phosphorus: 4.5 mg/dL (ref 2.5–4.6)

## 2020-10-15 NOTE — Progress Notes (Signed)
Physical Therapy Treatment Patient Details Name: Jean Shaffer MRN: [id number] DOB: [birth year] Today's Date: 10/15/2020    History of Present Illness 69 y.o. female with Past medical history of asthma and COPD, chronic back pain, HTN, hypothyroidism. Patient presented with complaints of cough and shortness of breath.  Patient started with symptoms on September 23, 2020. Admitted to hospital with COVID pneumonia.    PT Comments    Pt is motivated to mobilize and get better. She continues to require HFNC(15L) + O2 mask(15L) for activity. O2 75% with activity. End of session: 83% on HFNC + O2 mask (instructed pt to keep wearing mask until she felt fully recovered). Will continue to follow and progress activity as pt is able to tolerate. CIR has been recommended but she is not yet stable enough to go there.   Follow Up Recommendations  LTACH     Equipment Recommendations  None recommended by PT    Recommendations for Other Services       Precautions / Restrictions Precautions Precautions: Fall Precaution Comments: monitor sats. wearing HFNC and O2 mask for activity Restrictions Weight Bearing Restrictions: No    Mobility  Bed Mobility Overal bed mobility: Independent             General bed mobility comments: oob in recliner  Transfers Overall transfer level: Needs assistance Equipment used: Rolling walker (2 wheeled) Transfers: Sit to/from Stand Sit to Stand: Min guard        General transfer comment: Min guard for safety. Cues for safety, hand placement, pacing  Ambulation/Gait Ambulation/Gait assistance: Min assist Gait Distance (Feet): 30 Feet (x2) Assistive device: Rolling walker (2 wheeled) Gait Pattern/deviations: Step-through pattern;Decreased stride length     General Gait Details: Intermittent unsteadiness-worsens when pt is fatigued. O2 75% on HFNC+ O2 mask. Cues for deep breathing.   Stairs             Wheelchair Mobility     Modified Rankin (Stroke Patients Only)       Balance Overall balance assessment: Mild deficits observed, not formally tested         Standing balance support: Bilateral upper extremity supported Standing balance-Leahy Scale: Fair                              Cognition Arousal/Alertness: Awake/alert Behavior During Therapy: WFL for tasks assessed/performed Overall Cognitive Status: Within Functional Limits for tasks assessed                                        Exercises      General Comments        Pertinent Vitals/Pain Pain Assessment: No/denies pain    Home Living                      Prior Function            PT Goals (current goals can now be found in the care plan section) Acute Rehab PT Goals Patient Stated Goal: to feel better Progress towards PT goals: Progressing toward goals    Frequency    Min 3X/week      PT Plan Current plan remains appropriate    Co-evaluation              AM-PAC PT "6 Clicks" Mobility   Outcome Measure  Help needed  turning from your back to your side while in a flat bed without using bedrails?: None Help needed moving from lying on your back to sitting on the side of a flat bed without using bedrails?: None Help needed moving to and from a bed to a chair (including a wheelchair)?: A Little Help needed standing up from a chair using your arms (e.g., wheelchair or bedside chair)?: A Little Help needed to walk in hospital room?: A Little Help needed climbing 3-5 steps with a railing? : A Lot 6 Click Score: 19    End of Session Equipment Utilized During Treatment: Oxygen Activity Tolerance: Patient tolerated treatment well Patient left: in chair;with call bell/phone within reach   PT Visit Diagnosis: Difficulty in walking, not elsewhere classified (R26.2);Unsteadiness on feet (R26.81)     Time: 7867-6720 PT Time Calculation (min) (ACUTE ONLY): 30 min  Charges:   $Gait Training: 23-37 mins                       Doreatha Massed, PT Acute Rehabilitation  Office: [phone] Pager: [phone]

## 2020-10-15 NOTE — Progress Notes (Signed)
Patient attempted to have a BM post suppository. Stool was right at the rectum but she could not push it out. This nurse helped patient pass stool w/ very minimal manual disimpaction. Notified charge nurse Wellsite geologist.

## 2020-10-15 NOTE — Progress Notes (Signed)
PROGRESS NOTE    Jean Shaffer  LYY:[id number] DOB: [birth year] DOA: 09/30/2020 PCP: Jean Mires, MD   Brief Narrative: 69 year old female with history of asthma and COPD, hypertension, hypothyroidism, chronic back pain admitted 12 days ago with Covid pneumonia.  Patient has been making very slow progress since admission.  Assessment & Plan:   Active Problems:   COVID-19   Acute respiratory failure with hypoxia (HCC)  #1 acute hypoxic respiratory failure secondary to Covid pneumonia status post treatment with baricitinib and remdesivir. Patient remains on high flow nasal cannula at 15 L saturating 91%. Patient is encouraged to move around in the room has been sleeping prone and sitting up and moving around in the room. Continue Solu-Medrol 60 mg 3 times a day.  Continue inhalers.  Continue to encourage incentive spirometry and flutter valve.  Continue zinc and vitamin C.  #2 hypothyroidism continue Synthroid  #3 history of COPD continue breathing treatments  #4 abnormal LFTs resolved  #5 leukocytosis follow-up patient is on steroids  #6 history of essential hypertension-continue Norvasc.  Blood pressure 144/80.  #7 history of depression continue Wellbutrin  #8 chronic back pain continue Norco  Nutrition Problem: Increased nutrient needs Etiology: acute illness (COVID-19 infection)     Signs/Symptoms: estimated needs    Interventions: Ensure Enlive (each supplement provides 350kcal and 20 grams of protein), MVI  Estimated body mass index is 24.22 kg/m as calculated from the following:   Height as of this encounter: 5\' 4"  (1.626 m).   Weight as of this encounter: 64 kg.  DVT prophylaxis: Lovenox  code Status: Full code Family Communication: None Disposition Plan:  Status is: Inpatient   Dispo: The patient is from: Home              Anticipated d/c is to: SNF              Anticipated d/c date is: > 3 days              Patient currently is not medically  stable to d/c.  Patient with severe Covid pneumonia severe hypoxia on IV steroids.    Consultants: None  Procedures: None  antimicrobials: None  Subjective: She is resting in bed much more awake and alert than yesterday.  She wants to get better and she wants to go home.  She is willing to ambulate in the hallway or in the room and she is asking for more help to get out of the bed.  Objective: Vitals:   10/15/20 0108 10/15/20 0326 10/15/20 0406 10/15/20 0500  BP:  (!) 144/80    Pulse: 63 87 75   Resp:  (!) 24    Temp:  98 F (36.7 C)    TempSrc:  Axillary    SpO2: 97% 91% 91%   Weight:    64 kg  Height:        Intake/Output Summary (Last 24 hours) at 10/15/2020 1211 Last data filed at 10/14/2020 2125 Gross per 24 hour  Intake 472 ml  Output --  Net 472 ml   Filed Weights   10/12/20 0500 10/14/20 0500 10/15/20 0500  Weight: 66 kg 63.9 kg 64 kg    Examination:  General exam: Appears calm and comfortable  Respiratory system: Bilateral rhonchi to auscultation. Respiratory effort normal. Cardiovascular system: S1 & S2 heard, RRR. No JVD, murmurs, rubs, gallops or clicks. No pedal edema. Gastrointestinal system: Abdomen is nondistended, soft and nontender. No organomegaly or masses felt. Normal bowel sounds  heard. Central nervous system: Alert and oriented. No focal neurological deficits. Extremities: Symmetric 5 x 5 power. Skin: No rashes, lesions or ulcers Psychiatry: Judgement and insight appear normal. Mood & affect appropriate.     Data Reviewed: I have personally reviewed following labs and imaging studies  CBC: Recent Labs  Lab 10/09/20 0610 10/10/20 0547 10/11/20 0424 10/12/20 0408  WBC 24.0* 14.9* 16.8* 17.5*  NEUTROABS 20.7* 13.1* 14.6* 15.1*  HGB 11.8* 11.5* 11.3* 11.1*  HCT 35.5* 35.2* 34.2* 33.6*  MCV 85.3 87.1 86.4 87.0  PLT 489* 484* 496* 283*   Basic Metabolic Panel: Recent Labs  Lab 10/09/20 0610 10/09/20 0610 10/10/20 0547  10/10/20 0547 10/11/20 0424 10/12/20 0408 10/13/20 0429 10/14/20 0400 10/15/20 0355  NA 140  --  136  --  137 136  --   --   --   K 4.0  --  4.2  --  4.4 4.5  --   --   --   CL 101  --  100  --  101 99  --   --   --   CO2 27  --  26  --  27 27  --   --   --   GLUCOSE 87  --  143*  --  128* 134*  --   --   --   BUN 30*  --  26*  --  33* 32*  --   --   --   CREATININE 0.85  --  0.80  --  0.84 0.78  --   --   --   CALCIUM 9.3  --  9.2  --  9.4 9.3  --   --   --   MG 2.3   < > 2.5*   < > 2.6* 2.4 2.5* 2.4 2.6*  PHOS 4.7*   < > 5.5*   < > 4.1 4.5 4.4 3.6 4.5   < > = values in this interval not displayed.   GFR: Estimated Creatinine Clearance: 57.3 mL/min (by C-G formula based on SCr of 0.78 mg/dL). Liver Function Tests: Recent Labs  Lab 10/09/20 0610 10/10/20 0547 10/11/20 0424 10/12/20 0408  AST 22 19 17 15   ALT 26 25 21 20   ALKPHOS 73 66 66 65  BILITOT 0.8 0.9 0.6 0.8  PROT 6.6 6.6 6.5 6.3*  ALBUMIN 2.9* 2.8* 2.6* 2.7*   No results for input(s): LIPASE, AMYLASE in the last 168 hours. No results for input(s): AMMONIA in the last 168 hours. Coagulation Profile: No results for input(s): INR, PROTIME in the last 168 hours. Cardiac Enzymes: No results for input(s): CKTOTAL, CKMB, CKMBINDEX, TROPONINI in the last 168 hours. BNP (last 3 results) No results for input(s): PROBNP in the last 8760 hours. HbA1C: No results for input(s): HGBA1C in the last 72 hours. CBG: Recent Labs  Lab 10/14/20 1109 10/14/20 1702 10/14/20 2119 10/15/20 0752 10/15/20 1146  GLUCAP 122* 121* 137* 98 77   Lipid Profile: No results for input(s): CHOL, HDL, LDLCALC, TRIG, CHOLHDL, LDLDIRECT in the last 72 hours. Thyroid Function Tests: No results for input(s): TSH, T4TOTAL, FREET4, T3FREE, THYROIDAB in the last 72 hours. Anemia Panel: Recent Labs    10/14/20 0400 10/15/20 0355  FERRITIN 385* 381*   Sepsis Labs: No results for input(s): PROCALCITON, LATICACIDVEN in the last 168  hours.  No results found for this or any previous visit (from the past 240 hour(s)).       Radiology Studies: No results found.  Scheduled Meds: . amLODipine  2.5 mg Oral Daily  . vitamin C  500 mg Oral Daily  . aspirin EC  81 mg Oral Daily  . bisacodyl  10 mg Oral Daily  . bisacodyl  10 mg Rectal Once  . buPROPion  150 mg Oral Daily  . docusate sodium  100 mg Oral BID  . enoxaparin (LOVENOX) injection  40 mg Subcutaneous Q24H  . feeding supplement  237 mL Oral BID BM  . fluticasone furoate-vilanterol  1 puff Inhalation Daily  . insulin aspart  0-9 Units Subcutaneous TID WC  . levothyroxine  50 mcg Oral QAC breakfast  . methylPREDNISolone (SOLU-MEDROL) injection  60 mg Intravenous TID  . multivitamin  15 mL Oral Daily  . polyethylene glycol  17 g Oral Daily  . umeclidinium bromide  1 puff Inhalation Daily  . zinc sulfate  220 mg Oral Daily   Continuous Infusions:   LOS: 15 days     Georgette Shell, MD  10/15/2020, 12:11 PM

## 2020-10-15 NOTE — Progress Notes (Signed)
Occupational Therapy Treatment Patient Details Name: Jean Shaffer MRN: [id number] DOB: [birth year] Today's Date: 10/15/2020    History of present illness Jean Shaffer is a 69 y.o. female with Past medical history of asthma and COPD, chronic back pain, HTN, hypothyroidism. Patient presented with complaints of cough and shortness of breath.  Patient started with symptoms on September 23, 2020. Admitted to hospital with COVID pneumonia.   OT comments  Treatment focused on improving patient's activity tolerance and advancing mobility to away from the bed. Patient ambulated to bathroom on 15 HFNC and 15 L PNRB with sat dropping to 72% and patient significantly fatigued. Patient min guard for toilet transfer and toileting but did not have enough energy to stand at sink for grooming. Patient washed face and hands seated on BSC. Required rest break to recover. Ambulated back to recliner with min assist from therapist and o2 sat down to 69%. Patient required assistance to open food containers for breakfast. Patient asking therapist why she has to sit in the chair and therapist educated patient on the importance of positioning in regards to lung recover, the need for mobility, and the use of exercise to improve overall strength and endurance. Patient verbalized understanding. Patient has made minimal progress toward goals due to continued fatigue and need for high level of oxygenation and prolonged hospitilization. POC adjusted to recommend short term rehab at discharge.  Would need to increase activity tolerance and reduce oxygen needs while in hospital to be a candidate for CIR at this time.    Follow Up Recommendations  SNF ; CIR   Equipment Recommendations  Tub/shower seat    Recommendations for Other Services      Precautions / Restrictions Precautions Precautions: Fall Precaution Comments: monitor sats, needing HF and Part. NRB for mobility Restrictions Weight Bearing Restrictions: No        Mobility Bed Mobility Overal bed mobility: Independent             General bed mobility comments: independent to transfer to side of bed. verbal cues to slow movement down to conserve energy.  Transfers   Equipment used: 1 person hand held assist Transfers: Sit to/from Stand Sit to Stand: Min assist Stand pivot transfers: Mod assist       General transfer comment: 15 P NRB,  and 15 HF to ambulate to bathroom with min assist for steadying.    Balance Overall balance assessment: Mild deficits observed, not formally tested                                         ADL either performed or assessed with clinical judgement   ADL Overall ADL's : Needs assistance/impaired Eating/Feeding: Set up;Sitting Eating/Feeding Details (indicate cue type and reason): required set up to open food containers. patient seated in recliner. Grooming: Sitting;Wash/dry face;Wash/dry hands Grooming Details (indicate cue type and reason): washed face and hands seated on toilet in bathroom due to fatigue from walk to bathroom.                 Toilet Transfer: Minimal assistance;Regular Toilet;Grab bars;Ambulation Toilet Transfer Details (indicate cue type and reason): Ambulated to bathroom with min assist for steadying on 15 L HFNC and 15 L NRB. Fatigued quickly and o2 sat dropped to 72%. Toileting- Clothing Manipulation and Hygiene: Sitting/lateral lean;Min guard Toileting - Clothing Manipulation Details (indicate cue type and reason): Patient able  to manage clothing and performed pericare seated on toilet with side leands. min guard provided.             Vision       Perception     Praxis      Cognition Arousal/Alertness: Awake/alert Behavior During Therapy: WFL for tasks assessed/performed Overall Cognitive Status: Within Functional Limits for tasks assessed                                          Exercises     Shoulder Instructions        General Comments      Pertinent Vitals/ Pain       Pain Assessment: No/denies pain  Home Living                                          Prior Functioning/Environment              Frequency  Min 2X/week        Progress Toward Goals  OT Goals(current goals can now be found in the care plan section)  Progress towards OT goals: Progressing toward goals  Acute Rehab OT Goals Patient Stated Goal: to feel better OT Goal Formulation: With patient Time For Goal Achievement: 10/29/20 Potential to Achieve Goals: Good  Plan Discharge plan needs to be updated    Co-evaluation                 AM-PAC OT "6 Clicks" Daily Activity     Outcome Measure   Help from another person eating meals?: A Little Help from another person taking care of personal grooming?: A Little Help from another person toileting, which includes using toliet, bedpan, or urinal?: A Little Help from another person bathing (including washing, rinsing, drying)?: A Little Help from another person to put on and taking off regular upper body clothing?: A Little Help from another person to put on and taking off regular lower body clothing?: A Little 6 Click Score: 18    End of Session Equipment Utilized During Treatment: Oxygen  OT Visit Diagnosis: Muscle weakness (generalized) (M62.81)   Activity Tolerance Patient limited by fatigue   Patient Left in chair;with call bell/phone within reach   Nurse Communication  (o2 status, needs water in salter,)        Time: 6433-2951 OT Time Calculation (min): 29 min  Charges: OT General Charges $OT Visit: 1 Visit OT Treatments $Self Care/Home Management : 23-37 mins  Archer Moist, OTR/L [place]  Office [phone] Pager: [place] 10/15/2020, 10:25 AM

## 2020-10-16 ENCOUNTER — Inpatient Hospital Stay (HOSPITAL_COMMUNITY): Payer: Medicare Other

## 2020-10-16 DIAGNOSIS — J9601 Acute respiratory failure with hypoxia: Secondary | ICD-10-CM | POA: Diagnosis not present

## 2020-10-16 LAB — C-REACTIVE PROTEIN: CRP: 0.5 mg/dL (ref <1.0)

## 2020-10-16 LAB — GLUCOSE, CAPILLARY
Glucose-Capillary: 88 mg/dL (ref 70–99)
Glucose-Capillary: 137 mg/dL — ABNORMAL HIGH (ref 70–99)
Glucose-Capillary: 103 mg/dL — ABNORMAL HIGH (ref 70–99)
Glucose-Capillary: 126 mg/dL — ABNORMAL HIGH (ref 70–99)

## 2020-10-16 LAB — FERRITIN: Ferritin: 415 ng/mL — ABNORMAL HIGH (ref 11–307)

## 2020-10-16 LAB — D-DIMER, QUANTITATIVE: D-Dimer, Quant: 0.61 ug/mL-FEU — ABNORMAL HIGH (ref 0.00–0.50)

## 2020-10-16 LAB — MAGNESIUM: Magnesium: 2.4 mg/dL (ref 1.7–2.4)

## 2020-10-16 LAB — PHOSPHORUS: Phosphorus: 3.8 mg/dL (ref 2.5–4.6)

## 2020-10-16 LAB — LACTATE DEHYDROGENASE: LDH: 183 U/L (ref 98–192)

## 2020-10-16 NOTE — Progress Notes (Addendum)
PROGRESS NOTE    Jean Shaffer  OIZ:[id number] DOB: 1951-07-25 DOA: 09/30/2020 PCP: [name], MD   Brief Narrative: 69 year old female with history of asthma and COPD, hypertension, hypothyroidism, chronic back pain admitted 12 days ago with Covid pneumonia.  Patient has been making very slow progress since admission.  Assessment & Plan:   Active Problems:   COVID-19   Acute respiratory failure with hypoxia (HCC)  #1 acute hypoxic respiratory failure secondary to Covid pneumonia status post treatment with baricitinib and remdesivir. Patient remains on high flow nasal cannula at 15 L saturating 91%. Decrease the oxygen to 40 L while I was in the room. Will monitor closely. Continue to encourage patient to get in and around the room unfortunately she cannot walk outside the room. Continue steroids inhalers incentive spirometry and flutter valve zinc and vitamin C. Repeat chest x-ray today shows increasing infiltrates however clinically she has improved  #2 hypothyroidism continue Synthroid  #3 history of COPD continue breathing treatments  #4 abnormal LFTs resolved  #5 leukocytosis follow-up patient is on steroids  #6 history of essential hypertension-blood pressure 143/77 continue Norvasc.  #7 history of depression continue Wellbutrin  #8 chronic back pain continue Norco  #9 constipation continue all the stool softeners including Colace Dulcolax and MiraLAX  Nutrition Problem: Increased nutrient needs Etiology: acute illness (COVID-19 infection)  Signs/Symptoms: estimated needs  Interventions: Ensure Enlive (each supplement provides 350kcal and 20 grams of protein), MVI  Estimated body mass index is 23.61 kg/m as calculated from the following:   Height as of this encounter: 5\' 4"  (1.626 m).   Weight as of this encounter: 62.4 kg.  DVT prophylaxis: Lovenox  code Status: Full code Family Communication: None Disposition Plan:  Status is: Inpatient   Dispo: The  patient is from: Home              Anticipated d/c is to: SNF              Anticipated d/c date is: > 3 days              Patient currently is not medically stable to d/c.  Patient with severe Covid pneumonia severe hypoxia on IV steroids.    Consultants: None  Procedures: None  antimicrobials: None  Subjective: Patient resting in bed supine she reports she sleeps on her belly all night. She looks better than yesterday and she feels better than yesterday. Continues to be short of breath on 15 L of oxygen.   Objective: Vitals:   10/15/20 1337 10/15/20 2159 10/16/20 0500 10/16/20 0507  BP: 121/61 135/64  (!) 143/77  Pulse: 97 81  83  Resp: 20 20  (!) 22  Temp: 98 F (36.7 C) 97.8 F (36.6 C)  98 F (36.7 C)  TempSrc: Oral Oral  Oral  SpO2: 95% 98%  90%  Weight:   62.4 kg   Height:        Intake/Output Summary (Last 24 hours) at 10/16/2020 1210 Last data filed at 10/16/2020 0800 Gross per 24 hour  Intake 240 ml  Output 1 ml  Net 239 ml   Filed Weights   10/14/20 0500 10/15/20 0500 10/16/20 0500  Weight: 63.9 kg 64 kg 62.4 kg    Examination:  General exam: Appears calm and comfortable  Respiratory system: Bilateral rhonchi to auscultation. Respiratory effort normal. Cardiovascular system: S1 & S2 heard, RRR. No JVD, murmurs, rubs, gallops or clicks. No pedal edema. Gastrointestinal system: Abdomen is nondistended, soft  and nontender. No organomegaly or masses felt. Normal bowel sounds heard. Central nervous system: Alert and oriented. No focal neurological deficits. Extremities: Symmetric 5 x 5 power. Skin: No rashes, lesions or ulcers Psychiatry: Judgement and insight appear normal. Mood & affect appropriate.     Data Reviewed: I have personally reviewed following labs and imaging studies  CBC: Recent Labs  Lab 10/10/20 0547 10/11/20 0424 10/12/20 0408  WBC 14.9* 16.8* 17.5*  NEUTROABS 13.1* 14.6* 15.1*  HGB 11.5* 11.3* 11.1*  HCT 35.2* 34.2* 33.6*  MCV  87.1 86.4 87.0  PLT 484* 496* 765*   Basic Metabolic Panel: Recent Labs  Lab 10/10/20 0547 10/10/20 0547 10/11/20 0424 10/11/20 0424 10/12/20 0408 10/13/20 0429 10/14/20 0400 10/15/20 0355 10/16/20 0359  NA 136  --  137  --  136  --   --   --   --   K 4.2  --  4.4  --  4.5  --   --   --   --   CL 100  --  101  --  99  --   --   --   --   CO2 26  --  27  --  27  --   --   --   --   GLUCOSE 143*  --  128*  --  134*  --   --   --   --   BUN 26*  --  33*  --  32*  --   --   --   --   CREATININE 0.80  --  0.84  --  0.78  --   --   --   --   CALCIUM 9.2  --  9.4  --  9.3  --   --   --   --   MG 2.5*   < > 2.6*   < > 2.4 2.5* 2.4 2.6* 2.4  PHOS 5.5*   < > 4.1   < > 4.5 4.4 3.6 4.5 3.8   < > = values in this interval not displayed.   GFR: Estimated Creatinine Clearance: 57.3 mL/min (by C-G formula based on SCr of 0.78 mg/dL). Liver Function Tests: Recent Labs  Lab 10/10/20 0547 10/11/20 0424 10/12/20 0408  AST 19 17 15   ALT 25 21 20   ALKPHOS 66 66 65  BILITOT 0.9 0.6 0.8  PROT 6.6 6.5 6.3*  ALBUMIN 2.8* 2.6* 2.7*   No results for input(s): LIPASE, AMYLASE in the last 168 hours. No results for input(s): AMMONIA in the last 168 hours. Coagulation Profile: No results for input(s): INR, PROTIME in the last 168 hours. Cardiac Enzymes: No results for input(s): CKTOTAL, CKMB, CKMBINDEX, TROPONINI in the last 168 hours. BNP (last 3 results) No results for input(s): PROBNP in the last 8760 hours. HbA1C: No results for input(s): HGBA1C in the last 72 hours. CBG: Recent Labs  Lab 10/15/20 1146 10/15/20 1615 10/15/20 2200 10/16/20 0744 10/16/20 1151  GLUCAP 77 118* 169* 88 137*   Lipid Profile: No results for input(s): CHOL, HDL, LDLCALC, TRIG, CHOLHDL, LDLDIRECT in the last 72 hours. Thyroid Function Tests: No results for input(s): TSH, T4TOTAL, FREET4, T3FREE, THYROIDAB in the last 72 hours. Anemia Panel: Recent Labs    10/15/20 0355 10/16/20 0359  FERRITIN 381*  415*   Sepsis Labs: No results for input(s): PROCALCITON, LATICACIDVEN in the last 168 hours.  No results found for this or any previous visit (from the past 240 hour(s)).  Radiology Studies: DG Chest 1 View  Result Date: 10/16/2020 CLINICAL DATA:  Shortness of breath.  COVID positive. EXAM: CHEST  1 VIEW COMPARISON:  10/09/2020 and prior radiographs FINDINGS: The cardiomediastinal silhouette is unremarkable. Bilateral airspace opacities are slightly increased. No pleural effusion or pneumothorax. No other significant changes identified. IMPRESSION: Slightly increased bilateral airspace opacities/pneumonia. Electronically Signed   By: Margarette Canada M.D.   On: 10/16/2020 12:06        Scheduled Meds: . amLODipine  2.5 mg Oral Daily  . vitamin C  500 mg Oral Daily  . aspirin EC  81 mg Oral Daily  . bisacodyl  10 mg Oral Daily  . buPROPion  150 mg Oral Daily  . docusate sodium  100 mg Oral BID  . enoxaparin (LOVENOX) injection  40 mg Subcutaneous Q24H  . feeding supplement  237 mL Oral BID BM  . fluticasone furoate-vilanterol  1 puff Inhalation Daily  . insulin aspart  0-9 Units Subcutaneous TID WC  . levothyroxine  50 mcg Oral QAC breakfast  . methylPREDNISolone (SOLU-MEDROL) injection  60 mg Intravenous TID  . multivitamin  15 mL Oral Daily  . polyethylene glycol  17 g Oral Daily  . umeclidinium bromide  1 puff Inhalation Daily  . zinc sulfate  220 mg Oral Daily   Continuous Infusions:   LOS: 16 days     Georgette Shell, MD  10/16/2020, 12:10 PM

## 2020-10-17 DIAGNOSIS — J9601 Acute respiratory failure with hypoxia: Secondary | ICD-10-CM | POA: Diagnosis not present

## 2020-10-17 DIAGNOSIS — U071 COVID-19: Secondary | ICD-10-CM | POA: Diagnosis not present

## 2020-10-17 LAB — GLUCOSE, CAPILLARY
Glucose-Capillary: 125 mg/dL — ABNORMAL HIGH (ref 70–99)
Glucose-Capillary: 94 mg/dL (ref 70–99)
Glucose-Capillary: 111 mg/dL — ABNORMAL HIGH (ref 70–99)
Glucose-Capillary: 157 mg/dL — ABNORMAL HIGH (ref 70–99)

## 2020-10-17 LAB — FERRITIN: Ferritin: 351 ng/mL — ABNORMAL HIGH (ref 11–307)

## 2020-10-17 LAB — D-DIMER, QUANTITATIVE: D-Dimer, Quant: 0.53 ug/mL-FEU — ABNORMAL HIGH (ref 0.00–0.50)

## 2020-10-17 LAB — LACTATE DEHYDROGENASE: LDH: 165 U/L (ref 98–192)

## 2020-10-17 LAB — PHOSPHORUS: Phosphorus: 3.8 mg/dL (ref 2.5–4.6)

## 2020-10-17 LAB — MAGNESIUM: Magnesium: 2.3 mg/dL (ref 1.7–2.4)

## 2020-10-17 LAB — C-REACTIVE PROTEIN: CRP: 0.6 mg/dL (ref <1.0)

## 2020-10-17 MED ORDER — SENNA 8.6 MG PO TABS
1.0000 | ORAL_TABLET | Freq: Every day | ORAL | Status: DC | PRN
  Administered 2020-10-17: 8.6 mg via ORAL
  Filled 2020-10-17 (×2): qty 1

## 2020-10-17 NOTE — Progress Notes (Signed)
Occupational Therapy Treatment Patient Details Name: Jean Shaffer MRN: [id number] DOB: 08-19-1951 Today's Date: 10/17/2020    History of present illness 69 y.o. female with Past medical history of asthma and COPD, chronic back pain, HTN, hypothyroidism. Patient presented with complaints of cough and shortness of breath.  Patient started with symptoms on September 23, 2020. Admitted to hospital with COVID pneumonia.   OT comments  Treatment focused on improving activity tolerance needed for self care tasks. Patient performed ambulation in room on 15 L HFNC and required NRB to recover after sat dropped to 83%. Ambulated a second lap in room with NRB as well - oxygen dropped to 83% and required increased time to recover. Patient tolerating mobility and activity better - and not so fatigued with limited activity. Patient progressing towards goals. Patient reports "I have to be able to take care of myself at home. My family works" and is agreeable to short term rehab if needed.   Follow Up Recommendations  SNF    Equipment Recommendations  Tub/shower seat    Recommendations for Other Services      Precautions / Restrictions Precautions Precautions: Fall Precaution Comments: monitor sats. wearing HFNC and O2 mask for activity Restrictions Weight Bearing Restrictions: No       Mobility Bed Mobility               General bed mobility comments: oob in recliner  Transfers Overall transfer level: Needs assistance Equipment used: Rolling walker (2 wheeled) Transfers: Sit to/from Stand Sit to Stand: Min guard         General transfer comment: Patient min guard for all ambulation in room. To bouts of 23 feet in room with use RW with no overt loss of balance. Ambulation on HFNC15 L on lap one and requiredNRB to recover from o2 sat drop to 83%. Used NRB with second ambulation. Once again dropped to 83%. Recovered initially to 90% but then dropped in seated position.    Balance  Overall balance assessment: Mild deficits observed, not formally tested                                         ADL either performed or assessed with clinical judgement   ADL                                               Vision       Perception     Praxis      Cognition Arousal/Alertness: Awake/alert Behavior During Therapy: WFL for tasks assessed/performed Overall Cognitive Status: Within Functional Limits for tasks assessed                                          Exercises     Shoulder Instructions       General Comments      Pertinent Vitals/ Pain       Pain Assessment: No/denies pain  Home Living  Prior Functioning/Environment              Frequency  Min 2X/week        Progress Toward Goals  OT Goals(current goals can now be found in the care plan section)  Progress towards OT goals: Progressing toward goals  Acute Rehab OT Goals OT Goal Formulation: With patient Time For Goal Achievement: 10/29/20 Potential to Achieve Goals: Good  Plan Discharge plan remains appropriate    Co-evaluation                 AM-PAC OT "6 Clicks" Daily Activity     Outcome Measure   Help from another person eating meals?: A Little Help from another person taking care of personal grooming?: A Little Help from another person toileting, which includes using toliet, bedpan, or urinal?: A Little Help from another person bathing (including washing, rinsing, drying)?: A Little Help from another person to put on and taking off regular upper body clothing?: A Little Help from another person to put on and taking off regular lower body clothing?: A Little 6 Click Score: 18    End of Session Equipment Utilized During Treatment: Oxygen;Rolling walker  OT Visit Diagnosis: Muscle weakness (generalized) (M62.81)   Activity Tolerance Patient tolerated  treatment well   Patient Left in chair;with call bell/phone within reach   Nurse Communication  (okay to see per RN)        Time: 1025-8527 OT Time Calculation (min): 17 min  Charges: OT General Charges $OT Visit: 1 Visit OT Treatments $Therapeutic Activity: 8-22 mins  Derl Barrow, OTR/L [place]  Office [phone] Pager: [place] 10/17/2020, 4:35 PM

## 2020-10-17 NOTE — Progress Notes (Signed)
PROGRESS NOTE    Jean Shaffer  RKY:[id number] DOB: 07/06/1951 DOA: 09/30/2020 PCP: [name], MD   Brief Narrative: 69 year old female with history of asthma and COPD, hypertension, hypothyroidism, chronic back pain admitted 12 days ago with Covid pneumonia.  Patient has been making very slow progress since admission.  Assessment & Plan:   Active Problems:   COVID-19   Acute respiratory failure with hypoxia (HCC)  #1 acute hypoxic respiratory failure secondary to Covid pneumonia status post treatment with baricitinib and remdesivir. Patient remains on high flow nasal cannula at 15 L saturating 91%.Continue to encourage patient to get in and around the room unfortunately she cannot walk outside the room. Continue steroids inhalers incentive spirometry and flutter valve zinc and vitamin C. Repeat chest x-ray today shows increasing infiltrates however clinically she has improved CRP 0.6/ferritin 351/LDH 165/magnesium 2.3/phosphorus 3.8/D-dimer 0.53.  Will stop doing daily labs for now. Check labs as needed.  #2 hypothyroidism continue Synthroid  #3 history of COPD continue breathing treatments  #4 abnormal LFTs resolved  #5 leukocytosis follow-up patient is on steroids  #6 history of essential hypertension-blood pressure 137/71.  Continue Norvasc.  #7 history of depression continue Wellbutrin  #8 chronic back pain continue Norco  #9 constipation continue all the stool softeners including Colace Dulcolax and MiraLAX  Nutrition Problem: Increased nutrient needs Etiology: acute illness (COVID-19 infection)  Signs/Symptoms: estimated needs  Interventions: Ensure Enlive (each supplement provides 350kcal and 20 grams of protein), MVI  Estimated body mass index is 23.65 kg/m as calculated from the following:   Height as of this encounter: 5\' 4"  (1.626 m).   Weight as of this encounter: 62.5 kg.  DVT prophylaxis: Lovenox  code Status: Full code Family Communication:  None Disposition Plan:  Status is: Inpatient   Dispo: The patient is from: Home              Anticipated d/c is to: SNF              Anticipated d/c date is: > 3 days              Patient currently is not medically stable to d/c.  Patient with severe Covid pneumonia severe hypoxia on IV steroids.    Consultants: None  Procedures: None  antimicrobials: None  Subjective: She is sitting up in chair awake and alert eating breakfast feeling better still on 15 L though  Objective: Vitals:   10/16/20 2030 10/17/20 0500 10/17/20 0511 10/17/20 1018  BP: 132/69  (!) 164/77 137/71  Pulse: 81  80   Resp: 20  20   Temp: 98.1 F (36.7 C)  97.7 F (36.5 C)   TempSrc: Oral  Oral   SpO2: 97%  93%   Weight:  62.5 kg    Height:        Intake/Output Summary (Last 24 hours) at 10/17/2020 1243 Last data filed at 10/17/2020 0800 Gross per 24 hour  Intake 1200 ml  Output --  Net 1200 ml   Filed Weights   10/15/20 0500 10/16/20 0500 10/17/20 0500  Weight: 64 kg 62.4 kg 62.5 kg    Examination:  General exam: Appears calm and comfortable  Respiratory system: Bilateral rhonchi to auscultation. Respiratory effort normal. Cardiovascular system: S1 & S2 heard, RRR. No JVD, murmurs, rubs, gallops or clicks. No pedal edema. Gastrointestinal system: Abdomen is nondistended, soft and nontender. No organomegaly or masses felt. Normal bowel sounds heard. Central nervous system: Alert and oriented. No focal neurological  deficits. Extremities: Symmetric 5 x 5 power. Skin: No rashes, lesions or ulcers Psychiatry: Judgement and insight appear normal. Mood & affect appropriate.     Data Reviewed: I have personally reviewed following labs and imaging studies  CBC: Recent Labs  Lab 10/11/20 0424 10/12/20 0408  WBC 16.8* 17.5*  NEUTROABS 14.6* 15.1*  HGB 11.3* 11.1*  HCT 34.2* 33.6*  MCV 86.4 87.0  PLT 496* 324*   Basic Metabolic Panel: Recent Labs  Lab 10/11/20 0424 10/11/20 0424  10/12/20 0408 10/12/20 0408 10/13/20 0429 10/14/20 0400 10/15/20 0355 10/16/20 0359 10/17/20 0350  NA 137  --  136  --   --   --   --   --   --   K 4.4  --  4.5  --   --   --   --   --   --   CL 101  --  99  --   --   --   --   --   --   CO2 27  --  27  --   --   --   --   --   --   GLUCOSE 128*  --  134*  --   --   --   --   --   --   BUN 33*  --  32*  --   --   --   --   --   --   CREATININE 0.84  --  0.78  --   --   --   --   --   --   CALCIUM 9.4  --  9.3  --   --   --   --   --   --   MG 2.6*   < > 2.4   < > 2.5* 2.4 2.6* 2.4 2.3  PHOS 4.1   < > 4.5   < > 4.4 3.6 4.5 3.8 3.8   < > = values in this interval not displayed.   GFR: Estimated Creatinine Clearance: 57.3 mL/min (by C-G formula based on SCr of 0.78 mg/dL). Liver Function Tests: Recent Labs  Lab 10/11/20 0424 10/12/20 0408  AST 17 15  ALT 21 20  ALKPHOS 66 65  BILITOT 0.6 0.8  PROT 6.5 6.3*  ALBUMIN 2.6* 2.7*   No results for input(s): LIPASE, AMYLASE in the last 168 hours. No results for input(s): AMMONIA in the last 168 hours. Coagulation Profile: No results for input(s): INR, PROTIME in the last 168 hours. Cardiac Enzymes: No results for input(s): CKTOTAL, CKMB, CKMBINDEX, TROPONINI in the last 168 hours. BNP (last 3 results) No results for input(s): PROBNP in the last 8760 hours. HbA1C: No results for input(s): HGBA1C in the last 72 hours. CBG: Recent Labs  Lab 10/16/20 1151 10/16/20 1701 10/16/20 2032 10/17/20 0733 10/17/20 1136  GLUCAP 137* 103* 126* 125* 94   Lipid Profile: No results for input(s): CHOL, HDL, LDLCALC, TRIG, CHOLHDL, LDLDIRECT in the last 72 hours. Thyroid Function Tests: No results for input(s): TSH, T4TOTAL, FREET4, T3FREE, THYROIDAB in the last 72 hours. Anemia Panel: Recent Labs    10/16/20 0359 10/17/20 0350  FERRITIN 415* 351*   Sepsis Labs: No results for input(s): PROCALCITON, LATICACIDVEN in the last 168 hours.  No results found for this or any previous  visit (from the past 240 hour(s)).       Radiology Studies: DG Chest 1 View  Result Date: 10/16/2020 CLINICAL DATA:  Shortness of breath.  COVID positive. EXAM: CHEST  1 VIEW COMPARISON:  10/09/2020 and prior radiographs FINDINGS: The cardiomediastinal silhouette is unremarkable. Bilateral airspace opacities are slightly increased. No pleural effusion or pneumothorax. No other significant changes identified. IMPRESSION: Slightly increased bilateral airspace opacities/pneumonia. Electronically Signed   By: Margarette Canada M.D.   On: 10/16/2020 12:06        Scheduled Meds: . amLODipine  2.5 mg Oral Daily  . vitamin C  500 mg Oral Daily  . aspirin EC  81 mg Oral Daily  . bisacodyl  10 mg Oral Daily  . buPROPion  150 mg Oral Daily  . docusate sodium  100 mg Oral BID  . enoxaparin (LOVENOX) injection  40 mg Subcutaneous Q24H  . feeding supplement  237 mL Oral BID BM  . fluticasone furoate-vilanterol  1 puff Inhalation Daily  . insulin aspart  0-9 Units Subcutaneous TID WC  . levothyroxine  50 mcg Oral QAC breakfast  . methylPREDNISolone (SOLU-MEDROL) injection  60 mg Intravenous TID  . multivitamin  15 mL Oral Daily  . polyethylene glycol  17 g Oral Daily  . umeclidinium bromide  1 puff Inhalation Daily  . zinc sulfate  220 mg Oral Daily   Continuous Infusions:   LOS: 17 days     Georgette Shell, MD  10/17/2020, 12:43 PM

## 2020-10-18 DIAGNOSIS — J9601 Acute respiratory failure with hypoxia: Secondary | ICD-10-CM | POA: Diagnosis not present

## 2020-10-18 DIAGNOSIS — U071 COVID-19: Secondary | ICD-10-CM | POA: Diagnosis not present

## 2020-10-18 LAB — FERRITIN: Ferritin: 293 ng/mL (ref 11–307)

## 2020-10-18 LAB — GLUCOSE, CAPILLARY
Glucose-Capillary: 90 mg/dL (ref 70–99)
Glucose-Capillary: 85 mg/dL (ref 70–99)
Glucose-Capillary: 120 mg/dL — ABNORMAL HIGH (ref 70–99)
Glucose-Capillary: 141 mg/dL — ABNORMAL HIGH (ref 70–99)

## 2020-10-18 MED ORDER — LACTULOSE 10 GM/15ML PO SOLN
20.0000 g | Freq: Once | ORAL | Status: AC
  Administered 2020-10-18: 20 g via ORAL
  Filled 2020-10-18: qty 30

## 2020-10-18 MED ORDER — SORBITOL 70 % SOLN
960.0000 mL | TOPICAL_OIL | Freq: Once | ORAL | Status: AC
  Administered 2020-10-18: 960 mL via RECTAL
  Filled 2020-10-18: qty 473

## 2020-10-18 NOTE — Progress Notes (Addendum)
PROGRESS NOTE    Jean Shaffer  JKD:[id number] DOB: [birth year] DOA: 09/30/2020 PCP: [name], MD   Brief Narrative: 69 year old female with history of asthma and COPD, hypertension, hypothyroidism, chronic back pain admitted 12 days ago with Covid pneumonia.  Patient has been making very slow progress since admission.  Assessment & Plan:   Active Problems:   COVID-19   Acute respiratory failure with hypoxia (HCC)  #1 acute hypoxic respiratory failure secondary to Covid pneumonia status post treatment with baricitinib and remdesivir.  She has been here for 17 days. Patient still remains on high flow nasal cannula at 15 L saturating 91%.Continue to encourage patient to get in and around the room unfortunately she cannot walk outside the room. Continue steroids inhalers incentive spirometry and flutter valve zinc and vitamin C. Repeat chest x-ray today shows increasing infiltrates however clinically she has improved CRP 0.6/ferritin 351/LDH 165/magnesium 2.3/phosphorus 3.8/D-dimer 0.53.  I have stopped daily labs since they were stable.  Check labs as needed.   #2 hypothyroidism continue Synthroid  #3 history of COPD continue breathing treatments  #4 abnormal LFTs resolved  #5 leukocytosis follow-up patient is on steroids  #6 history of essential hypertension-blood pressure 137/71.  Continue Norvasc.  #7 history of depression continue Wellbutrin  #8 chronic back pain continue Norco  #9 constipation continue all the stool softeners including Colace Dulcolax and MiraLAX.  She still continues to complain of constipation in spite of enema and suppository.  I will give her a smog enema and lactulose.  Nutrition Problem: Increased nutrient needs Etiology: acute illness (COVID-19 infection)  Signs/Symptoms: estimated needs  Interventions: Ensure Enlive (each supplement provides 350kcal and 20 grams of protein), MVI  Estimated body mass index is 24.07 kg/m as calculated from  the following:   Height as of this encounter: 5\' 4"  (1.626 m).   Weight as of this encounter: 63.6 kg.  DVT prophylaxis: Lovenox  code Status: Full code Family Communication: None Disposition Plan:  Status is: Inpatient   Dispo: The patient is from: Home              Anticipated d/c is to: SNF              Anticipated d/c date is: > 3 days              Patient currently is not medically stable to d/c.  Patient with severe Covid pneumonia severe hypoxia on IV steroids.    Consultants: None  Procedures: None  antimicrobials: None  Subjective: She is sitting up in chair again she reported that she ambulated in the room yesterday not sure how much she is sleeping prone as she cannot sleep in the hospital.  Objective: Vitals:   10/17/20 2003 10/18/20 0327 10/18/20 0500 10/18/20 1054  BP: 138/69 133/65    Pulse: 86 87    Resp: (!) 21 (!) 21    Temp: 98 F (36.7 C) 98 F (36.7 C)    TempSrc: Oral Axillary    SpO2: 95% 96%  94%  Weight:   63.6 kg   Height:        Intake/Output Summary (Last 24 hours) at 10/18/2020 1251 Last data filed at 10/18/2020 1223 Gross per 24 hour  Intake 1317.96 ml  Output 800 ml  Net 517.96 ml   Filed Weights   10/16/20 0500 10/17/20 0500 10/18/20 0500  Weight: 62.4 kg 62.5 kg 63.6 kg    Examination:  General exam: Appears calm and comfortable  Respiratory system: Bilateral rhonchi to auscultation. Respiratory effort normal. Cardiovascular system: S1 & S2 heard, RRR. No JVD, murmurs, rubs, gallops or clicks. No pedal edema. Gastrointestinal system: Abdomen is nondistended, soft and nontender. No organomegaly or masses felt. Normal bowel sounds heard. Central nervous system: Alert and oriented. No focal neurological deficits. Extremities: Symmetric 5 x 5 power. Skin: No rashes, lesions or ulcers Psychiatry: Judgement and insight appear normal. Mood & affect appropriate.     Data Reviewed: I have personally reviewed following labs and  imaging studies  CBC: Recent Labs  Lab 10/12/20 0408  WBC 17.5*  NEUTROABS 15.1*  HGB 11.1*  HCT 33.6*  MCV 87.0  PLT 540*   Basic Metabolic Panel: Recent Labs  Lab 10/12/20 0408 10/12/20 0408 10/13/20 0429 10/14/20 0400 10/15/20 0355 10/16/20 0359 10/17/20 0350  NA 136  --   --   --   --   --   --   K 4.5  --   --   --   --   --   --   CL 99  --   --   --   --   --   --   CO2 27  --   --   --   --   --   --   GLUCOSE 134*  --   --   --   --   --   --   BUN 32*  --   --   --   --   --   --   CREATININE 0.78  --   --   --   --   --   --   CALCIUM 9.3  --   --   --   --   --   --   MG 2.4   < > 2.5* 2.4 2.6* 2.4 2.3  PHOS 4.5   < > 4.4 3.6 4.5 3.8 3.8   < > = values in this interval not displayed.   GFR: Estimated Creatinine Clearance: 57.3 mL/min (by C-G formula based on SCr of 0.78 mg/dL). Liver Function Tests: Recent Labs  Lab 10/12/20 0408  AST 15  ALT 20  ALKPHOS 65  BILITOT 0.8  PROT 6.3*  ALBUMIN 2.7*   No results for input(s): LIPASE, AMYLASE in the last 168 hours. No results for input(s): AMMONIA in the last 168 hours. Coagulation Profile: No results for input(s): INR, PROTIME in the last 168 hours. Cardiac Enzymes: No results for input(s): CKTOTAL, CKMB, CKMBINDEX, TROPONINI in the last 168 hours. BNP (last 3 results) No results for input(s): PROBNP in the last 8760 hours. HbA1C: No results for input(s): HGBA1C in the last 72 hours. CBG: Recent Labs  Lab 10/17/20 1136 10/17/20 1642 10/17/20 2107 10/18/20 0735 10/18/20 1125  GLUCAP 94 111* 157* 90 85   Lipid Profile: No results for input(s): CHOL, HDL, LDLCALC, TRIG, CHOLHDL, LDLDIRECT in the last 72 hours. Thyroid Function Tests: No results for input(s): TSH, T4TOTAL, FREET4, T3FREE, THYROIDAB in the last 72 hours. Anemia Panel: Recent Labs    10/17/20 0350 10/18/20 0439  FERRITIN 351* 293   Sepsis Labs: No results for input(s): PROCALCITON, LATICACIDVEN in the last 168  hours.  No results found for this or any previous visit (from the past 240 hour(s)).       Radiology Studies: No results found.      Scheduled Meds: . amLODipine  2.5 mg Oral Daily  . vitamin C  500 mg Oral  Daily  . aspirin EC  81 mg Oral Daily  . bisacodyl  10 mg Oral Daily  . buPROPion  150 mg Oral Daily  . docusate sodium  100 mg Oral BID  . enoxaparin (LOVENOX) injection  40 mg Subcutaneous Q24H  . feeding supplement  237 mL Oral BID BM  . fluticasone furoate-vilanterol  1 puff Inhalation Daily  . insulin aspart  0-9 Units Subcutaneous TID WC  . levothyroxine  50 mcg Oral QAC breakfast  . methylPREDNISolone (SOLU-MEDROL) injection  60 mg Intravenous TID  . multivitamin  15 mL Oral Daily  . polyethylene glycol  17 g Oral Daily  . umeclidinium bromide  1 puff Inhalation Daily  . zinc sulfate  220 mg Oral Daily   Continuous Infusions:   LOS: 18 days     Georgette Shell, MD  10/18/2020, 12:51 PM

## 2020-10-18 NOTE — Progress Notes (Signed)
Physical Therapy Treatment Patient Details Name: Jean Shaffer MRN: [id number] DOB: [birth year] Today's Date: 10/18/2020    History of Present Illness 69 y.o. female with Past medical history of asthma and COPD, chronic back pain, HTN, hypothyroidism. Patient presented with complaints of cough and shortness of breath.  Patient started with symptoms on September 23, 2020. Admitted to hospital with COVID pneumonia.    PT Comments    Pt is mobilizing fairly well (requiring minimal assistance) but her O2 requirement remains high. O2 93% on 15L HFNC at rest; 80% on HFNC +O2 mask with activity. Pt fatigues easily. Will continue to follow and progress activity as tolerated.    Follow Up Recommendations  LTACH;SNF (depending on progress/O2 requirement)     Equipment Recommendations  None recommended by PT    Recommendations for Other Services       Precautions / Restrictions Precautions Precautions: Fall Precaution Comments: monitor sats. wearing HFNC and O2 mask for activity Restrictions Weight Bearing Restrictions: No    Mobility  Bed Mobility Overal bed mobility: Independent             General bed mobility comments: oob in recliner  Transfers Overall transfer level: Needs assistance Equipment used: None Transfers: Sit to/from Stand Sit to Stand: Min guard Stand pivot transfers: Min guard       General transfer comment: stand pivot from recliner to bsc. min guard for safety, lines.  Ambulation/Gait Ambulation/Gait assistance: Min assist Gait Distance (Feet): 5 Feet Assistive device: None       General Gait Details: only walked a short distance from bsc to bed on today. pt reported feelilng fatigued and wanting to rest after sitting up this morning. O2 80% on HFNC + O2 mask.   Stairs             Wheelchair Mobility    Modified Rankin (Stroke Patients Only)       Balance Overall balance assessment: Needs assistance         Standing  balance support: No upper extremity supported;During functional activity Standing balance-Leahy Scale: Fair                              Cognition Arousal/Alertness: Awake/alert Behavior During Therapy: WFL for tasks assessed/performed Overall Cognitive Status: Within Functional Limits for tasks assessed                                        Exercises      General Comments        Pertinent Vitals/Pain Pain Assessment: No/denies pain    Home Living                      Prior Function            PT Goals (current goals can now be found in the care plan section) Progress towards PT goals: Progressing toward goals    Frequency    Min 3X/week      PT Plan Current plan remains appropriate    Co-evaluation              AM-PAC PT "6 Clicks" Mobility   Outcome Measure  Help needed turning from your back to your side while in a flat bed without using bedrails?: None Help needed moving from lying on your back to sitting on  the side of a flat bed without using bedrails?: None Help needed moving to and from a bed to a chair (including a wheelchair)?: A Little Help needed standing up from a chair using your arms (e.g., wheelchair or bedside chair)?: A Little Help needed to walk in hospital room?: A Little Help needed climbing 3-5 steps with a railing? : A Little 6 Click Score: 20    End of Session Equipment Utilized During Treatment: Oxygen Activity Tolerance: Patient tolerated treatment well Patient left: in bed;with call bell/phone within reach   PT Visit Diagnosis: Difficulty in walking, not elsewhere classified (R26.2);Unsteadiness on feet (R26.81)     Time: 4097-3532 PT Time Calculation (min) (ACUTE ONLY): 8 min  Charges:  $Gait Training: 8-22 mins                         Doreatha Massed, PT Acute Rehabilitation  Office: [phone] Pager: [phone]

## 2020-10-19 DIAGNOSIS — R0602 Shortness of breath: Secondary | ICD-10-CM

## 2020-10-19 DIAGNOSIS — U071 COVID-19: Secondary | ICD-10-CM | POA: Diagnosis not present

## 2020-10-19 DIAGNOSIS — J9601 Acute respiratory failure with hypoxia: Secondary | ICD-10-CM | POA: Diagnosis not present

## 2020-10-19 LAB — GLUCOSE, CAPILLARY
Glucose-Capillary: 74 mg/dL (ref 70–99)
Glucose-Capillary: 92 mg/dL (ref 70–99)
Glucose-Capillary: 108 mg/dL — ABNORMAL HIGH (ref 70–99)
Glucose-Capillary: 111 mg/dL — ABNORMAL HIGH (ref 70–99)

## 2020-10-19 MED ORDER — TRAZODONE HCL 100 MG PO TABS
100.0000 mg | ORAL_TABLET | Freq: Every day | ORAL | Status: DC
  Administered 2020-10-19 – 2020-10-24 (×6): 100 mg via ORAL
  Filled 2020-10-19 (×6): qty 1

## 2020-10-19 NOTE — Progress Notes (Signed)
PROGRESS NOTE    Jean Shaffer  WHQ:[id number] DOB: November 14, 1951 DOA: 09/30/2020 PCP: [name], MD   Brief Narrative: 69 year old female with history of asthma and COPD, hypertension, hypothyroidism, chronic back pain admitted 12 days ago with Covid pneumonia.  Patient has been making very slow progress since admission.  Assessment & Plan:   Active Problems:   COVID-19   Acute respiratory failure with hypoxia (HCC)  #1 acute hypoxic respiratory failure secondary to Covid pneumonia status post treatment with baricitinib and remdesivir.  She has been here for 19 days. Patient still remains on high flow nasal cannula at 15 L saturating 91%.Continue to encourage patient to get in and around the room unfortunately she cannot walk outside the room. Continue steroids inhalers incentive spirometry and flutter valve zinc and vitamin C. Repeat chest x-ray today shows increasing infiltrates however clinically she has improved CRP 0.6/ferritin 351/LDH 165/magnesium 2.3/phosphorus 3.8/D-dimer 0.53.  I have stopped daily labs since they were stable.  Check labs as needed.  I have encouraged her every day to sleep prone.  However she states she just cannot sleep prone especially in hospital bed.  #2 hypothyroidism continue Synthroid  #3 history of COPD continue breathing treatments  #4 abnormal LFTs resolved  #5 leukocytosis follow-up patient is on steroids  #6 history of essential hypertension-pressure is 126/65 on Norvasc.  #7 history of depression continue Wellbutrin  #8 chronic back pain continue Norco  #9 constipation continue all the stool softeners including Colace Dulcolax and MiraLAX.  She still continues to complain of constipation in spite of enema and suppository.  I will give her a smog enema and lactulose.  #10 insomnia she is requesting trazodone which she takes at home which I will restart.  Nutrition Problem: Increased nutrient needs Etiology: acute illness (COVID-19  infection)  Signs/Symptoms: estimated needs  Interventions: Ensure Enlive (each supplement provides 350kcal and 20 grams of protein), MVI  Estimated body mass index is 24.9 kg/m as calculated from the following:   Height as of this encounter: 5\' 4"  (1.626 m).   Weight as of this encounter: 65.8 kg.  DVT prophylaxis: Lovenox  code Status: Full code Family Communication: None Disposition Plan:  Status is: Inpatient   Dispo: The patient is from: Home              Anticipated d/c is to: SNF              Anticipated d/c date is: > 3 days              Patient currently is not medically stable to d/c.  Patient with severe Covid pneumonia severe hypoxia on IV steroids.    Consultants: None  Procedures: None  antimicrobials: None  Subjective: She is sitting up in bed she had multiple bowel movements with the lactulose. Remains on 15 L of oxygen.  Objective: Vitals:   10/18/20 1339 10/18/20 2033 10/19/20 0515 10/19/20 1428  BP: 132/63 (!) 142/70 (!) 147/72 126/65  Pulse: 96 84 94 88  Resp: 20 20 19 18   Temp: (!) 97.4 F (36.3 C) 97.9 F (36.6 C) 97.9 F (36.6 C) 98.1 F (36.7 C)  TempSrc:    Oral  SpO2: 93% 98% 98% 99%  Weight:   65.8 kg   Height:        Intake/Output Summary (Last 24 hours) at 10/19/2020 1607 Last data filed at 10/19/2020 1220 Gross per 24 hour  Intake 1190 ml  Output --  Net 1190 ml  Filed Weights   10/17/20 0500 10/18/20 0500 10/19/20 0515  Weight: 62.5 kg 63.6 kg 65.8 kg    Examination:  General exam: Appears calm and comfortable  Respiratory system: Bilateral rhonchi to auscultation. Respiratory effort normal. Cardiovascular system: S1 & S2 heard, RRR. No JVD, murmurs, rubs, gallops or clicks. No pedal edema. Gastrointestinal system: Abdomen is nondistended, soft and nontender. No organomegaly or masses felt. Normal bowel sounds heard. Central nervous system: Alert and oriented. No focal neurological deficits. Extremities: Symmetric 5 x  5 power. Skin: No rashes, lesions or ulcers Psychiatry: Judgement and insight appear normal. Mood & affect appropriate.     Data Reviewed: I have personally reviewed following labs and imaging studies  CBC: No results for input(s): WBC, NEUTROABS, HGB, HCT, MCV, PLT in the last 168 hours. Basic Metabolic Panel: Recent Labs  Lab 10/13/20 0429 10/14/20 0400 10/15/20 0355 10/16/20 0359 10/17/20 0350  MG 2.5* 2.4 2.6* 2.4 2.3  PHOS 4.4 3.6 4.5 3.8 3.8   GFR: Estimated Creatinine Clearance: 61.9 mL/min (by C-G formula based on SCr of 0.78 mg/dL). Liver Function Tests: No results for input(s): AST, ALT, ALKPHOS, BILITOT, PROT, ALBUMIN in the last 168 hours. No results for input(s): LIPASE, AMYLASE in the last 168 hours. No results for input(s): AMMONIA in the last 168 hours. Coagulation Profile: No results for input(s): INR, PROTIME in the last 168 hours. Cardiac Enzymes: No results for input(s): CKTOTAL, CKMB, CKMBINDEX, TROPONINI in the last 168 hours. BNP (last 3 results) No results for input(s): PROBNP in the last 8760 hours. HbA1C: No results for input(s): HGBA1C in the last 72 hours. CBG: Recent Labs  Lab 10/18/20 1125 10/18/20 1624 10/18/20 2034 10/19/20 0807 10/19/20 1122  GLUCAP 85 120* 141* 74 92   Lipid Profile: No results for input(s): CHOL, HDL, LDLCALC, TRIG, CHOLHDL, LDLDIRECT in the last 72 hours. Thyroid Function Tests: No results for input(s): TSH, T4TOTAL, FREET4, T3FREE, THYROIDAB in the last 72 hours. Anemia Panel: Recent Labs    10/17/20 0350 10/18/20 0439  FERRITIN 351* 293   Sepsis Labs: No results for input(s): PROCALCITON, LATICACIDVEN in the last 168 hours.  No results found for this or any previous visit (from the past 240 hour(s)).       Radiology Studies: No results found.      Scheduled Meds: . amLODipine  2.5 mg Oral Daily  . vitamin C  500 mg Oral Daily  . aspirin EC  81 mg Oral Daily  . bisacodyl  10 mg Oral Daily   . buPROPion  150 mg Oral Daily  . docusate sodium  100 mg Oral BID  . enoxaparin (LOVENOX) injection  40 mg Subcutaneous Q24H  . feeding supplement  237 mL Oral BID BM  . fluticasone furoate-vilanterol  1 puff Inhalation Daily  . insulin aspart  0-9 Units Subcutaneous TID WC  . levothyroxine  50 mcg Oral QAC breakfast  . methylPREDNISolone (SOLU-MEDROL) injection  60 mg Intravenous TID  . multivitamin  15 mL Oral Daily  . polyethylene glycol  17 g Oral Daily  . umeclidinium bromide  1 puff Inhalation Daily  . zinc sulfate  220 mg Oral Daily   Continuous Infusions:   LOS: 19 days     Georgette Shell, MD  10/19/2020, 4:07 PM

## 2020-10-20 DIAGNOSIS — J9601 Acute respiratory failure with hypoxia: Secondary | ICD-10-CM | POA: Diagnosis not present

## 2020-10-20 LAB — GLUCOSE, CAPILLARY
Glucose-Capillary: 70 mg/dL (ref 70–99)
Glucose-Capillary: 96 mg/dL (ref 70–99)
Glucose-Capillary: 111 mg/dL — ABNORMAL HIGH (ref 70–99)
Glucose-Capillary: 100 mg/dL — ABNORMAL HIGH (ref 70–99)

## 2020-10-20 MED ORDER — METHYLPREDNISOLONE SODIUM SUCC 40 MG IJ SOLR
40.0000 mg | Freq: Two times a day (BID) | INTRAMUSCULAR | Status: DC
  Administered 2020-10-20 – 2020-10-21 (×3): 40 mg via INTRAVENOUS
  Filled 2020-10-20 (×3): qty 1

## 2020-10-20 NOTE — Progress Notes (Signed)
Physical Therapy Treatment Patient Details Name: Jean Shaffer MRN: [id number] DOB: 09/28/1951 Today's Date: 10/20/2020    History of Present Illness 69 y.o. female with Past medical history of asthma and COPD, chronic back pain, HTN, hypothyroidism. Patient presented with complaints of cough and shortness of breath.  Patient started with symptoms on September 23, 2020. Admitted to hospital with COVID pneumonia.    PT Comments    Patient making good progress with therapy and is trying to wean down O2 requirements today. She is sating at 88-90% on 7L/min at start of session. Patient no longer on precautions today and able to ambulate increased distance in hallway. Pt ambulated 8L/min with sats maintained at 84-90% and then dropped to low of 72% during standing rest break. Pt unable to recover and O2 increased to 10L and NRB donned with 10 then 15L/min. Sats improved to 93% and pt then ambulated additional 30' with a drop to 84% but recovered to 94% in 1-2 minutes. She was left on 10L/min on HFNC at EOS saturating at 94% and RN notified. She will continue to benefit from skilled PT interventions to progress mobility and activity tolerance. Pt continues to require increased O2 support for mobility.   Follow Up Recommendations  LTACH;SNF (O2 requirements)     Equipment Recommendations  None recommended by PT    Recommendations for Other Services OT consult     Precautions / Restrictions Precautions Precautions: Fall Precaution Comments: monitor sats, on HFNC Restrictions Weight Bearing Restrictions: No    Mobility  Bed Mobility               General bed mobility comments: Pt OOB in recliner  Transfers Overall transfer level: Needs assistance Equipment used: Rolling walker (2 wheeled) Transfers: Sit to/from Stand Sit to Stand: Supervision;Min guard         General transfer comment: no assist required, pt with safe hand placement for RW and safe reach back to sit in  recliner. supervision/guarding for safety.  Ambulation/Gait Ambulation/Gait assistance: Min assist;Min guard Gait Distance (Feet): 120 Feet (90, 30) Assistive device: Rolling walker (2 wheeled) Gait Pattern/deviations: Step-through pattern;Decreased stride length Gait velocity: fair   General Gait Details: pt off precautions and able to ambulate greater distance in hallway today. she required cues for safe gait speed as pt was attempted to increase velocity. Assist required for line/O2 management and chair follow for safety. pt amb ~90' on 8L/min and sats remained 86% or greater. Pt took standing rest break with cues for breathing and sats gradually dropped to low of 72. O2 increased to 10L and NRB donned at 10L. Sat improved to 86-88% and NRB increased to 15L, sats increased to 93%. Pt able to ambulated 30' back to room on 25L total and sat dropped to 84%. pt recovered within 1-2 minutes to 94% and NRB removed. Pt fluctuating between 88-94% on 10L HFNC at EOS.    Stairs             Wheelchair Mobility    Modified Rankin (Stroke Patients Only)       Balance Overall balance assessment: Needs assistance Sitting-balance support: Feet supported Sitting balance-Leahy Scale: Good     Standing balance support: No upper extremity supported;Bilateral upper extremity supported Standing balance-Leahy Scale: Fair Standing balance comment: statically patient able to stand without UE support, utilizes walker with ambulation "sometimes I'm wobbly"  Cognition Arousal/Alertness: Awake/alert Behavior During Therapy: WFL for tasks assessed/performed Overall Cognitive Status: Within Functional Limits for tasks assessed                                        Exercises      General Comments General comments (skin integrity, edema, etc.): see ADL section      Pertinent Vitals/Pain Pain Assessment: No/denies pain    Home Living                       Prior Function            PT Goals (current goals can now be found in the care plan section) Acute Rehab PT Goals Patient Stated Goal: to feel better PT Goal Formulation: With patient Time For Goal Achievement: 10/29/20 (extended by 2 weeks) Potential to Achieve Goals: Good Progress towards PT goals: Progressing toward goals    Frequency    Min 3X/week      PT Plan Current plan remains appropriate    Co-evaluation              AM-PAC PT "6 Clicks" Mobility   Outcome Measure  Help needed turning from your back to your side while in a flat bed without using bedrails?: None Help needed moving from lying on your back to sitting on the side of a flat bed without using bedrails?: None Help needed moving to and from a bed to a chair (including a wheelchair)?: A Little Help needed standing up from a chair using your arms (e.g., wheelchair or bedside chair)?: A Little Help needed to walk in hospital room?: A Little Help needed climbing 3-5 steps with a railing? : A Little 6 Click Score: 20    End of Session Equipment Utilized During Treatment: Oxygen;Gait belt Activity Tolerance: Patient tolerated treatment well Patient left: in chair;with call bell/phone within reach;with chair alarm set Nurse Communication: Mobility status;Other (comment) (pt left on 10L/min HFNC, RN notified of desat with gait) PT Visit Diagnosis: Difficulty in walking, not elsewhere classified (R26.2);Unsteadiness on feet (R26.81)     Time: 7741-2878 PT Time Calculation (min) (ACUTE ONLY): 31 min  Charges:  $Gait Training: 23-37 mins                     Verner Mould, DPT Acute Rehabilitation Services  Office [phone] Pager [phone]  10/20/2020 5:26 PM

## 2020-10-20 NOTE — Progress Notes (Signed)
Occupational Therapy Treatment Patient Details Name: Jean Shaffer MRN: [id number] DOB: 1950-12-30 Today's Date: 10/20/2020    History of present illness 69 y.o. female with Past medical history of asthma and COPD, chronic back pain, HTN, hypothyroidism. Patient presented with complaints of cough and shortness of breath.  Patient started with symptoms on September 23, 2020. Admitted to hospital with COVID pneumonia.   OT comments  Upon arrival patient seated in chair on 15L O2. Patient ambulate approx 73ft with rolling walker at supervision level maintaining high 80s-low 90s. During seated rest break wean patient to 10L and performed ~60ft ambulation in room 2x with seated rest break in between. Patient desat to 80% however demonstrates adequate PLB technique and recovers to 90% within ~30 seconds. At end of session once returned to recliner wean patient to 8L and maintains between 88-92%. Notified RN patient weaned to 8L at end of session and of vitals during functional ambulation.   Follow Up Recommendations  SNF    Equipment Recommendations  Tub/shower seat       Precautions / Restrictions Precautions Precautions: Fall Precaution Comments: monitor sats, on HFNC       Mobility Bed Mobility               General bed mobility comments: in recliner  Transfers Overall transfer level: Needs assistance Equipment used: Rolling walker (2 wheeled) Transfers: Sit to/from Stand Sit to Stand: Supervision         General transfer comment: no physical assistance required, S for safety and line management    Balance Overall balance assessment: Needs assistance Sitting-balance support: Feet supported Sitting balance-Leahy Scale: Good     Standing balance support: No upper extremity supported;Bilateral upper extremity supported Standing balance-Leahy Scale: Fair Standing balance comment: statically patient able to stand without UE support, utilizes walker with ambulation  "sometimes I'm wobbly"                           ADL either performed or assessed with clinical judgement   ADL Overall ADL's : Needs assistance/impaired                         Toilet Transfer: Supervision/safety;Ambulation;RW             General ADL Comments: today's session focusing on maximizing endurance needed for participation in self care and weaning O2 in order to facilitate D/C home, patient is motivated "I want to do as much as I can while you're here" wean patient to 10L with functional ambulation with desat to 80 after ~57ft, recovers within 30 seconds of seated rest. performed this x3               Cognition Arousal/Alertness: Awake/alert Behavior During Therapy: WFL for tasks assessed/performed Overall Cognitive Status: Within Functional Limits for tasks assessed                                                General Comments see ADL section    Pertinent Vitals/ Pain       Pain Assessment: No/denies pain         Frequency  Min 2X/week        Progress Toward Goals  OT Goals(current goals can now be found in the care plan section)  Progress  towards OT goals: Progressing toward goals  Acute Rehab OT Goals Patient Stated Goal: to feel better OT Goal Formulation: With patient Time For Goal Achievement: 10/29/20 Potential to Achieve Goals: Good  Plan Discharge plan remains appropriate       AM-PAC OT "6 Clicks" Daily Activity     Outcome Measure   Help from another person eating meals?: None Help from another person taking care of personal grooming?: A Little Help from another person toileting, which includes using toliet, bedpan, or urinal?: A Little Help from another person bathing (including washing, rinsing, drying)?: A Little Help from another person to put on and taking off regular upper body clothing?: A Little Help from another person to put on and taking off regular lower body clothing?: A  Little 6 Click Score: 19    End of Session Equipment Utilized During Treatment: Oxygen;Rolling walker  OT Visit Diagnosis: Muscle weakness (generalized) (M62.81)   Activity Tolerance Patient tolerated treatment well   Patient Left in chair;with call bell/phone within reach   Nurse Communication Mobility status;Other (comment) (weaned to 8L)        Time: 1218-1241 OT Time Calculation (min): 23 min  Charges: OT General Charges $OT Visit: 1 Visit OT Treatments $Self Care/Home Management : 23-37 mins  Delbert Phenix OT OT pager: [phone]   Rosemary Holms 10/20/2020, 2:47 PM

## 2020-10-20 NOTE — Progress Notes (Signed)
Nutrition Follow-up  INTERVENTION:   -Ensure Enlive po BID, each supplement provides 350 kcal and 20 grams of protein (may be replaced with Ensure Plus providing 350 kcals and 13g protein) -Liquid MVI daily  NUTRITION DIAGNOSIS:   Increased nutrient needs related to acute illness (COVID-19 infection) as evidenced by estimated needs.  Ongoing.  GOAL:   Patient will meet greater than or equal to 90% of their needs  Progressing.  MONITOR:   PO intake, Supplement acceptance, Labs, Weight trends, I & O's  ASSESSMENT:   69 y.o. female with Past medical history of asthma and COPD, chronic back pain, HTN, hypothyroidism.Patient presented with complaints of cough and shortness of breath.  Patient started with symptoms on September 23, 2020.  Tested at [facility].  Presented today for monoclonal antibody infusion but was found hypoxic and therefore was transferred to ER for further work-up. Admitted for COVID-19 viral pneumonia.  COVID-19+ 9/30. On 8L oxygen  Patient currently consuming 50-100% of meals at this time. Pt is drinking Ensure supplements.  Admission weight: 143 lbs. Current weight: 138 lbs.  Medications: Vitamin C, Dulcolax, Colace, Liquid MVI, Miralax, Zinc sulfate  Labs reviewed: CBGs: 70-96  Diet Order:   Diet Order            Diet regular Room service appropriate? Yes; Fluid consistency: Thin  Diet effective now                 EDUCATION NEEDS:   No education needs have been identified at this time  Skin:  Skin Assessment: Reviewed RN Assessment  Last BM:  10/19  Height:   Ht Readings from Last 1 Encounters:  09/30/20 5\' 4"  (1.626 m)    Weight:   Wt Readings from Last 1 Encounters:  10/20/20 62.9 kg    BMI:  Body mass index is 23.8 kg/m.  Estimated Nutritional Needs:   Kcal:  1800-2000  Protein:  85-100g  Fluid:  2L/day  Clayton Bibles, MS, RD, LDN Inpatient Clinical Dietitian Contact information available via Amion

## 2020-10-20 NOTE — Progress Notes (Signed)
PROGRESS NOTE  Jean Shaffer  CBJ:[id number] DOB: 04-09-1951 DOA: 09/30/2020 PCP: [name], MD   Brief Narrative: Jean Shaffer is a 69 y.o. female with a history of asthma, COPD, HTN, hypothyroidism, and chronic back pain who was admitted 10/7 for covid-19 pneumonia after being found to be hypoxic at the infusion center prior to receiving monoclonal antibody. She has received remdesivir, baricitinib, and steroids with stabilization though she remains dependent on high flow supplemental oxygen.  Assessment & Plan: Active Problems:   COVID-19   Acute respiratory failure with hypoxia (HCC)   Shortness of breath  Acute hypoxemic respiratory failure due to covid-19 pneumonia:  - Wean oxygen as tolerated - SARS-CoV-2 testing positive before 10/5. Can DC precautions.  - s/p remdesivir, baricitinib. Will begin tapering steroids.  - Encourage OOB, IS, FV - Enoxaparin prophylactic dose.   COPD: Stable.  - Continue prn BDs  LFT elevation: Resolved  HTN:  - Continue norvasc  Depression: Stable.  - Continue bupropion  Chronic back pain:  - Continue hydrocodone and frequent mobility  Hypothyroidism:  - Continue synthroid.   Insomnia:  - Taper steroids - Continue trazodone  DVT prophylaxis: Lovenox Code Status: Full Family Communication: None at bedside. Visitor restrictions may be lifted.  Disposition Plan: Home once weaned from supplemental oxygen. Status is: Inpatient  Remains inpatient appropriate because:Inpatient level of care appropriate due to severity of illness  Dispo: The patient is from: Home              Anticipated d/c is to: Home              Anticipated d/c date is: > 3 days              Patient currently is not medically stable to d/c.  Consultants:   None  Procedures:   None  Antimicrobials:  Remdesivir   Subjective: Feels stable, was able to get up with PT and ambulate several times with moderate shortness of breath and  desaturations. No chest pain, leg swelling.   Objective: Vitals:   10/19/20 1950 10/20/20 0406 10/20/20 0500 10/20/20 1348  BP: (!) 157/81 (!) 114/47  131/71  Pulse: 75 90  85  Resp: (!) 21 (!) 21  18  Temp: 98.6 F (37 C) 98.1 F (36.7 C)  98.4 F (36.9 C)  TempSrc: Oral Other (Comment)  Oral  SpO2: 97% 98%  96%  Weight:   62.9 kg   Height:        Intake/Output Summary (Last 24 hours) at 10/20/2020 1813 Last data filed at 10/20/2020 0933 Gross per 24 hour  Intake 480 ml  Output --  Net 480 ml   Filed Weights   10/18/20 0500 10/19/20 0515 10/20/20 0500  Weight: 63.6 kg 65.8 kg 62.9 kg    Gen: 69 y.o. female in no distress Pulm: Non-labored breathing HFNC supplementation, tachypneic. Crackles bilaterally.  CV: Regular rate and rhythm. No murmur, rub, or gallop. No JVD, no pedal edema. GI: Abdomen soft, non-tender, non-distended, with normoactive bowel sounds. No organomegaly or masses felt. Ext: Warm, no deformities Skin: No rashes, lesions or ulcers Neuro: Alert and oriented. No focal neurological deficits. Psych: Judgement and insight appear normal. Mood & affect appropriate.   CBG: Recent Labs  Lab 10/19/20 1650 10/19/20 2057 10/20/20 0738 10/20/20 1148 10/20/20 1652  GLUCAP 108* 111* 70 96 111*   Scheduled Meds: . amLODipine  2.5 mg Oral Daily  . vitamin C  500 mg Oral Daily  .  aspirin EC  81 mg Oral Daily  . bisacodyl  10 mg Oral Daily  . buPROPion  150 mg Oral Daily  . docusate sodium  100 mg Oral BID  . enoxaparin (LOVENOX) injection  40 mg Subcutaneous Q24H  . feeding supplement  237 mL Oral BID BM  . fluticasone furoate-vilanterol  1 puff Inhalation Daily  . insulin aspart  0-9 Units Subcutaneous TID WC  . levothyroxine  50 mcg Oral QAC breakfast  . methylPREDNISolone (SOLU-MEDROL) injection  40 mg Intravenous Q12H  . multivitamin  15 mL Oral Daily  . polyethylene glycol  17 g Oral Daily  . traZODone  100 mg Oral QHS  . umeclidinium bromide  1  puff Inhalation Daily  . zinc sulfate  220 mg Oral Daily   Continuous Infusions:   LOS: 20 days   Time spent: 35 minutes.  Patrecia Pour, MD [facility] [email or web address] 10/20/2020, 6:13 PM

## 2020-10-21 LAB — GLUCOSE, CAPILLARY
Glucose-Capillary: 89 mg/dL (ref 70–99)
Glucose-Capillary: 78 mg/dL (ref 70–99)
Glucose-Capillary: 131 mg/dL — ABNORMAL HIGH (ref 70–99)
Glucose-Capillary: 70 mg/dL (ref 70–99)

## 2020-10-21 MED ORDER — CALCIUM CARBONATE ANTACID 500 MG PO CHEW
1.0000 | CHEWABLE_TABLET | Freq: Two times a day (BID) | ORAL | Status: DC | PRN
  Administered 2020-10-21: 200 mg via ORAL
  Filled 2020-10-21: qty 1

## 2020-10-21 MED ORDER — PANTOPRAZOLE SODIUM 40 MG PO TBEC
40.0000 mg | DELAYED_RELEASE_TABLET | Freq: Every day | ORAL | Status: DC
  Administered 2020-10-21 – 2020-10-25 (×5): 40 mg via ORAL
  Filled 2020-10-21 (×5): qty 1

## 2020-10-21 MED ORDER — MAGNESIUM HYDROXIDE 400 MG/5ML PO SUSP
15.0000 mL | Freq: Every day | ORAL | Status: DC | PRN
  Administered 2020-10-21: 15 mL via ORAL
  Filled 2020-10-21: qty 30

## 2020-10-21 MED ORDER — SENNA 8.6 MG PO TABS
1.0000 | ORAL_TABLET | Freq: Every day | ORAL | Status: DC
  Administered 2020-10-21 – 2020-10-25 (×5): 8.6 mg via ORAL
  Filled 2020-10-21 (×4): qty 1

## 2020-10-21 MED ORDER — PREDNISONE 20 MG PO TABS
40.0000 mg | ORAL_TABLET | Freq: Every day | ORAL | Status: DC
  Administered 2020-10-22 – 2020-10-25 (×4): 40 mg via ORAL
  Filled 2020-10-21 (×4): qty 2

## 2020-10-21 NOTE — Progress Notes (Addendum)
Inpatient Rehab Admissions Coordinator:   Per PT request, pt was reassessed for CIR candidacy.  Note decrease in O2 requirements to <10L with mobility.  Feel pt could be evaluated by CIR this time and will place a consult order per our protocol.   Shann Medal, PT, DPT Admissions Coordinator [phone] 10/21/20  3:13 PM

## 2020-10-21 NOTE — Progress Notes (Signed)
PROGRESS NOTE  Jean Shaffer  PPI:[id number] DOB: 05/03/1951 DOA: 09/30/2020 PCP: [name], MD   Brief Narrative: Jean Shaffer is a 69 y.o. female with a history of asthma, COPD, HTN, hypothyroidism, and chronic back pain who was admitted 10/7 for covid-19 pneumonia after being found to be hypoxic at the infusion center prior to receiving monoclonal antibody. She has received remdesivir, baricitinib, and steroids with stabilization though she remains dependent on high flow supplemental oxygen.  Assessment & Plan: Active Problems:   COVID-19   Acute respiratory failure with hypoxia (HCC)   Shortness of breath  Acute hypoxemic respiratory failure due to covid-19 pneumonia: No longer requires isolation.  - Wean oxygen as tolerated, down to 8L. CIR now consulted to consider admission. - s/p remdesivir, baricitinib. Tapering steroids. - Encourage OOB, IS, FV - Enoxaparin prophylactic dose.  - Encouraged to get vaccinated.  COPD: Stable.  - Continue prn BDs  LFT elevation: Resolved  HTN:  - Continue norvasc  Depression: Stable.  - Continue bupropion  Chronic back pain:  - Continue hydrocodone and frequent mobility  Constipation:  - Continue scheduled bowel regimen and add milk of magnesia which helped in the past.   GERD:  - Start PPI - TUMS prn  Hypothyroidism:  - Continue synthroid.   Insomnia:  - Seems to be improving with tapering steroids - Continue trazodone  DVT prophylaxis: Lovenox Code Status: Full Family Communication: None at bedside. Visitor restrictions may be lifted.  Disposition Plan:  Status is: Inpatient  Remains inpatient appropriate because:Inpatient level of care appropriate due to severity of illness. Requires continued improvement in hypoxia.  Dispo: The patient is from: Home              Anticipated d/c is to: Undetermined. CIR consult pending.               Anticipated d/c date is: > 3 days              Patient currently is not  medically stable to d/c.  Consultants:   None  Procedures:   None  Antimicrobials:  Remdesivir   Subjective: Breathing overall improved, no chest pain. Having some acid reflux symptoms and constipation again.   Objective: Vitals:   10/20/20 1348 10/20/20 2050 10/21/20 0448 10/21/20 0800  BP: 131/71 (!) 142/67 134/66   Pulse: 85 78 75   Resp: 18 18 18    Temp: 98.4 F (36.9 C) 98.1 F (36.7 C) 98.4 F (36.9 C)   TempSrc: Oral Oral    SpO2: 96% 99% 96% 92%  Weight:   63.1 kg   Height:        Intake/Output Summary (Last 24 hours) at 10/21/2020 1542 Last data filed at 10/20/2020 1745 Gross per 24 hour  Intake 240 ml  Output --  Net 240 ml   Filed Weights   10/19/20 0515 10/20/20 0500 10/21/20 0448  Weight: 65.8 kg 62.9 kg 63.1 kg   Gen: 69 y.o. female in no distress Pulm: Nonlabored breathing 8L, slight crackles at bases with deep inspiration. CV: Regular rate and rhythm. No murmur, rub, or gallop. No JVD, no dependent edema. GI: Abdomen soft, non-tender, non-distended, with normoactive bowel sounds.  Ext: Warm, no deformities Skin: No rashes, lesions or ulcers on visualized skin. Neuro: Alert and oriented. No focal neurological deficits. Psych: Judgement and insight appear fair. Mood euthymic & affect congruent. Behavior is appropriate.    CBG: Recent Labs  Lab 10/20/20 1148 10/20/20 1652 10/20/20  2126 10/21/20 0736 10/21/20 1156  GLUCAP 96 111* 100* 89 78   Scheduled Meds: . amLODipine  2.5 mg Oral Daily  . vitamin C  500 mg Oral Daily  . aspirin EC  81 mg Oral Daily  . bisacodyl  10 mg Oral Daily  . buPROPion  150 mg Oral Daily  . docusate sodium  100 mg Oral BID  . enoxaparin (LOVENOX) injection  40 mg Subcutaneous Q24H  . feeding supplement  237 mL Oral BID BM  . fluticasone furoate-vilanterol  1 puff Inhalation Daily  . insulin aspart  0-9 Units Subcutaneous TID WC  . levothyroxine  50 mcg Oral QAC breakfast  . methylPREDNISolone  (SOLU-MEDROL) injection  40 mg Intravenous Q12H  . multivitamin  15 mL Oral Daily  . polyethylene glycol  17 g Oral Daily  . traZODone  100 mg Oral QHS  . umeclidinium bromide  1 puff Inhalation Daily  . zinc sulfate  220 mg Oral Daily   Continuous Infusions:   LOS: 21 days   Time spent: 25 minutes.  Patrecia Pour, MD [facility] [email or web address] 10/21/2020, 3:42 PM

## 2020-10-21 NOTE — TOC Progression Note (Signed)
Transition of Care [facility]) - Progression Note    Patient Details  Name: Jean Shaffer MRN: [id number] Date of Birth: 12-17-1951  Transition of Care [facility]) CM/SW [place], [place] Phone Number: 10/21/2020, 2:21 PM  Clinical Narrative:  Spoke to Danae Chen at [facility] who states patient is appropriate for transfer, but they have no beds.  Earliest transfer would be over the weekend.  She will let me know if there are any openings.  I called [facility] to ask them to take a look.  Awaiting call back. TOC will continue to follow during the course of hospitalization.      Expected Discharge Plan: [place] Barriers to Discharge: No Barriers Identified  Expected Discharge Plan and Services Expected Discharge Plan: [place]   Discharge Planning Services: CM Consult Post Acute Care Choice: [place] arrangements for the past 2 months: Single Family Home                                       Social Determinants of Health (SDOH) Interventions    Readmission Risk Interventions No flowsheet data found.

## 2020-10-21 NOTE — Progress Notes (Signed)
Physical Therapy Treatment Patient Details Name: Jean Shaffer MRN: [id number] DOB: [birth year] Today's Date: 10/21/2020    History of Present Illness 69 y.o. female with Past medical history of asthma and COPD, chronic back pain, HTN, hypothyroidism. Patient presented with complaints of cough and shortness of breath.  Patient started with symptoms on September 23, 2020. Admitted to hospital with COVID pneumonia.    PT Comments    Patient very eager to ambulate today. Pulse ox on left finger.Patient  Resting on 8 L HFNC at 95%. Patient ambulated x 85' on 8 L with SPO2 drop to 78 %. Placed on 10 l to recover to 85% in 1.5 minutes.. Patient ambulated x 45' on 8 L withSPO2 drop to 81%. Patient recovered back to 94 %. Placed sensor on ear with 98% reading on 8 L at rest. Ambulated x 60' on 8 L with SPO2 drop to 81 %, return to 85% within 1 minute. Ambulated 40' again with SPo2  Reading 81% on ear sensor and on 8 L HFNC. After rest, returned to 965 on 8 l( ear reading, finger reading 94%. Patient is making excellent progress. Highly recommend [ost acute rehab ASAP. Patient tolerated ambulating x 4 within a 30 min. perioe with recovery quickly.    Follow Up Recommendations  CIR     Equipment Recommendations  None recommended by PT    Recommendations for Other Services       Precautions / Restrictions Precautions Precautions: Fall Precaution Comments: monitor sats, on HFNC    Mobility  Bed Mobility               General bed mobility comments: Pt OOB in recliner  Transfers Overall transfer level: Needs assistance Equipment used: Rolling walker (2 wheeled) Transfers: Sit to/from Stand Sit to Stand: Supervision;Min guard         General transfer comment: no assist from recloiner. extra effort from low chair without armrests.  Ambulation/Gait Ambulation/Gait assistance: Min guard Gait Distance (Feet): 85 Feet (seated rest, then 45') Assistive device: Rolling walker (2  wheeled) Gait Pattern/deviations: Step-through pattern;Decreased stride length Gait velocity: decreased   General Gait Details: ambulated on 8 L , desats to 76, placed on 10 l to recover back to 90%. Ambulated again 60' x 2 on 8 l   Stairs             Wheelchair Mobility    Modified Rankin (Stroke Patients Only)       Balance     Sitting balance-Leahy Scale: Good       Standing balance-Leahy Scale: Fair                              Cognition   Behavior During Therapy: WFL for tasks assessed/performed                                          Exercises      General Comments        Pertinent Vitals/Pain Pain Assessment: No/denies pain    Home Living                      Prior Function            PT Goals (current goals can now be found in the care plan section) Progress towards PT goals:  Progressing toward goals    Frequency    Min 3X/week      PT Plan Current plan remains appropriate    Co-evaluation              AM-PAC PT "6 Clicks" Mobility   Outcome Measure  Help needed turning from your back to your side while in a flat bed without using bedrails?: None Help needed moving from lying on your back to sitting on the side of a flat bed without using bedrails?: None Help needed moving to and from a bed to a chair (including a wheelchair)?: A Little Help needed standing up from a chair using your arms (e.g., wheelchair or bedside chair)?: A Little Help needed to walk in hospital room?: A Little Help needed climbing 3-5 steps with a railing? : A Lot 6 Click Score: 19    End of Session Equipment Utilized During Treatment: Oxygen Activity Tolerance: Patient tolerated treatment well Patient left: in chair;with call bell/phone within reach Nurse Communication: Mobility status;Other (comment) PT Visit Diagnosis: Difficulty in walking, not elsewhere classified (R26.2);Unsteadiness on feet (R26.81)      Time: 0221-7981 PT Time Calculation (min) (ACUTE ONLY): 28 min  Charges:  $Gait Training: 23-37 mins                     [place] Pager [phone] Office [phone]    Claretha Cooper 10/21/2020, 2:56 PM

## 2020-10-22 LAB — BASIC METABOLIC PANEL
Anion gap: 7 (ref 5–15)
BUN: 21 mg/dL (ref 8–23)
CO2: 29 mmol/L (ref 22–32)
Calcium: 8.4 mg/dL — ABNORMAL LOW (ref 8.9–10.3)
Chloride: 102 mmol/L (ref 98–111)
Creatinine, Ser: 0.94 mg/dL (ref 0.44–1.00)
GFR, Estimated: >60 mL/min (ref >60)
Glucose, Bld: 79 mg/dL (ref 70–99)
Potassium: 4.4 mmol/L (ref 3.5–5.1)
Sodium: 138 mmol/L (ref 135–145)

## 2020-10-22 LAB — CBC WITH DIFFERENTIAL/PLATELET
Abs Immature Granulocytes: 0.34 10*3/uL — ABNORMAL HIGH (ref 0.00–0.07)
Basophils Absolute: 0 10*3/uL (ref 0.0–0.1)
Basophils Relative: 0 %
Eosinophils Absolute: 0.1 10*3/uL (ref 0.0–0.5)
Eosinophils Relative: 1 %
HCT: 31.1 % — ABNORMAL LOW (ref 36.0–46.0)
Hemoglobin: 10 g/dL — ABNORMAL LOW (ref 12.0–15.0)
Immature Granulocytes: 2 %
Lymphocytes Relative: 16 %
Lymphs Abs: 2.5 10*3/uL (ref 0.7–4.0)
MCH: 28.7 pg (ref 26.0–34.0)
MCHC: 32.2 g/dL (ref 30.0–36.0)
MCV: 89.4 fL (ref 80.0–100.0)
Monocytes Absolute: 1.2 10*3/uL — ABNORMAL HIGH (ref 0.1–1.0)
Monocytes Relative: 8 %
Neutro Abs: 11.7 10*3/uL — ABNORMAL HIGH (ref 1.7–7.7)
Neutrophils Relative %: 73 %
Platelets: 232 10*3/uL (ref 150–400)
RBC: 3.48 MIL/uL — ABNORMAL LOW (ref 3.87–5.11)
RDW: 14 % (ref 11.5–15.5)
WBC: 15.9 10*3/uL — ABNORMAL HIGH (ref 4.0–10.5)
nRBC: 0 % (ref 0.0–0.2)

## 2020-10-22 LAB — GLUCOSE, CAPILLARY
Glucose-Capillary: 74 mg/dL (ref 70–99)
Glucose-Capillary: 84 mg/dL (ref 70–99)
Glucose-Capillary: 149 mg/dL — ABNORMAL HIGH (ref 70–99)
Glucose-Capillary: 190 mg/dL — ABNORMAL HIGH (ref 70–99)

## 2020-10-22 NOTE — Progress Notes (Signed)
PROGRESS NOTE  Jean Shaffer  RSW:[id number] DOB: 08/16/1951 DOA: 09/30/2020 PCP: [name], MD   Brief Narrative: Jean Shaffer is a 69 y.o. female with a history of asthma, COPD, HTN, hypothyroidism, and chronic back pain who was admitted 10/7 for covid-19 pneumonia after being found to be hypoxic at the infusion center prior to receiving monoclonal antibody. She has received remdesivir, baricitinib, and steroids with stabilization though she remains dependent on high flow supplemental oxygen.  10/22/2020: Patient seen.  No new changes.  Patient is awaiting disposition.  Plan is for patient to be discharged.  CIR if possible, if not, LTAC would be considered.  Assessment & Plan: Active Problems:   COVID-19   Acute respiratory failure with hypoxia (HCC)   Shortness of breath  Acute hypoxemic respiratory failure due to covid-19 pneumonia: No longer requires isolation.  - Wean oxygen as tolerated, down to 8L. CIR now consulted to consider admission. - s/p remdesivir, baricitinib. Tapering steroids. - Encourage OOB, IS, FV - Enoxaparin prophylactic dose.  - Encouraged to get vaccinated. 10/22/2020: Patient remained stable.  Continue to manage expectantly.  Patient is currently on 5 L of supplemental oxygen.  COPD: Stable.  - Continue prn BDs  LFT elevation: Resolved  HTN:  - Continue norvasc  Depression: Stable.  - Continue bupropion  Chronic back pain:  - Continue hydrocodone and frequent mobility  Constipation:  - Continue scheduled bowel regimen and add milk of magnesia which helped in the past.   GERD:  - Start PPI - TUMS prn  Hypothyroidism:  - Continue synthroid.   Insomnia:  - Seems to be improving with tapering steroids - Continue trazodone  DVT prophylaxis: Lovenox Code Status: Full Family Communication: None at bedside. Visitor restrictions may be lifted.  Disposition Plan:  Status is: Inpatient  Remains inpatient appropriate  because:Inpatient level of care appropriate due to severity of illness. Requires continued improvement in hypoxia.  Dispo: The patient is from: Home              Anticipated d/c is to: Undetermined. CIR consult pending.               Anticipated d/c date is: > 3 days              Patient currently is not medically stable to d/c.  Consultants:   None  Procedures:   None  Antimicrobials:  Remdesivir   Subjective: Shortness of breath has improved. Patient is stable.  Objective: Vitals:   10/22/20 0405 10/22/20 0500 10/22/20 1038 10/22/20 1405  BP: 130/61  (!) 121/57 (!) 117/54  Pulse: 90  96 (!) 106  Resp: 18   20  Temp: 97.9 F (36.6 C)   98.2 F (36.8 C)  TempSrc: Oral     SpO2: 94%  94% 92%  Weight:  63.9 kg    Height:        Intake/Output Summary (Last 24 hours) at 10/22/2020 1637 Last data filed at 10/22/2020 0405 Gross per 24 hour  Intake 360 ml  Output --  Net 360 ml   Filed Weights   10/20/20 0500 10/21/20 0448 10/22/20 0500  Weight: 62.9 kg 63.1 kg 63.9 kg   Gen: 69 y.o. female in no distress Pulm: Nonlabored breathing 8L, slight crackles at bases with deep inspiration. CV: Regular rate and rhythm. No murmur, rub, or gallop. No JVD, no dependent edema. GI: Abdomen soft, non-tender, non-distended, with normoactive bowel sounds.  Ext: Warm, no deformities Skin: No  rashes, lesions or ulcers on visualized skin. Neuro: Alert and oriented. No focal neurological deficits. Psych: Judgement and insight appear fair. Mood euthymic & affect congruent. Behavior is appropriate.    CBG: Recent Labs  Lab 10/21/20 1156 10/21/20 1709 10/21/20 2044 10/22/20 0743 10/22/20 1145  GLUCAP 78 131* 70 74 84   Scheduled Meds: . amLODipine  2.5 mg Oral Daily  . vitamin C  500 mg Oral Daily  . aspirin EC  81 mg Oral Daily  . bisacodyl  10 mg Oral Daily  . buPROPion  150 mg Oral Daily  . docusate sodium  100 mg Oral BID  . enoxaparin (LOVENOX) injection  40 mg  Subcutaneous Q24H  . feeding supplement  237 mL Oral BID BM  . fluticasone furoate-vilanterol  1 puff Inhalation Daily  . insulin aspart  0-9 Units Subcutaneous TID WC  . levothyroxine  50 mcg Oral QAC breakfast  . multivitamin  15 mL Oral Daily  . pantoprazole  40 mg Oral Daily  . polyethylene glycol  17 g Oral Daily  . predniSONE  40 mg Oral Q breakfast  . senna  1 tablet Oral Daily  . traZODone  100 mg Oral QHS  . umeclidinium bromide  1 puff Inhalation Daily  . zinc sulfate  220 mg Oral Daily   Continuous Infusions:   LOS: 22 days   Time spent: 25 minutes.  Bonnell Public, MD [facility] [email or web address] 10/22/2020, 4:37 PM

## 2020-10-22 NOTE — Progress Notes (Signed)
Inpatient Rehab Admissions:  Inpatient Rehab Consult received.  I spoke with patient over the phone for rehabilitation assessment and to discuss goals and expectations of an inpatient rehab admission.  She is very motivated and ready to transition to inpatient rehab.  We discussed her O2 levels; ideally would like to see her maintaining above 80% on less than 10L and she is working towards that goal.  She lives with her son, daughter in law, and 2 grand children.  Would need to be intermittent supervision at the most.  I will follow up with her on Monday to see how she's doing.   Signed: Shann Medal, PT, DPT Admissions Coordinator [phone] 10/22/20  1:50 PM

## 2020-10-22 NOTE — Progress Notes (Signed)
Patient walked 175 FT (1 1/4 lap around unit) w/ front wheel walker. Her O2 sats dropped to 78% with 10L of HFNC, but she recovered quickly and is now sitting in her chair sating at 90%.

## 2020-10-23 LAB — GLUCOSE, CAPILLARY
Glucose-Capillary: 76 mg/dL (ref 70–99)
Glucose-Capillary: 87 mg/dL (ref 70–99)
Glucose-Capillary: 237 mg/dL — ABNORMAL HIGH (ref 70–99)
Glucose-Capillary: 122 mg/dL — ABNORMAL HIGH (ref 70–99)

## 2020-10-23 NOTE — Progress Notes (Signed)
PROGRESS NOTE  Jean Shaffer  ZOX:[id number] DOB: 1951/04/23 DOA: 09/30/2020 PCP: [name], MD   Brief Narrative: Jean Shaffer is a 69 y.o. female with a history of asthma, COPD, HTN, hypothyroidism, and chronic back pain who was admitted 10/7 for covid-19 pneumonia after being found to be hypoxic at the infusion center prior to receiving monoclonal antibody. She has received remdesivir, baricitinib, and steroids with stabilization though she remains dependent on high flow supplemental oxygen.  10/22/2020: Patient seen.  No new changes.  Patient is awaiting disposition.  Plan is for patient to be discharged.  CIR if possible, if not, LTAC would be considered.  10/23/2020: Patient seen.  No new complaints. Awaiting possible CIR.  Assessment & Plan: Active Problems:   COVID-19   Acute respiratory failure with hypoxia (HCC)   Shortness of breath  Acute hypoxemic respiratory failure due to covid-19 pneumonia: No longer requires isolation.  - Wean oxygen as tolerated, down to 8L. CIR now consulted to consider admission. - s/p remdesivir, baricitinib. Tapering steroids. - Encourage OOB, IS, FV - Enoxaparin prophylactic dose.  - Encouraged to get vaccinated. 10/23/2020: Patient remained stable.  Continue to manage expectantly.  Patient is currently on 4.5 L of supplemental oxygen.  COPD: Stable.  - Continue prn BDs  LFT elevation: Resolved  HTN:  - Controlled.  Depression: Stable.  - Continue bupropion  Chronic back pain:  - Continue hydrocodone and frequent mobility -Controlled  Constipation:  - Continue scheduled bowel regimen and add milk of magnesia which helped in the past.   GERD:  - Start PPI - TUMS prn  Hypothyroidism:  - Continue synthroid.   Insomnia:  - Seems to be improving with tapering steroids - Continue trazodone  DVT prophylaxis: Lovenox Code Status: Full Family Communication: None at bedside. Visitor restrictions may be lifted.    Disposition Plan:  Status is: Inpatient  Remains inpatient appropriate because:Inpatient level of care appropriate due to severity of illness. Requires continued improvement in hypoxia.  Dispo: The patient is from: Home              Anticipated d/c is to: Undetermined. CIR consult pending.               Anticipated d/c date is: > 3 days              Patient currently is not medically stable to d/c.  Consultants:   None  Procedures:   None  Antimicrobials:  Remdesivir   Subjective: No new compalints. Patient is stable.  Objective: Vitals:   10/22/20 2356 10/23/20 0355 10/23/20 0800 10/23/20 1408  BP: (!) 109/56 (!) 148/87 (!) 122/57 (!) 102/54  Pulse: 92 84 88 92  Resp: (!) 21 17 19 18   Temp: 98.2 F (36.8 C) 98.5 F (36.9 C) 98.1 F (36.7 C) 98.7 F (37.1 C)  TempSrc: Oral Oral Oral Oral  SpO2: 98% 98% 94% 97%  Weight:      Height:       No intake or output data in the 24 hours ending 10/23/20 1639 Filed Weights   10/20/20 0500 10/21/20 0448 10/22/20 0500  Weight: 62.9 kg 63.1 kg 63.9 kg   General: Not in any distress. Pulm: Clear to auscultation. CV: S1S2. GI: Abdomen soft, non-tender, non-distended, with normoactive bowel sounds.  Ext: No leg edema Neuro: Alert and oriented.  Patient moves all extremities.   CBG: Recent Labs  Lab 10/22/20 1145 10/22/20 1638 10/22/20 2002 10/23/20 0831 10/23/20 1134  GLUCAP 84 149* 190* 76 87   Scheduled Meds: . amLODipine  2.5 mg Oral Daily  . vitamin C  500 mg Oral Daily  . aspirin EC  81 mg Oral Daily  . bisacodyl  10 mg Oral Daily  . buPROPion  150 mg Oral Daily  . docusate sodium  100 mg Oral BID  . enoxaparin (LOVENOX) injection  40 mg Subcutaneous Q24H  . feeding supplement  237 mL Oral BID BM  . fluticasone furoate-vilanterol  1 puff Inhalation Daily  . insulin aspart  0-9 Units Subcutaneous TID WC  . levothyroxine  50 mcg Oral QAC breakfast  . multivitamin  15 mL Oral Daily  . pantoprazole  40  mg Oral Daily  . polyethylene glycol  17 g Oral Daily  . predniSONE  40 mg Oral Q breakfast  . senna  1 tablet Oral Daily  . traZODone  100 mg Oral QHS  . umeclidinium bromide  1 puff Inhalation Daily  . zinc sulfate  220 mg Oral Daily   Continuous Infusions:   LOS: 23 days   Time spent: 25 minutes.  Bonnell Public, MD [facility] [email or web address] 10/23/2020, 4:39 PM

## 2020-10-23 NOTE — Progress Notes (Signed)
Physical Therapy Treatment Patient Details Name: Jean Shaffer MRN: [id number] DOB: 11/17/1951 Today's Date: 10/23/2020    History of Present Illness 69 y.o. female with Past medical history of asthma and COPD, chronic back pain, HTN, hypothyroidism. Patient presented with complaints of cough and shortness of breath.  Patient started with symptoms on September 23, 2020. Admitted to hospital with COVID pneumonia.    PT Comments    Patient resting  In reclner on5 L HFNC at 96%. Patient ambulated x 85' on 6 L with SPO2 gradually dropping to 81%. Placed on 8 l when returned to seated with SPO2 return to 885 within 30". Placed back on 5 L with SPO2 90%(Finger sensor used.)  Patient will benefit from CIR to improve endurance  In order to be home alone and perform ADL's independently as family works.   Follow Up Recommendations  CIR     Equipment Recommendations  None recommended by PT    Recommendations for Other Services       Precautions / Restrictions Precautions Precaution Comments: monitor sats, on HFNC    Mobility  Bed Mobility               General bed mobility comments: Pt OOB in recliner  Transfers   Equipment used: Rolling walker (2 wheeled)   Sit to Stand: Supervision         General transfer comment: stands easily from recliner  Ambulation/Gait Ambulation/Gait assistance: Supervision Gait Distance (Feet): 85 Feet Assistive device: Rolling walker (2 wheeled) Gait Pattern/deviations: Step-through pattern;Decreased stride length     General Gait Details: ambulated on ^ L HFNC, gait steady using Rw   Stairs             Wheelchair Mobility    Modified Rankin (Stroke Patients Only)       Balance     Sitting balance-Leahy Scale: Good     Standing balance support: No upper extremity supported Standing balance-Leahy Scale: Fair                              Cognition Arousal/Alertness: Awake/alert                                             Exercises      General Comments        Pertinent Vitals/Pain Pain Assessment: No/denies pain    Home Living                      Prior Function            PT Goals (current goals can now be found in the care plan section) Progress towards PT goals: Progressing toward goals    Frequency    Min 3X/week      PT Plan Current plan remains appropriate    Co-evaluation              AM-PAC PT "6 Clicks" Mobility   Outcome Measure  Help needed turning from your back to your side while in a flat bed without using bedrails?: None Help needed moving from lying on your back to sitting on the side of a flat bed without using bedrails?: None Help needed moving to and from a bed to a chair (including a wheelchair)?: A Little Help needed standing up from  a chair using your arms (e.g., wheelchair or bedside chair)?: A Little Help needed to walk in hospital room?: A Little Help needed climbing 3-5 steps with a railing? : A Lot 6 Click Score: 19    End of Session Equipment Utilized During Treatment: Oxygen Activity Tolerance: Patient tolerated treatment well Patient left: in chair;with call bell/phone within reach Nurse Communication: Mobility status;Other (comment) PT Visit Diagnosis: Difficulty in walking, not elsewhere classified (R26.2);Unsteadiness on feet (R26.81)     Time: 1045-1100 PT Time Calculation (min) (ACUTE ONLY): 15 min  Charges:  $Gait Training: 8-22 mins                     [place] Pager [phone] Office [phone]    Claretha Cooper 10/23/2020, 3:56 PM

## 2020-10-24 LAB — GLUCOSE, CAPILLARY
Glucose-Capillary: 78 mg/dL (ref 70–99)
Glucose-Capillary: 104 mg/dL — ABNORMAL HIGH (ref 70–99)
Glucose-Capillary: 111 mg/dL — ABNORMAL HIGH (ref 70–99)
Glucose-Capillary: 136 mg/dL — ABNORMAL HIGH (ref 70–99)

## 2020-10-24 NOTE — Plan of Care (Signed)

## 2020-10-24 NOTE — Progress Notes (Signed)
Patients oxygen weaned down from 5LPM, now saturating at 95% on 2L. Will continue to wean and assess.    10/24/20 1315 10/24/20 1610 10/24/20 1750  Assess: MEWS Score  SpO2 93 % 96 % 95 %  O2 Device Nasal Cannula Nasal Cannula Nasal Cannula  Patient Activity (if Appropriate)  --  In chair In chair  O2 Flow Rate (L/min) 5 L/min 4 L/min 2 L/min

## 2020-10-24 NOTE — Progress Notes (Signed)
PROGRESS NOTE  Jean Shaffer  MEQ:[id number] DOB: [birth year] DOA: 09/30/2020 PCP: [name], MD   Brief Narrative: Jean Shaffer is a 69 y.o. female with a history of asthma, COPD, HTN, hypothyroidism, and chronic back pain who was admitted 10/7 for covid-19 pneumonia after being found to be hypoxic at the infusion center prior to receiving monoclonal antibody. She has received remdesivir, baricitinib, and steroids with stabilization though she remains dependent on high flow supplemental oxygen.  10/22/2020: Patient seen.  No new changes.  Patient is awaiting disposition.  Plan is for patient to be discharged.  CIR if possible, if not, LTAC would be considered.  10/24/2020: Patient seen.  No new complaints. Awaiting possible CIR. Titrate supplemental oxygen to keep O2 sats equal to greater than 89%.  Assessment & Plan: Active Problems:   COVID-19   Acute respiratory failure with hypoxia (HCC)   Shortness of breath  Acute hypoxemic respiratory failure due to covid-19 pneumonia: No longer requires isolation.  - Wean oxygen as tolerated, down to 8L. CIR now consulted to consider admission. - s/p remdesivir, baricitinib. Tapering steroids. - Encourage OOB, IS, FV - Enoxaparin prophylactic dose.  - Encouraged to get vaccinated. 10/24/2020: Patient remained stable.  Continue to manage expectantly.  Patient is currently on 4.5 L of supplemental oxygen.  COPD: Stable.  - Continue prn BDs  LFT elevation: Resolved  HTN:  - Controlled.  Depression: Stable.  - Continue bupropion  Chronic back pain:  - Continue hydrocodone and frequent mobility -Controlled  Constipation:  - Continue scheduled bowel regimen and add milk of magnesia which helped in the past.   GERD:  - Start PPI - TUMS prn  Hypothyroidism:  - Continue synthroid.   Insomnia:  - Seems to be improving with tapering steroids - Continue trazodone  DVT prophylaxis: Lovenox Code Status: Full Family  Communication: None at bedside. Visitor restrictions may be lifted.  Disposition Plan:  Status is: Inpatient  Remains inpatient appropriate because:Inpatient level of care appropriate due to severity of illness. Requires continued improvement in hypoxia.  Dispo: The patient is from: Home              Anticipated d/c is to: Undetermined. CIR consult pending.               Anticipated d/c date is: > 3 days              Patient currently is not medically stable to d/c.  Consultants:   None  Procedures:   None  Antimicrobials:  Remdesivir   Subjective: No new compalints. Patient is stable.  Objective: Vitals:   10/23/20 2013 10/24/20 0415 10/24/20 0500 10/24/20 1315  BP: 120/67 133/72  (!) 105/53  Pulse: 98 82  99  Resp: (!) 25 15  18   Temp: 98.1 F (36.7 C) 97.9 F (36.6 C)  (!) 97.1 F (36.2 C)  TempSrc: Oral Oral    SpO2: 94% 92%  93%  Weight:   61.7 kg   Height:        Intake/Output Summary (Last 24 hours) at 10/24/2020 1615 Last data filed at 10/24/2020 1300 Gross per 24 hour  Intake 960 ml  Output 3 ml  Net 957 ml   Filed Weights   10/21/20 0448 10/22/20 0500 10/24/20 0500  Weight: 63.1 kg 63.9 kg 61.7 kg   General: Not in any distress. Pulm: Clear to auscultation. CV: S1S2. GI: Abdomen soft, non-tender, non-distended, with normoactive bowel sounds.  Ext: No leg  edema Neuro: Alert and oriented.  Patient moves all extremities.   CBG: Recent Labs  Lab 10/23/20 1134 10/23/20 1653 10/23/20 2017 10/24/20 0737 10/24/20 1149  GLUCAP 87 237* 122* 78 104*   Scheduled Meds: . amLODipine  2.5 mg Oral Daily  . vitamin C  500 mg Oral Daily  . aspirin EC  81 mg Oral Daily  . bisacodyl  10 mg Oral Daily  . buPROPion  150 mg Oral Daily  . docusate sodium  100 mg Oral BID  . enoxaparin (LOVENOX) injection  40 mg Subcutaneous Q24H  . feeding supplement  237 mL Oral BID BM  . fluticasone furoate-vilanterol  1 puff Inhalation Daily  . insulin aspart  0-9  Units Subcutaneous TID WC  . levothyroxine  50 mcg Oral QAC breakfast  . multivitamin  15 mL Oral Daily  . pantoprazole  40 mg Oral Daily  . polyethylene glycol  17 g Oral Daily  . predniSONE  40 mg Oral Q breakfast  . senna  1 tablet Oral Daily  . traZODone  100 mg Oral QHS  . umeclidinium bromide  1 puff Inhalation Daily  . zinc sulfate  220 mg Oral Daily   Continuous Infusions:   LOS: 24 days   Time spent: 25 minutes.  Bonnell Public, MD [facility] [email or web address] 10/24/2020, 4:15 PM

## 2020-10-25 ENCOUNTER — Inpatient Hospital Stay (HOSPITAL_COMMUNITY)
Admission: RE | Admit: 2020-10-25 | Discharge: 2020-11-02 | DRG: 945 | Disposition: A | Discharge status: Home Health | Payer: Medicare Other | Source: Other Acute Inpatient Hospital | Attending: Physical Medicine and Rehabilitation | Admitting: Physical Medicine and Rehabilitation

## 2020-10-25 ENCOUNTER — Encounter (HOSPITAL_COMMUNITY): Payer: Self-pay | Admitting: Physical Medicine and Rehabilitation

## 2020-10-25 ENCOUNTER — Other Ambulatory Visit: Payer: Self-pay

## 2020-10-25 ENCOUNTER — Other Ambulatory Visit: Payer: Self-pay | Admitting: Internal Medicine

## 2020-10-25 DIAGNOSIS — M19042 Primary osteoarthritis, left hand: Secondary | ICD-10-CM | POA: Diagnosis present

## 2020-10-25 DIAGNOSIS — R739 Hyperglycemia, unspecified: Secondary | ICD-10-CM | POA: Diagnosis present

## 2020-10-25 DIAGNOSIS — E039 Hypothyroidism, unspecified: Secondary | ICD-10-CM | POA: Diagnosis present

## 2020-10-25 DIAGNOSIS — E43 Unspecified severe protein-calorie malnutrition: Secondary | ICD-10-CM | POA: Diagnosis present

## 2020-10-25 DIAGNOSIS — D72829 Elevated white blood cell count, unspecified: Secondary | ICD-10-CM | POA: Diagnosis present

## 2020-10-25 DIAGNOSIS — M19041 Primary osteoarthritis, right hand: Secondary | ICD-10-CM | POA: Diagnosis present

## 2020-10-25 DIAGNOSIS — M170 Bilateral primary osteoarthritis of knee: Secondary | ICD-10-CM | POA: Diagnosis present

## 2020-10-25 DIAGNOSIS — Z87891 Personal history of nicotine dependence: Secondary | ICD-10-CM

## 2020-10-25 DIAGNOSIS — R0902 Hypoxemia: Secondary | ICD-10-CM | POA: Diagnosis present

## 2020-10-25 DIAGNOSIS — W010XXA Fall on same level from slipping, tripping and stumbling without subsequent striking against object, initial encounter: Secondary | ICD-10-CM | POA: Diagnosis not present

## 2020-10-25 DIAGNOSIS — F411 Generalized anxiety disorder: Secondary | ICD-10-CM

## 2020-10-25 DIAGNOSIS — S53449A Ulnar collateral ligament sprain of unspecified elbow, initial encounter: Secondary | ICD-10-CM

## 2020-10-25 DIAGNOSIS — Z6821 Body mass index (BMI) 21.0-21.9, adult: Secondary | ICD-10-CM | POA: Diagnosis not present

## 2020-10-25 DIAGNOSIS — G4700 Insomnia, unspecified: Secondary | ICD-10-CM | POA: Diagnosis present

## 2020-10-25 DIAGNOSIS — G8929 Other chronic pain: Secondary | ICD-10-CM | POA: Diagnosis present

## 2020-10-25 DIAGNOSIS — Z9842 Cataract extraction status, left eye: Secondary | ICD-10-CM

## 2020-10-25 DIAGNOSIS — M545 Low back pain, unspecified: Secondary | ICD-10-CM | POA: Diagnosis present

## 2020-10-25 DIAGNOSIS — Z794 Long term (current) use of insulin: Secondary | ICD-10-CM

## 2020-10-25 DIAGNOSIS — Z8673 Personal history of transient ischemic attack (TIA), and cerebral infarction without residual deficits: Secondary | ICD-10-CM | POA: Diagnosis not present

## 2020-10-25 DIAGNOSIS — Y92009 Unspecified place in unspecified non-institutional (private) residence as the place of occurrence of the external cause: Secondary | ICD-10-CM | POA: Diagnosis not present

## 2020-10-25 DIAGNOSIS — F32A Depression, unspecified: Secondary | ICD-10-CM | POA: Diagnosis present

## 2020-10-25 DIAGNOSIS — Z961 Presence of intraocular lens: Secondary | ICD-10-CM | POA: Diagnosis present

## 2020-10-25 DIAGNOSIS — T380X5A Adverse effect of glucocorticoids and synthetic analogues, initial encounter: Secondary | ICD-10-CM | POA: Diagnosis present

## 2020-10-25 DIAGNOSIS — D72828 Other elevated white blood cell count: Secondary | ICD-10-CM | POA: Diagnosis present

## 2020-10-25 DIAGNOSIS — Z8249 Family history of ischemic heart disease and other diseases of the circulatory system: Secondary | ICD-10-CM

## 2020-10-25 DIAGNOSIS — R0600 Dyspnea, unspecified: Secondary | ICD-10-CM

## 2020-10-25 DIAGNOSIS — Z79890 Hormone replacement therapy (postmenopausal): Secondary | ICD-10-CM

## 2020-10-25 DIAGNOSIS — Z79899 Other long term (current) drug therapy: Secondary | ICD-10-CM | POA: Diagnosis not present

## 2020-10-25 DIAGNOSIS — Y92239 Unspecified place in hospital as the place of occurrence of the external cause: Secondary | ICD-10-CM | POA: Diagnosis not present

## 2020-10-25 DIAGNOSIS — W19XXXA Unspecified fall, initial encounter: Secondary | ICD-10-CM

## 2020-10-25 DIAGNOSIS — U099 Post covid-19 condition, unspecified: Secondary | ICD-10-CM | POA: Diagnosis present

## 2020-10-25 DIAGNOSIS — Z833 Family history of diabetes mellitus: Secondary | ICD-10-CM

## 2020-10-25 DIAGNOSIS — Z90710 Acquired absence of both cervix and uterus: Secondary | ICD-10-CM

## 2020-10-25 DIAGNOSIS — Z7982 Long term (current) use of aspirin: Secondary | ICD-10-CM | POA: Diagnosis not present

## 2020-10-25 DIAGNOSIS — I10 Essential (primary) hypertension: Secondary | ICD-10-CM | POA: Diagnosis present

## 2020-10-25 DIAGNOSIS — J449 Chronic obstructive pulmonary disease, unspecified: Secondary | ICD-10-CM | POA: Diagnosis present

## 2020-10-25 DIAGNOSIS — R5381 Other malaise: Principal | ICD-10-CM | POA: Diagnosis present

## 2020-10-25 DIAGNOSIS — S53442A Ulnar collateral ligament sprain of left elbow, initial encounter: Secondary | ICD-10-CM | POA: Diagnosis not present

## 2020-10-25 DIAGNOSIS — Z811 Family history of alcohol abuse and dependence: Secondary | ICD-10-CM

## 2020-10-25 DIAGNOSIS — R0602 Shortness of breath: Secondary | ICD-10-CM

## 2020-10-25 DIAGNOSIS — Z9841 Cataract extraction status, right eye: Secondary | ICD-10-CM

## 2020-10-25 LAB — GLUCOSE, CAPILLARY
Glucose-Capillary: 73 mg/dL (ref 70–99)
Glucose-Capillary: 148 mg/dL — ABNORMAL HIGH (ref 70–99)
Glucose-Capillary: 112 mg/dL — ABNORMAL HIGH (ref 70–99)
Glucose-Capillary: 121 mg/dL — ABNORMAL HIGH (ref 70–99)

## 2020-10-25 MED ORDER — TRAZODONE HCL 50 MG PO TABS
100.0000 mg | ORAL_TABLET | Freq: Every day | ORAL | Status: DC
  Administered 2020-10-25 – 2020-11-01 (×8): 100 mg via ORAL
  Filled 2020-10-25 (×8): qty 2

## 2020-10-25 MED ORDER — UMECLIDINIUM BROMIDE 62.5 MCG/INH IN AEPB: 1.0000 | INHALATION_SPRAY | Freq: Every day | RESPIRATORY_TRACT | 0 refills | Status: DC

## 2020-10-25 MED ORDER — GUAIFENESIN-DM 100-10 MG/5ML PO SYRP: 10.0000 mL | ORAL_SOLUTION | ORAL | Status: DC | PRN

## 2020-10-25 MED ORDER — ONDANSETRON HCL 4 MG/2ML IJ SOLN: 4.0000 mg | Freq: Four times a day (QID) | INTRAMUSCULAR | Status: DC | PRN

## 2020-10-25 MED ORDER — ZINC SULFATE 220 (50 ZN) MG PO CAPS
220.0000 mg | ORAL_CAPSULE | Freq: Every day | ORAL | Status: DC
  Administered 2020-10-26 – 2020-11-02 (×8): 220 mg via ORAL
  Filled 2020-10-25 (×8): qty 1

## 2020-10-25 MED ORDER — HYDROCOD POLST-CPM POLST ER 10-8 MG/5ML PO SUER: 5.0000 mL | Freq: Two times a day (BID) | ORAL | Status: DC | PRN

## 2020-10-25 MED ORDER — PREDNISONE 5 MG PO TABS: 5.0000 mg | ORAL_TABLET | Freq: Every day | ORAL | Status: DC

## 2020-10-25 MED ORDER — PROCHLORPERAZINE 25 MG RE SUPP: 12.5000 mg | Freq: Four times a day (QID) | RECTAL | Status: DC | PRN

## 2020-10-25 MED ORDER — INSULIN ASPART 100 UNIT/ML ~~LOC~~ SOLN
0.0000 [IU] | Freq: Three times a day (TID) | SUBCUTANEOUS | Status: DC
  Administered 2020-10-26 – 2020-10-27 (×3): 2 [IU] via SUBCUTANEOUS
  Administered 2020-10-28: 1 [IU] via SUBCUTANEOUS
  Administered 2020-10-28 – 2020-10-29 (×2): 2 [IU] via SUBCUTANEOUS
  Administered 2020-10-30: 1 [IU] via SUBCUTANEOUS

## 2020-10-25 MED ORDER — PANTOPRAZOLE SODIUM 40 MG PO TBEC
40.0000 mg | DELAYED_RELEASE_TABLET | Freq: Every day | ORAL | Status: DC
  Administered 2020-10-26 – 2020-11-02 (×8): 40 mg via ORAL
  Filled 2020-10-25 (×8): qty 1

## 2020-10-25 MED ORDER — PREDNISONE 5 MG PO TABS
30.0000 mg | ORAL_TABLET | Freq: Every day | ORAL | Status: AC
  Administered 2020-10-27 – 2020-10-30 (×4): 30 mg via ORAL
  Filled 2020-10-25 (×4): qty 1

## 2020-10-25 MED ORDER — PREDNISONE 20 MG PO TABS
20.0000 mg | ORAL_TABLET | Freq: Every day | ORAL | Status: DC
  Administered 2020-10-31 – 2020-11-02 (×3): 20 mg via ORAL
  Filled 2020-10-25 (×3): qty 1

## 2020-10-25 MED ORDER — INSULIN ASPART 100 UNIT/ML ~~LOC~~ SOLN
0.0000 [IU] | Freq: Three times a day (TID) | SUBCUTANEOUS | 11 refills | Status: DC
Start: 2020-10-25 — End: 2020-11-02

## 2020-10-25 MED ORDER — ACETAMINOPHEN 325 MG PO TABS
325.0000 mg | ORAL_TABLET | ORAL | Status: DC | PRN
  Administered 2020-10-28 – 2020-11-02 (×4): 650 mg via ORAL
  Filled 2020-10-25 (×4): qty 2

## 2020-10-25 MED ORDER — HYDROCODONE-ACETAMINOPHEN 5-325 MG PO TABS: 1.0000 | ORAL_TABLET | Freq: Every day | ORAL | Status: DC | PRN

## 2020-10-25 MED ORDER — POLYETHYLENE GLYCOL 3350 17 G PO PACK: 17.0000 g | PACK | Freq: Every day | ORAL | Status: DC | PRN

## 2020-10-25 MED ORDER — ONDANSETRON HCL 4 MG PO TABS: 4.0000 mg | ORAL_TABLET | Freq: Four times a day (QID) | ORAL | Status: DC | PRN

## 2020-10-25 MED ORDER — CALCIUM CARBONATE ANTACID 500 MG PO CHEW: 1.0000 | CHEWABLE_TABLET | Freq: Two times a day (BID) | ORAL | Status: DC | PRN

## 2020-10-25 MED ORDER — PREDNISONE 5 MG PO TABS: 10.0000 mg | ORAL_TABLET | Freq: Every day | ORAL | Status: DC

## 2020-10-25 MED ORDER — LIP MEDEX EX OINT: TOPICAL_OINTMENT | CUTANEOUS | 0 refills | Status: DC | PRN

## 2020-10-25 MED ORDER — BUPROPION HCL ER (XL) 150 MG PO TB24
150.0000 mg | ORAL_TABLET | Freq: Every day | ORAL | Status: DC
  Administered 2020-10-26 – 2020-11-02 (×8): 150 mg via ORAL
  Filled 2020-10-25 (×8): qty 1

## 2020-10-25 MED ORDER — UMECLIDINIUM BROMIDE 62.5 MCG/INH IN AEPB
1.0000 | INHALATION_SPRAY | Freq: Every day | RESPIRATORY_TRACT | Status: DC
  Administered 2020-10-26 – 2020-11-02 (×8): 1 via RESPIRATORY_TRACT
  Filled 2020-10-25 (×2): qty 7

## 2020-10-25 MED ORDER — LEVOTHYROXINE SODIUM 50 MCG PO TABS
50.0000 ug | ORAL_TABLET | Freq: Every day | ORAL | Status: DC
  Administered 2020-10-26 – 2020-11-02 (×8): 50 ug via ORAL
  Filled 2020-10-25 (×8): qty 1

## 2020-10-25 MED ORDER — FLUTICASONE FUROATE-VILANTEROL 100-25 MCG/INH IN AEPB
1.0000 | INHALATION_SPRAY | Freq: Every day | RESPIRATORY_TRACT | Status: DC
  Administered 2020-10-26 – 2020-11-02 (×8): 1 via RESPIRATORY_TRACT
  Filled 2020-10-25: qty 28

## 2020-10-25 MED ORDER — SENNA 8.6 MG PO TABS: 1.0000 | ORAL_TABLET | Freq: Every day | ORAL | 0 refills | Status: DC

## 2020-10-25 MED ORDER — BISACODYL 10 MG RE SUPP
10.0000 mg | Freq: Every day | RECTAL | Status: DC | PRN
  Filled 2020-10-25: qty 1

## 2020-10-25 MED ORDER — SENNOSIDES-DOCUSATE SODIUM 8.6-50 MG PO TABS
2.0000 | ORAL_TABLET | Freq: Every day | ORAL | Status: DC
  Administered 2020-10-25 – 2020-11-01 (×8): 2 via ORAL
  Filled 2020-10-25 (×8): qty 2

## 2020-10-25 MED ORDER — TRAZODONE HCL 50 MG PO TABS: 25.0000 mg | ORAL_TABLET | Freq: Every evening | ORAL | Status: DC | PRN

## 2020-10-25 MED ORDER — BISACODYL 5 MG PO TBEC
10.0000 mg | DELAYED_RELEASE_TABLET | Freq: Every day | ORAL | 0 refills | Status: DC
Start: 2020-10-26 — End: 2020-11-02

## 2020-10-25 MED ORDER — PROCHLORPERAZINE EDISYLATE 10 MG/2ML IJ SOLN: 5.0000 mg | Freq: Four times a day (QID) | INTRAMUSCULAR | Status: DC | PRN

## 2020-10-25 MED ORDER — PANTOPRAZOLE SODIUM 40 MG PO TBEC
40.0000 mg | DELAYED_RELEASE_TABLET | Freq: Every day | ORAL | 0 refills | Status: DC
Start: 2020-10-26 — End: 2020-11-02

## 2020-10-25 MED ORDER — GUAIFENESIN-DM 100-10 MG/5ML PO SYRP: 5.0000 mL | ORAL_SOLUTION | Freq: Four times a day (QID) | ORAL | Status: DC | PRN

## 2020-10-25 MED ORDER — HYDROXYZINE HCL 25 MG PO TABS
25.0000 mg | ORAL_TABLET | Freq: Three times a day (TID) | ORAL | Status: DC | PRN
  Administered 2020-10-25 – 2020-11-01 (×7): 25 mg via ORAL
  Filled 2020-10-25 (×7): qty 1

## 2020-10-25 MED ORDER — ADULT MULTIVITAMIN LIQUID CH
15.0000 mL | Freq: Every day | ORAL | 0 refills | Status: DC
Start: 2020-10-26 — End: 2023-11-19

## 2020-10-25 MED ORDER — ASCORBIC ACID 500 MG PO TABS
500.0000 mg | ORAL_TABLET | Freq: Every day | ORAL | Status: DC
  Administered 2020-10-26 – 2020-11-02 (×8): 500 mg via ORAL
  Filled 2020-10-25 (×8): qty 1

## 2020-10-25 MED ORDER — ENSURE ENLIVE PO LIQD
237.0000 mL | Freq: Two times a day (BID) | ORAL | Status: DC
  Administered 2020-10-25 – 2020-10-26 (×3): 237 mL via ORAL

## 2020-10-25 MED ORDER — PREDNISONE 20 MG PO TABS: 40.0000 mg | ORAL_TABLET | Freq: Every day | ORAL | Status: DC

## 2020-10-25 MED ORDER — ENSURE ENLIVE PO LIQD
237.0000 mL | Freq: Two times a day (BID) | ORAL | 12 refills | Status: DC
Start: 2020-10-25 — End: 2020-11-02

## 2020-10-25 MED ORDER — CALCIUM CARBONATE ANTACID 500 MG PO CHEW: 1.0000 | CHEWABLE_TABLET | Freq: Two times a day (BID) | ORAL | 0 refills | Status: DC | PRN

## 2020-10-25 MED ORDER — ZINC SULFATE 220 (50 ZN) MG PO CAPS
220.0000 mg | ORAL_CAPSULE | Freq: Every day | ORAL | 0 refills | Status: DC
Start: 2020-10-26 — End: 2023-11-19

## 2020-10-25 MED ORDER — AMLODIPINE BESYLATE 2.5 MG PO TABS
2.5000 mg | ORAL_TABLET | Freq: Every day | ORAL | Status: DC
  Administered 2020-10-26 – 2020-11-02 (×8): 2.5 mg via ORAL
  Filled 2020-10-25 (×8): qty 1

## 2020-10-25 MED ORDER — PREDNISONE 20 MG PO TABS
40.0000 mg | ORAL_TABLET | Freq: Every day | ORAL | Status: AC
  Administered 2020-10-26: 40 mg via ORAL
  Filled 2020-10-25: qty 2

## 2020-10-25 MED ORDER — FLEET ENEMA 7-19 GM/118ML RE ENEM: 1.0000 | ENEMA | Freq: Once | RECTAL | Status: DC | PRN

## 2020-10-25 MED ORDER — ENOXAPARIN SODIUM 40 MG/0.4ML ~~LOC~~ SOLN
40.0000 mg | SUBCUTANEOUS | Status: DC
  Administered 2020-10-25 – 2020-11-01 (×8): 40 mg via SUBCUTANEOUS
  Filled 2020-10-25 (×7): qty 0.4

## 2020-10-25 MED ORDER — ASPIRIN EC 81 MG PO TBEC
81.0000 mg | DELAYED_RELEASE_TABLET | Freq: Every day | ORAL | Status: DC
  Administered 2020-10-26 – 2020-11-02 (×8): 81 mg via ORAL
  Filled 2020-10-25 (×8): qty 1

## 2020-10-25 MED ORDER — DIPHENHYDRAMINE HCL 12.5 MG/5ML PO ELIX: 12.5000 mg | ORAL_SOLUTION | Freq: Four times a day (QID) | ORAL | Status: DC | PRN

## 2020-10-25 MED ORDER — ADULT MULTIVITAMIN LIQUID CH
15.0000 mL | Freq: Every day | ORAL | Status: DC
  Administered 2020-10-26 – 2020-11-02 (×8): 15 mL via ORAL
  Filled 2020-10-25 (×8): qty 15

## 2020-10-25 MED ORDER — ASCORBIC ACID 500 MG PO TABS
500.0000 mg | ORAL_TABLET | Freq: Every day | ORAL | 0 refills | Status: DC
Start: 2020-10-26 — End: 2023-08-01

## 2020-10-25 MED ORDER — PROCHLORPERAZINE MALEATE 5 MG PO TABS: 5.0000 mg | ORAL_TABLET | Freq: Four times a day (QID) | ORAL | Status: DC | PRN

## 2020-10-25 MED ORDER — LIP MEDEX EX OINT
1.0000 "application " | TOPICAL_OINTMENT | CUTANEOUS | Status: DC | PRN
  Administered 2020-10-29: 1 via TOPICAL
  Filled 2020-10-25: qty 7

## 2020-10-25 MED ORDER — ALUM & MAG HYDROXIDE-SIMETH 200-200-20 MG/5ML PO SUSP: 30.0000 mL | ORAL | Status: DC | PRN

## 2020-10-25 MED ORDER — PREDNISONE 20 MG PO TABS
40.0000 mg | ORAL_TABLET | Freq: Every day | ORAL | 0 refills | Status: DC
Start: 2020-10-26 — End: 2020-11-02

## 2020-10-25 NOTE — PMR Pre-admission (Signed)
PMR Admission Coordinator Pre-Admission Assessment  Patient: Jean Shaffer is an 69 y.o., female MRN: [id number] DOB: [birth year] Height: 5\' 4"  (162.6 cm) Weight: 63.4 kg  Insurance Information HMO:     PPO:      PCP:      IPA:      80/20:      OTHER:  PRIMARY: Medicare A and B      Policy#: [id number]      Subscriber: pt CM Name:       Phone#:      Fax#:  Pre-Cert#:       Employer:  Benefits:  Phone #:      Name:  Eff. Date: 11/25/15     Deduct: $1484      Out of Pocket Max: n/a      Life Max: n/a CIR: 100%      SNF: 20 full days Outpatient: 80%     Co-Pay: 20% Home Health: 100%      Co-Pay:  DME: 80%     Co-Pay: 20% Providers: pt choice SECONDARY: Medicaid ([employer])      Policy#: [id number] s     Phone#:   Financial Counselor:       Phone#:   The "Data Collection Information Summary" for patients in Inpatient Rehabilitation Facilities with attached "Privacy Act [place] Records" was provided and verbally reviewed with: Patient  Emergency Contact Information Contact Information    Name Relation Home Work Mobile   [place] [phone]  [phone]      Current Medical History  Patient Admitting Diagnosis: post covid debility  History of Present Illness: Pt is a 69 y/o female with PMH of asthma, COPD, HTN, hypothyroidism, and chronic back pain admitted to [facility] on 10/7. She was found to be hypoxic at the infusion center prior to receiving monoclonal antibody therapy.  Pt has received remdesivir, baricitinib, and steroids and has stabilized, but continues to require significant amounts of supplemental O2 (5-8L).  Therapy evaluations were completed and pt was recommended for CIR to advance mobility, progress cardiopulmonary endurance, and reduce supplemental O2 need.   Patient's medical record from [facility] has been reviewed by the rehabilitation admission coordinator and physician.  Past Medical History  Past Medical History:  Diagnosis Date  .  Anemia   . 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 Asthma   . Chronic back pain   . COPD (chronic obstructive pulmonary disease) ([place])    per pt dx in ~2014  . Heart murmur    as a child per pt  . Hypertension   . Hypothyroidism   . Stroke [facility])    per pt "maybe last year or the year before last, they told me I might've had a TIA, but I blacked out"  . Thyroid disease     Family History   family history includes Alcohol abuse in her father; Cancer in her father, maternal grandfather, and mother; Diabetes in her paternal grandfather and paternal grandmother; Heart disease in her father, maternal grandfather, and paternal grandmother.  Prior Rehab/Hospitalizations Has the patient had prior rehab or hospitalizations prior to admission? No  Has the patient had major surgery during 100 days prior to admission? No   Current Medications  Current Facility-Administered Medications:  .  acetaminophen (TYLENOL) tablet 650 mg, 650 mg, Oral, Q6H PRN, Lavina Hamman, MD, 650 mg at 10/10/20 2056 .  amLODipine (NORVASC) tablet 2.5 mg, 2.5 mg, Oral, Daily, Landis Gandy  G, MD, 2.5 mg at 10/25/20 0933 .  ascorbic acid (VITAMIN C) tablet 500 mg, 500 mg, Oral, Daily, Lavina Hamman, MD, 500 mg at 10/25/20 0934 .  aspirin EC tablet 81 mg, 81 mg, Oral, Daily, Lavina Hamman, MD, 81 mg at 10/25/20 0930 .  bisacodyl (DULCOLAX) EC tablet 10 mg, 10 mg, Oral, Daily, Georgette Shell, MD, 10 mg at 10/25/20 0931 .  buPROPion (WELLBUTRIN XL) 24 hr tablet 150 mg, 150 mg, Oral, Daily, Lavina Hamman, MD, 150 mg at 10/25/20 0932 .  calcium carbonate (TUMS - dosed in mg elemental calcium) chewable tablet 200 mg of elemental calcium, 1 tablet, Oral, BID PRN, Vance Gather B, MD, 200 mg of elemental calcium at 10/21/20 1605 .  chlorpheniramine-HYDROcodone (TUSSIONEX) 10-8 MG/5ML suspension 5 mL, 5 mL, Oral, Q12H PRN, Lavina Hamman, MD, 5 mL at 10/06/20 2017 .  docusate sodium (COLACE)  capsule 100 mg, 100 mg, Oral, BID, Lavina Hamman, MD, 100 mg at 10/25/20 0933 .  enoxaparin (LOVENOX) injection 40 mg, 40 mg, Subcutaneous, Q24H, Lavina Hamman, MD, 40 mg at 10/24/20 2107 .  feeding supplement (ENSURE ENLIVE) (ENSURE ENLIVE) liquid 237 mL, 237 mL, Oral, BID BM, Elodia Florence., MD, 237 mL at 10/25/20 0939 .  fluticasone furoate-vilanterol (BREO ELLIPTA) 100-25 MCG/INH 1 puff, 1 puff, Inhalation, Daily, Lavina Hamman, MD, 1 puff at 10/25/20 0736 .  guaiFENesin-dextromethorphan (ROBITUSSIN DM) 100-10 MG/5ML syrup 10 mL, 10 mL, Oral, Q4H PRN, Lavina Hamman, MD, 10 mL at 10/11/20 2110 .  HYDROcodone-acetaminophen (NORCO/VICODIN) 5-325 MG per tablet 1 tablet, 1 tablet, Oral, Daily PRN, Lavina Hamman, MD, 1 tablet at 10/07/20 0539 .  hydrOXYzine (ATARAX/VISTARIL) tablet 25 mg, 25 mg, Oral, TID PRN, Elodia Florence., MD, 25 mg at 10/24/20 2107 .  insulin aspart (novoLOG) injection 0-9 Units, 0-9 Units, Subcutaneous, TID WC, Elodia Florence., MD, 3 Units at 10/23/20 1730 .  levothyroxine (SYNTHROID) tablet 50 mcg, 50 mcg, Oral, QAC breakfast, Lavina Hamman, MD, 50 mcg at 10/25/20 0601 .  lip balm (CARMEX) ointment, , Topical, PRN, Elodia Florence., MD .  magnesium hydroxide (MILK OF MAGNESIA) suspension 15 mL, 15 mL, Oral, Daily PRN, Patrecia Pour, MD, 15 mL at 10/21/20 1605 .  multivitamin liquid 15 mL, 15 mL, Oral, Daily, Georgette Shell, MD, 15 mL at 10/25/20 0930 .  ondansetron (ZOFRAN) tablet 4 mg, 4 mg, Oral, Q6H PRN **OR** ondansetron (ZOFRAN) injection 4 mg, 4 mg, Intravenous, Q6H PRN, Lavina Hamman, MD .  pantoprazole (PROTONIX) EC tablet 40 mg, 40 mg, Oral, Daily, Patrecia Pour, MD, 40 mg at 10/25/20 0932 .  polyethylene glycol (MIRALAX / GLYCOLAX) packet 17 g, 17 g, Oral, Daily, Lavina Hamman, MD, 17 g at 10/24/20 0957 .  predniSONE (DELTASONE) tablet 40 mg, 40 mg, Oral, Q breakfast, Patrecia Pour, MD, 40 mg at 10/25/20 0742 .  senna  (SENOKOT) tablet 8.6 mg, 1 tablet, Oral, Daily, Vance Gather B, MD, 8.6 mg at 10/25/20 0932 .  traZODone (DESYREL) tablet 100 mg, 100 mg, Oral, QHS, Georgette Shell, MD, 100 mg at 10/24/20 2107 .  umeclidinium bromide (INCRUSE ELLIPTA) 62.5 MCG/INH 1 puff, 1 puff, Inhalation, Daily, Lavina Hamman, MD, 1 puff at 10/25/20 0736 .  zinc sulfate capsule 220 mg, 220 mg, Oral, Daily, Lavina Hamman, MD, 220 mg at 10/25/20 8366  Patients Current Diet:  Diet Order  Diet regular Room service appropriate? Yes; Fluid consistency: Thin  Diet effective now                 Precautions / Restrictions Precautions Precautions: Fall Precaution Comments: monitor sats, on HFNC Restrictions Weight Bearing Restrictions: No   Has the patient had 2 or more falls or a fall with injury in the past year? No  Prior Activity Level Limited Community (1-2x/wk): independent prior to admit, no DME used  Prior Functional Level Self Care: Did the patient need help bathing, dressing, using the toilet or eating? Independent  Indoor Mobility: Did the patient need assistance with walking from room to room (with or without device)? Independent  Stairs: Did the patient need assistance with internal or external stairs (with or without device)? Independent  Functional Cognition: Did the patient need help planning regular tasks such as shopping or remembering to take medications? Independent  Home Assistive Devices / Equipment Home Assistive Devices/Equipment: Eyeglasses, Radio producer (specify quad or straight) Home Equipment: Cane - single point  Prior Device Use: Indicate devices/aids used by the patient prior to current illness, exacerbation or injury? None of the above  Current Functional Level Cognition  Overall Cognitive Status: Within Functional Limits for tasks assessed Orientation Level: Oriented X4 General Comments: less anxious when used NRB and less SOB    Extremity Assessment (includes  Sensation/Coordination)  Upper Extremity Assessment: Generalized weakness  Lower Extremity Assessment: Generalized weakness    ADLs  Overall ADL's : Needs assistance/impaired Eating/Feeding: Set up, Sitting Eating/Feeding Details (indicate cue type and reason): required set up to open food containers. patient seated in recliner. Grooming: Sitting, Wash/dry face, Wash/dry hands Grooming Details (indicate cue type and reason): washed face and hands seated on toilet in bathroom due to fatigue from walk to bathroom. Upper Body Bathing: Set up Lower Body Bathing: Set up, Sitting/lateral leans Upper Body Dressing : Set up, Sitting Lower Body Dressing: Set up, Sit to/from stand Lower Body Dressing Details (indicate cue type and reason): predominantly sitting Toilet Transfer: Supervision/safety, Ambulation, RW Toilet Transfer Details (indicate cue type and reason): Ambulated to bathroom with min assist for steadying on 15 L HFNC and 15 L NRB. Fatigued quickly and o2 sat dropped to 72%. Toileting- Clothing Manipulation and Hygiene: Sitting/lateral lean, Min guard Toileting - Clothing Manipulation Details (indicate cue type and reason): Patient able to manage clothing and performed pericare seated on toilet with side leands. min guard provided. Functional mobility during ADLs: Min guard General ADL Comments: today's session focusing on maximizing endurance needed for participation in self care and weaning O2 in order to facilitate D/C home, patient is motivated "I want to do as much as I can while you're here" wean patient to 10L with functional ambulation with desat to 80 after ~28ft, recovers within 30 seconds of seated rest. performed this x3    Mobility  Overal bed mobility: Independent Bed Mobility: Supine to Sit Sidelying to sit: Supervision Supine to sit: Modified independent (Device/Increase time) Sit to supine: Supervision Sit to sidelying: Supervision General bed mobility comments: Pt  OOB in recliner    Transfers  Overall transfer level: Needs assistance Equipment used: Rolling walker (2 wheeled) Transfers: Sit to/from Stand Sit to Stand: Supervision Stand pivot transfers: Min guard General transfer comment: stands easily from recliner    Ambulation / Gait / Stairs / Emergency planning/management officer  Ambulation/Gait Ambulation/Gait assistance: Scientist, forensic (Feet): 85 Feet Assistive device: Rolling walker (2 wheeled) Gait Pattern/deviations: Step-through pattern, Decreased stride length General Gait  Details: ambulated on ^ L HFNC, gait steady using Rw Gait velocity: decreased    Posture / Balance Balance Overall balance assessment: Needs assistance Sitting-balance support: Feet supported Sitting balance-Leahy Scale: Good Standing balance support: No upper extremity supported Standing balance-Leahy Scale: Fair Standing balance comment: statically patient able to stand without UE support, utilizes walker with ambulation "sometimes I'm wobbly"    Special needs/care consideration Oxygen 5-8 L via nasal cannula   Previous Home Environment (from acute therapy documentation) Living Arrangements: Children, Other relatives Available Help at Discharge: Family Type of Home: House Home Layout: One level Home Access: Stairs to enter Technical brewer of Steps: 1 Bathroom Shower/Tub: Chiropodist: [place]: No Additional Comments: Reports usinga cane PRN secondary to "back problems"  Discharge Living Setting Plans for Discharge Living Setting: Patient's home, Lives with (comment) (son and daughter in law) Type of Home at Discharge: House Discharge Home Layout: One level Discharge Home Access: Stairs to enter Entrance Stairs-Rails: None Entrance Stairs-Number of Steps: 1 Discharge Bathroom Shower/Tub: Tub/shower unit Discharge Bathroom Toilet: Standard Discharge Bathroom Accessibility: Yes How Accessible: Accessible via  walker Does the patient have any problems obtaining your medications?: No  Social/Family/Support Systems Anticipated Caregiver: mod I goals, contact Arloa Koh (son) Anticipated Caregiver's Contact Information: [phone] Ability/Limitations of Caregiver: works during the day Caregiver Availability: Evenings only Discharge Plan Discussed with Primary Caregiver: Yes Is Caregiver In Agreement with Plan?: Yes Does Caregiver/Family have Issues with Lodging/Transportation while Pt is in Rehab?: No  Goals Patient/Family Goal for Rehab: PT/OT mod I on 3L or less, SLP n/a  Expected length of stay: 9-11 days Pt/Family Agrees to Admission and willing to participate: Yes Program Orientation Provided & Reviewed with Pt/Caregiver Including Roles  & Responsibilities: Yes  Barriers to Discharge: Decreased caregiver support  Decrease burden of Care through IP rehab admission: n/a  Possible need for SNF placement upon discharge: No  Patient Condition: I have reviewed medical records from [facility], spoken with CM, and patient. I discussed via phone for inpatient rehabilitation assessment.  Patient will benefit from ongoing PT and OT, can actively participate in 3 hours of therapy a day 5 days of the week, and can make measurable gains during the admission.  Patient will also benefit from the coordinated team approach during an Inpatient Acute Rehabilitation admission.  The patient will receive intensive therapy as well as Rehabilitation physician, nursing, social worker, and care management interventions.  Due to safety, disease management, medication administration, pain management and patient education the patient requires 24 hour a day rehabilitation nursing.  The patient is currently supervision to min guard with mobility and basic ADLs.  Discharge setting and therapy post discharge at home with outpatient is anticipated.  Patient has agreed to participate in the Acute Inpatient Rehabilitation  Program and will admit today.  Preadmission Screen Completed By:  Michel Santee, PT, DPT 10/25/2020 11:18 AM ______________________________________________________________________   Discussed status with Dr. Ranell Patrick on 10/25/20  at 11:18 AM  and received approval for admission today.  Admission Coordinator:  Michel Santee, PT, DPT, time 11:18 AM Sudie Grumbling 10/25/20    Assessment/Plan: Diagnosis: COVID Debility 1. Does the need for close, 24 hr/day Medical supervision in concert with the patient's rehab needs make it unreasonable for this patient to be served in a less intensive setting? Yes 2. Co-Morbidities requiring supervision/potential complications: acute respiratory failure with hypoxia, shortness of breath, COPD, HTN, syncope, palpitations, steroid-induced insomnia, steroid-induced leukocytosis, impaired endurance 3. Due to  bladder management, bowel management, safety, skin/wound care, disease management, medication administration, pain management and patient education, does the patient require 24 hr/day rehab nursing? Yes 4. Does the patient require coordinated care of a physician, rehab nurse, PT, OT, and SLP to address physical and functional deficits in the context of the above medical diagnosis(es)? Yes Addressing deficits in the following areas: balance, endurance, locomotion, strength, transferring, bowel/bladder control, bathing, dressing, feeding, grooming, toileting and psychosocial support 5. Can the patient actively participate in an intensive therapy program of at least 3 hrs of therapy 5 days a week? Yes 6. The potential for patient to make measurable gains while on inpatient rehab is excellent 7. Anticipated functional outcomes upon discharge from inpatient rehab: modified independent PT, modified independent OT, independent SLP 8. Estimated rehab length of stay to reach the above functional goals is: 5-7 days 9. Anticipated discharge destination: Home 10. Overall  Rehab/Functional Prognosis: excellent   MD Signature: Leeroy Cha, MD

## 2020-10-25 NOTE — H&P (Signed)
Physical Medicine and Rehabilitation Admission H&P    Chief Complaint  Patient presents with  .  Post Covid debility    HPI: Jean Shaffer is a 69 year old female with history of HTN, DDD/OA, chronic LBP with radiculopathy, COPD who was admitted on 09/30/2020 with cough and shortness of breath with hypoxia due to COVID-19.  She was treated with remdesivir, baricitinib and steroids with improvement in respiratory status however continued to require high flow supplemental oxygen.  Pulmonary hygiene recommended and patient on slow steroid taper.  Blood pressure stabilizing therefore amlodipine resumed. She continues to have hypoxia with activity requiring HF oxygen and limited by debility. CIR recommended due to functional decline. Denies constipation, pain. Lives with her children and grandchildren. Continue to desat significantly with movement. Has steroid-induced leukocytosis and insomnia, the latter has improved with sleep aides.    Review of Systems  Constitutional: Positive for malaise/fatigue. Negative for fever.  HENT: Negative.   Eyes: Negative.   Respiratory: Positive for shortness of breath. Negative for cough.   Cardiovascular: Negative.   Gastrointestinal: Negative.   Genitourinary: Negative.   Musculoskeletal: Negative for back pain and myalgias.  Skin: Negative.   Neurological: Positive for sensory change (LLE numbness/tingling X few months) and weakness.  Endo/Heme/Allergies: Negative.   Psychiatric/Behavioral: Negative.     Past Medical History:  Diagnosis Date  . Anemia   . 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 Asthma   . Chronic back pain   . COPD (chronic obstructive pulmonary disease) ([place])    per pt dx in ~2014  . Heart murmur    as a child per pt  . Hypertension   . Hypothyroidism   . Stroke [facility])    per pt "maybe last year or the year before last, they told me I might've had a TIA, but I blacked out"  . Thyroid disease      Past Surgical History:  Procedure Laterality Date  . ABDOMINAL HYSTERECTOMY    . CATARACT EXTRACTION W/ INTRAOCULAR LENS IMPLANT Left 07/14/2014  . CATARACT EXTRACTION W/ INTRAOCULAR LENS IMPLANT Right 10/18/2015  . COLONOSCOPY    . EYE SURGERY    . IR RADIOLOGIST EVAL & MGMT  01/08/2020  . LAPAROSCOPIC APPENDECTOMY N/A 01/29/2020   Procedure: LAPAROSCOPIC  APPENDECTOMY;  Surgeon: Rolm Bookbinder, MD;  Location: [facility];  Service: General;  Laterality: N/A;  . MULTIPLE TOOTH EXTRACTIONS  2002  . NECK SURGERY    . SPINE SURGERY    . WISDOM TOOTH EXTRACTION     per pt 01/21/20    Family History  Problem Relation Age of Onset  . Alcohol abuse Father   . Cancer Father   . Heart disease Father   . Cancer Mother   . Cancer Maternal Grandfather   . Heart disease Maternal Grandfather   . Heart disease Paternal Grandmother   . Diabetes Paternal Grandmother   . Diabetes Paternal Grandfather     Social History:  reports that she has quit smoking--smoked 1/2 to 2 PPD. She has never used smokeless tobacco. She reports that she does not drink alcohol and does not use drugs.    Allergies: No Known Allergies    Medications Prior to Admission  Medication Sig Dispense Refill  . acetaminophen (TYLENOL) 325 MG tablet Take 2 tablets (650 mg total) by mouth every 6 (six) hours as needed for mild pain (or temp > 100).    Marland Kitchen amLODipine (NORVASC) 2.5 MG  tablet Take 2.5 mg by mouth daily.    Derrill Memo ON 10/26/2020] ascorbic acid (VITAMIN C) 500 MG tablet Take 1 tablet (500 mg total) by mouth daily. 1 tablet 0  . aspirin EC 81 MG tablet Take 81 mg by mouth daily.    Derrill Memo ON 10/26/2020] bisacodyl (DULCOLAX) 5 MG EC tablet Take 2 tablets (10 mg total) by mouth daily. 30 tablet 0  . buPROPion (WELLBUTRIN XL) 150 MG 24 hr tablet Take 150 mg by mouth daily.     . calcium carbonate (TUMS - DOSED IN MG ELEMENTAL CALCIUM) 500 MG chewable tablet Chew 1 tablet (200 mg of elemental calcium total) by mouth  2 (two) times daily as needed for indigestion or heartburn. 1 tablet 0  . docusate sodium (COLACE) 100 MG capsule Take 1 capsule (100 mg total) by mouth 2 (two) times daily. (Patient taking differently: Take 100 mg by mouth daily as needed for mild constipation. ) 10 capsule 0  . feeding supplement (ENSURE ENLIVE / ENSURE PLUS) LIQD Take 237 mLs by mouth 2 (two) times daily between meals. 237 mL 12  . fluticasone furoate-vilanterol (BREO ELLIPTA) 100-25 MCG/INH AEPB Inhale 1 puff into the lungs daily.     Marland Kitchen HYDROcodone-acetaminophen (NORCO/VICODIN) 5-325 MG tablet Take 1 tablet by mouth daily as needed for moderate pain.     Marland Kitchen insulin aspart (NOVOLOG) 100 UNIT/ML injection Inject 0-9 Units into the skin 3 (three) times daily with meals. 10 mL 11  . levothyroxine (SYNTHROID, LEVOTHROID) 50 MCG tablet Take 50 mcg by mouth daily before breakfast.     . lip balm (CARMEX) ointment Apply topically as needed for lip care. 7 g 0  . [START ON 10/26/2020] Multiple Vitamin (MULTIVITAMIN) LIQD Take 15 mLs by mouth daily. 30 mL 0  . [START ON 10/26/2020] pantoprazole (PROTONIX) 40 MG tablet Take 1 tablet (40 mg total) by mouth daily. 1 tablet 0  . polyethylene glycol (MIRALAX / GLYCOLAX) 17 g packet Take 17 g by mouth daily. (Patient taking differently: Take 17 g by mouth daily as needed for mild constipation. ) 14 each 0  . [START ON 10/26/2020] predniSONE (DELTASONE) 20 MG tablet Take 2 tablets (40 mg total) by mouth daily with breakfast. 1 tablet 0  . [START ON 10/26/2020] senna (SENOKOT) 8.6 MG TABS tablet Take 1 tablet (8.6 mg total) by mouth daily. 120 tablet 0  . traZODone (DESYREL) 100 MG tablet Take 100 mg by mouth at bedtime as needed for sleep.     Derrill Memo ON 10/26/2020] umeclidinium bromide (INCRUSE ELLIPTA) 62.5 MCG/INH AEPB Inhale 1 puff into the lungs daily. 1 each 0  . valACYclovir (VALTREX) 500 MG tablet Take 500 mg by mouth See admin instructions. Take 1 tablet by mouth twice daily for 3 days during  outbreak    . [START ON 10/26/2020] zinc sulfate 220 (50 Zn) MG capsule Take 1 capsule (220 mg total) by mouth daily. 1 capsule 0    Drug Regimen Review  Drug regimen was reviewed and remains appropriate with no significant issues identified    Home: Home Living Family/patient expects to be discharged to:: Private residence Living Arrangements: Children, Other relatives Available Help at Discharge: Family Type of Home: House Home Access: Stairs to enter Technical brewer of Steps: 1 Home Layout: One level Bathroom Shower/Tub: Chiropodist: Standard Home Equipment: Cane - single point Additional Comments: Reports usinga cane PRN secondary to "back problems"   Functional History: Prior Function Level of  Independence: Independent  Functional Status:  Mobility: Bed Mobility Overal bed mobility: Independent Bed Mobility: Supine to Sit Sidelying to sit: Supervision Supine to sit: Modified independent (Device/Increase time) Sit to supine: Supervision Sit to sidelying: Supervision General bed mobility comments: Pt OOB in recliner Transfers Overall transfer level: Needs assistance Equipment used: Rolling walker (2 wheeled) Transfers: Sit to/from Stand Sit to Stand: Supervision Stand pivot transfers: Min guard General transfer comment: stands easily from recliner Ambulation/Gait Ambulation/Gait assistance: Supervision Gait Distance (Feet): 85 Feet Assistive device: Rolling walker (2 wheeled) Gait Pattern/deviations: Step-through pattern, Decreased stride length General Gait Details: ambulated on ^ L HFNC, gait steady using Rw Gait velocity: decreased  ADL: ADL Overall ADL's : Needs assistance/impaired Eating/Feeding: Set up, Sitting Eating/Feeding Details (indicate cue type and reason): required set up to open food containers. patient seated in recliner. Grooming: Sitting, Wash/dry face, Wash/dry hands Grooming Details (indicate cue type and  reason): washed face and hands seated on toilet in bathroom due to fatigue from walk to bathroom. Upper Body Bathing: Set up Lower Body Bathing: Set up, Sitting/lateral leans Upper Body Dressing : Set up, Sitting Lower Body Dressing: Set up, Sit to/from stand Lower Body Dressing Details (indicate cue type and reason): predominantly sitting Toilet Transfer: Supervision/safety, Ambulation, RW Toilet Transfer Details (indicate cue type and reason): Ambulated to bathroom with min assist for steadying on 15 L HFNC and 15 L NRB. Fatigued quickly and o2 sat dropped to 72%. Toileting- Clothing Manipulation and Hygiene: Sitting/lateral lean, Min guard Toileting - Clothing Manipulation Details (indicate cue type and reason): Patient able to manage clothing and performed pericare seated on toilet with side leands. min guard provided. Functional mobility during ADLs: Min guard General ADL Comments: today's session focusing on maximizing endurance needed for participation in self care and weaning O2 in order to facilitate D/C home, patient is motivated "I want to do as much as I can while you're here" wean patient to 10L with functional ambulation with desat to 80 after ~77ft, recovers within 30 seconds of seated rest. performed this x3  Cognition: Cognition Overall Cognitive Status: Within Functional Limits for tasks assessed Orientation Level: Oriented X4 Cognition Arousal/Alertness: Awake/alert Behavior During Therapy: WFL for tasks assessed/performed Overall Cognitive Status: Within Functional Limits for tasks assessed General Comments: less anxious when used NRB and less SOB   Blood pressure 127/64, pulse 99, temperature 98 F (36.7 C), temperature source Oral, resp. rate 16, height 5\' 4"  (1.626 m), weight 60.8 kg, SpO2 96 %. Physical Exam  General: Alert and oriented x 3, No apparent distress HEENT: Head is normocephalic, nasal cannula in place.  Neck: Supple without JVD or  lymphadenopathy Heart: Reg rate and rhythm. No murmurs rubs or gallops Chest: +tachypnea, satting at 89% Abdomen: Soft, non-tender, non-distended, bowel sounds positive. Extremities: No clubbing, cyanosis, or edema. Pulses are 2+ Skin: IV in arm with small bruise Neuro: Pt is cognitively appropriate with normal insight, memory, and awareness. Cranial nerves 2-12 are intact. Sensory exam is normal. Reflexes are 2+ in all 4's. Fine motor coordination is intact. No tremors. Motor function is grossly 5/5.  Musculoskeletal: Full ROM, No pain with AROM or PROM in the neck, trunk, or extremities. Posture appropriate Psych: Pt's affect is appropriate. Pt is cooperative   Results for orders placed or performed during the hospital encounter of 10/25/20 (from the past 48 hour(s))  Glucose, capillary     Status: Abnormal   Collection Time: 10/25/20  5:42 PM  Result Value Ref Range  Glucose-Capillary 112 (H) 70 - 99 mg/dL    Comment: Glucose reference range applies only to samples taken after fasting for at least 8 hours.   No results found.     Medical Problem List and Plan: 1.  COVID debility  -patient may shower  -ELOS/Goals: modI in 5-7 days 2.  Antithrombotics: -DVT/anticoagulation:  Pharmaceutical: Lovenox  -antiplatelet therapy: Aspirin 81mg  3. Pain Management: Tylenol as needed- denies pain.  4. Mood: LCSW to follow for evaluation and support  -antipsychotic agents: N/AA 5. Neuropsych: This patient is capable of making decisions on her own behalf. 6. Skin/Wound Care: Routine pressure-relief measures.  On vitamin C, multivitamin and zinc daily. May d/c IV 7. Fluids/Electrolytes/Nutrition: Monitor I's/O.  Encourage fluid intake.  Continue supplements to help promote healing 8.  COPD: Continue to encourage pulmonary hygiene with use of flutter valve.  On prednisone 40 mg p.o. per day.  Wean oxygen as tolerated to keep saturations around 88% or higher.  Continue Breo and Incruse  daily. 9.  HTN: Monitor blood pressures 3 times daily.  Continue amlodipine 10.  Steroid-induced hyperglycemia: Resolving with increase in activity.  Continue monitoring blood sugars AC at bedtime is still on steroids  with sliding scale insulin as needed 11.  Leukocytosis: Monitor for fevers or other signs of infection.  White count improving overall--likely steroid-induced but mildly elevated at baseline per records.   CRP/D-dimer on downward trend. 12.  History of depression: Continue Wellbutrin XL 13.  Insomnia: Continue trazodone at bedtime, which is helping.   Reesa Chew, PA-C  I have personally performed a face to face diagnostic evaluation, including, but not limited to relevant history and physical exam findings, of this patient and developed relevant assessment and plan.  Additionally, I have reviewed and concur with the physician assistant's documentation above.  Leeroy Cha, MD

## 2020-10-25 NOTE — Plan of Care (Signed)
  Problem: Clinical Measurements: Goal: Will remain free from infection Outcome: Progressing Goal: Respiratory complications will improve Outcome: Progressing   Problem: Activity: Goal: Risk for activity intolerance will decrease Outcome: Progressing   Problem: Coping: Goal: Level of anxiety will decrease Outcome: Progressing   Problem: Elimination: Goal: Will not experience complications related to urinary retention Outcome: Progressing   Problem: Pain Managment: Goal: General experience of comfort will improve Outcome: Progressing   Problem: Safety: Goal: Ability to remain free from injury will improve Outcome: Progressing   Problem: Skin Integrity: Goal: Risk for impaired skin integrity will decrease Outcome: Progressing

## 2020-10-25 NOTE — H&P (Addendum)
Physical Medicine and Rehabilitation Admission H&P    Chief Complaint  Patient presents with  .  Post Covid debility    HPI: Jean Shaffer is a 69 year old female with history of HTN, DDD/OA, chronic LBP with radiculopathy, COPD who was admitted on 09/30/2020 with cough and shortness of breath with hypoxia due to COVID-19.  She was treated with remdesivir, baricitinib and steroids with improvement in respiratory status however continued to require high flow supplemental oxygen.  Pulmonary hygiene recommended and patient on slow steroid taper.  Blood pressure stabilizing therefore amlodipine resumed. She continues to have hypoxia with activity requiring HF oxygen and limited by debility. CIR recommended due to functional decline. Denies constipation, pain. Lives with her children and grandchildren. Continue to desat significantly with movement. Has steroid-induced leukocytosis and insomnia, the latter has improved with sleep aides.    Review of Systems  Constitutional: Positive for malaise/fatigue. Negative for fever.  HENT: Negative.   Eyes: Negative.   Respiratory: Positive for shortness of breath. Negative for cough.   Cardiovascular: Negative.   Gastrointestinal: Negative.   Genitourinary: Negative.   Musculoskeletal: Negative for back pain and myalgias.  Skin: Negative.   Neurological: Positive for sensory change (LLE numbness/tingling X few months) and weakness.  Endo/Heme/Allergies: Negative.   Psychiatric/Behavioral: Negative.     Past Medical History:  Diagnosis Date  . Anemia   . 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 Asthma   . Chronic back pain   . COPD (chronic obstructive pulmonary disease) ([place])    per pt dx in ~2014  . Heart murmur    as a child per pt  . Hypertension   . Hypothyroidism   . Stroke [facility])    per pt "maybe last year or the year before last, they told me I might've had a TIA, but I blacked out"  . Thyroid disease      Past Surgical History:  Procedure Laterality Date  . ABDOMINAL HYSTERECTOMY    . CATARACT EXTRACTION W/ INTRAOCULAR LENS IMPLANT Left 07/14/2014  . CATARACT EXTRACTION W/ INTRAOCULAR LENS IMPLANT Right 10/18/2015  . COLONOSCOPY    . EYE SURGERY    . IR RADIOLOGIST EVAL & MGMT  01/08/2020  . LAPAROSCOPIC APPENDECTOMY N/A 01/29/2020   Procedure: LAPAROSCOPIC  APPENDECTOMY;  Surgeon: Rolm Bookbinder, MD;  Location: [facility];  Service: General;  Laterality: N/A;  . MULTIPLE TOOTH EXTRACTIONS  2002  . NECK SURGERY    . SPINE SURGERY    . WISDOM TOOTH EXTRACTION     per pt 01/21/20    Family History  Problem Relation Age of Onset  . Alcohol abuse Father   . Cancer Father   . Heart disease Father   . Cancer Mother   . Cancer Maternal Grandfather   . Heart disease Maternal Grandfather   . Heart disease Paternal Grandmother   . Diabetes Paternal Grandmother   . Diabetes Paternal Grandfather     Social History:  reports that she has quit smoking--smoked 1/2 to 2 PPD. She has never used smokeless tobacco. She reports that she does not drink alcohol and does not use drugs.    Allergies: No Known Allergies    Medications Prior to Admission  Medication Sig Dispense Refill  . acetaminophen (TYLENOL) 325 MG tablet Take 2 tablets (650 mg total) by mouth every 6 (six) hours as needed for mild pain (or temp > 100).    Marland Kitchen albuterol (PROVENTIL HFA;VENTOLIN HFA)  108 (90 Base) MCG/ACT inhaler Inhale 2 puffs into the lungs every 4 (four) hours as needed for shortness of breath or wheezing.    Marland Kitchen amLODipine (NORVASC) 2.5 MG tablet Take 2.5 mg by mouth daily.    Marland Kitchen aspirin EC 81 MG tablet Take 81 mg by mouth daily.    Marland Kitchen buPROPion (WELLBUTRIN XL) 150 MG 24 hr tablet Take 150 mg by mouth daily.     Marland Kitchen docusate sodium (COLACE) 100 MG capsule Take 1 capsule (100 mg total) by mouth 2 (two) times daily. (Patient taking differently: Take 100 mg by mouth daily as needed for mild constipation. ) 10 capsule 0   . fluticasone furoate-vilanterol (BREO ELLIPTA) 100-25 MCG/INH AEPB Inhale 1 puff into the lungs daily.     Marland Kitchen HYDROcodone-acetaminophen (NORCO/VICODIN) 5-325 MG tablet Take 1 tablet by mouth daily as needed for moderate pain.     Marland Kitchen ibuprofen (ADVIL,MOTRIN) 200 MG tablet Take 400 mg by mouth every 6 (six) hours as needed for moderate pain.     Marland Kitchen levothyroxine (SYNTHROID, LEVOTHROID) 50 MCG tablet Take 50 mcg by mouth daily before breakfast.     . meloxicam (MOBIC) 15 MG tablet Take 15 mg by mouth daily.    Marland Kitchen oxyCODONE (OXY IR/ROXICODONE) 5 MG immediate release tablet Take 1 tablet (5 mg total) by mouth every 6 (six) hours as needed for severe pain. 20 tablet 0  . polyethylene glycol (MIRALAX / GLYCOLAX) 17 g packet Take 17 g by mouth daily. (Patient taking differently: Take 17 g by mouth daily as needed for mild constipation. ) 14 each 0  . tiotropium (SPIRIVA HANDIHALER) 18 MCG inhalation capsule Take 18 mcg by mouth daily.     . traZODone (DESYREL) 100 MG tablet Take 100 mg by mouth at bedtime as needed for sleep.     . valACYclovir (VALTREX) 500 MG tablet Take 500 mg by mouth See admin instructions. Take 1 tablet by mouth twice daily for 3 days during outbreak    . oxyCODONE (OXY IR/ROXICODONE) 5 MG immediate release tablet Take 1 tablet (5 mg total) by mouth every 6 (six) hours as needed for moderate pain, severe pain or breakthrough pain. (Patient not taking: Reported on 09/30/2020) 12 tablet 0  . oxyCODONE (OXY IR/ROXICODONE) 5 MG immediate release tablet Take 1 tablet (5 mg total) by mouth every 4 (four) hours as needed for moderate pain. (Patient not taking: Reported on 09/30/2020) 10 tablet 0  . senna-docusate (SENOKOT-S) 8.6-50 MG tablet Take 1 tablet by mouth daily. (Patient not taking: Reported on 09/30/2020)      Drug Regimen Review  Drug regimen was reviewed and remains appropriate with no significant issues identified  Home: Home Living Family/patient expects to be discharged to::  Private residence Living Arrangements: Children, Other relatives Available Help at Discharge: Family Type of Home: House Home Access: Stairs to enter Technical brewer of Steps: 1 Home Layout: One level Bathroom Shower/Tub: Chiropodist: Standard Home Equipment: [employer] - single point Additional Comments: Reports usinga cane PRN secondary to "back problems"   Functional History: Prior Function Level of Independence: Independent  Functional Status:  Mobility: Bed Mobility Overal bed mobility: Independent Bed Mobility: Supine to Sit Sidelying to sit: Supervision Supine to sit: Modified independent (Device/Increase time) Sit to supine: Supervision Sit to sidelying: Supervision General bed mobility comments: Pt OOB in recliner Transfers Overall transfer level: Needs assistance Equipment used: Rolling walker (2 wheeled) Transfers: Sit to/from Stand Sit to Stand: Supervision Stand pivot transfers:  Min guard General transfer comment: stands easily from recliner Ambulation/Gait Ambulation/Gait assistance: Supervision Gait Distance (Feet): 85 Feet Assistive device: Rolling walker (2 wheeled) Gait Pattern/deviations: Step-through pattern, Decreased stride length General Gait Details: ambulated on ^ L HFNC, gait steady using Rw Gait velocity: decreased    ADL: ADL Overall ADL's : Needs assistance/impaired Eating/Feeding: Set up, Sitting Eating/Feeding Details (indicate cue type and reason): required set up to open food containers. patient seated in recliner. Grooming: Sitting, Wash/dry face, Wash/dry hands Grooming Details (indicate cue type and reason): washed face and hands seated on toilet in bathroom due to fatigue from walk to bathroom. Upper Body Bathing: Set up Lower Body Bathing: Set up, Sitting/lateral leans Upper Body Dressing : Set up, Sitting Lower Body Dressing: Set up, Sit to/from stand Lower Body Dressing Details (indicate cue type and  reason): predominantly sitting Toilet Transfer: Supervision/safety, Ambulation, RW Toilet Transfer Details (indicate cue type and reason): Ambulated to bathroom with min assist for steadying on 15 L HFNC and 15 L NRB. Fatigued quickly and o2 sat dropped to 72%. Toileting- Clothing Manipulation and Hygiene: Sitting/lateral lean, Min guard Toileting - Clothing Manipulation Details (indicate cue type and reason): Patient able to manage clothing and performed pericare seated on toilet with side leands. min guard provided. Functional mobility during ADLs: Min guard General ADL Comments: today's session focusing on maximizing endurance needed for participation in self care and weaning O2 in order to facilitate D/C home, patient is motivated "I want to do as much as I can while you're here" wean patient to 10L with functional ambulation with desat to 80 after ~80ft, recovers within 30 seconds of seated rest. performed this x3  Cognition: Cognition Overall Cognitive Status: Within Functional Limits for tasks assessed Orientation Level: Oriented X4 Cognition Arousal/Alertness: Awake/alert Behavior During Therapy: WFL for tasks assessed/performed Overall Cognitive Status: Within Functional Limits for tasks assessed General Comments: less anxious when used NRB and less SOB   Blood pressure (!) 144/71, pulse 87, temperature 98.2 F (36.8 C), temperature source Oral, resp. rate (!) 22, height 5\' 4"  (1.626 m), weight 63.4 kg, SpO2 (!) 89 %. Physical Exam  General: Alert and oriented x 3, No apparent distress HEENT: Head is normocephalic, nasal cannula in place.  Neck: Supple without JVD or lymphadenopathy Heart: Reg rate and rhythm. No murmurs rubs or gallops Chest: +tachypnea, satting at 89% Abdomen: Soft, non-tender, non-distended, bowel sounds positive. Extremities: No clubbing, cyanosis, or edema. Pulses are 2+ Skin: IV in arm with small bruise Neuro: Pt is cognitively appropriate with normal  insight, memory, and awareness. Cranial nerves 2-12 are intact. Sensory exam is normal. Reflexes are 2+ in all 4's. Fine motor coordination is intact. No tremors. Motor function is grossly 5/5.  Musculoskeletal: Full ROM, No pain with AROM or PROM in the neck, trunk, or extremities. Posture appropriate Psych: Pt's affect is appropriate. Pt is cooperative   Results for orders placed or performed during the hospital encounter of 09/30/20 (from the past 48 hour(s))  Glucose, capillary     Status: None   Collection Time: 10/23/20 11:34 AM  Result Value Ref Range   Glucose-Capillary 87 70 - 99 mg/dL    Comment: Glucose reference range applies only to samples taken after fasting for at least 8 hours.  Glucose, capillary     Status: Abnormal   Collection Time: 10/23/20  4:53 PM  Result Value Ref Range   Glucose-Capillary 237 (H) 70 - 99 mg/dL    Comment: Glucose reference range applies  only to samples taken after fasting for at least 8 hours.  Glucose, capillary     Status: Abnormal   Collection Time: 10/23/20  8:17 PM  Result Value Ref Range   Glucose-Capillary 122 (H) 70 - 99 mg/dL    Comment: Glucose reference range applies only to samples taken after fasting for at least 8 hours.  Glucose, capillary     Status: None   Collection Time: 10/24/20  7:37 AM  Result Value Ref Range   Glucose-Capillary 78 70 - 99 mg/dL    Comment: Glucose reference range applies only to samples taken after fasting for at least 8 hours.  Glucose, capillary     Status: Abnormal   Collection Time: 10/24/20 11:49 AM  Result Value Ref Range   Glucose-Capillary 104 (H) 70 - 99 mg/dL    Comment: Glucose reference range applies only to samples taken after fasting for at least 8 hours.  Glucose, capillary     Status: Abnormal   Collection Time: 10/24/20  4:46 PM  Result Value Ref Range   Glucose-Capillary 111 (H) 70 - 99 mg/dL    Comment: Glucose reference range applies only to samples taken after fasting for at  least 8 hours.  Glucose, capillary     Status: Abnormal   Collection Time: 10/24/20  8:18 PM  Result Value Ref Range   Glucose-Capillary 136 (H) 70 - 99 mg/dL    Comment: Glucose reference range applies only to samples taken after fasting for at least 8 hours.  Glucose, capillary     Status: None   Collection Time: 10/25/20  7:37 AM  Result Value Ref Range   Glucose-Capillary 73 70 - 99 mg/dL    Comment: Glucose reference range applies only to samples taken after fasting for at least 8 hours.   No results found.     Medical Problem List and Plan: 1.  COVID debility  -patient may shower  -ELOS/Goals: modI in 5-7 days 2.  Antithrombotics: -DVT/anticoagulation:  Pharmaceutical: Lovenox  -antiplatelet therapy: Aspirin 81mg  3. Pain Management: Tylenol as needed- denies pain.  4. Mood: LCSW to follow for evaluation and support  -antipsychotic agents: N/AA 5. Neuropsych: This patient is capable of making decisions on her own behalf. 6. Skin/Wound Care: Routine pressure-relief measures.  On vitamin C, multivitamin and zinc daily. May d/c IV 7. Fluids/Electrolytes/Nutrition: Monitor I's/O.  Encourage fluid intake.  Continue supplements to help promote healing 8.  COPD: Continue to encourage pulmonary hygiene with use of flutter valve.  On prednisone 40 mg p.o. per day.  Wean oxygen as tolerated to keep saturations around 88% or higher.  Continue Breo and Incruse daily. 9.  HTN: Monitor blood pressures 3 times daily.  Continue amlodipine 10.  Steroid-induced hyperglycemia: Resolving with increase in activity.  Continue monitoring blood sugars AC at bedtime is still on steroids  with sliding scale insulin as needed 11.  Leukocytosis: Monitor for fevers or other signs of infection.  White count improving overall--likely steroid-induced but mildly elevated at baseline per records.   CRP/D-dimer on downward trend. 12.  History of depression: Continue Wellbutrin XL 13.  Insomnia: Continue  trazodone at bedtime, which is helping.   BRITTAY MOGLE, PA-C 10/25/2020   I have personally performed a face to face diagnostic evaluation, including, but not limited to relevant history and physical exam findings, of this patient and developed relevant assessment and plan.  Additionally, I have reviewed and concur with the physician assistant's documentation above.  Leeroy Cha, MD

## 2020-10-25 NOTE — Progress Notes (Signed)
Inpatient Rehabilitation Medication Review by a Pharmacist  A complete drug regimen review was completed for this patient to identify any potential clinically significant medication issues.  Clinically significant medication issues were identified:  Yes   Type of Medication Issue Identified Description of Issue Urgent (address now) Non-Urgent (address on AM team rounds) Plan Plan Accepted by Provider? (Yes / No / Pending AM Rounds)  Drug Interaction(s) (clinically significant)       Duplicate Therapy  Duplicate PRNs for nausea (Zofran, Compazine) Duplicate PRN orders for Robitussin DM Non urgent Contacted provider to limit to only 1 PRN each for nausea and cough (also has Tussionex PRN for cough, but has instructions to use if Robitussin DM ineffective) Yes  Allergy       No Medication Administration End Date       Incorrect Dose       Additional Drug Therapy Needed       Other  Pt had order for Valtrex PRN for outbreaks on med list P[facility] to [facility]; med was not ordered at [facility]; pt not having outbreak at this time Non urgent Inform provider of this PRN order (if pt has outbreak at [facility]) Provider aware    Name of provider notified for non-urgent issues identified: Terrianna Holsclaw, PA-C   Time spent performing this drug regimen review (minutes):  [place], PharmD, BCPS, [facility] Clinical Pharmacist 10/25/2020 3:03 PM

## 2020-10-25 NOTE — Progress Notes (Signed)
PMR Admission Coordinator Pre-Admission Assessment   Patient: Jean Shaffer is an 69 y.o., female MRN: [id number] DOB: [birth year] Height: 5\' 4"  (162.6 cm) Weight: 63.4 kg   Insurance Information HMO:     PPO:      PCP:      IPA:      80/20:      OTHER:  PRIMARY: Medicare A and B      Policy#: [id number]      Subscriber: pt CM Name:       Phone#:      Fax#:  Pre-Cert#:       Employer:  Benefits:  Phone #:      Name:  Eff. Date: 11/25/15     Deduct: $1484      Out of Pocket Max: n/a      Life Max: n/a CIR: 100%      SNF: 20 full days Outpatient: 80%     Co-Pay: 20% Home Health: 100%      Co-Pay:  DME: 80%     Co-Pay: 20% Providers: pt choice SECONDARY: Medicaid ([employer])      Policy#: [id number] s     Phone#:    Financial Counselor:       Phone#:    The "Data Collection Information Summary" for patients in Inpatient Rehabilitation Facilities with attached "Privacy Act [place] Records" was provided and verbally reviewed with: Patient   Emergency Contact Information         Contact Information     Name Relation Home Work Mobile    [place] [phone]   [phone]         Current Medical History  Patient Admitting Diagnosis: post covid debility   History of Present Illness: Pt is a 69 y/o female with PMH of asthma, COPD, HTN, hypothyroidism, and chronic back pain admitted to [facility] on 10/7. She was found to be hypoxic at the infusion center prior to receiving monoclonal antibody therapy.  Pt has received remdesivir, baricitinib, and steroids and has stabilized, but continues to require significant amounts of supplemental O2 (5-8L).  Therapy evaluations were completed and pt was recommended for CIR to advance mobility, progress cardiopulmonary endurance, and reduce supplemental O2 need.    Patient's medical record from [facility] has been reviewed by the rehabilitation admission coordinator and physician.   Past Medical History      Past Medical  History:  Diagnosis Date  . Anemia    . Arthritis      osteoarthritis in knees and hands ~2016 dx  . Asthma    . Chronic back pain    . COPD (chronic obstructive pulmonary disease) ([place])      per pt dx in ~2014  . Heart murmur      as a child per pt  . Hypertension    . Hypothyroidism    . Stroke [facility])      per pt "maybe last year or the year before last, they told me I might've had a TIA, but I blacked out"  . Thyroid disease        Family History   family history includes Alcohol abuse in her father; Cancer in her father, maternal grandfather, and mother; Diabetes in her paternal grandfather and paternal grandmother; Heart disease in her father, maternal grandfather, and paternal grandmother.   Prior Rehab/Hospitalizations Has the patient had prior rehab or hospitalizations prior to admission? No   Has the patient had major surgery during 100 days prior to admission?  No               Current Medications   Current Facility-Administered Medications:  .  acetaminophen (TYLENOL) tablet 650 mg, 650 mg, Oral, Q6H PRN, Lavina Hamman, MD, 650 mg at 10/10/20 2056 .  amLODipine (NORVASC) tablet 2.5 mg, 2.5 mg, Oral, Daily, Georgette Shell, MD, 2.5 mg at 10/25/20 0933 .  ascorbic acid (VITAMIN C) tablet 500 mg, 500 mg, Oral, Daily, Lavina Hamman, MD, 500 mg at 10/25/20 0934 .  aspirin EC tablet 81 mg, 81 mg, Oral, Daily, Lavina Hamman, MD, 81 mg at 10/25/20 0930 .  bisacodyl (DULCOLAX) EC tablet 10 mg, 10 mg, Oral, Daily, Georgette Shell, MD, 10 mg at 10/25/20 0931 .  buPROPion (WELLBUTRIN XL) 24 hr tablet 150 mg, 150 mg, Oral, Daily, Lavina Hamman, MD, 150 mg at 10/25/20 0932 .  calcium carbonate (TUMS - dosed in mg elemental calcium) chewable tablet 200 mg of elemental calcium, 1 tablet, Oral, BID PRN, Vance Gather B, MD, 200 mg of elemental calcium at 10/21/20 1605 .  chlorpheniramine-HYDROcodone (TUSSIONEX) 10-8 MG/5ML suspension 5 mL, 5 mL, Oral, Q12H PRN, Lavina Hamman, MD, 5 mL at 10/06/20 2017 .  docusate sodium (COLACE) capsule 100 mg, 100 mg, Oral, BID, Lavina Hamman, MD, 100 mg at 10/25/20 0933 .  enoxaparin (LOVENOX) injection 40 mg, 40 mg, Subcutaneous, Q24H, Lavina Hamman, MD, 40 mg at 10/24/20 2107 .  feeding supplement (ENSURE ENLIVE) (ENSURE ENLIVE) liquid 237 mL, 237 mL, Oral, BID BM, Elodia Florence., MD, 237 mL at 10/25/20 0939 .  fluticasone furoate-vilanterol (BREO ELLIPTA) 100-25 MCG/INH 1 puff, 1 puff, Inhalation, Daily, Lavina Hamman, MD, 1 puff at 10/25/20 0736 .  guaiFENesin-dextromethorphan (ROBITUSSIN DM) 100-10 MG/5ML syrup 10 mL, 10 mL, Oral, Q4H PRN, Lavina Hamman, MD, 10 mL at 10/11/20 2110 .  HYDROcodone-acetaminophen (NORCO/VICODIN) 5-325 MG per tablet 1 tablet, 1 tablet, Oral, Daily PRN, Lavina Hamman, MD, 1 tablet at 10/07/20 0539 .  hydrOXYzine (ATARAX/VISTARIL) tablet 25 mg, 25 mg, Oral, TID PRN, Elodia Florence., MD, 25 mg at 10/24/20 2107 .  insulin aspart (novoLOG) injection 0-9 Units, 0-9 Units, Subcutaneous, TID WC, Elodia Florence., MD, 3 Units at 10/23/20 1730 .  levothyroxine (SYNTHROID) tablet 50 mcg, 50 mcg, Oral, QAC breakfast, Lavina Hamman, MD, 50 mcg at 10/25/20 0601 .  lip balm (CARMEX) ointment, , Topical, PRN, Elodia Florence., MD .  magnesium hydroxide (MILK OF MAGNESIA) suspension 15 mL, 15 mL, Oral, Daily PRN, Patrecia Pour, MD, 15 mL at 10/21/20 1605 .  multivitamin liquid 15 mL, 15 mL, Oral, Daily, Georgette Shell, MD, 15 mL at 10/25/20 0930 .  ondansetron (ZOFRAN) tablet 4 mg, 4 mg, Oral, Q6H PRN **OR** ondansetron (ZOFRAN) injection 4 mg, 4 mg, Intravenous, Q6H PRN, Lavina Hamman, MD .  pantoprazole (PROTONIX) EC tablet 40 mg, 40 mg, Oral, Daily, Patrecia Pour, MD, 40 mg at 10/25/20 0932 .  polyethylene glycol (MIRALAX / GLYCOLAX) packet 17 g, 17 g, Oral, Daily, Lavina Hamman, MD, 17 g at 10/24/20 0957 .  predniSONE (DELTASONE) tablet 40 mg, 40 mg, Oral, Q  breakfast, Patrecia Pour, MD, 40 mg at 10/25/20 0742 .  senna (SENOKOT) tablet 8.6 mg, 1 tablet, Oral, Daily, Vance Gather B, MD, 8.6 mg at 10/25/20 0932 .  traZODone (DESYREL) tablet 100 mg, 100 mg, Oral, QHS, Georgette Shell, MD, 100 mg  at 10/24/20 2107 .  umeclidinium bromide (INCRUSE ELLIPTA) 62.5 MCG/INH 1 puff, 1 puff, Inhalation, Daily, Lavina Hamman, MD, 1 puff at 10/25/20 0736 .  zinc sulfate capsule 220 mg, 220 mg, Oral, Daily, Lavina Hamman, MD, 220 mg at 10/25/20 0973   Patients Current Diet:     Diet Order                      Diet regular Room service appropriate? Yes; Fluid consistency: Thin  Diet effective now                      Precautions / Restrictions Precautions Precautions: Fall Precaution Comments: monitor sats, on HFNC Restrictions Weight Bearing Restrictions: No    Has the patient had 2 or more falls or a fall with injury in the past year? No   Prior Activity Level Limited Community (1-2x/wk): independent prior to admit, no DME used   Prior Functional Level Self Care: Did the patient need help bathing, dressing, using the toilet or eating? Independent   Indoor Mobility: Did the patient need assistance with walking from room to room (with or without device)? Independent   Stairs: Did the patient need assistance with internal or external stairs (with or without device)? Independent   Functional Cognition: Did the patient need help planning regular tasks such as shopping or remembering to take medications? Independent   Home Assistive Devices / Equipment Home Assistive Devices/Equipment: Eyeglasses, Radio producer (specify quad or straight) Home Equipment: Cane - single point   Prior Device Use: Indicate devices/aids used by the patient prior to current illness, exacerbation or injury? None of the above   Current Functional Level Cognition   Overall Cognitive Status: Within Functional Limits for tasks assessed Orientation Level: Oriented  X4 General Comments: less anxious when used NRB and less SOB    Extremity Assessment (includes Sensation/Coordination)   Upper Extremity Assessment: Generalized weakness  Lower Extremity Assessment: Generalized weakness     ADLs   Overall ADL's : Needs assistance/impaired Eating/Feeding: Set up, Sitting Eating/Feeding Details (indicate cue type and reason): required set up to open food containers. patient seated in recliner. Grooming: Sitting, Wash/dry face, Wash/dry hands Grooming Details (indicate cue type and reason): washed face and hands seated on toilet in bathroom due to fatigue from walk to bathroom. Upper Body Bathing: Set up Lower Body Bathing: Set up, Sitting/lateral leans Upper Body Dressing : Set up, Sitting Lower Body Dressing: Set up, Sit to/from stand Lower Body Dressing Details (indicate cue type and reason): predominantly sitting Toilet Transfer: Supervision/safety, Ambulation, RW Toilet Transfer Details (indicate cue type and reason): Ambulated to bathroom with min assist for steadying on 15 L HFNC and 15 L NRB. Fatigued quickly and o2 sat dropped to 72%. Toileting- Clothing Manipulation and Hygiene: Sitting/lateral lean, Min guard Toileting - Clothing Manipulation Details (indicate cue type and reason): Patient able to manage clothing and performed pericare seated on toilet with side leands. min guard provided. Functional mobility during ADLs: Min guard General ADL Comments: today's session focusing on maximizing endurance needed for participation in self care and weaning O2 in order to facilitate D/C home, patient is motivated "I want to do as much as I can while you're here" wean patient to 10L with functional ambulation with desat to 80 after ~6ft, recovers within 30 seconds of seated rest. performed this x3     Mobility   Overal bed mobility: Independent Bed Mobility: Supine to Sit Sidelying to  sit: Supervision Supine to sit: Modified independent  (Device/Increase time) Sit to supine: Supervision Sit to sidelying: Supervision General bed mobility comments: Pt OOB in recliner     Transfers   Overall transfer level: Needs assistance Equipment used: Rolling walker (2 wheeled) Transfers: Sit to/from Stand Sit to Stand: Supervision Stand pivot transfers: Min guard General transfer comment: stands easily from recliner     Ambulation / Gait / Stairs / Emergency planning/management officer   Ambulation/Gait Ambulation/Gait assistance: Scientist, forensic (Feet): 85 Feet Assistive device: Rolling walker (2 wheeled) Gait Pattern/deviations: Step-through pattern, Decreased stride length General Gait Details: ambulated on ^ L HFNC, gait steady using Rw Gait velocity: decreased     Posture / Balance Balance Overall balance assessment: Needs assistance Sitting-balance support: Feet supported Sitting balance-Leahy Scale: Good Standing balance support: No upper extremity supported Standing balance-Leahy Scale: Fair Standing balance comment: statically patient able to stand without UE support, utilizes walker with ambulation "sometimes I'm wobbly"     Special needs/care consideration Oxygen 5-8 L via nasal cannula    Previous Home Environment (from acute therapy documentation) Living Arrangements: Children, Other relatives Available Help at Discharge: Family Type of Home: House Home Layout: One level Home Access: Stairs to enter Technical brewer of Steps: 1 Bathroom Shower/Tub: Chiropodist: [place]: No Additional Comments: Reports usinga cane PRN secondary to "back problems"   Discharge Living Setting Plans for Discharge Living Setting: Patient's home, Lives with (comment) (son and daughter in law) Type of Home at Discharge: House Discharge Home Layout: One level Discharge Home Access: Stairs to enter Entrance Stairs-Rails: None Entrance Stairs-Number of Steps: 1 Discharge Bathroom  Shower/Tub: Tub/shower unit Discharge Bathroom Toilet: Standard Discharge Bathroom Accessibility: Yes How Accessible: Accessible via walker Does the patient have any problems obtaining your medications?: No   Social/Family/Support Systems Anticipated Caregiver: mod I goals, contact Arloa Koh (son) Anticipated Caregiver's Contact Information: [phone] Ability/Limitations of Caregiver: works during the day Caregiver Availability: Evenings only Discharge Plan Discussed with Primary Caregiver: Yes Is Caregiver In Agreement with Plan?: Yes Does Caregiver/Family have Issues with Lodging/Transportation while Pt is in Rehab?: No   Goals Patient/Family Goal for Rehab: PT/OT mod I on 3L or less, SLP n/a  Expected length of stay: 9-11 days Pt/Family Agrees to Admission and willing to participate: Yes Program Orientation Provided & Reviewed with Pt/Caregiver Including Roles  & Responsibilities: Yes  Barriers to Discharge: Decreased caregiver support   Decrease burden of Care through IP rehab admission: n/a   Possible need for SNF placement upon discharge: No   Patient Condition: I have reviewed medical records from [facility], spoken with CM, and patient. I discussed via phone for inpatient rehabilitation assessment.  Patient will benefit from ongoing PT and OT, can actively participate in 3 hours of therapy a day 5 days of the week, and can make measurable gains during the admission.  Patient will also benefit from the coordinated team approach during an Inpatient Acute Rehabilitation admission.  The patient will receive intensive therapy as well as Rehabilitation physician, nursing, social worker, and care management interventions.  Due to safety, disease management, medication administration, pain management and patient education the patient requires 24 hour a day rehabilitation nursing.  The patient is currently supervision to min guard with mobility and basic ADLs.  Discharge setting and  therapy post discharge at home with outpatient is anticipated.  Patient has agreed to participate in the Acute Inpatient Rehabilitation Program and will admit today.   Preadmission  Screen Completed By:  Michel Santee, PT, DPT 10/25/2020 11:18 AM ______________________________________________________________________   Discussed status with Dr. Ranell Patrick on 10/25/20  at 11:18 AM  and received approval for admission today.   Admission Coordinator:  Michel Santee, PT, DPT, time 11:18 AM Sudie Grumbling 10/25/20     Assessment/Plan: Diagnosis: COVID Debility 1. Does the need for close, 24 hr/day Medical supervision in concert with the patient's rehab needs make it unreasonable for this patient to be served in a less intensive setting? Yes 2. Co-Morbidities requiring supervision/potential complications: acute respiratory failure with hypoxia, shortness of breath, COPD, HTN, syncope, palpitations, steroid-induced insomnia, steroid-induced leukocytosis, impaired endurance 3. Due to bladder management, bowel management, safety, skin/wound care, disease management, medication administration, pain management and patient education, does the patient require 24 hr/day rehab nursing? Yes 4. Does the patient require coordinated care of a physician, rehab nurse, PT, OT, and SLP to address physical and functional deficits in the context of the above medical diagnosis(es)? Yes Addressing deficits in the following areas: balance, endurance, locomotion, strength, transferring, bowel/bladder control, bathing, dressing, feeding, grooming, toileting and psychosocial support 5. Can the patient actively participate in an intensive therapy program of at least 3 hrs of therapy 5 days a week? Yes 6. The potential for patient to make measurable gains while on inpatient rehab is excellent 7. Anticipated functional outcomes upon discharge from inpatient rehab: modified independent PT, modified independent OT, independent  SLP 8. Estimated rehab length of stay to reach the above functional goals is: 5-7 days 9. Anticipated discharge destination: Home 10. Overall Rehab/Functional Prognosis: excellent     MD Signature: Leeroy Cha, MD

## 2020-10-25 NOTE — Discharge Summary (Signed)
Physician Discharge Summary  Patient ID: Jean Shaffer MRN: [id number] DOB/AGE: 1951/09/16 69 y.o.  Admit date: 09/30/2020 Discharge date: 10/25/2020  Admission Diagnoses:  Discharge Diagnoses:  Active Problems:   COVID-19   Acute respiratory failure with hypoxia (HCC)   Shortness of breath   Discharged Condition: stable  Hospital Course: Patient is a 69 year old Caucasian female with past medical history significant for asthma, COPD, hypertension, hypothyroidism, and chronic back pain.  Patient was admitted on 09/30/2020 for covid-19 pneumonia, after having been found hypoxic at the infusion center prior to receiving monoclonal antibody.  Patient was admitted and managed with remdesivir, baricitinib, and steroids.  Patient improved significantly, but remained dependent on high flow supplemental oxygen.  Patient has been optimized.  PT OT consult was called.  CIR placement was recommended.  Patient will be discharged to CIR today.  Acute hypoxemic respiratory failure due to covid-19 pneumonia:  - Wean oxygen down to 3 L/min today.   - s/p remdesivir, baricitinib.  -Patient is still on steroids.  Taper steroids as tolerated.    COPD:  Stable.   LFT elevation:  Resolved  Hypertension: - Controlled.  Depression: -Stable.  - Continue bupropion  Chronic back pain:  - Continue hydrocodone and frequent mobility -Controlled  Constipation:  - Continue scheduled bowel regimen and add milk of magnesia which helped in the past.   GERD:  -Continue PPI -Continue TUMS prn  Hypothyroidism:  - Continue synthroid.   Insomnia:  - Seems to be improving with tapering steroids  Consults: None  Significant Diagnostic Studies:   Discharge Exam: Blood pressure (!) 144/71, pulse 87, temperature 98.2 F (36.8 C), temperature source Oral, resp. rate (!) 22, height 5\' 4"  (1.626 m), weight 63.4 kg, SpO2 (!) 89 %.   Disposition: Discharge disposition: 02-Transferred to [facility]   Discharge Instructions    Diet - low sodium heart healthy   Complete by: As directed    Increase activity slowly   Complete by: As directed      Allergies as of 10/25/2020   No Known Allergies     Medication List    STOP taking these medications   albuterol 108 (90 Base) MCG/ACT inhaler Commonly known as: VENTOLIN HFA   ibuprofen 200 MG tablet Commonly known as: ADVIL   meloxicam 15 MG tablet Commonly known as: MOBIC   oxyCODONE 5 MG immediate release tablet Commonly known as: Oxy IR/ROXICODONE   senna-docusate 8.6-50 MG tablet Commonly known as: Senokot-S   Spiriva HandiHaler 18 MCG inhalation capsule Generic drug: tiotropium     TAKE these medications   acetaminophen 325 MG tablet Commonly known as: TYLENOL Take 2 tablets (650 mg total) by mouth every 6 (six) hours as needed for mild pain (or temp > 100).   amLODipine 2.5 MG tablet Commonly known as: NORVASC Take 2.5 mg by mouth daily.   ascorbic acid 500 MG tablet Commonly known as: VITAMIN C Take 1 tablet (500 mg total) by mouth daily. Start taking on: October 26, 2020   aspirin EC 81 MG tablet Take 81 mg by mouth daily.   bisacodyl 5 MG EC tablet Commonly known as: DULCOLAX Take 2 tablets (10 mg total) by mouth daily. Start taking on: October 26, 2020   Breo Ellipta 100-25 MCG/INH Aepb Generic drug: fluticasone furoate-vilanterol Inhale 1 puff into the lungs daily.   buPROPion 150 MG 24 hr tablet Commonly known as: WELLBUTRIN XL Take 150 mg by mouth daily.   calcium carbonate 500 MG  chewable tablet Commonly known as: TUMS - dosed in mg elemental calcium Chew 1 tablet (200 mg of elemental calcium total) by mouth 2 (two) times daily as needed for indigestion or heartburn.   docusate sodium 100 MG capsule Commonly known as: COLACE Take 1 capsule (100 mg total) by mouth 2 (two) times daily. What changed:   when to take this  reasons to take this   feeding supplement  Liqd Take 237 mLs by mouth 2 (two) times daily between meals.   HYDROcodone-acetaminophen 5-325 MG tablet Commonly known as: NORCO/VICODIN Take 1 tablet by mouth daily as needed for moderate pain.   insulin aspart 100 UNIT/ML injection Commonly known as: novoLOG Inject 0-9 Units into the skin 3 (three) times daily with meals.   levothyroxine 50 MCG tablet Commonly known as: SYNTHROID Take 50 mcg by mouth daily before breakfast.   lip balm ointment Apply topically as needed for lip care.   multivitamin Liqd Take 15 mLs by mouth daily. Start taking on: October 26, 2020   pantoprazole 40 MG tablet Commonly known as: PROTONIX Take 1 tablet (40 mg total) by mouth daily. Start taking on: October 26, 2020   polyethylene glycol 17 g packet Commonly known as: MIRALAX / GLYCOLAX Take 17 g by mouth daily. What changed:   when to take this  reasons to take this   predniSONE 20 MG tablet Commonly known as: DELTASONE Take 2 tablets (40 mg total) by mouth daily with breakfast. Start taking on: October 26, 2020   senna 8.6 MG Tabs tablet Commonly known as: SENOKOT Take 1 tablet (8.6 mg total) by mouth daily. Start taking on: October 26, 2020   traZODone 100 MG tablet Commonly known as: DESYREL Take 100 mg by mouth at bedtime as needed for sleep.   umeclidinium bromide 62.5 MCG/INH Aepb Commonly known as: INCRUSE ELLIPTA Inhale 1 puff into the lungs daily. Start taking on: October 26, 2020   valACYclovir 500 MG tablet Commonly known as: VALTREX Take 500 mg by mouth See admin instructions. Take 1 tablet by mouth twice daily for 3 days during outbreak   zinc sulfate 220 (50 Zn) MG capsule Take 1 capsule (220 mg total) by mouth daily. Start taking on: October 26, 2020       Follow-up Information    [facility] Follow up.   Specialty: [place] Why: This is the agency that will be providing services in your home Contact information: [address] [place] [address] [phone]               Signed: Bonnell Public 10/25/2020, 12:39 PM

## 2020-10-25 NOTE — Progress Notes (Addendum)
Inpatient Rehab Admissions Coordinator:   I have a bed available for pt to admit to CIR today.  Awaiting final confirmation from Dr. Marthenia Rolling, once he has a chance to evaluate patient this morning.  TOC aware.  Will let pt know once confirmed.   Addendum: Dr. Marthenia Rolling in agreement for admit to CIR today.  Will let pt know.   Shann Medal, PT, DPT Admissions Coordinator [phone] 10/25/20  10:06 AM

## 2020-10-25 NOTE — Progress Notes (Signed)
Patient arrived to unit via ambulance from [facility]. Patient presented on 2L O2 [place]. Patient in good spirits and ready to start therapy. Sanda Linger, LPN

## 2020-10-26 ENCOUNTER — Inpatient Hospital Stay (HOSPITAL_COMMUNITY): Payer: Medicare Other | Admitting: Physical Therapy

## 2020-10-26 ENCOUNTER — Inpatient Hospital Stay (HOSPITAL_COMMUNITY): Payer: Medicare Other

## 2020-10-26 ENCOUNTER — Inpatient Hospital Stay (HOSPITAL_COMMUNITY): Payer: Medicare Other | Admitting: Occupational Therapy

## 2020-10-26 DIAGNOSIS — R5381 Other malaise: Principal | ICD-10-CM

## 2020-10-26 LAB — CBC WITH DIFFERENTIAL/PLATELET
Abs Immature Granulocytes: 0.18 10*3/uL — ABNORMAL HIGH (ref 0.00–0.07)
Basophils Absolute: 0 10*3/uL (ref 0.0–0.1)
Basophils Relative: 0 %
Eosinophils Absolute: 0.1 10*3/uL (ref 0.0–0.5)
Eosinophils Relative: 1 %
HCT: 31.5 % — ABNORMAL LOW (ref 36.0–46.0)
Hemoglobin: 10 g/dL — ABNORMAL LOW (ref 12.0–15.0)
Immature Granulocytes: 1 %
Lymphocytes Relative: 22 %
Lymphs Abs: 2.8 10*3/uL (ref 0.7–4.0)
MCH: 28.4 pg (ref 26.0–34.0)
MCHC: 31.7 g/dL (ref 30.0–36.0)
MCV: 89.5 fL (ref 80.0–100.0)
Monocytes Absolute: 1.2 10*3/uL — ABNORMAL HIGH (ref 0.1–1.0)
Monocytes Relative: 9 %
Neutro Abs: 8.7 10*3/uL — ABNORMAL HIGH (ref 1.7–7.7)
Neutrophils Relative %: 67 %
Platelets: 238 10*3/uL (ref 150–400)
RBC: 3.52 MIL/uL — ABNORMAL LOW (ref 3.87–5.11)
RDW: 14.6 % (ref 11.5–15.5)
WBC: 12.9 10*3/uL — ABNORMAL HIGH (ref 4.0–10.5)
nRBC: 0 % (ref 0.0–0.2)

## 2020-10-26 LAB — GLUCOSE, CAPILLARY
Glucose-Capillary: 74 mg/dL (ref 70–99)
Glucose-Capillary: 94 mg/dL (ref 70–99)
Glucose-Capillary: 153 mg/dL — ABNORMAL HIGH (ref 70–99)
Glucose-Capillary: 96 mg/dL (ref 70–99)

## 2020-10-26 LAB — COMPREHENSIVE METABOLIC PANEL
ALT: 35 U/L (ref 0–44)
AST: 18 U/L (ref 15–41)
Albumin: 2.5 g/dL — ABNORMAL LOW (ref 3.5–5.0)
Alkaline Phosphatase: 59 U/L (ref 38–126)
Anion gap: 9 (ref 5–15)
BUN: 21 mg/dL (ref 8–23)
CO2: 27 mmol/L (ref 22–32)
Calcium: 8.8 mg/dL — ABNORMAL LOW (ref 8.9–10.3)
Chloride: 102 mmol/L (ref 98–111)
Creatinine, Ser: 0.86 mg/dL (ref 0.44–1.00)
GFR, Estimated: >60 mL/min (ref >60)
Glucose, Bld: 81 mg/dL (ref 70–99)
Potassium: 3.8 mmol/L (ref 3.5–5.1)
Sodium: 138 mmol/L (ref 135–145)
Total Bilirubin: 0.6 mg/dL (ref 0.3–1.2)
Total Protein: 5.1 g/dL — ABNORMAL LOW (ref 6.5–8.1)

## 2020-10-26 MED ORDER — FLUTICASONE PROPIONATE 50 MCG/ACT NA SUSP
2.0000 | Freq: Every day | NASAL | Status: DC
  Administered 2020-10-26 – 2020-11-02 (×8): 2 via NASAL
  Filled 2020-10-26: qty 16

## 2020-10-26 MED ORDER — ALUM & MAG HYDROXIDE-SIMETH 200-200-20 MG/5ML PO SUSP: 30.0000 mL | ORAL | Status: DC | PRN

## 2020-10-26 MED ORDER — ACETAMINOPHEN 325 MG PO TABS
325.0000 mg | ORAL_TABLET | ORAL | Status: AC | PRN
Start: 2020-10-26 — End: ?

## 2020-10-26 MED ORDER — ENSURE ENLIVE PO LIQD
237.0000 mL | Freq: Three times a day (TID) | ORAL | Status: DC
  Administered 2020-10-27 – 2020-11-02 (×20): 237 mL via ORAL
  Filled 2020-10-26: qty 237

## 2020-10-26 NOTE — Evaluation (Signed)
Physical Therapy Assessment and Plan  Patient Details  Name: Jean Shaffer MRN: [id number] Date of Birth: 07/08/1951  PT Diagnosis: Abnormal posture, Abnormality of gait, Difficulty walking, Impaired sensation and Muscle weakness Rehab Potential: Good ELOS: 5-7 days   Today's Date: 10/26/2020 PT Individual Time: 0915-1015 PT Individual Time Calculation (min): 60 min    Hospital Problem: Principal Problem:   Debility   Past Medical History:  Past Medical History:  Diagnosis Date  . Anemia   . 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 Asthma   . Chronic back pain   . COPD (chronic obstructive pulmonary disease) ([place])    per pt dx in ~2014  . Heart murmur    as a child per pt  . Hypertension   . Hypothyroidism   . Stroke [facility])    per pt "maybe last year or the year before last, they told me I might've had a TIA, but I blacked out"  . Thyroid disease    Past Surgical History:  Past Surgical History:  Procedure Laterality Date  . ABDOMINAL HYSTERECTOMY    . CATARACT EXTRACTION W/ INTRAOCULAR LENS IMPLANT Left 07/14/2014  . CATARACT EXTRACTION W/ INTRAOCULAR LENS IMPLANT Right 10/18/2015  . COLONOSCOPY    . EYE SURGERY    . IR RADIOLOGIST EVAL & MGMT  01/08/2020  . LAPAROSCOPIC APPENDECTOMY N/A 01/29/2020   Procedure: LAPAROSCOPIC  APPENDECTOMY;  Surgeon: Rolm Bookbinder, MD;  Location: [place];  Service: General;  Laterality: N/A;  . MULTIPLE TOOTH EXTRACTIONS  2002  . NECK SURGERY    . SPINE SURGERY    . WISDOM TOOTH EXTRACTION     per pt 01/21/20    Assessment & Plan Clinical Impression: Patient is a 69 y.o. year old female with history of HTN, DDD/OA, chronic LBP with radiculopathy, COPD who was admitted on 09/30/2020 with cough and shortness of breath with hypoxia due to COVID-19.  She was treated with remdesivir, baricitinib and steroids with improvement in respiratory status however continued to require high flow supplemental oxygen.  Pulmonary  hygiene recommended and patient on slow steroid taper.  Blood pressure stabilizing therefore amlodipine resumed. She continues to have hypoxia with activity requiring HF oxygen and limited by debility. CIR recommended due to functional decline. Denies constipation, pain. Lives with her children and grandchildren. Continue to desat significantly with movement. Has steroid-induced leukocytosis and insomnia, the latter has improved with sleep aides. Patient transferred to CIR on 10/25/2020 .   Patient currently requires supervision with mobility secondary to muscle weakness, decreased cardiorespiratoy endurance and decreased standing balance and decreased balance strategies.  Prior to hospitalization, patient was independent  with mobility and lived with Family in a House home.  Home access is 2Stairs to enter.  Patient will benefit from skilled PT intervention to maximize safe functional mobility, minimize fall risk and decrease caregiver burden for planned discharge home with intermittent assist.  Anticipate patient will benefit from follow up [facility] at discharge.  PT - End of Session Activity Tolerance: Tolerates 30+ min activity with multiple rests Endurance Deficit: Yes Endurance Deficit Description: shortness of breath with all activity and frequently needs rest breaks, noted desaturation with O2 with activity recovers with rest, on 3L via [place] PT Assessment Rehab Potential (ACUTE/IP ONLY): Good PT Barriers to Discharge: Decreased caregiver support;Inaccessible home environment;Home environment access/layout;New oxygen;Lack of/limited family support PT Barriers to Discharge Comments: family not home during the day, will have intermittent check ins PT Patient demonstrates  impairments in the following area(s): Balance;Safety;Endurance PT Transfers Functional Problem(s): Bed Mobility;Bed to Chair;Car;Furniture PT Locomotion Functional Problem(s): Ambulation;Stairs PT Plan PT Intensity: Minimum of 1-2  x/day ,45 to 90 minutes PT Frequency: 5 out of 7 days PT Duration Estimated Length of Stay: 5-7 days PT Treatment/Interventions: Ambulation/gait training;Balance/vestibular training;Community reintegration;Discharge planning;Disease management/prevention;DME/adaptive equipment instruction;Functional mobility training;Pain management;Psychosocial support;Splinting/orthotics;Stair training;Patient/family education;Neuromuscular re-education;Therapeutic Exercise;UE/LE Coordination activities;Wheelchair propulsion/positioning;Visual/perceptual remediation/compensation;Therapeutic Activities;UE/LE Strength taining/ROM PT Transfers Anticipated Outcome(s): mod I PT Locomotion Anticipated Outcome(s): mod I PT Recommendation Recommendations for Other Services: Therapeutic Recreation consult Therapeutic Recreation Interventions: Stress management Follow Up Recommendations: Home health PT Patient destination: Home Equipment Recommended: To be determined   PT Evaluation Precautions/Restrictions Precautions Precautions: Fall Precaution Comments: monitor sats, 2-4L O2 via [place] Restrictions Weight Bearing Restrictions: No General   Vital SignsTherapy Vitals Pulse Rate: 94 Resp: 20 Patient Position (if appropriate): Lying Oxygen Therapy SpO2: 92 % O2 Device: Nasal Cannula O2 Flow Rate (L/min): 3 L/min Pain Pain Assessment Pain Scale: 0-10 Pain Score: 0-No pain Home Living/Prior Functioning Home Living Available Help at Discharge: Family;Available PRN/intermittently (pt's family works and goes to school during the day and has one family memeber to check in intermittently) Type of Home: House Home Access: Stairs to enter [employer] of Steps: 2 Entrance Stairs-Rails: None Home Layout: One level Bathroom Shower/Tub: Chiropodist: Standard Bathroom Accessibility: Yes Additional Comments: Reports using a cane PRN secondary to "back problems"  Lives With:  Family Prior Function Level of Independence: Independent with basic ADLs;Independent with homemaking with ambulation;Independent with gait  Able to Take Stairs?: Yes Driving: Yes Vocation: Retired Art gallery manager: Within Advertising copywriter Praxis Praxis: Intact  Cognition Overall Cognitive Status: Within Functional Limits for tasks assessed Arousal/Alertness: Awake/alert Orientation Level: Oriented X4 Awareness: Appears intact Problem Solving: Appears intact Safety/Judgment: Appears intact Sensation Sensation Light Touch: Impaired Detail Peripheral sensation comments: Numbness in L leg x several months. Patient reports MRI (+) bulging disc and scar tissue pressing against nerves in her back. Light Touch Impaired Details: Impaired LLE Coordination Gross Motor Movements are Fluid and Coordinated: Yes Fine Motor Movements are Fluid and Coordinated: No Motor  Motor Motor: Within Functional Limits   Trunk/Postural Assessment  Cervical Assessment Cervical Assessment: Exceptions to [facility] (forward head carriage) Thoracic Assessment Thoracic Assessment: Exceptions to [facility] (mild kyphosis) Lumbar Assessment Lumbar Assessment: Exceptions to [facility] (posterior pelvic tilt) Postural Control Postural Control: Deficits on evaluation  Balance Balance Balance Assessed: Yes Standardized Balance Assessment Standardized Balance Assessment: Dynamic Gait Index Dynamic Gait Index Level Surface: Mild Impairment Change in Gait Speed: Mild Impairment Gait with Horizontal Head Turns: Mild Impairment Gait with Vertical Head Turns: Mild Impairment Gait and Pivot Turn: Mild Impairment Step Over Obstacle: Mild Impairment Step Around Obstacles: Mild Impairment Steps: Mild Impairment Total Score: 16 Extremity Assessment      RLE Assessment RLE Assessment: Exceptions to [facility] Passive Range of Motion (PROM) Comments: WFL Active Range of Motion (AROM) Comments: WFL General  Strength Comments: grossly 4/5 LLE Assessment LLE Assessment: Exceptions to [facility] Passive Range of Motion (PROM) Comments: WFL Active Range of Motion (AROM) Comments: WFL General Strength Comments: grossly 4/5  Care Tool Care Tool Bed Mobility Roll left and right activity   Roll left and right assist level: Independent    Sit to lying activity   Sit to lying assist level: Supervision/Verbal cueing    Lying to sitting edge of bed activity   Lying to sitting edge of bed assist level: Supervision/Verbal cueing  Care Tool Transfers Sit to stand transfer   Sit to stand assist level: Contact Guard/Touching assist    Chair/bed transfer   Chair/bed transfer assist level: Contact Guard/Touching assist     Toilet transfer   Assist Level: Contact Guard/Touching assist    Car transfer   Car transfer assist level: Contact Guard/Touching assist      Care Tool Locomotion Ambulation   Assist level: Contact Guard/Touching assist Assistive device: Walker-rolling Max distance: 75  Walk 10 feet activity   Assist level: Contact Guard/Touching assist Assistive device: Walker-rolling   Walk 50 feet with 2 turns activity   Assist level: Contact Guard/Touching assist Assistive device: Walker-rolling  Walk 150 feet activity Walk 150 feet activity did not occur: Safety/medical concerns      Walk 10 feet on uneven surfaces activity   Assist level: Minimal Assistance - Patient > 75% Assistive device: Walker-rolling  Stairs   Assist level: Minimal Assistance - Patient > 75% Stairs assistive device: 2 hand rails Max number of stairs: 4  Walk up/down 1 step activity   Walk up/down 1 step (curb) assist level: Minimal Assistance - Patient > 75%      Walk up/down 4 steps activity Walk up/down 4 steps assist level: Minimal Assistance - Patient > 75% Walk up/down 4 steps assistive device: 2 hand rails  Walk up/down 12 steps activity Walk up/down 12 steps activity did not occur:  Safety/medical concerns      Pick up small objects from floor   Pick up small object from the floor assist level: Minimal Assistance - Patient > 75% Pick up small object from the floor assistive device: RW  Wheelchair Will patient use wheelchair at discharge?: No          Wheel 50 feet with 2 turns activity      Wheel 150 feet activity        Refer to Care Plan for [place]   Evaluation completed (see details above and below) with education on PT POC and goals and individual treatment initiated with focus on standing balance, tolerance to activity, gait training, stair training, fall prevention. pt received in bed and agreeable to therapy. Pt directed in supine>sit at supervision, mod I for static and dynamic sitting balance at EOB for transfer preparation and donning B shoes with setup. Pt directed in Sit to stand from bed without assistive device CGA, gait training without assistive device for 10' to restroom CGA with pt able to navigate O2 tubing with mobility successfully and safely. Pt directed in toilet transfer without assistive device and pt able to void bladder, I with hygiene; use of grab bar to stand from toilet but CGA for gait training without AD to sink and I with hand washing. Pt then directed into WC and taken into hall for gait training to limit numerous obstacles in hall by pt's room. Pt directed in 92' gait training with Rolling walker CGA with x2 standing rest breaks then directed in rest break 2/2 fatigue and shortness of breath.  O2 monitored to be 83% but quickly recovered to 92% with sitting rest. Pt then taken distance to gym in [facility] for energy conservation, directed in car transfer with simulated car to pt's estimated car height, CGA and prolonged rest break once seated in car. Pt then directed in gait training with Rolling walker ascending and descending ramp min A-CGA for stability. Pt taken to gym in [facility] for stair training for energy. Pt  then directed in ascending and descending x4 stairs at min A with B handrails used, pt unable to complete with one handrail with slight LOB noted when attempted, mod A to correct. Pt directed in additional gait training with Rolling walker for 70' CGA with VC for increased stride length and trunk extension and breathing technique. Pt also educated on energy conservation techniques. Pt scored 16/24 on the DGI which is indicative of higher fall risk. Pt returned to room in [facility] for energy conservation and requested to return to bed to rest, directed in Sit to stand and step transfer without assistive device CGA and sit>supine supervision. Pt left in bed, alarm set, All needs in reach and in good condition. Call light in hand.    Recommendations for other services: Therapeutic Recreation  Stress management  Skilled Therapeutic Intervention Mobility Bed Mobility Bed Mobility: Rolling Right;Rolling Left;Sitting - Scoot to Edge of Bed;Supine to Sit;Sit to Supine;Scooting to [facility] Rolling Right: Independent Rolling Left: Independent Supine to Sit: Supervision/Verbal cueing Sitting - Scoot to Edge of Bed: Supervision/Verbal cueing Sit to Supine: Supervision/Verbal cueing Scooting to HOB: Supervision/Verbal Cueing Transfers Transfers: Sit to Stand;Stand to Sit Sit to Stand: Contact Guard/Touching assist Stand to Sit: Contact Guard/Touching assist Transfer (Assistive device): Rolling walker Locomotion  Gait Ambulation: Yes Gait Assistance: Contact Guard/Touching assist Gait Distance (Feet): 75 Feet Assistive device: Rolling walker Gait Gait: Yes Gait Pattern: Impaired Gait Pattern: Step-through pattern;Trunk flexed;Wide base of support Gait velocity: decreased Stairs / Additional Locomotion Stairs: Yes Stairs Assistance: Minimal Assistance - Patient > 75% Stair Management Technique: Two rails;Step to pattern Number of Stairs: 4 Height of Stairs: 6 Ramp: Minimal Assistance - Patient >75% Curb:  Minimal Assistance - Patient >75% Wheelchair Mobility Wheelchair Mobility: No   Discharge Criteria: Patient will be discharged from PT if patient refuses treatment 3 consecutive times without medical reason, if treatment goals not met, if there is a change in medical status, if patient makes no progress towards goals or if patient is discharged from hospital.  The above assessment, treatment plan, treatment alternatives and goals were discussed and mutually agreed upon: by patient  [name] 10/26/2020, 12:03 PM

## 2020-10-26 NOTE — Progress Notes (Signed)
Physical Therapy Session Note  Patient Details  Name: Jean Shaffer MRN: [id number] Date of Birth: Jan 15, 1951  Today's Date: 10/26/2020 PT Individual Time: 1330-1430 PT Individual Time Calculation (min): 60 min   Short Term Goals: Week 1:   see PT eval  Skilled Therapeutic Interventions/Progress Updates:  Pt received seated in recliner in room, agreeable to PT session. No complaints of fatigue. Pt on 3L O2 at rest, SpO2 93% and higher. SpO2 drops to 82% with activity and does not recover to 90% (+) with pursed lip breathing after several minutes, increased to 4L O2 with activity. SpO2 does drop to 87% with activity on 4L O2 but returns to 90% (+) with pursed lip breathing and seated rest breaks. Stand pivot transfer recliner to w/c with no AD and CGA. Standing alt L/R 4" step-taps with no UE support and CGA for balance, pt able to perform 2 x 10 reps with seated rest break in between reps due to low endurance. Trial gait with no AD, 4 x 25 ft with no AD and CGA for balance. Pt exhibits good balance without use of AD but fatigues more quickly than when using RW. Nustep level 4 x 5 min with use of B UE/LE for global endurance training. Pt reports RPE of 13 on Borg Scale while performing Nustep. Toilet transfer with CGA with use of grab bars intermittently. Pt is setup A for pericare and clothing management. Pt requests to return to bed at end of session. Bed mobility independent. Pt left supine in bed with needs in reach, bed alarm in place at end of session.  Therapy Documentation Precautions:  Precautions Precautions: Fall Precaution Comments: monitor sats, 2-4L O2 via [place] Restrictions Weight Bearing Restrictions: No     Therapy/Group: Individual Therapy   Excell Seltzer, PT, DPT  10/26/2020, 5:17 PM

## 2020-10-26 NOTE — Care Management (Signed)
[place] Individual Statement of Services  Patient Name:  TERRIAN RIDLON  Date:  10/26/2020  Welcome to the [facility].  Our goal is to provide you with an individualized program based on your diagnosis and situation, designed to meet your specific needs.  With this comprehensive rehabilitation program, you will be expected to participate in at least 3 hours of rehabilitation therapies Monday-Friday, with modified therapy programming on the weekends.  Your rehabilitation program will include the following services:  Physical Therapy (PT), Occupational Therapy (OT), 24 hour per day rehabilitation nursing, Therapeutic Recreaction (TR), Psychology, Neuropsychology, Care Coordinator, Rehabilitation Medicine, Nutrition Services, Pharmacy Services and Other  Weekly team conferences will be held on Tuesdays to discuss your progress.  Your Inpatient Rehabilitation Care Coordinator will talk with you frequently to get your input and to update you on team discussions.  Team conferences with you and your family in attendance may also be held.  Expected length of stay: 5-7 days  Overall anticipated outcome: Independent with Assistive Device  Depending on your progress and recovery, your program may change. Your Inpatient Rehabilitation Care Coordinator will coordinate services and will keep you informed of any changes. Your Inpatient Rehabilitation Care Coordinator's name and contact numbers are listed  below.  The following services may also be recommended but are not provided by the [facility] will be made to provide these services after discharge if needed.  Arrangements include referral to agencies that provide these services.  Your insurance has been verified to be:  Medicare A/B  Your  primary doctor is: Suzanna Obey    Pertinent information will be shared with your doctor and your insurance company.  Inpatient Rehabilitation Care Coordinator:  Cathleen Corti [phone] or ([phone]  Information discussed with and copy given to patient by: Rana Snare, 10/26/2020, 4:14 PM

## 2020-10-26 NOTE — Progress Notes (Signed)
Occupational Therapy Session Note  Patient Details  Name: Jean Shaffer MRN: [id number] Date of Birth: 1951/01/15  Today's Date: 10/26/2020 OT Individual Time: 1130-1155 OT Individual Time Calculation (min): 25 min    Short Term Goals: Week 1:  OT Short Term Goal 1 (Week 1): N/A 2/2 ELOS  Skilled Therapeutic Interventions/Progress Updates:    Pt asleep upon arrival but easily aroused.  OT intervention with focus on discharge planning, energy conservation strategies, bed mobility, and safety awareness. Pt has tub with shower at home. Pt pleased with progress. CGA for bed mobility. O2 at 92% on 3L O2 at rest. Pt will be alone during the day when at home. Pt does not want BSC but will need DME (TBD)for shower. Pt remained in bed with bed alarm activated. All needs within reach.   Therapy Documentation Precautions:  Precautions Precautions: Fall Precaution Comments: monitor sats, 2-4L O2 via [place] Restrictions Weight Bearing Restrictions: No Pain: Pain Assessment Pain Scale: 0-10 Pain Score: 0-No pain   Therapy/Group: Individual Therapy  Leroy Libman 10/26/2020, 12:06 PM

## 2020-10-26 NOTE — Patient Care Conference (Signed)
Inpatient RehabilitationTeam Conference and Plan of Care Update Date: 10/26/2020   Time: 11:11 AM    Patient Name: Jean Shaffer      Medical Record Number: [id number]  Date of Birth: [birth year] Sex: Female         Room/Bed: 4W03C/4W03C-01 Payor Info: Payor: MEDICARE / Plan: MEDICARE PART A AND B / Product Type: *No Product type* /    Admit Date/Time:  10/25/2020  1:40 PM  Primary Diagnosis:  [facility] Problems: Principal Problem:   Debility    Expected Discharge Date: Expected Discharge Date: 11/02/20  Team Members Present: Physician leading conference: Dr. Courtney Heys Care Coodinator Present: Loralee Pacas, LCSWA;Yetunde Leis Creig Hines, RN, BSN, [place] Nurse Present: Mohammed Kindle, RN PT Present: Excell Seltzer, PT OT Present: Roanna Epley, COTA;Jennifer Tamala Julian, OT PPS Coordinator present : Gunnar Fusi, Novella Olive, PT     Current Status/Progress Goal Weekly Team Focus  Bowel/Bladder   Continent of bowel/bladder LBM  Maintain continence  Assess toileting needs QS/PRN   Swallow/Nutrition/ Hydration             ADL's     Eval pending        Mobility     Eval pending        Communication             Safety/Cognition/ Behavioral Observations            Pain   Denies pain  remain painfree or <;= 3  Assess PAIN QS/PRN,   Skin      skin intact  No report of injury or fall while on IP Rehab     Discharge Planning:  To be assessed. Per EMR pt lives with her son, dtr in law, and his children. Pt was independent prior to admission.   Team Discussion: Continent bowel and bladder, no pain, skin CDI, on humidified O2 at 3L. PT reports patient patient was evaled this morning at a contact guard level. OT reports patient is min assist with mod I goals. Needs to build up activity tolerance and endurance. SW reports patient will need to have a walking trial on 11/01/20 of O2 at discharge. Patient on target to meet rehab goals: yes, still waiting on all goals to be  entered from therapy evals.  *See Care Plan and progress notes for long and short-term goals.   Revisions to Treatment Plan:  None at this time.  Teaching Needs: Begin family education when appropriate.  Current Barriers to Discharge: Decreased caregiver support, Medical stability, Home enviroment access/layout, Medication compliance and New oxygen  Possible Resolutions to Barriers: Continue current medications, continue current oxygen therapy, and educate family on oxygen therapy.     Medical Summary Current Status: Nasal stuffines-s hard to use O2 by [place]- can't breathe well; continent B/B- denies pain; no skin issues  Barriers to Discharge: Home enviroment access/layout;Decreased family/caregiver support;Medical stability;New oxygen;Other (comments)  Barriers to Discharge Comments: s/p COVID pneumonia- 11/9- walking trial 11/8 for O2 Possible Resolutions to Barriers/Weekly Focus: CGA with PT; dropped to 83% with PT- and 85% for OT- goals 5-7 days/ mod I goals- added flonase and humidified O2.   Continued Need for Acute Rehabilitation Level of Care: The patient requires daily medical management by a physician with specialized training in physical medicine and rehabilitation for the following reasons: Direction of a multidisciplinary physical rehabilitation program to maximize functional independence : Yes Medical management of patient stability for increased activity during participation in an intensive rehabilitation regime.: Yes Analysis  of laboratory values and/or radiology reports with any subsequent need for medication adjustment and/or medical intervention. : Yes   I attest that I was present, lead the team conference, and concur with the assessment and plan of the team.   Cristi Loron 10/26/2020, 4:55 PM

## 2020-10-26 NOTE — Progress Notes (Signed)
[facility] PHYSICAL MEDICINE & REHABILITATION PROGRESS NOTE   Subjective/Complaints:   Pt reports nose is very stuffy/clogged up- blood streaked.  LBM yesterday.  sats dropped to 85% and took awhile to come up- so increased from 3L to 4L.   Marland KitchenDenies pain.   ROS:  Pt denies SOB, abd pain, CP, N/V/C/D, and vision changes   Objective:   No results found. Recent Labs    10/26/20 0610  WBC 12.9*  HGB 10.0*  HCT 31.5*  PLT 238   Recent Labs    10/26/20 0610  NA 138  K 3.8  CL 102  CO2 27  GLUCOSE 81  BUN 21  CREATININE 0.86  CALCIUM 8.8*   No intake or output data in the 24 hours ending 10/26/20 0851         Physical Exam: Vital Signs Blood pressure 132/62, pulse 90, temperature 98.2 F (36.8 C), temperature source Oral, resp. rate 19, height 5\' 4"  (1.626 m), weight 55.6 kg, SpO2 98 %.  General: Awake, alert, getting dressed with OT help, sitting EOB, NAD HEENT: Head is normocephalic, wearing [place] O2 3L currently  Neck: Supple without JVD or lymphadenopathy Heart: Tachycardic- 118-120- with movement;  Chest: good air movement- a lot of upper airway sounds- some wheezing and mild tachypnea Abdomen: Soft, NT, ND, (+)BS  Extremities: No clubbing, cyanosis, or edema. Pulses are 2+ Skin: IV in arm with small bruise Neuro: Pt is cognitively appropriate with normal insight, memory, and awareness. Cranial nerves 2-12 are intact. Sensory exam is normal. Reflexes are 2+ in all 4's. Fine motor coordination is intact. No tremors. Motor function is grossly 5/5.  Musculoskeletal: Full ROM, No pain with AROM or PROM in the neck, trunk, or extremities. Posture appropriate Psych: pt appropriate    Assessment/Plan: 1. Functional deficits secondary to COVID pneumonia/debility which require 3+ hours per day of interdisciplinary therapy in a comprehensive inpatient rehab setting.  Physiatrist is providing close team supervision and 24 hour management of active medical problems  listed below.  Physiatrist and rehab team continue to assess barriers to discharge/monitor patient progress toward functional and medical goals  Care Tool:  Bathing              Bathing assist       Upper Body Dressing/Undressing Upper body dressing        Upper body assist      Lower Body Dressing/Undressing Lower body dressing            Lower body assist       Toileting Toileting    Toileting assist Assist for toileting: Supervision/Verbal cueing     Transfers Chair/bed transfer  Transfers assist           Locomotion Ambulation   Ambulation assist              Walk 10 feet activity   Assist           Walk 50 feet activity   Assist           Walk 150 feet activity   Assist           Walk 10 feet on uneven surface  activity   Assist           Wheelchair     Assist               Wheelchair 50 feet with 2 turns activity    Assist  Wheelchair 150 feet activity     Assist          Blood pressure 132/62, pulse 90, temperature 98.2 F (36.8 C), temperature source Oral, resp. rate 19, height 5\' 4"  (1.626 m), weight 55.6 kg, SpO2 98 %.    Medical Problem List and Plan: 1.  COVID debility             -patient may shower             -ELOS/Goals: modI in 5-7 days 2.  Antithrombotics: -DVT/anticoagulation:  Pharmaceutical: Lovenox             -antiplatelet therapy: Aspirin 81mg  3. Pain Management: Tylenol as needed- denies pain  11/2- denies pain-con't tylenol prn.  4. Mood: LCSW to follow for evaluation and support             -antipsychotic agents: N/AA 5. Neuropsych: This patient is capable of making decisions on her own behalf. 6. Skin/Wound Care: Routine pressure-relief measures.  On vitamin C, multivitamin and zinc daily. May d/c IV 7. Fluids/Electrolytes/Nutrition: Monitor I's/O.  Encourage fluid intake.  Continue supplements to help promote healing 8.  COPD:  Continue to encourage pulmonary hygiene with use of flutter valve.  On prednisone 40 mg p.o. per day.  Wean oxygen as tolerated to keep saturations around 88% or higher.  Continue Breo and Incruse daily.  11/2- will humidify O2 and titrate up some due to increased movement- 3-4L O2 by - and monitor 9.  HTN: Monitor blood pressures 3 times daily.  Continue amlodipine  11/2- BP well controlled- con't regimen 10.  Steroid-induced hyperglycemia: Resolving with increase in activity.  Continue monitoring blood sugars AC at bedtime is still on steroids  with sliding scale insulin as needed 11.  Leukocytosis: Monitor for fevers or other signs of infection.  White count improving overall--likely steroid-induced but mildly elevated at baseline per records.   CRP/D-dimer on downward trend. 12.  History of depression: Continue Wellbutrin XL 13.  Insomnia: Continue trazodone at bedtime, which is helping.  14. Nasal stuffiness  11/2- add Flonase 2 sprays daily since hard ot breath through nose.    LOS: 1 days A FACE TO FACE EVALUATION WAS PERFORMED  Ryelee Albee 10/26/2020, 8:51 AM

## 2020-10-26 NOTE — Progress Notes (Signed)
Occupational Therapy Assessment and Plan  Patient Details  Name: Jean Shaffer MRN: [id number] Date of Birth: 02-Mar-1951  OT Diagnosis: muscle weakness (generalized) Rehab Potential: Rehab Potential (ACUTE ONLY): Good ELOS: 5-7 days   Today's Date: 10/26/2020 OT Individual Time: 5631-4970 OT Individual Time Calculation (min): 60 min     Hospital Problem: Principal Problem:   Debility   Past Medical History:  Past Medical History:  Diagnosis Date   Anemia    Arthritis    osteoarthritis in knees and hands ~2016 dx   Asthma    Chronic back pain    COPD (chronic obstructive pulmonary disease) ([place])    per pt dx in ~2014   Heart murmur    as a child per pt   Hypertension    Hypothyroidism    Stroke ([place])    per pt "maybe last year or the year before last, they told me I might've had a TIA, but I blacked out"   Thyroid disease    Past Surgical History:  Past Surgical History:  Procedure Laterality Date   ABDOMINAL HYSTERECTOMY     CATARACT EXTRACTION W/ INTRAOCULAR LENS IMPLANT Left 07/14/2014   CATARACT EXTRACTION W/ INTRAOCULAR LENS IMPLANT Right 10/18/2015   COLONOSCOPY     EYE SURGERY     IR RADIOLOGIST EVAL & MGMT  01/08/2020   LAPAROSCOPIC APPENDECTOMY N/A 01/29/2020   Procedure: LAPAROSCOPIC  APPENDECTOMY;  Surgeon: Rolm Bookbinder, MD;  Location: [facility];  Service: General;  Laterality: N/A;   MULTIPLE TOOTH EXTRACTIONS  2002   [place] EXTRACTION     per pt 01/21/20    Assessment & Plan Clinical Impression: Jean Shaffer is a 69 year old female with history of HTN, DDD/OA, chronic LBP with radiculopathy, COPD who was admitted on 09/30/2020 with cough and shortness of breath with hypoxia due to COVID-19.  She was treated with remdesivir, baricitinib and steroids with improvement in respiratory status however continued to require high flow supplemental oxygen.  Pulmonary hygiene recommended and  patient on slow steroid taper.  Blood pressure stabilizing therefore amlodipine resumed. She continues to have hypoxia with activity requiring HF oxygen and limited by debility. CIR recommended due to functional decline. Denies constipation, pain. Lives with her children and grandchildren. Continue to desat significantly with movement. Has steroid-induced leukocytosis and insomnia, the latter has improved with sleep aides. Patient transferred to CIR on 10/25/2020.    Patient currently requires min with basic self-care skills secondary to decreased cardiorespiratoy endurance and decreased oxygen support. Prior to hospitalization, patient could complete ADLs/IADLs with modified independence.  Patient will benefit from skilled intervention to decrease level of assist with basic self-care skills, increase independence with basic self-care skills and increase level of independence with iADL prior to discharge home with care partner.  Anticipate patient will require intermittent supervision and follow up home health.  OT - End of Session Activity Tolerance: Tolerates 10 - 20 min activity with multiple rests Endurance Deficit: Yes OT Assessment Rehab Potential (ACUTE ONLY): Good OT Barriers to Discharge: Decreased caregiver support OT Patient demonstrates impairments in the following area(s): Balance;Endurance;Safety;Sensory OT Basic ADL's Functional Problem(s): Grooming;Bathing;Dressing;Toileting OT Advanced ADL's Functional Problem(s): Simple Meal Preparation;Laundry OT Transfers Functional Problem(s): Toilet;Tub/Shower OT Additional Impairment(s): None OT Plan OT Intensity: Minimum of 1-2 x/day, 45 to 90 minutes OT Frequency: 5 out of 7 days OT Duration/Estimated Length of Stay: 5-7 days OT Treatment/Interventions: Balance/vestibular training;Community reintegration;Discharge planning;DME/adaptive equipment instruction;Functional  mobility training;Patient/family education;Self Care/advanced ADL  retraining;Therapeutic Activities OT Self Feeding Anticipated Outcome(s): Independent OT Basic Self-Care Anticipated Outcome(s): Mod I OT Toileting Anticipated Outcome(s): Mod I OT Bathroom Transfers Anticipated Outcome(s): Mod I OT Recommendation Patient destination: Home Follow Up Recommendations: Home health OT Equipment Recommended: To be determined   OT Evaluation Precautions/Restrictions  Precautions Precautions: Fall Precaution Comments: monitor sats, 2-4L O2 via [place] Restrictions Weight Bearing Restrictions: No General Chart Reviewed: Yes Family/Caregiver Present: No Vital Signs Therapy Vitals Pulse Rate: 94 Resp: 20 BP: 132/62 Patient Position (if appropriate): Lying Oxygen Therapy SpO2: 92 % O2 Device: Nasal Cannula O2 Flow Rate (L/min): 3 L/min Patient Activity (if Appropriate): In bed Pain Pain Assessment Pain Scale: 0-10 Pain Score: 0-No pain Home Living/Prior Functioning Home Living Family/patient expects to be discharged to:: Private residence Living Arrangements: Children Available Help at Discharge: Family, Available PRN/intermittently (pt's family works and goes to school during the day and has one family memeber to check in intermittently) Type of Home: House Home Access: Stairs to enter Technical brewer of Steps: 2 Entrance Stairs-Rails: None Home Layout: One level Bathroom Shower/Tub: Chiropodist: Standard Bathroom Accessibility: Yes Additional Comments: Reports using a cane PRN secondary to "back problems"  Lives With: Family IADL History Homemaking Responsibilities: Yes Meal Prep Responsibility: Primary Laundry Responsibility: Primary Cleaning Responsibility: Primary Current License: Yes Mode of Transportation: Car Prior Function Level of Independence: Independent with basic ADLs, Independent with homemaking with ambulation, Independent with gait  Able to Take Stairs?: Yes Driving: Yes 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 Patient also reports playing soccer and taking karate. Vision Baseline Vision/History: Wears glasses Wears Glasses: Reading only Patient Visual Report: No change from baseline Vision Assessment?: No apparent visual deficits Perception  Perception: Within Functional Limits Praxis Praxis: Intact Cognition Overall Cognitive Status: Within Functional Limits for tasks assessed Arousal/Alertness: Awake/alert Orientation Level: Person;Place;Situation Person: Oriented Place: Oriented Situation: Oriented Year: 2021 Month: November Day of Week: Correct Memory: Appears intact Immediate Memory Recall: Sock;Blue;Bed Memory Recall Sock: Without Cue Memory Recall Blue: Without Cue Memory Recall Bed: Without Cue Awareness: Appears intact Problem Solving: Appears intact Safety/Judgment: Appears intact Sensation Sensation Light Touch: Impaired Detail Peripheral sensation comments: Numbness in L leg x several months. Patient reports MRI (+) bulging disc and scar tissue pressing against nerves in her back. Light Touch Impaired Details: Impaired LLE Coordination Gross Motor Movements are Fluid and Coordinated: Yes Fine Motor Movements are Fluid and Coordinated: No Motor  Motor Motor: Within Functional Limits  Trunk/Postural Assessment  Cervical Assessment Cervical Assessment: Exceptions to [facility] (forward head carriage) Thoracic Assessment Thoracic Assessment: Exceptions to [facility] (mild kyphosis) Lumbar Assessment Lumbar Assessment: Exceptions to [facility] (posterior pelvic tilt) Postural Control Postural Control: Deficits on evaluation  Balance Balance Balance Assessed: Yes Standardized Balance Assessment Standardized Balance Assessment: Dynamic Gait Index Dynamic Gait Index Level Surface: Mild Impairment Change in Gait Speed: Mild Impairment Gait with Horizontal Head Turns: Mild  Impairment Gait with Vertical Head Turns: Mild Impairment Gait and Pivot Turn: Mild Impairment Step Over Obstacle: Mild Impairment Step Around Obstacles: Mild Impairment Steps: Mild Impairment Total Score: 16 Extremity/Trunk Assessment RUE Assessment RUE Assessment: Within Functional Limits General Strength Comments: Generalized weakness LUE Assessment LUE Assessment: Within Functional Limits General Strength Comments: Generalized weakness  Care Tool Care Tool Self Care Eating   Eating Assist Level: Independent    Oral Care    Oral Care Assist Level: Set up assist  Bathing   Body parts bathed by patient: Right arm;Left arm;Chest;Abdomen;Front perineal area;Buttocks;Right upper leg;Left upper leg;Right lower leg;Left lower leg;Face     Assist Level: Contact Guard/Touching assist    Upper Body Dressing(including orthotics)   What is the patient wearing?: Pull over shirt   Assist Level: Supervision/Verbal cueing    Lower Body Dressing (excluding footwear)   What is the patient wearing?: Pants;Underwear/pull up Assist for lower body dressing: Contact Guard/Touching assist    Putting on/Taking off footwear   What is the patient wearing?: Socks;Shoes Assist for footwear: Set up assist       Care Tool Toileting Toileting activity   Assist for toileting: Contact Guard/Touching assist     Care Tool Bed Mobility Roll left and right activity   Roll left and right assist level: Independent    Sit to lying activity   Sit to lying assist level: Supervision/Verbal cueing    Lying to sitting edge of bed activity   Lying to sitting edge of bed assist level: Supervision/Verbal cueing     Care Tool Transfers Sit to stand transfer   Sit to stand assist level: Contact Guard/Touching assist    Chair/bed transfer   Chair/bed transfer assist level: Contact Guard/Touching assist     Toilet transfer   Assist Level: Contact Guard/Touching assist     Care Tool  Cognition Expression of Ideas and Wants Expression of Ideas and Wants: Without difficulty (complex and basic) - expresses complex messages without difficulty and with speech that is clear and easy to understand   Understanding Verbal and Non-Verbal Content Understanding Verbal and Non-Verbal Content: Understands (complex and basic) - clear comprehension without cues or repetitions   Memory/Recall Ability *first 3 days only Memory/Recall Ability *first 3 days only: Current season;Staff names and faces;That he or she is in a hospital/hospital unit    Refer to Care Plan for [place] 1 OT Short Term Goal 1 (Week 1): N/A 2/2 ELOS  Recommendations for other services: Other: TBD   Skilled Therapeutic Intervention  Patient met lying supine in bed in agreement with OT assessment/treatment. OT provided education on process of rehab, goals, and ELOS with patient in agreement. SpO2 96% on 3L at rest. Functional mobility to commode in bathroom without AD and HHA for safety. Toilet transfer and toileting/hygiene/clothing management with CGA. Vitals assessed after ambulation with SpO2 88-92% on 3L. Bathing in sitting/standing with supervision A to [place]. Vitals again assessed with HR 119bpm and SpO2 85-86%. Titrated up to 4L O2 via [place] with patient recovering after 1-2 min. Session concluded with patient lying supine in bed with call bell within reach, bed alarm activated, and all needs met.   ADL ADL Eating: Set up Where Assessed-Eating: Bed level Grooming: Supervision/safety Where Assessed-Grooming: Sitting at sink Upper Body Bathing: Supervision/safety Where Assessed-Upper Body Bathing: Shower Lower Body Bathing: Contact guard Where Assessed-Lower Body Bathing: Shower Upper Body Dressing: Supervision/safety Where Assessed-Upper Body Dressing: Edge of bed Lower Body Dressing: Contact guard Where Assessed-Lower Body Dressing: Edge of bed Toileting: Contact guard Where  Assessed-Toileting: Glass blower/designer: Therapist, music Method: Product/process development scientist Method: Heritage manager: Radio broadcast assistant ADL Comments: Desat with bathing/dressing and grooming seated at sink level on 2-4L O2 via [place] Mobility  Bed Mobility Bed Mobility: Rolling Right;Rolling Left;Sitting - Scoot to [employer] of Bed;Supine to Sit;Sit to Supine;Scooting to [facility] Rolling Right: Independent Rolling Left: Independent Supine to  Sit: Supervision/Verbal cueing Sitting - Scoot to Edge of Bed: Supervision/Verbal cueing Sit to Supine: Supervision/Verbal cueing Scooting to HOB: Supervision/Verbal Cueing Transfers Sit to Stand: Contact Guard/Touching assist Stand to Sit: Contact Guard/Touching assist   Discharge Criteria: Patient will be discharged from OT if patient refuses treatment 3 consecutive times without medical reason, if treatment goals not met, if there is a change in medical status, if patient makes no progress towards goals or if patient is discharged from hospital.  The above assessment, treatment plan, treatment alternatives and goals were discussed and mutually agreed upon: by patient  [name] R [place] 10/26/2020, 9:21 AM

## 2020-10-26 NOTE — Progress Notes (Signed)
Inpatient Rehabilitation  Patient information reviewed and entered into eRehab system by Jahliyah Trice M. Andera Cranmer, M.A., CCC/SLP, PPS Coordinator.  Information including medical coding, functional ability and quality indicators will be reviewed and updated through discharge.    

## 2020-10-26 NOTE — Progress Notes (Signed)
Initial Nutrition Assessment  DOCUMENTATION CODES:   Severe malnutrition in context of acute illness/injury  INTERVENTION:   - Increase Ensure Enlive po to TID, each supplement provides 350 kcal and 20 grams of protein  - Continue liquid MVI daily  - Encourage adequate PO intake  NUTRITION DIAGNOSIS:   Severe Malnutrition related to acute illness (COVID-19) as evidenced by mild fat depletion, moderate muscle depletion, percent weight loss (14.1% weight loss in less than 1 month).  GOAL:   Patient will meet greater than or equal to 90% of their needs  MONITOR:   PO intake, Supplement acceptance, Labs, Weight trends  REASON FOR ASSESSMENT:   Malnutrition Screening Tool    ASSESSMENT:   69 year old female with PMH of HTN, DDD/OA, chronic LBP with radiculopathy, COPD. Pt was admitted on 09/30/20 with cough and SOB with hypoxia due to COVID-19. Pt continues to have hypoxia with activity requiring HF oxygen and limited by debility. Admitted to CIR on 11/01.   Spoke with pt at bedside. Pt reports appetite is excellent due to being on prednisone. Pt states that she completed 100% of breakfast meal which is consistent with what was charted by nursing. Pt endorses consuming Ensure Enlive supplements and states that she drinks 2-3 supplements daily.  Pt was not aware that she had lost weight and thought she had gained weight. Pt reports her UBW as 135-145 lbs. Reviewed weight history in chart. Weight on 10/07 was 64.7 kg. Weight on 10/27 was 62.7 kg. Current weight is 55.6 kg. If accurate, pt with an overall 9.1 kg weight loss in less than 1 month. This is a 14.1% weight loss which is significant for timeframe.  Discussed with pt the importance of continued adequate PO intake and supplement consumption. Pt expresses understanding.  Meal Completion: 100%  Medications reviewed and include: vitamin C 500 mg daily, Ensure Enlive BID, SSI, liquid MVI, protonix, prednisone, senna, zinc  sulfate 220 mg daily  Labs reviewed: hemoglobin 10.0 CBG's: 74-148 x 24 hours  NUTRITION - FOCUSED PHYSICAL EXAM:    Most Recent Value  Orbital Region Mild depletion  Upper Arm Region Mild depletion  Thoracic and Lumbar Region Unable to assess  Buccal Region Mild depletion  Temple Region Mild depletion  Clavicle Bone Region Mild depletion  Clavicle and Acromion Bone Region Mild depletion  Scapular Bone Region Unable to assess  Dorsal Hand Moderate depletion  Patellar Region Mild depletion  Anterior Thigh Region Moderate depletion  Posterior Calf Region Moderate depletion  Edema (RD Assessment) None  Hair Reviewed  Eyes Reviewed  Mouth Reviewed  Skin Reviewed  Nails Reviewed       Diet Order:   Diet Order            Diet regular Room service appropriate? Yes; Fluid consistency: Thin  Diet effective now                 EDUCATION NEEDS:   Education needs have been addressed  Skin:  Skin Assessment: Reviewed RN Assessment  Last BM:  10/25/20  Height:   Ht Readings from Last 1 Encounters:  10/25/20 5\' 4"  (1.626 m)    Weight:   Wt Readings from Last 1 Encounters:  10/26/20 55.6 kg   BMI:  Body mass index is 21.04 kg/m.  Estimated Nutritional Needs:   Kcal:  1900-2100  Protein:  90-105 grams  Fluid:  2.0 L/day    Gaynell Face, MS, RD, LDN Inpatient Clinical Dietitian Please see [facility] for contact information.

## 2020-10-26 NOTE — Progress Notes (Signed)
Patient Details  Name: Jean Shaffer MRN: [id number] Date of Birth: 08-15-1951  Today's Date: 10/26/2020  Hospital Problems: Principal Problem:   Debility  Past Medical History:  Past Medical History:  Diagnosis Date  . Anemia   . 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 Asthma   . Chronic back pain   . COPD (chronic obstructive pulmonary disease) ([place])    per pt dx in ~2014  . Heart murmur    as a child per pt  . Hypertension   . Hypothyroidism   . Stroke [facility])    per pt "maybe last year or the year before last, they told me I might've had a TIA, but I blacked out"  . Thyroid disease    Past Surgical History:  Past Surgical History:  Procedure Laterality Date  . ABDOMINAL HYSTERECTOMY    . CATARACT EXTRACTION W/ INTRAOCULAR LENS IMPLANT Left 07/14/2014  . CATARACT EXTRACTION W/ INTRAOCULAR LENS IMPLANT Right 10/18/2015  . COLONOSCOPY    . EYE SURGERY    . IR RADIOLOGIST EVAL & MGMT  01/08/2020  . LAPAROSCOPIC APPENDECTOMY N/A 01/29/2020   Procedure: LAPAROSCOPIC  APPENDECTOMY;  Surgeon: Rolm Bookbinder, MD;  Location: [facility];  Service: General;  Laterality: N/A;  . MULTIPLE TOOTH EXTRACTIONS  2002  . NECK SURGERY    . SPINE SURGERY    . WISDOM TOOTH EXTRACTION     per pt 01/21/20   Social History:  reports that she has quit smoking. She has never used smokeless tobacco. She reports that she does not drink alcohol and does not use drugs.  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 Pt states she stopped working in 2008 due to back surgery. Education: college Read: Yes Write: Yes Employment Status: Retired Date Retired/Disabled/Unemployed: 2008 Public relations account executive Issues: Denies Guardian/Conservator: N/A   Abuse/Neglect Abuse/Neglect Assessment Can Be Completed: Yes Physical Abuse: Denies Verbal Abuse: Denies Sexual Abuse: Denies Exploitation of patient/patient's resources: Denies Self-Neglect: Denies  Emotional Status Pt's affect, behavior and adjustment status: Pt in good spirits at time of visit Recent Psychosocial Issues: Denies Psychiatric History: Denies Substance Abuse History: Denies; states rare EtOH use (typically on New Year's Eve).  Patient / Family Perceptions, Expectations & Goals Pt/Family understanding of illness & functional limitations: Pt and family have general understanding of care needs Premorbid pt/family roles/activities: Independent Anticipated changes in roles/activities/participation: Supervision/CG with ambulation/self-care needs at times; otherwise Mod I  [employer]: None Premorbid Home Care/DME Agencies: Other (Comment) ([facility] (arranged while in [facility])) Transportation available at discharge: family Resource referrals recommended: Neuropsychology  Discharge Planning Living Arrangements: Children, Other relatives Support Systems: Children, Other relatives Type of Residence: Private residence Insurance Resources: Medicare, [place] (specify county) Museum/gallery curator Resources: Family Support Financial Screen Referred: No Living Expenses: Own Money Management: Family Does the patient have any problems obtaining your medications?: No Home Management: Pt cooked meals and house cleaning Patient/Family Preliminary Plans: TBD Care Coordinator Barriers to Discharge: Decreased caregiver support, Lack of/limited family support Care  Coordinator Anticipated Follow Up Needs: HH/OP DC Planning Additional Notes/Comments: D/c date 11/9 Expected length of stay: 5-7 days  Clinical Impression SW met with pt in room to introduce self, explain role, and discuss discharge process. Pt is not a English as a second language teacher. DMe: crutches and canes. Pt aware SW to follow-up with her son Quillian Quince to provide d/c updates as pt d/c date is now 11/9. SW informed pt she will discharge to home on o2, and explained process.   SW received updates from [facility] that they did accept pt for HHPT, and will accept at d/c.   SW spoke with pt son Quillian Quince [phone]) to provide above updates. Family edu scheduled for Sunday (11/7) 10am-12pm.   Harlan Stains A Emanuella Nickle 10/26/2020, 3:58 PM

## 2020-10-27 ENCOUNTER — Inpatient Hospital Stay (HOSPITAL_COMMUNITY): Payer: Medicare Other

## 2020-10-27 ENCOUNTER — Inpatient Hospital Stay (HOSPITAL_COMMUNITY): Payer: Medicare Other | Admitting: Physical Therapy

## 2020-10-27 DIAGNOSIS — E43 Unspecified severe protein-calorie malnutrition: Secondary | ICD-10-CM | POA: Insufficient documentation

## 2020-10-27 LAB — GLUCOSE, CAPILLARY
Glucose-Capillary: 73 mg/dL (ref 70–99)
Glucose-Capillary: 179 mg/dL — ABNORMAL HIGH (ref 70–99)
Glucose-Capillary: 167 mg/dL — ABNORMAL HIGH (ref 70–99)
Glucose-Capillary: 88 mg/dL (ref 70–99)

## 2020-10-27 NOTE — Progress Notes (Signed)
Occupational Therapy Session Note  Patient Details  Name: Jean Shaffer MRN: [id number] Date of Birth: 09-Feb-1951  Today's Date: 10/27/2020 OT Individual Time: 1300-1345 OT Individual Time Calculation (min): 45 min    Short Term Goals: Week 1:  OT Short Term Goal 1 (Week 1): N/A 2/2 ELOS  Skilled Therapeutic Interventions/Progress Updates:    Pt resting in recliner upon arrival. Initial focus on functional amb with rollator. Pt on 5L O2. Pt amb 201' with Rollator before sitting for rest break. O2 88% and rebounded to 90+ within 60". Pt amb 120' to [employer]. O2 89%. Pt engaged in dynavision activities 1 min and 2 min with extended rest break. Pt amb with Rollator back to room with rest break X 1. Pt remained in bed with all needs within reach and bed alarm activated.   Therapy Documentation Precautions:  Precautions Precautions: Fall Precaution Comments: monitor sats, 2-4L O2 via [place] Restrictions Weight Bearing Restrictions: No   Pain:  Pt denies pain this afternoon  Therapy/Group: Individual Therapy  Leroy Libman 10/27/2020, 1:44 PM

## 2020-10-27 NOTE — Progress Notes (Signed)
Occupational Therapy Session Note  Patient Details  Name: Jean Shaffer MRN: [id number] Date of Birth: 1951-03-24  Today's Date: 10/27/2020 OT Individual Time: 2778-2423 OT Individual Time Calculation (min): 75 min    Short Term Goals: Week 1:  OT Short Term Goal 1 (Week 1): N/A 2/2 ELOS  Skilled Therapeutic Interventions/Progress Updates:    OT intervention with focus on BADL training, safety awareness, activity tolerance, functional amb without AD and with Rollator, and discharge planning. Pt on 4L O2 throughout session.  O2 sats in mid 80s after short periods of activity with rebound to 90+ within 60 secs. Pt amb without AD in room to gather clothing before amb into bathroom for shower. Pt completed bathing/dressing at supervision level. O2 sats as noted above. Pt completed grooming tasks standing at sink. Following rest break, pt amb with RW in hallway approx 50' before requesting w/c. Pt transitioned to ADL apartment. Pt stated she has rollator at home.  Pt practiced with Rollator with no unsafe behaviors noted. Pt amb back to room with rest breaks X 3 (77', 142', and 69'). Pt returned to bed and remained in bed with bed alarm activated. All needs within reach.   Therapy Documentation Precautions:  Precautions Precautions: Fall Precaution Comments: monitor sats, 2-4L O2 via [place] Restrictions Weight Bearing Restrictions: No Pain:  Pt denies pain this morning   Therapy/Group: Individual Therapy  Leroy Libman 10/27/2020, 9:35 AM

## 2020-10-27 NOTE — Progress Notes (Signed)
   10/27/20 1300  Clinical Encounter Type  Visited With Patient  Visit Type Initial  Referral From Nurse  Consult/Referral To Chaplain  Spiritual Encounters  Spiritual Needs Prayer;Emotional  Stress Factors  Patient Stress Factors Health changes  Family Stress Factors None identified  Chaplain visited with patient at patient's request for spiritual care.

## 2020-10-27 NOTE — Progress Notes (Signed)
[place] [facility] PHYSICAL MEDICINE & REHABILITATION PROGRESS NOTE   Subjective/Complaints:   Pt reports much less stuffy nose- doing much better breathing through nose.  Used Flonase 2 sprays yesterday- explained would like her to have ~ 30 seconds between 1st and 2nd spray.   LBM yesterday.      ROS:  Pt denies SOB, abd pain, CP, N/V/C/D, and vision changes   Objective:   No results found. Recent Labs    10/26/20 0610  WBC 12.9*  HGB 10.0*  HCT 31.5*  PLT 238   Recent Labs    10/26/20 0610  NA 138  K 3.8  CL 102  CO2 27  GLUCOSE 81  BUN 21  CREATININE 0.86  CALCIUM 8.8*    Intake/Output Summary (Last 24 hours) at 10/27/2020 4010 Last data filed at 10/27/2020 0859 Gross per 24 hour  Intake 897 ml  Output --  Net 897 ml           Physical Exam: Vital Signs Blood pressure 115/60, pulse 85, temperature 97.8 F (36.6 C), temperature source Oral, resp. rate 20, height 5\' 4"  (1.626 m), weight 59.2 kg, SpO2 96 %.  General: awake, alert, laying in bed, supine, NAD HEENT: Head is normocephalic, wearing 3L O2 by [place]- doesn't sound stuffy now  Neck: Supple without JVD or lymphadenopathy Heart: RRR-  no JVD Chest: good air movement except decreased at bases- CTA B/L- no W/R/R Abdomen: Soft, NT, ND, (+)BS   Extremities: No clubbing, cyanosis, or edema. Pulses are 2+ Skin: IV in arm with small bruise Neuro: Pt is cognitively appropriate with normal insight, memory, and awareness. Cranial nerves 2-12 are intact. Sensory exam is normal. Reflexes are 2+ in all 4's. Fine motor coordination is intact. No tremors. Motor function is grossly 5/5.  Musculoskeletal: Full ROM, No pain with AROM or PROM in the neck, trunk, or extremities. Posture appropriate Psych: pt appropriate- sleepy    Assessment/Plan: 1. Functional deficits secondary to COVID pneumonia/debility which require 3+ hours per day of interdisciplinary therapy in a comprehensive inpatient rehab  setting.  Physiatrist is providing close team supervision and 24 hour management of active medical problems listed below.  Physiatrist and rehab team continue to assess barriers to discharge/monitor patient progress toward functional and medical goals  Care Tool:  Bathing    Body parts bathed by patient: Right arm, Left arm, Chest, Abdomen, Front perineal area, Buttocks, Right upper leg, Left upper leg, Right lower leg, Left lower leg, Face         Bathing assist Assist Level: Contact Guard/Touching assist     Upper Body Dressing/Undressing Upper body dressing   What is the patient wearing?: Pull over shirt    Upper body assist Assist Level: Supervision/Verbal cueing    Lower Body Dressing/Undressing Lower body dressing      What is the patient wearing?: Pants     Lower body assist Assist for lower body dressing: Supervision/Verbal cueing     Toileting Toileting    Toileting assist Assist for toileting: Supervision/Verbal cueing     Transfers Chair/bed transfer  Transfers assist     Chair/bed transfer assist level: Independent     Locomotion Ambulation   Ambulation assist      Assist level: Contact Guard/Touching assist Assistive device: Walker-rolling Max distance: 75   Walk 10 feet activity   Assist     Assist level: Contact Guard/Touching assist Assistive device: Walker-rolling   Walk 50 feet activity   Assist    Assist  level: Contact Guard/Touching assist Assistive device: Walker-rolling    Walk 150 feet activity   Assist Walk 150 feet activity did not occur: Safety/medical concerns         Walk 10 feet on uneven surface  activity   Assist     Assist level: Minimal Assistance - Patient > 75% Assistive device: Aeronautical engineer Will patient use wheelchair at discharge?: No             Wheelchair 50 feet with 2 turns activity    Assist            Wheelchair 150 feet  activity     Assist          Blood pressure 115/60, pulse 85, temperature 97.8 F (36.6 C), temperature source Oral, resp. rate 20, height 5\' 4"  (1.626 m), weight 59.2 kg, SpO2 96 %.    Medical Problem List and Plan: 1.  COVID debility             -patient may shower             -ELOS/Goals: modI in 5-7 days 2.  Antithrombotics: -DVT/anticoagulation:  Pharmaceutical: Lovenox             -antiplatelet therapy: Aspirin 81mg  3. Pain Management: Tylenol as needed- denies pain  11/3- denies pain- con't tylenol  4. Mood: LCSW to follow for evaluation and support             -antipsychotic agents: N/AA 5. Neuropsych: This patient is capable of making decisions on her own behalf. 6. Skin/Wound Care: Routine pressure-relief measures.  On vitamin C, multivitamin and zinc daily. May d/c IV 7. Fluids/Electrolytes/Nutrition: Monitor I's/O.  Encourage fluid intake.  Continue supplements to help promote healing 8.  COPD: Continue to encourage pulmonary hygiene with use of flutter valve.  On prednisone 40 mg p.o. per day.  Wean oxygen as tolerated to keep saturations around 88% or higher.  Continue Breo and Incruse daily.  11/2- will humidify O2 and titrate up some due to increased movement- 3-4L O2 by [place]- and monitor 9.  HTN: Monitor blood pressures 3 times daily.  Continue amlodipine  11/3- BP controlled- con't regimen 10.  Steroid-induced hyperglycemia: Resolving with increase in activity.  Continue monitoring blood sugars AC at bedtime is still on steroids  with sliding scale insulin as needed 11.  Leukocytosis: Monitor for fevers or other signs of infection.  White count improving overall--likely steroid-induced but mildly elevated at baseline per records.   CRP/D-dimer on downward trend.  11/3- WBC down to 12.9 from 15.9- con't monitoring 12.  History of depression: Continue Wellbutrin XL 13.  Insomnia: Continue trazodone at bedtime, which is helping.  14. Nasal stuffiness  11/2- add  Flonase 2 sprays daily since hard ot breath through nose.   11/3 - much improved- con't regimen- use sprays 30 seconds apart.  15. Severe calorie/protein malnutrition due to acute illness  11/3- nutrition is involved in care   LOS: 2 days A FACE TO FACE EVALUATION WAS PERFORMED  Jean Shaffer 10/27/2020, 9:22 AM

## 2020-10-27 NOTE — Plan of Care (Signed)
  Problem: Consults Goal: RH GENERAL PATIENT EDUCATION Description: See Patient Education module for education specifics. Outcome: Progressing Goal: Diabetes Guidelines if Diabetic/Glucose > 140 Description: If diabetic or lab glucose is > 140 mg/dl - Initiate Diabetes/Hyperglycemia Guidelines & Document Interventions  Outcome: Progressing   Problem: RH BLADDER ELIMINATION Goal: RH STG MANAGE BLADDER WITH ASSISTANCE Description: STG Manage Bladder With Mod I Assistance Outcome: Progressing   Problem: RH SAFETY Goal: RH STG ADHERE TO SAFETY PRECAUTIONS W/ASSISTANCE/DEVICE Description: STG Adhere to Safety Precautions With Mod I Assistance/Device. Outcome: Progressing Goal: RH STG DECREASED RISK OF FALL WITH ASSISTANCE Description: STG Decreased Risk of Fall With Mod I Assistance. Outcome: Progressing   Problem: RH KNOWLEDGE DEFICIT GENERAL Goal: RH STG INCREASE KNOWLEDGE OF SELF CARE AFTER HOSPITALIZATION Description: Mod I Outcome: Progressing

## 2020-10-27 NOTE — Progress Notes (Signed)
Physical Therapy Session Note  Patient Details  Name: Jean Shaffer MRN: [id number] Date of Birth: 1951-02-14  Today's Date: 10/27/2020 PT Individual Time: 1015-1105; 6606-0045 PT Individual Time Calculation (min): 50 min and 40 min  Short Term Goals: Week 1:  PT Short Term Goal 1 (Week 1): =LTG due to ELOS  Skilled Therapeutic Interventions/Progress Updates:    Session 1: Pt received sidelying in bed resting, agreeable to PT session. No complaints of pain. Pt on 5L O2 at rest, SpO2 at 93% and higher. Pt is independent for bed mobility. Sit to stand and transfers with rollator at Supervision level throughout session. Pt does require cues for safe rollator brake management. Toilet transfer with Supervision and rollator, independent for pericare and clothing management. Ambulation 4 x 145', 2 x 74' with rollator at Supervision level with seated rest breaks on rollator between bouts of ambulation. Pt initially on 4L O2 via nasal cannula with gait but SpO2 drops to 86% and does not recover with pursed lip breathing. Increased pt to 6L O2 via portable O2 tank and SpO2 returns to 90% (+). SpO2 drops to 88% with activity while on 6L throughout session but quickly recovers to 90% (+). Ascend/descend 4 x 6" stairs with 2 handrails and CGA for balance. Pt requires extended rest break following stair navigation for recovery. Standing tolerance 2 x 5 min with Supervision while performing ball toss to fatigue. Pt left seated in recliner in room with needs in reach at end of session.  Session 2: Pt received seated in bed, agreeable to PT session. No complaints of pain. Bed mobility independent. Sit to stand with Supervision to rollator throughout session. Toilet transfer with Supervision. Ambulation 3 x 145 ft with rollator at Supervision level. Standing endurance 3 x 1 min while performing ball toss against rebounder while standing on airex for balance challenge, Supervision for safety. Pt initially on 4L O2  at rest, SpO2 drops into low 80's with activity, increased to 6L O2 via [place]. SpO2 drops into high 80's with activity while on 6L O2 but quickly recovers to 90% (+) with seated rest break and pursed lip breathing techniques. Pt left seated in recliner in room with needs in reach at end of session.  Therapy Documentation Precautions:  Precautions Precautions: Fall Precaution Comments: monitor sats, 2-4L O2 via No Name Restrictions Weight Bearing Restrictions: No   Therapy/Group: Individual Therapy   Excell Seltzer, PT, DPT  10/27/2020, 5:56 PM

## 2020-10-28 ENCOUNTER — Inpatient Hospital Stay (HOSPITAL_COMMUNITY): Payer: Medicare Other

## 2020-10-28 ENCOUNTER — Inpatient Hospital Stay (HOSPITAL_COMMUNITY): Payer: Medicare Other | Admitting: *Deleted

## 2020-10-28 ENCOUNTER — Inpatient Hospital Stay (HOSPITAL_COMMUNITY): Payer: Medicare Other | Admitting: Physical Therapy

## 2020-10-28 LAB — GLUCOSE, CAPILLARY
Glucose-Capillary: 68 mg/dL — ABNORMAL LOW (ref 70–99)
Glucose-Capillary: 148 mg/dL — ABNORMAL HIGH (ref 70–99)
Glucose-Capillary: 112 mg/dL — ABNORMAL HIGH (ref 70–99)
Glucose-Capillary: 151 mg/dL — ABNORMAL HIGH (ref 70–99)
Glucose-Capillary: 119 mg/dL — ABNORMAL HIGH (ref 70–99)

## 2020-10-28 MED ORDER — TRAMADOL HCL 50 MG PO TABS
50.0000 mg | ORAL_TABLET | Freq: Four times a day (QID) | ORAL | Status: DC | PRN
  Administered 2020-10-28 – 2020-10-31 (×4): 50 mg via ORAL
  Filled 2020-10-28 (×5): qty 1

## 2020-10-28 NOTE — Progress Notes (Signed)
[place] PHYSICAL MEDICINE & REHABILITATION PROGRESS NOTE   Subjective/Complaints:    Pt reports feeling stronger every day- walked further yesterday than day prior as well- at least 200 ft yesterday per description of where she walked.   However required going up to 5L to recover for 12+ hours- still on this AM.   Also had a BG of 68 this AM.   ROS:  Pt denies SOB, abd pain, CP, N/V/C/D, and vision changes   Objective:   No results found. Recent Labs    10/26/20 0610  WBC 12.9*  HGB 10.0*  HCT 31.5*  PLT 238   Recent Labs    10/26/20 0610  NA 138  K 3.8  CL 102  CO2 27  GLUCOSE 81  BUN 21  CREATININE 0.86  CALCIUM 8.8*    Intake/Output Summary (Last 24 hours) at 10/28/2020 0852 Last data filed at 10/28/2020 0700 Gross per 24 hour  Intake 1597 ml  Output --  Net 1597 ml           Physical Exam: Vital Signs Blood pressure 130/69, pulse 90, temperature 98.3 F (36.8 C), temperature source Oral, resp. rate 19, height 5\' 4"  (1.626 m), weight 59.2 kg, SpO2 98 %.  General: awake, just woke up- appropriate, NAD; lying in bed HEENT: Head is normocephalic,on 5L O2 by [place] this AM- not stuffy Neck: Supple without JVD or lymphadenopathy Heart: RRR Chest:  CTA B/L- but decreased at bases B/L- 5L O2 by [place] Abdomen: Soft, NT, ND, (+)BS  Extremities: No clubbing, cyanosis, or edema. Pulses are 2+ Skin: IV in arm with small bruise Neuro: Pt is cognitively appropriate with normal insight, memory, and awareness. Cranial nerves 2-12 are intact. Sensory exam is normal. Reflexes are 2+ in all 4's. Fine motor coordination is intact. No tremors. Motor function is grossly 5/5.  Musculoskeletal: Full ROM, No pain with AROM or PROM in the neck, trunk, or extremities. Posture appropriate Psych: pt appropriate, still sleepy    Assessment/Plan: 1. Functional deficits secondary to COVID pneumonia/debility which require 3+ hours per day of interdisciplinary therapy in a  comprehensive inpatient rehab setting.  Physiatrist is providing close team supervision and 24 hour management of active medical problems listed below.  Physiatrist and rehab team continue to assess barriers to discharge/monitor patient progress toward functional and medical goals  Care Tool:  Bathing    Body parts bathed by patient: Right arm, Left arm, Chest, Abdomen, Front perineal area, Buttocks, Right upper leg, Left upper leg, Right lower leg, Left lower leg, Face         Bathing assist Assist Level: Supervision/Verbal cueing     Upper Body Dressing/Undressing Upper body dressing   What is the patient wearing?: Pull over shirt    Upper body assist Assist Level: Set up assist    Lower Body Dressing/Undressing Lower body dressing      What is the patient wearing?: Pants, Underwear/pull up     Lower body assist Assist for lower body dressing: Supervision/Verbal cueing     Toileting Toileting    Toileting assist Assist for toileting: Supervision/Verbal cueing     Transfers Chair/bed transfer  Transfers assist     Chair/bed transfer assist level: Independent     Locomotion Ambulation   Ambulation assist      Assist level: Contact Guard/Touching assist Assistive device: Walker-rolling Max distance: 75   Walk 10 feet activity   Assist     Assist level: Contact Guard/Touching assist Assistive device:  Walker-rolling   Walk 50 feet activity   Assist    Assist level: Contact Guard/Touching assist Assistive device: Walker-rolling    Walk 150 feet activity   Assist Walk 150 feet activity did not occur: Safety/medical concerns         Walk 10 feet on uneven surface  activity   Assist     Assist level: Minimal Assistance - Patient > 75% Assistive device: Aeronautical engineer Will patient use wheelchair at discharge?: No             Wheelchair 50 feet with 2 turns activity    Assist             Wheelchair 150 feet activity     Assist          Blood pressure 130/69, pulse 90, temperature 98.3 F (36.8 C), temperature source Oral, resp. rate 19, height 5\' 4"  (1.626 m), weight 59.2 kg, SpO2 98 %.    Medical Problem List and Plan: 1.  COVID debility             -patient may shower             -ELOS/Goals: modI in 5-7 days 2.  Antithrombotics: -DVT/anticoagulation:  Pharmaceutical: Lovenox             -antiplatelet therapy: Aspirin 81mg  3. Pain Management: Tylenol as needed- denies pain  11/4- denies pain- con't tylenol prn  4. Mood: LCSW to follow for evaluation and support             -antipsychotic agents: N/AA 5. Neuropsych: This patient is capable of making decisions on her own behalf. 6. Skin/Wound Care: Routine pressure-relief measures.  On vitamin C, multivitamin and zinc daily. May d/c IV 7. Fluids/Electrolytes/Nutrition: Monitor I's/O.  Encourage fluid intake.  Continue supplements to help promote healing 8.  COPD: Continue to encourage pulmonary hygiene with use of flutter valve.  On prednisone 40 mg p.o. per day.  Wean oxygen as tolerated to keep saturations around 88% or higher.  Continue Breo and Incruse daily.  11/2- will humidify O2 and titrate up some due to increased movement- 3-4L O2 by [place]- and monitor  11/4- On 5L O2- will see if can wean down again- was due to over exertion with PT yesterday 9.  HTN: Monitor blood pressures 3 times daily.  Continue amlodipine  11/4- BP controlled- con't regimen 10.  Steroid-induced hyperglycemia: Resolving with increase in activity.  Continue monitoring blood sugars AC at bedtime is still on steroids  with sliding scale insulin as needed 11.  Leukocytosis: Monitor for fevers or other signs of infection.  White count improving overall--likely steroid-induced but mildly elevated at baseline per records.   CRP/D-dimer on downward trend.  11/3- WBC down to 12.9 from 15.9- con't monitoring 12.  History of  depression: Continue Wellbutrin XL 13.  Insomnia: Continue trazodone at bedtime, which is helping.  14. Nasal stuffiness  11/2- add Flonase 2 sprays daily since hard ot breath through nose.   11/3 - much improved- con't regimen- use sprays 30 seconds apart.  11/4- doing great 15. Severe calorie/protein malnutrition due to acute illness  11/3- nutrition is involved in care   LOS: 3 days A FACE TO FACE EVALUATION WAS PERFORMED  Shoni Quijas 10/28/2020, 8:52 AM

## 2020-10-28 NOTE — Progress Notes (Signed)
Occupational Therapy Session Note  Patient Details  Name: Jean Shaffer MRN: [id number] Date of Birth: [birth year]  Today's Date: 10/28/2020 OT Individual Time: 3729-0211 OT Individual Time Calculation (min): 70 min    Short Term Goals: Week 1:  OT Short Term Goal 1 (Week 1): N/A 2/2 ELOS  Skilled Therapeutic Interventions/Progress Updates:    Pt resting in bed upon arrival and ready to get OOB and take a shower. Pt on 5L O2. Pt amb in room without AD to gather clothing and supplies before walking into bathroom to use toilet and transfers to shower. After bathing, pt walked back into room and sat EOB to complete dressing. Pt stood at sink to dry hair with hair dryer. Pt completed all task with supervision and no rest breaks. O2 on 5L at 89S%. Pt amb 33' and 263' with Rollator to [employer]. 2 mins X 3 on Nustep level 3. O2 91%-93% on 6L O2 from portable tank. Pt amb with Rollator to main gym and used rebounder with soccer ball 15x3 with rest breaks. Pt used rebounder with 1kg ball standing on [employer] 15x3. O2 90%. Pt amb with Rollator back to room and walked over to recliner. Pt remained in recliner with all needs within reach. Pt pleased with progress.   Therapy Documentation Precautions:  Precautions Precautions: Fall Precaution Comments: monitor sats, 2-4L O2 via [place] Restrictions Weight Bearing Restrictions: No   Pain:  Pt denies pain this morning   Therapy/Group: Individual Therapy  Leroy Libman 10/28/2020, 12:13 PM

## 2020-10-28 NOTE — Progress Notes (Signed)
Physical Therapy Session Note  Patient Details  Name: Jean Shaffer MRN: [id number] Date of Birth: 03-06-1951  Today's Date: 10/28/2020 PT Individual Time: 0900-1000 PT Individual Time Calculation (min): 60 min   Short Term Goals: Week 1:  PT Short Term Goal 1 (Week 1): =LTG due to ELOS  Skilled Therapeutic Interventions/Progress Updates:    pt received in bed and agreeable to therapy. Pt directed in supine>sit mod I, requested to use restroom, directed in gait training with rollator for 10' to toilet CGA with VC for navigation with O2 tubing with turns to maintain tubing in front of her, PT managed O2 tank. Pt directed in Sit to stand to toilet with rollator and pt demonstrated good technique with break use of rollator, (+) bladder void and I with hygiene. Pt directed in gait training with rollator to sink for hand washing and teeth brushing at [facility] for gait and setup for functional tasks. Pt directed in gait training with rollator 125' to day room at [facility] with one short standing rest break, pt found on 5 L of O2 via [place], and O2 tank only able to complete 4 or 6 L, initial gait session completed at 6L on portable tank, post gait distance O2 monitored to be 88%. Post rest break less than 1 min O2 improved to 93%. Pt directed in x2 gait training sessions of 150' with rollator at [facility] without rest breaks VC for safety with O2 tubing, walker proximity with O2 lowered to 4L. O2 monitored post each rep to be 81% post first additional rep, 1 min rest break and increased to 94%, and 84% post second additional gait rep with 1 min rest break improved to 89%. Pt rested additional min at 4L and O2 increased to 93%. Pt reported no increased feeling of shortness of breath or dizziness and agreeable to continued PT post rest break. Pt then directed in 5x22mins reps on NuStep at level 3, 4L O2, with O2 monitored post each 2 min rep to be over 90% consistently, HR increased appropriately never increasing over 119. Pt  directed in standing activity at rollator with 2# ankle weights for standing marching and hamstring curls x10 each with sitting rest break between activities for improved standing tolerance and balance training. Pt directed in gait to return to room 125' with rollator at [facility] with PT managing O2 tank and pt able to navigate O2 tubing. Pt directed in sit>supine mod I, left in bed, alarm set, All needs in reach and in good condition. Call light in hand.    Therapy Documentation Precautions:  Precautions Precautions: Fall Precaution Comments: monitor sats, 2-4L O2 via [place] Restrictions Weight Bearing Restrictions: No General:   Vital Signs:   Pain:    Therapy/Group: Individual Therapy  [name] 10/28/2020, 1:48 PM

## 2020-10-28 NOTE — Progress Notes (Signed)
Patient resting at interval medicated x1 with Tylenol 650 mg for aching, throbbing discomfort to left forearm. Circumference measurement 23 cm. Left arm elevated on pillows and ice packs applied. Monitor and assisted

## 2020-10-28 NOTE — Progress Notes (Signed)
   10/28/20 1300  What Happened  Was fall witnessed? Yes  Who witnessed fall? Helmi Hechavarria R   Patients activity before fall ambulating-assisted  Point of contact arm/shoulder  Was patient injured? Unsure  Follow Up  MD notified Pam love  Time MD notified 1300  Additional tests Yes-comment (X-rays)  Simple treatment Ice  Progress note created (see row info) Yes  Adult Fall Risk Assessment  Risk Factor Category (scoring not indicated) Fall has occurred during this admission (document High fall risk)  Age 69  Fall History: Fall within 6 months prior to admission 0  Elimination; Bowel and/or Urine Incontinence 0  Elimination; Bowel and/or Urine Urgency/Frequency 0  Medications: includes PCA/Opiates, Anti-convulsants, Anti-hypertensives, Diuretics, Hypnotics, Laxatives, Sedatives, and Psychotropics 0  Patient Care Equipment 1  Mobility-Assistance 2  Mobility-Gait 2  Mobility-Sensory Deficit 0  Altered awareness of immediate physical environment 0  Impulsiveness 0  Lack of understanding of one's physical/cognitive limitations 0  Total Score 6  Patient Fall Risk Level High fall risk  Adult Fall Risk Interventions  Required Bundle Interventions *See Row Information* High fall risk - low, moderate, and high requirements implemented  Additional Interventions Use of appropriate toileting equipment (bedpan, BSC, etc.)  Screening for Fall Injury Risk (To be completed on HIGH fall risk patients) - Assessing Need for Low Bed  Risk For Fall Injury- Low Bed Criteria None identified - Continue screening  Screening for Fall Injury Risk (To be completed on HIGH fall risk patients who do not meet crieteria for Low Bed) - Assessing Need for Floor Mats Only  Risk For Fall Injury- Criteria for Floor Mats None identified - No additional interventions needed  Neurological  Neuro (WDL) WDL  Musculoskeletal  Musculoskeletal (WDL) X  Musculoskeletal Details  Left Elbow Other (Comment) (Soreness with  movement)  Integumentary  Integumentary (WDL) WDL

## 2020-10-28 NOTE — Evaluation (Signed)
Recreational Therapy Assessment and Plan  Patient Details  Name: Jean Shaffer MRN: [id number] Date of Birth: 1951-09-29 Today's Date: 10/28/2020  Rehab Potential:  Good ELOS:   d/c 11/9  Hospital Problem: Principal Problem:   Debility   Past Medical History:      Past Medical History:  Diagnosis Date  . Anemia   . 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 Asthma   . Chronic back pain   . COPD (chronic obstructive pulmonary disease) ([place])    per pt dx in ~2014  . Heart murmur    as a child per pt  . Hypertension   . Hypothyroidism   . Stroke [facility])    per pt "maybe last year or the year before last, they told me I might've had a TIA, but I blacked out"  . Thyroid disease    Past Surgical History:  Past Surgical History:  Procedure Laterality Date  . ABDOMINAL HYSTERECTOMY    . CATARACT EXTRACTION W/ INTRAOCULAR LENS IMPLANT Left 07/14/2014  . CATARACT EXTRACTION W/ INTRAOCULAR LENS IMPLANT Right 10/18/2015  . COLONOSCOPY    . EYE SURGERY    . IR RADIOLOGIST EVAL & MGMT  01/08/2020  . LAPAROSCOPIC APPENDECTOMY N/A 01/29/2020   Procedure: LAPAROSCOPIC  APPENDECTOMY;  Surgeon: Rolm Bookbinder, MD;  Location: [place];  Service: General;  Laterality: N/A;  . MULTIPLE TOOTH EXTRACTIONS  2002  . NECK SURGERY    . SPINE SURGERY    . WISDOM TOOTH EXTRACTION     per pt 01/21/20    Assessment & Plan Clinical Impression: Patient is a 69 y.o. year old female with history of HTN, DDD/OA, chronic LBP with radiculopathy, COPD who was admitted on 09/30/2020 with cough and shortness of breath with hypoxia due to COVID-19. She was treated with remdesivir, baricitinib and steroids with improvement in respiratory status however continued to require high flow supplemental oxygen. Pulmonary hygiene recommended and patient on slow steroid taper. Blood pressure stabilizing therefore amlodipine resumed. She continues to have hypoxia with  activity requiring HF oxygen and limited by debility. CIR recommended due to functional decline. Denies constipation, pain. Lives with her children and grandchildren. Continue to desat significantly with movement. Has steroid-induced leukocytosis and insomnia, the latter has improved with sleep aides. Patient transferred to CIR on 10/25/2020 .   Pt presents with decreased activity tolerance, decreased functional mobility, decreased balance Limiting pt's independence with leisure/community pursuits.   Plan  Min 1 TR session >20 minutes for community reintegration/education during LOS  Recommendations for other services: None   Discharge Criteria: Patient will be discharged from TR if patient refuses treatment 3 consecutive times without medical reason.  If treatment goals not met, if there is a change in medical status, if patient makes no progress towards goals or if patient is discharged from hospital.  The above assessment, treatment plan, treatment alternatives and goals were discussed and mutually agreed upon: by patient  [place] 10/28/2020, 3:22 PM

## 2020-10-28 NOTE — Progress Notes (Addendum)
Patient sustained a fall onto left --tripped and fell over wheel of rollater. She fell onto left shoulder with arm flexed at elbow. Pain with attempts at ROM left elbow. No pain with wrist flexion extension. No pain with palpation left shoulder. Xrays ordered for work up.

## 2020-10-28 NOTE — Progress Notes (Signed)
Occupational Therapy Note  Patient Details  Name: Jean Shaffer MRN: [id number] Date of Birth: [birth year]  Today's Date: 10/28/2020 OT Missed Time: 32 Minutes Missed Time Reason: Other (comment) (pt fell in room earlier and awaiting transport to xray for L shoulder)  Pt missed 45 mins skilled OT services.  Pt resting in bed with ice pack on L shoulder following fall in room earlier. Awaiting transport to xray.  Leotis Shames [facility] 10/28/2020, 2:28 PM

## 2020-10-28 NOTE — IPOC Note (Signed)
Overall Plan of Care [facility]) Patient Details Name: Jean Shaffer MRN: [id number] DOB: 08-29-1951  Admitting Diagnosis: [place] [facility] Problems: Principal Problem:   Debility Active Problems:   Protein-calorie malnutrition, severe     Functional Problem List: Nursing Medication Management, Motor, Safety, Bladder  PT Balance, Safety, Endurance  OT Balance, Endurance, Safety, Sensory  SLP    TR         Basic ADL's: OT Grooming, Bathing, Dressing, Toileting     Advanced  ADL's: OT Simple Meal Preparation, Laundry     Transfers: PT Bed Mobility, Bed to Chair, Musician, Manufacturing systems engineer, Metallurgist: PT Ambulation, Stairs     Additional Impairments: OT None  SLP        TR      Anticipated Outcomes Item Anticipated Outcome  Self Feeding Independent  Swallowing      Basic self-care  Mod I  Toileting  Mod I   Bathroom Transfers Mod I  Bowel/Bladder  Remain continent of bowel/bladder with mod I  Transfers  mod I  Locomotion  mod I  Communication     Cognition     Pain  no pain  Safety/Judgment  Able to call for help and express need   Therapy Plan: PT Intensity: Minimum of 1-2 x/day ,45 to 90 minutes PT Frequency: 5 out of 7 days PT Duration Estimated Length of Stay: 5-7 days OT Intensity: Minimum of 1-2 x/day, 45 to 90 minutes OT Frequency: 5 out of 7 days OT Duration/Estimated Length of Stay: 5-7 days     Due to the current state of emergency, patients may not be receiving their 3-hours of Medicare-mandated therapy.   Team Interventions: Nursing Interventions Patient/Family Education, Bladder Management, Bowel Management, Disease Management/Prevention, Medication Management, Discharge Planning  PT interventions Ambulation/gait training, Balance/vestibular training, Community reintegration, Discharge planning, Disease management/prevention, DME/adaptive equipment instruction, Functional mobility training, Pain management,  Psychosocial support, Splinting/orthotics, Stair training, Patient/family education, Neuromuscular re-education, Therapeutic Exercise, UE/LE Coordination activities, Wheelchair propulsion/positioning, Visual/perceptual remediation/compensation, Therapeutic Activities, UE/LE Strength taining/ROM  OT Interventions Training and development officer, Academic librarian, Discharge planning, DME/adaptive equipment instruction, Functional mobility training, Patient/family education, Self Care/advanced ADL retraining, Therapeutic Activities  SLP Interventions    TR Interventions    SW/CM Interventions Discharge Planning, Psychosocial Support, Patient/Family Education   Barriers to Discharge MD  Medical stability, Home enviroment access/loayout, New diabetic, Lack of/limited family support, Nutritional means and New oxygen  Nursing      PT Decreased caregiver support, Inaccessible home environment, Home environment access/layout, New oxygen, Lack of/limited family support family not home during the day, will have intermittent check ins  OT Decreased caregiver support    SLP      SW Decreased caregiver support, Lack of/limited family support     Team Discharge Planning: Destination: PT-Home ,OT- Home , SLP-  Projected Follow-up: PT-Home health PT, OT-  Home health OT, SLP-  Projected Equipment Needs: PT-To be determined, OT- To be determined, SLP-  Equipment Details: PT- , OT-  Patient/family involved in discharge planning: PT- Patient,  OT-Patient, SLP-   MD ELOS: 5-7 days Medical Rehab Prognosis:  Good Assessment: Pt is a 69 yr old female with hyperglycemia due to steroids, HTN and COPD s/p COVID pneumonia and debility- also requiring 5L O2 currently and nasal stuffiness which affects ability to use O2-  Goals Mod I by d/c.     See Team Conference Notes for weekly updates to the plan of care

## 2020-10-28 NOTE — Progress Notes (Signed)
Hypoglycemic Event  CBG: 68  Treatment: Liquids po   Symptoms: none  Follow-up CBG: [id number] CBG Result:146  Possible Reasons for Event: unk  Comments/MD notified:0730 States will inform Pam for F/Ul    Belva Chimes

## 2020-10-28 NOTE — Progress Notes (Signed)
Patient updated on X ray reports--no fracture. Does have a bruise with hematoma developing on forearm--23 cm circumference. Will use ice as well as ace wrap for compression. Continue to measure bid and record in chart.

## 2020-10-29 ENCOUNTER — Inpatient Hospital Stay (HOSPITAL_COMMUNITY): Payer: Medicare Other | Admitting: Physical Therapy

## 2020-10-29 ENCOUNTER — Inpatient Hospital Stay (HOSPITAL_COMMUNITY): Payer: Medicare Other

## 2020-10-29 LAB — GLUCOSE, CAPILLARY
Glucose-Capillary: 72 mg/dL (ref 70–99)
Glucose-Capillary: 172 mg/dL — ABNORMAL HIGH (ref 70–99)
Glucose-Capillary: 107 mg/dL — ABNORMAL HIGH (ref 70–99)
Glucose-Capillary: 100 mg/dL — ABNORMAL HIGH (ref 70–99)

## 2020-10-29 NOTE — Progress Notes (Signed)
Physical Therapy Session Note  Patient Details  Name: Jean Shaffer MRN: [id number] Date of Birth: June 20, 1951  Today's Date: 10/29/2020 PT Individual Time: 1335-1415 PT Individual Time Calculation (min): 40 min   Short Term Goals: Week 1:  PT Short Term Goal 1 (Week 1): =LTG due to ELOS  Skilled Therapeutic Interventions/Progress Updates:    session focused on functional endurance/activity tolerance and energy conservation during functional ADLs for bathing/dressing and functional mobility. Pt handoff in bathroom from NT and performed mobility in room to gather clothing from drawers and bags including bending over to floor at overall supervision level for safety with management of O2 tubing and positioning of rollator. Pt somewhat impulsive at times during turning and tight space negotiation. Pt performed bathing at sink level per preference this PM including sit <> stands to get bottom at supervision level. Pt able to dress at supervision level including sit <> stands. D/c planning discussed throughout and preparation for home. Functional gait training on unit with rollator with self selected rest breaks for self awareness of limitations (will be alone at home majority of the day) at supervision level. O2 and HR monitored throughout (see below) and maintained on 2L O2 via [place].   Therapy Documentation Precautions:  Precautions Precautions: Fall Precaution Comments: monitor sats, 2-4L O2 via [place] Restrictions Weight Bearing Restrictions: No    Vital Signs: HR = 119 bpm after activity, 5 min of rest to get to 108 bpm (during ADL) 109 bpm with 96% O2 on 2L after gait training  Oxygen Therapy SpO2: 95-96 % O2 Device: Nasal Cannula O2 Flow Rate (L/min): 2 L/min Pain:  Reports pain in her elbow but reports she is still able to utilize the rollator and arm functionally.     Therapy/Group: Individual Therapy  Jean Shaffer, PT, DPT, CBIS  10/29/2020, 3:01 PM

## 2020-10-29 NOTE — Consult Note (Signed)
Reason for Consult:Left elbow pain Referring Physician: M Lovorn  Jean Shaffer is an 69 y.o. female.  HPI: Jean Shaffer fell yesterday trying to get to the bathroom. She landed on her left side and had immediate elbow pain. Workup has been negative but she continues to have significant pain and swelling and orthopedic surgery was consulted. She is RHD and in CIR for Covid related complications.  Past Medical History:  Diagnosis Date  . Anemia   . 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 Asthma   . Chronic back pain   . COPD (chronic obstructive pulmonary disease) ([place])    per pt dx in ~2014  . Heart murmur    as a child per pt  . Hypertension   . Hypothyroidism   . Stroke [facility])    per pt "maybe last year or the year before last, they told me I might've had a TIA, but I blacked out"  . Thyroid disease     Past Surgical History:  Procedure Laterality Date  . ABDOMINAL HYSTERECTOMY    . CATARACT EXTRACTION W/ INTRAOCULAR LENS IMPLANT Left 07/14/2014  . CATARACT EXTRACTION W/ INTRAOCULAR LENS IMPLANT Right 10/18/2015  . COLONOSCOPY    . EYE SURGERY    . IR RADIOLOGIST EVAL & MGMT  01/08/2020  . LAPAROSCOPIC APPENDECTOMY N/A 01/29/2020   Procedure: LAPAROSCOPIC  APPENDECTOMY;  Surgeon: Rolm Bookbinder, MD;  Location: [place];  Service: General;  Laterality: N/A;  . MULTIPLE TOOTH EXTRACTIONS  2002  . NECK SURGERY    . SPINE SURGERY    . WISDOM TOOTH EXTRACTION     per pt 01/21/20    Family History  Problem Relation Age of Onset  . Alcohol abuse Father   . Cancer Father   . Heart disease Father   . Cancer Mother   . Cancer Maternal Grandfather   . Heart disease Maternal Grandfather   . Heart disease Paternal Grandmother   . Diabetes Paternal Grandmother   . Diabetes Paternal Grandfather     Social History:  reports that she has quit smoking. She has never used smokeless tobacco. She reports that she does not drink alcohol and does not use  drugs.  Allergies: No Known Allergies  Medications: I have reviewed the patient's current medications.  Results for orders placed or performed during the hospital encounter of 10/25/20 (from the past 48 hour(s))  Glucose, capillary     Status: Abnormal   Collection Time: 10/27/20 11:48 AM  Result Value Ref Range   Glucose-Capillary 179 (H) 70 - 99 mg/dL    Comment: Glucose reference range applies only to samples taken after fasting for at least 8 hours.  Glucose, capillary     Status: Abnormal   Collection Time: 10/27/20  4:25 PM  Result Value Ref Range   Glucose-Capillary 167 (H) 70 - 99 mg/dL    Comment: Glucose reference range applies only to samples taken after fasting for at least 8 hours.  Glucose, capillary     Status: None   Collection Time: 10/27/20  9:27 PM  Result Value Ref Range   Glucose-Capillary 88 70 - 99 mg/dL    Comment: Glucose reference range applies only to samples taken after fasting for at least 8 hours.  Glucose, capillary     Status: Abnormal   Collection Time: 10/28/20  6:20 AM  Result Value Ref Range   Glucose-Capillary 68 (L) 70 - 99 mg/dL    Comment: Glucose reference  range applies only to samples taken after fasting for at least 8 hours.  Glucose, capillary     Status: Abnormal   Collection Time: 10/28/20  6:55 AM  Result Value Ref Range   Glucose-Capillary 148 (H) 70 - 99 mg/dL    Comment: Glucose reference range applies only to samples taken after fasting for at least 8 hours.  Glucose, capillary     Status: Abnormal   Collection Time: 10/28/20 12:16 PM  Result Value Ref Range   Glucose-Capillary 112 (H) 70 - 99 mg/dL    Comment: Glucose reference range applies only to samples taken after fasting for at least 8 hours.  Glucose, capillary     Status: Abnormal   Collection Time: 10/28/20  4:31 PM  Result Value Ref Range   Glucose-Capillary 151 (H) 70 - 99 mg/dL    Comment: Glucose reference range applies only to samples taken after fasting for at  least 8 hours.  Glucose, capillary     Status: Abnormal   Collection Time: 10/28/20  9:20 PM  Result Value Ref Range   Glucose-Capillary 119 (H) 70 - 99 mg/dL    Comment: Glucose reference range applies only to samples taken after fasting for at least 8 hours.  Glucose, capillary     Status: None   Collection Time: 10/29/20  6:33 AM  Result Value Ref Range   Glucose-Capillary 72 70 - 99 mg/dL    Comment: Glucose reference range applies only to samples taken after fasting for at least 8 hours.    DG Facial Bones 1-2 Views  Result Date: 10/28/2020 CLINICAL DATA:  Recent fall with facial pain, initial encounter EXAM: FACIAL BONES - 1-2 VIEW COMPARISON:  None. FINDINGS: There is no evidence of fracture or other significant bone abnormality. No orbital emphysema or sinus air-fluid levels are seen. IMPRESSION: No acute abnormality noted. Electronically Signed   By: Inez Catalina M.D.   On: 10/28/2020 15:36   DG Elbow 2 Views Left  Result Date: 10/28/2020 CLINICAL DATA:  Recent fall with left elbow pain, initial encounter EXAM: LEFT ELBOW - 2 VIEW COMPARISON:  None. FINDINGS: There is no evidence of fracture, dislocation, or joint effusion. There is no evidence of arthropathy or other focal bone abnormality. Soft tissues are unremarkable. IMPRESSION: No acute abnormality noted. Electronically Signed   By: Inez Catalina M.D.   On: 10/28/2020 15:31   DG Wrist 2 Views Left  Result Date: 10/28/2020 CLINICAL DATA:  Recent fall with left wrist pain, initial encounter EXAM: LEFT WRIST - 2 VIEW COMPARISON:  None. FINDINGS: Mild degenerative changes of the first [facility] joint are noted. No acute fracture or dislocation is noted. No soft tissue abnormality is seen. IMPRESSION: Mild degenerative change without acute abnormality. Electronically Signed   By: Inez Catalina M.D.   On: 10/28/2020 15:25   DG Shoulder Left  Result Date: 10/28/2020 CLINICAL DATA:  Recent fall with left shoulder pain, initial encounter  EXAM: LEFT SHOULDER - 2+ VIEW COMPARISON:  None. FINDINGS: Degenerative changes of the acromioclavicular joint are seen. No acute fracture or dislocation is noted. The underlying bony thorax is within normal limits. Postsurgical changes in the cervical spine are noted. IMPRESSION: No acute abnormality noted. Electronically Signed   By: Inez Catalina M.D.   On: 10/28/2020 15:35    Review of Systems  HENT: Negative for ear discharge, ear pain, hearing loss and tinnitus.   Eyes: Negative for photophobia and pain.  Respiratory: Negative for cough and shortness of  breath.   Cardiovascular: Negative for chest pain.  Gastrointestinal: Negative for abdominal pain, nausea and vomiting.  Genitourinary: Negative for dysuria, flank pain, frequency and urgency.  Musculoskeletal: Positive for arthralgias (Left elbow) and myalgias (Left FA). Negative for back pain and neck pain.  Neurological: Negative for dizziness and headaches.  Hematological: Does not bruise/bleed easily.  Psychiatric/Behavioral: The patient is not nervous/anxious.    Blood pressure 132/60, pulse 86, temperature 97.8 F (36.6 C), resp. rate 16, height 5\' 4"  (1.626 m), weight 59.2 kg, SpO2 98 %. Physical Exam Constitutional:      General: She is not in acute distress.    Appearance: She is well-developed. She is not diaphoretic.  HENT:     Head: Normocephalic and atraumatic.  Eyes:     General: No scleral icterus.       Right eye: No discharge.        Left eye: No discharge.     Conjunctiva/sclera: Conjunctivae normal.  Cardiovascular:     Rate and Rhythm: Normal rate and regular rhythm.  Pulmonary:     Effort: Pulmonary effort is normal. No respiratory distress.  Musculoskeletal:     Cervical back: Normal range of motion.     Comments: Left shoulder, elbow, wrist, digits- no skin wounds, ecchymosis prox volar FA, fullness but no fluctuance underneath, mod TTP medial condyle, mild TTP lat condyle, weakness and pain with  pronation against resistance, no instability, no blocks to motion  Sens  Ax/R/M/U intact  Mot   Ax/ R/ PIN/ M/ AIN/ U intact  Rad 2+  Skin:    General: Skin is warm and dry.  Neurological:     Mental Status: She is alert.  Psychiatric:        Behavior: Behavior normal.     Assessment/Plan: Left elbow pain -- I think this is mostly an intramuscular hematoma with possibly an UCL strain. Will place in sling for comfort. If not improving in a couple of weeks may see Dr. Stann Mainland as outpatient and may need MRI.    Lisette Abu, PA-C Orthopedic Surgery [phone] 10/29/2020, 10:28 AM

## 2020-10-29 NOTE — Progress Notes (Signed)
Physical Therapy Session Note  Patient Details  Name: Jean Shaffer MRN: [id number] Date of Birth: [birth year]  Today's Date: 10/29/2020 PT Individual Time: 1100-1200 and 1445-1530 PT Individual Time Calculation (min): 60 min and 45 mins  Short Term Goals: Week 1:  PT Short Term Goal 1 (Week 1): =LTG due to ELOS  Skilled Therapeutic Interventions/Progress Updates:    session 1: pt received in bed and agreeable to therapy. Pt requested to change her shirt, setup A pt completed task in sitting. Pt requested to use restroom, directed in this with gait to toilet no AD, SBA and I with hygiene. Pt then directed into room, no AD, completed hand washing SBA and required rest break at rollator prior to starting gait outside of room. Pt directed in gait with rollator at SBA on level surfaces on to/off of unit with nursing aware and agreeable, with elevator use SBA, 500'+ with several rest breaks on rollator for support with pt educated on safety of parking rollator against a wall for added support and use of breaks which pt was able to complete, recreational therapist present for session also providing recommendations and safety techniques for community reintegration and home safety. Once returned to unit,  Pt directed in ramp ascending and descending with rollator SBA and no LOB. Pt then directed in simulatied unlevel surface ambulation with rollator, pt required CGA for this as she attempted to force rollator over all small obstacles with max VC to attempt to lift or adjust rollator over obstacles, pt also benefited from education on energy conservation with activity instead of attempting to rush through obstacles then rest, to decrease fall risk. Pt agreed. Pt reported during session she has a step down from bedroom hallway into main floor of home and directed in navigation of rollator with this, pt able to lift rollator unto curb step to simulate going back to bedroom however this exhausted pt and pt  educated on resting at this time point before attempting to complete curb transfer down. Pt took seated rest break, then educated on recommendation to have family assist in rollator and O2 management in the morning before they leave and then pt is able to remain in main floor for all needs. Pt agreed to this and reported this would be easier and agreed safer than her attempting to complete. For curb step down, PT assisted in rollator management down to floor then CGA for descending and min A to ascending. Pt directed in gait training to return to room, one seated rest break grossly SBA. Pt directed into recliner, alarm set, All needs in reach and in good condition. Call light in hand.    Session 2: pt received in recliner and agreeable to therapy. Pt directed in Sit to stand and gait training without AD for 200' CGA, pt instructed on navigation with O2 tubing and to maintain good safety with this throughout session, good effect. Pt directed in dynamic balance training and safety with rollator for obstacles with small ground level obstacles for rollator to go over and pt to step over completed x4 with VC for tech with improved technique compared to this AM session, CGA. Pt directed in nustep 5x56mins level 2 first 4 reps and level 4 final rep, O2 maintained over 02% the entire time at 2L. Pt directed in gait training to return to room, Rolling walker 150' SBA, requested to use restroom, rollator left outside bathroom, SBA no AD to toilet and I with hygiene. Pt directed in hand washing SBA  at sink, and mod I for bed mobility sit>supine. Pt left in bed, alarm set, All needs in reach and in good condition. Call light in hand.    Therapy Documentation Precautions:  Precautions Precautions: Fall Precaution Comments: monitor sats, 2-4L O2 via [place] Restrictions Weight Bearing Restrictions: No General:   Vital Signs: Therapy Vitals Temp: 97.9 F (36.6 C) Temp Source: Oral Pulse Rate: 87 Resp: 18 BP: (!)  111/52 Patient Position (if appropriate): Lying Oxygen Therapy SpO2: 98 % O2 Device: Nasal Cannula O2 Flow Rate (L/min): 2 L/min Pain: Pain Assessment Pain Scale: 0-10 Pain Score: 4  Pain Location: Arm Pain Orientation: Left   Therapy/Group: Individual Therapy  [name] 10/29/2020, 12:40 PM

## 2020-10-29 NOTE — Progress Notes (Signed)
[place] PHYSICAL MEDICINE & REHABILITATION PROGRESS NOTE   Subjective/Complaints:    Said arm really hurts- but doesn't want to take more than tylenol.  On 4L of O2.  Was told hematoma was pressing on nerve and no fx. Fell yesterday over rollator.   Will call Ortho about  L elbow  ROS:  Pt denies SOB, abd pain, CP, N/V/C/D, and vision changes  Objective:   DG Facial Bones 1-2 Views  Result Date: 10/28/2020 CLINICAL DATA:  Recent fall with facial pain, initial encounter EXAM: FACIAL BONES - 1-2 VIEW COMPARISON:  None. FINDINGS: There is no evidence of fracture or other significant bone abnormality. No orbital emphysema or sinus air-fluid levels are seen. IMPRESSION: No acute abnormality noted. Electronically Signed   By: Inez Catalina M.D.   On: 10/28/2020 15:36   DG Elbow 2 Views Left  Result Date: 10/28/2020 CLINICAL DATA:  Recent fall with left elbow pain, initial encounter EXAM: LEFT ELBOW - 2 VIEW COMPARISON:  None. FINDINGS: There is no evidence of fracture, dislocation, or joint effusion. There is no evidence of arthropathy or other focal bone abnormality. Soft tissues are unremarkable. IMPRESSION: No acute abnormality noted. Electronically Signed   By: Inez Catalina M.D.   On: 10/28/2020 15:31   DG Wrist 2 Views Left  Result Date: 10/28/2020 CLINICAL DATA:  Recent fall with left wrist pain, initial encounter EXAM: LEFT WRIST - 2 VIEW COMPARISON:  None. FINDINGS: Mild degenerative changes of the first [facility] joint are noted. No acute fracture or dislocation is noted. No soft tissue abnormality is seen. IMPRESSION: Mild degenerative change without acute abnormality. Electronically Signed   By: Inez Catalina M.D.   On: 10/28/2020 15:25   DG Shoulder Left  Result Date: 10/28/2020 CLINICAL DATA:  Recent fall with left shoulder pain, initial encounter EXAM: LEFT SHOULDER - 2+ VIEW COMPARISON:  None. FINDINGS: Degenerative changes of the acromioclavicular joint are seen. No acute  fracture or dislocation is noted. The underlying bony thorax is within normal limits. Postsurgical changes in the cervical spine are noted. IMPRESSION: No acute abnormality noted. Electronically Signed   By: Inez Catalina M.D.   On: 10/28/2020 15:35   No results for input(s): WBC, HGB, HCT, PLT in the last 72 hours. No results for input(s): NA, K, CL, CO2, GLUCOSE, BUN, CREATININE, CALCIUM in the last 72 hours.  Intake/Output Summary (Last 24 hours) at 10/29/2020 0857 Last data filed at 10/29/2020 0730 Gross per 24 hour  Intake 637 ml  Output --  Net 637 ml           Physical Exam: Vital Signs Blood pressure 132/60, pulse 86, temperature 97.8 F (36.6 C), resp. rate 16, height 5\' 4"  (1.626 m), weight 59.2 kg, SpO2 98 %.  General: sitting up in bed- appropriate, on 4L O2 by [place], NAD HEENT: conjugate gaze; bruise on cheek; O2 4L Neck: Supple without JVD or lymphadenopathy Heart: RRR Chest:  No W/R/R- good air movement excpet at bases, but better- 4L O2 Abdomen: Soft, NT, ND, (+)BS   Extremities: No clubbing, cyanosis, or edema. Pulses are 2+ Skin: IV in arm with small bruise Neuro: Pt is cognitively appropriate with normal insight, memory, and awareness. Cranial nerves 2-12 are intact. Sensory exam is normal. Reflexes are 2+ in all 4's. Fine motor coordination is intact. No tremors. Motor function is grossly 5/5.  Musculoskeletal: Full ROM, No pain with AROM or PROM in the neck, trunk, or extremities. Posture appropriate Psych:  Pt appropriate.  Assessment/Plan: 1. Functional deficits secondary to COVID pneumonia/debility which require 3+ hours per day of interdisciplinary therapy in a comprehensive inpatient rehab setting.  Physiatrist is providing close team supervision and 24 hour management of active medical problems listed below.  Physiatrist and rehab team continue to assess barriers to discharge/monitor patient progress toward functional and medical goals  Care  Tool:  Bathing    Body parts bathed by patient: Right arm, Left arm, Chest, Abdomen, Front perineal area, Buttocks, Right upper leg, Left upper leg, Right lower leg, Left lower leg, Face         Bathing assist Assist Level: Supervision/Verbal cueing     Upper Body Dressing/Undressing Upper body dressing   What is the patient wearing?: Pull over shirt    Upper body assist Assist Level: Independent    Lower Body Dressing/Undressing Lower body dressing      What is the patient wearing?: Pants, Underwear/pull up     Lower body assist Assist for lower body dressing: Supervision/Verbal cueing     Toileting Toileting    Toileting assist Assist for toileting: Supervision/Verbal cueing     Transfers Chair/bed transfer  Transfers assist     Chair/bed transfer assist level: Independent     Locomotion Ambulation   Ambulation assist      Assist level: Contact Guard/Touching assist Assistive device: Walker-rolling Max distance: 75   Walk 10 feet activity   Assist     Assist level: Contact Guard/Touching assist Assistive device: Walker-rolling   Walk 50 feet activity   Assist    Assist level: Contact Guard/Touching assist Assistive device: Walker-rolling    Walk 150 feet activity   Assist Walk 150 feet activity did not occur: Safety/medical concerns         Walk 10 feet on uneven surface  activity   Assist     Assist level: Minimal Assistance - Patient > 75% Assistive device: Aeronautical engineer Will patient use wheelchair at discharge?: No             Wheelchair 50 feet with 2 turns activity    Assist            Wheelchair 150 feet activity     Assist          Blood pressure 132/60, pulse 86, temperature 97.8 F (36.6 C), resp. rate 16, height 5\' 4"  (1.626 m), weight 59.2 kg, SpO2 98 %.    Medical Problem List and Plan: 1.  COVID debility             -patient may shower              -ELOS/Goals: modI in 5-7 days 2.  Antithrombotics: -DVT/anticoagulation:  Pharmaceutical: Lovenox             -antiplatelet therapy: Aspirin 81mg  3. Pain Management: Tylenol as needed- denies pain  11/4- denies pain- con't tylenol prn  11/5- hurt L elbow- holding/posturing with LUE- however doesn't want more than tylenol- if does, can have tramadol prn  4. Mood: LCSW to follow for evaluation and support             -antipsychotic agents: N/AA 5. Neuropsych: This patient is capable of making decisions on her own behalf. 6. Skin/Wound Care: Routine pressure-relief measures.  On vitamin C, multivitamin and zinc daily. May d/c IV 7. Fluids/Electrolytes/Nutrition: Monitor I's/O.  Encourage fluid intake.  Continue supplements to help promote healing 8.  COPD: Continue to encourage pulmonary  hygiene with use of flutter valve.  On prednisone 40 mg p.o. per day.  Wean oxygen as tolerated to keep saturations around 88% or higher.  Continue Breo and Incruse daily.  11/2- will humidify O2 and titrate up some due to increased movement- 3-4L O2 by [place]- and monitor  11/4- On 5L O2- will see if can wean down again- was due to over exertion with PT yesterday  11/5- down to 4L O2- con't to wean 9.  HTN: Monitor blood pressures 3 times daily.  Continue amlodipine  11/5- BP controlled- con't regimen 10.  Steroid-induced hyperglycemia: Resolving with increase in activity.  Continue monitoring blood sugars AC at bedtime is still on steroids  with sliding scale insulin as needed 11.  Leukocytosis: Monitor for fevers or other signs of infection.  White count improving overall--likely steroid-induced but mildly elevated at baseline per records.   CRP/D-dimer on downward trend.  11/3- WBC down to 12.9 from 15.9- con't monitoring 12.  History of depression: Continue Wellbutrin XL 13.  Insomnia: Continue trazodone at bedtime, which is helping.  14. Nasal stuffiness  11/2- add Flonase 2 sprays daily since hard ot breath  through nose.   11/3 - much improved- con't regimen- use sprays 30 seconds apart.  11/4- doing great 15. Severe calorie/protein malnutrition due to acute illness  11/3- nutrition is involved in care 16. Fall with L elbow pain  11/5- will call Ortho to see if has nursemaid's elbow and see if can reduce it.    LOS: 4 days A FACE TO FACE EVALUATION WAS PERFORMED  Tenna Lacko 10/29/2020, 8:57 AM

## 2020-10-29 NOTE — Progress Notes (Signed)
Orthopedic Tech Progress Note Patient Details:  Jean Shaffer 03-07-1951 [id number]  Ortho Devices Type of Ortho Device: Sling immobilizer Ortho Device/Splint Location: LUE Ortho Device/Splint Interventions: Ordered, Adjustment   Post Interventions Patient Tolerated: Well Instructions Provided: Care of device   Janit Pagan 10/29/2020, 5:10 PM

## 2020-10-29 NOTE — Progress Notes (Signed)
Pt educated on use of flutter valve.

## 2020-10-29 NOTE — Progress Notes (Signed)
22.25 cm measured around swollen area of lower left arm.

## 2020-10-29 NOTE — Progress Notes (Signed)
Recreational Therapy Session Note  Patient Details  Name: Jean Shaffer MRN: [id number] Date of Birth: 10/07/1951 Today's Date: 10/29/2020  Pain: c/o soreness in arm and back from fall yesterday, unrated Skilled Therapeutic Interventions/Progress Updates: Session focused on activity tolerance, safety awareness, community mobility using rollator and discharge planning during co-treat with PT.  Pt ambulated throughout the rehab unit and the hospital using rollator with supervision, assistance provided with transport of the oxygen tank.  Pt ambulated ~200' prior to needing seated rest breaks.  Pt oxygen remained at or above 88 % on 2 L spO2 via [place] throughout. Pt navigated through an obstacle course simulating outdoor uneven surfaces, stepping over objects, up and down a ramp with contact guard assist, verbal cues for safety.  Education provided on safety concerns and self monitoring.  Pt stated understanding. Therapy/Group: Co-Treatment  Dewitt Judice 10/29/2020, 12:22 PM

## 2020-10-29 NOTE — Plan of Care (Signed)
  Problem: Consults Goal: RH GENERAL PATIENT EDUCATION Description: See Patient Education module for education specifics. Outcome: Progressing Goal: Diabetes Guidelines if Diabetic/Glucose > 140 Description: If diabetic or lab glucose is > 140 mg/dl - Initiate Diabetes/Hyperglycemia Guidelines & Document Interventions  Outcome: Progressing   Problem: RH BLADDER ELIMINATION Goal: RH STG MANAGE BLADDER WITH ASSISTANCE Description: STG Manage Bladder With Mod I Assistance Outcome: Progressing   Problem: RH SAFETY Goal: RH STG ADHERE TO SAFETY PRECAUTIONS W/ASSISTANCE/DEVICE Description: STG Adhere to Safety Precautions With Mod I Assistance/Device. Outcome: Progressing Goal: RH STG DECREASED RISK OF FALL WITH ASSISTANCE Description: STG Decreased Risk of Fall With Mod I Assistance. Outcome: Progressing   Problem: RH KNOWLEDGE DEFICIT GENERAL Goal: RH STG INCREASE KNOWLEDGE OF SELF CARE AFTER HOSPITALIZATION Description: Mod I Outcome: Progressing

## 2020-10-29 NOTE — Progress Notes (Signed)
Physical Therapy Session Note  Patient Details  Name: Jean Shaffer MRN: [id number] Date of Birth: 04-17-1951  Today's Date: 10/29/2020 PT Individual Time: 8338-2505 PT Individual Time Calculation (min): 38 min   Short Term Goals: Week 1:  PT Short Term Goal 1 (Week 1): =LTG due to ELOS  Skilled Therapeutic Interventions/Progress Updates:    Pt received supine in bed and agreeable to therapy session stating she has had a "great" day. Supine>sitting L EOB, HOB partially elevated, mod-I. Received and maintained on 2L of O2 via nasal cannula during session - SpO2 >90% with moderate activity then 1x decreased to 86% with more intense activity of alterante B LE foot taps on step. HR monitored and increased to 103bpm with moderate activity and 115bpm with more intense. Sit<>stads with supervision for safety during session. Gait ~27ft in/out bathroom, no AD, with CGA/close supervision for safety - pt reports she uses rollator primarily for longer distances as energy conservation. Sit<>stand to/from toilet using UE support on grab bars as needed with supervision. Continent of bladder and bowels - performed seated peri-care without assist. Standing hand hygiene at sink with supervision for balance. Pt reports she was told to wear L UE sling when "up moving around" - Orthopedic MD notes states to wear "for comfort" - educated on how to don/doff and assisted pt to ensure understanding. Pt unable to wear sling during mobility due to need for B UE support on rollator - pt reports earlier today she had some L UE discomfort with use but managed with medications. Gait ~287ft x2 to/from main therapy gym using rollator with close supervision and therapist assisting with O2 tank management - no rest breaks needed.  Performed the following B LE strengthening exercises:  - repeated sit<>stands 2x10 reps with close supervision - standing heel raises x15 reps with B HHA for steadying/balance Dynamic standing balance  task via alternate B LE foot taps on 8" step, no UE support, with CGA/min assist for steadying/balance. Educated pt on fall risk safety in the home and calling 911. At end of session left sitting in recliner with needs in reach, meal tray set-up, and chair alarm on.  Therapy Documentation Precautions:  Precautions Precautions: Fall, Other (comment) Precaution Comments: monitor sats, 2-4L O2 via [place] Required Braces or Orthoses: Sling (L UE sling for comfort) Restrictions Weight Bearing Restrictions: No  Pain:   No reports or indications of pain during session.   Therapy/Group: Individual Therapy  Tawana Scale , PT, DPT, CSRS  10/29/2020, 3:04 PM

## 2020-10-29 NOTE — Progress Notes (Signed)
Patient ID: Jean Shaffer, female   DOB: 05-11-1951, 69 y.o.   MRN: [id number]  SW met with pt in room to discuss d/c recommendations: HHPT/OT, and DME:: rollator and TTB.Pt HHA preference: 1) [facility] or 2) [employer].   SW sent referral to [facility] and waiting on follow-up. SW sent pt DME order to [place] via parachute.   Loralee Pacas, MSW, [place] Office: [phone] Cell: [phone] Fax: [phone]

## 2020-10-30 LAB — GLUCOSE, CAPILLARY
Glucose-Capillary: 75 mg/dL (ref 70–99)
Glucose-Capillary: 115 mg/dL — ABNORMAL HIGH (ref 70–99)
Glucose-Capillary: 142 mg/dL — ABNORMAL HIGH (ref 70–99)
Glucose-Capillary: 69 mg/dL — ABNORMAL LOW (ref 70–99)
Glucose-Capillary: 115 mg/dL — ABNORMAL HIGH (ref 70–99)

## 2020-10-30 NOTE — Progress Notes (Signed)
[place] PHYSICAL MEDICINE & REHABILITATION PROGRESS NOTE   Subjective/Complaints: No complaints this morning. Denies pain, constipation, insomnia.  Says she has family meeting at [place] tomorrow Excited to go home on Tuesday.   ROS:  Pt denies SOB, abd pain, CP, N/V/C/D, and vision changes  Objective:   DG Facial Bones 1-2 Views  Result Date: 10/28/2020 CLINICAL DATA:  Recent fall with facial pain, initial encounter EXAM: FACIAL BONES - 1-2 VIEW COMPARISON:  None. FINDINGS: There is no evidence of fracture or other significant bone abnormality. No orbital emphysema or sinus air-fluid levels are seen. IMPRESSION: No acute abnormality noted. Electronically Signed   By: Inez Catalina M.D.   On: 10/28/2020 15:36   DG Elbow 2 Views Left  Result Date: 10/28/2020 CLINICAL DATA:  Recent fall with left elbow pain, initial encounter EXAM: LEFT ELBOW - 2 VIEW COMPARISON:  None. FINDINGS: There is no evidence of fracture, dislocation, or joint effusion. There is no evidence of arthropathy or other focal bone abnormality. Soft tissues are unremarkable. IMPRESSION: No acute abnormality noted. Electronically Signed   By: Inez Catalina M.D.   On: 10/28/2020 15:31   DG Wrist 2 Views Left  Result Date: 10/28/2020 CLINICAL DATA:  Recent fall with left wrist pain, initial encounter EXAM: LEFT WRIST - 2 VIEW COMPARISON:  None. FINDINGS: Mild degenerative changes of the first [facility] joint are noted. No acute fracture or dislocation is noted. No soft tissue abnormality is seen. IMPRESSION: Mild degenerative change without acute abnormality. Electronically Signed   By: Inez Catalina M.D.   On: 10/28/2020 15:25   DG Shoulder Left  Result Date: 10/28/2020 CLINICAL DATA:  Recent fall with left shoulder pain, initial encounter EXAM: LEFT SHOULDER - 2+ VIEW COMPARISON:  None. FINDINGS: Degenerative changes of the acromioclavicular joint are seen. No acute fracture or dislocation is noted. The underlying bony thorax is  within normal limits. Postsurgical changes in the cervical spine are noted. IMPRESSION: No acute abnormality noted. Electronically Signed   By: Inez Catalina M.D.   On: 10/28/2020 15:35   No results for input(s): WBC, HGB, HCT, PLT in the last 72 hours. No results for input(s): NA, K, CL, CO2, GLUCOSE, BUN, CREATININE, CALCIUM in the last 72 hours.  Intake/Output Summary (Last 24 hours) at 10/30/2020 1008 Last data filed at 10/30/2020 0748 Gross per 24 hour  Intake 1120 ml  Output --  Net 1120 ml           Physical Exam: Vital Signs Blood pressure 140/60, pulse 84, temperature 98.2 F (36.8 C), resp. rate 14, height 5\' 4"  (1.626 m), weight 61.8 kg, SpO2 96 %.   General: Alert, No apparent distress HEENT: Head is normocephalic, On only 9.0W O2! Neck: Supple without JVD or lymphadenopathy Heart: Reg rate and rhythm. No murmurs rubs or gallops Chest: CTA bilaterally without wheezes, rales, or rhonchi; no distress Abdomen: Soft, non-tender, non-distended, bowel sounds positive. Extremities: No clubbing, cyanosis, or edema. Pulses are 2+ Skin: Small bruise at IV site Neuro: Pt is cognitively appropriate with normal insight, memory, and awareness. Cranial nerves 2-12 are intact. Sensory exam is normal. Reflexes are 2+ in all 4's. Fine motor coordination is intact. No tremors. Motor function is grossly 5/5.  Musculoskeletal: Full ROM, No pain with AROM or PROM in the neck, trunk, or extremities. Posture appropriate Psych:  Pt appropriate.     Assessment/Plan: 1. Functional deficits secondary to COVID pneumonia/debility which require 3+ hours per day of interdisciplinary therapy in a comprehensive  inpatient rehab setting.  Physiatrist is providing close team supervision and 24 hour management of active medical problems listed below.  Physiatrist and rehab team continue to assess barriers to discharge/monitor patient progress toward functional and medical goals  Care  Tool:  Bathing    Body parts bathed by patient: Right arm, Left arm, Chest, Abdomen, Front perineal area, Buttocks, Right upper leg, Left upper leg, Right lower leg, Left lower leg, Face         Bathing assist Assist Level: Supervision/Verbal cueing     Upper Body Dressing/Undressing Upper body dressing   What is the patient wearing?: Pull over shirt    Upper body assist Assist Level: Independent    Lower Body Dressing/Undressing Lower body dressing      What is the patient wearing?: Pants, Underwear/pull up     Lower body assist Assist for lower body dressing: Supervision/Verbal cueing     Toileting Toileting    Toileting assist Assist for toileting: Supervision/Verbal cueing     Transfers Chair/bed transfer  Transfers assist     Chair/bed transfer assist level: Supervision/Verbal cueing     Locomotion Ambulation   Ambulation assist      Assist level: Contact Guard/Touching assist Assistive device: Rollator Max distance: 262ft   Walk 10 feet activity   Assist     Assist level: Supervision/Verbal cueing Assistive device: Rollator   Walk 50 feet activity   Assist    Assist level: Contact Guard/Touching assist Assistive device: Rollator    Walk 150 feet activity   Assist Walk 150 feet activity did not occur: Safety/medical concerns  Assist level: Contact Guard/Touching assist Assistive device: Rollator    Walk 10 feet on uneven surface  activity   Assist     Assist level: Minimal Assistance - Patient > 75% Assistive device: Aeronautical engineer Will patient use wheelchair at discharge?: No             Wheelchair 50 feet with 2 turns activity    Assist            Wheelchair 150 feet activity     Assist          Blood pressure 140/60, pulse 84, temperature 98.2 F (36.8 C), resp. rate 14, height 5\' 4"  (1.626 m), weight 61.8 kg, SpO2 96 %.    Medical Problem List and  Plan: 1.  COVID debility             -patient may shower             -ELOS/Goals: modI in 5-7 days  -Continue CIR 2.  Antithrombotics: -DVT/anticoagulation:  Pharmaceutical: Lovenox             -antiplatelet therapy: Aspirin 81mg  3. Pain Management: Tylenol as needed- denies pain  11/4- denies pain- con't tylenol prn  11/5- hurt L elbow- holding/posturing with LUE- however doesn't want more than tylenol- if does, can have tramadol prn   11/6: pain is well controlled 4. Mood: LCSW to follow for evaluation and support             -antipsychotic agents: N/AA 5. Neuropsych: This patient is capable of making decisions on her own behalf. 6. Skin/Wound Care: Routine pressure-relief measures.  On vitamin C, multivitamin and zinc daily. May d/c IV 7. Fluids/Electrolytes/Nutrition: Monitor I's/O.  Encourage fluid intake.  Continue supplements to help promote healing 8.  COPD: Continue to encourage pulmonary hygiene with use of flutter valve.  On prednisone 40 mg p.o. per day.  Wean oxygen as tolerated to keep saturations around 88% or higher.  Continue Breo and Incruse daily.  11/2- will humidify O2 and titrate up some due to increased movement- 3-4L O2 by [place]- and monitor  11/4- On 5L O2- will see if can wean down again- was due to over exertion with PT yesterday  11/5- down to 4L O2- con't to wean  11/6: down to 1.5L! 9.  HTN: Monitor blood pressures 3 times daily.  Continue amlodipine  11/5- BP controlled- con't regimen 10.  Steroid-induced hyperglycemia: Resolving with increase in activity.  Continue monitoring blood sugars AC at bedtime is still on steroids  with sliding scale insulin as needed 11.  Leukocytosis: Monitor for fevers or other signs of infection.  White count improving overall--likely steroid-induced but mildly elevated at baseline per records.   CRP/D-dimer on downward trend.  11/3- WBC down to 12.9 from 15.9- con't monitoring 12.  History of depression: Continue Wellbutrin  XL 13.  Insomnia: Continue trazodone at bedtime, which is helping.  14. Nasal stuffiness  11/2- add Flonase 2 sprays daily since hard ot breath through nose.   11/3 - much improved- con't regimen- use sprays 30 seconds apart.  11/4- doing great 15. Severe calorie/protein malnutrition due to acute illness  11/3- nutrition is involved in care 16. Fall with L elbow pain  11/5- will call Ortho to see if has nursemaid's elbow and see if can reduce it. 17. Disposition: Patient states family meeting tomorrow 10:00, I will try to be present as her son may have some questions.    LOS: 5 days A FACE TO FACE EVALUATION WAS PERFORMED  Benett Swoyer P Oluwaseun Cremer 10/30/2020, 10:08 AM

## 2020-10-31 ENCOUNTER — Inpatient Hospital Stay (HOSPITAL_COMMUNITY): Payer: Medicare Other | Admitting: Physical Therapy

## 2020-10-31 ENCOUNTER — Encounter (HOSPITAL_COMMUNITY): Payer: Medicare Other | Admitting: Occupational Therapy

## 2020-10-31 ENCOUNTER — Ambulatory Visit (HOSPITAL_COMMUNITY): Payer: Medicare Other | Admitting: Physical Therapy

## 2020-10-31 LAB — GLUCOSE, CAPILLARY: Glucose-Capillary: 72 mg/dL (ref 70–99)

## 2020-10-31 MED ORDER — MELATONIN 3 MG PO TABS
3.0000 mg | ORAL_TABLET | Freq: Every day | ORAL | Status: DC
  Administered 2020-10-31 – 2020-11-01 (×2): 3 mg via ORAL
  Filled 2020-10-31 (×2): qty 1

## 2020-10-31 NOTE — Progress Notes (Addendum)
[place] PHYSICAL MEDICINE & REHABILITATION PROGRESS NOTE   Subjective/Complaints: No complaints this morning Woke up at 3am with diffuse muscular pains and received tramadol which helped. Slept poorly due to this pain-agreeable to melatonin tonight.   ROS: Pt denies SOB, abd pain, CP, N/V/C/D, and vision changes  Objective:   No results found. No results for input(s): WBC, HGB, HCT, PLT in the last 72 hours. No results for input(s): NA, K, CL, CO2, GLUCOSE, BUN, CREATININE, CALCIUM in the last 72 hours.  Intake/Output Summary (Last 24 hours) at 10/31/2020 0906 Last data filed at 10/31/2020 0847 Gross per 24 hour  Intake 648 ml  Output --  Net 648 ml     Physical Exam: Vital Signs Blood pressure 136/72, pulse 85, temperature 98.4 F (36.9 C), resp. rate 15, height 5\' 4"  (1.626 m), weight 63.5 kg, SpO2 96 %. General: Alert and oriented x 3, No apparent distress HEENT: Head is normocephalic, [place] 7.1G Neck: Supple without JVD or lymphadenopathy Heart: Reg rate and rhythm. No murmurs rubs or gallops Chest: CTA bilaterally without wheezes, rales, or rhonchi; no distress Abdomen: Soft, non-tender, non-distended, bowel sounds positive. Extremities: No clubbing, cyanosis, or edema. Pulses are 2+  Skin: Everything stable  Neuro: Pt is cognitively appropriate with normal insight, memory, and awareness. Cranial nerves 2-12 are intact. Sensory exam is normal. Reflexes are 2+ in all 4's. Fine motor coordination is intact. No tremors. Motor function is grossly 5/5.  Musculoskeletal: Full ROM, No pain with AROM or PROM in the neck, trunk, or extremities. Posture appropriate Psych:  Pt appropriate.     Assessment/Plan: 1. Functional deficits secondary to COVID pneumonia/debility which require 3+ hours per day of interdisciplinary therapy in a comprehensive inpatient rehab setting.  Physiatrist is providing close team supervision and 24 hour management of active medical problems listed  below.  Physiatrist and rehab team continue to assess barriers to discharge/monitor patient progress toward functional and medical goals  Care Tool:  Bathing    Body parts bathed by patient: Right arm, Left arm, Chest, Abdomen, Front perineal area, Buttocks, Right upper leg, Left upper leg, Right lower leg, Left lower leg, Face         Bathing assist Assist Level: Supervision/Verbal cueing     Upper Body Dressing/Undressing Upper body dressing   What is the patient wearing?: Pull over shirt    Upper body assist Assist Level: Independent    Lower Body Dressing/Undressing Lower body dressing      What is the patient wearing?: Pants, Underwear/pull up     Lower body assist Assist for lower body dressing: Supervision/Verbal cueing     Toileting Toileting    Toileting assist Assist for toileting: Supervision/Verbal cueing     Transfers Chair/bed transfer  Transfers assist     Chair/bed transfer assist level: Supervision/Verbal cueing     Locomotion Ambulation   Ambulation assist      Assist level: Contact Guard/Touching assist Assistive device: Rollator Max distance: 283ft   Walk 10 feet activity   Assist     Assist level: Supervision/Verbal cueing Assistive device: Rollator   Walk 50 feet activity   Assist    Assist level: Contact Guard/Touching assist Assistive device: Rollator    Walk 150 feet activity   Assist Walk 150 feet activity did not occur: Safety/medical concerns  Assist level: Contact Guard/Touching assist Assistive device: Rollator    Walk 10 feet on uneven surface  activity   Assist     Assist level: Minimal  Assistance - Patient > 75% Assistive device: Aeronautical engineer Will patient use wheelchair at discharge?: No             Wheelchair 50 feet with 2 turns activity    Assist            Wheelchair 150 feet activity     Assist          Blood pressure  136/72, pulse 85, temperature 98.4 F (36.9 C), resp. rate 15, height 5\' 4"  (1.626 m), weight 63.5 kg, SpO2 96 %.    Medical Problem List and Plan: 1.  COVID debility             -patient may shower             -ELOS/Goals: modI in 5-7 days  -Continue CIR 2.  Antithrombotics: -DVT/anticoagulation:  Pharmaceutical: Lovenox             -antiplatelet therapy: Aspirin 81mg  3. Pain Management: Tylenol as needed- denies pain  11/4- denies pain- con't tylenol prn  11/5- hurt L elbow- holding/posturing with LUE- however doesn't want more than tylenol- if does, can have tramadol prn   11/7: received Tramadol last night for diffuse pains.  4. Mood: LCSW to follow for evaluation and support             -antipsychotic agents: N/AA 5. Neuropsych: This patient is capable of making decisions on her own behalf. 6. Skin/Wound Care: Routine pressure-relief measures.  On vitamin C, multivitamin and zinc daily. IV d/ced 7. Fluids/Electrolytes/Nutrition: Monitor I's/O.  Encourage fluid intake.  Continue supplements to help promote healing 8.  COPD: Continue to encourage pulmonary hygiene with use of flutter valve.  On prednisone 40 mg p.o. per day.  Wean oxygen as tolerated to keep saturations around 88% or higher.  Continue Breo and Incruse daily.  11/2- will humidify O2 and titrate up some due to increased movement- 3-4L O2 by [place]- and monitor  11/4- On 5L O2- will see if can wean down again- was due to over exertion with PT yesterday  11/5- down to 4L O2- con't to wean  11/7: down to 1.5L 9.  HTN: Monitor blood pressures 3 times daily.  Continue amlodipine  11/5- BP controlled- con't regimen 10.  Steroid-induced hyperglycemia: Resolving with increase in activity.  Continue monitoring blood sugars AC at bedtime is still on steroids  with sliding scale insulin as needed 11.  Leukocytosis: Monitor for fevers or other signs of infection.  White count improving overall--likely steroid-induced but mildly  elevated at baseline per records.   CRP/D-dimer on downward trend.  11/3- WBC down to 12.9 from 15.9- con't monitoring 12.  History of depression: Continue Wellbutrin XL 13.  Insomnia: Continue trazodone at bedtime, which is helping.  14. Nasal stuffiness  11/2- add Flonase 2 sprays daily since hard ot breath through nose.   11/3 - much improved- con't regimen- use sprays 30 seconds apart.  11/4- doing great 15. Severe calorie/protein malnutrition due to acute illness  11/3- nutrition is involved in care 16. Fall with L elbow pain  11/5- will call Ortho to see if has nursemaid's elbow and see if can reduce it. 17. Disposition: Patient has caregiver training today, plan for d/c home on tuesday   LOS: 6 days A FACE TO FACE EVALUATION WAS PERFORMED  Jean Shaffer P Juri Dinning 10/31/2020, 9:06 AM

## 2020-10-31 NOTE — Progress Notes (Signed)
Physical Therapy Session Note  Patient Details  Name: Jean Shaffer MRN: [id number] Date of Birth: Apr 30, 1951  Today's Date: 10/31/2020 PT Individual Time: 1100-1200; 1440-1540 PT Individual Time Calculation (min): 60 min and 60 min  Short Term Goals: Week 1:  PT Short Term Goal 1 (Week 1): =LTG due to ELOS  Skilled Therapeutic Interventions/Progress Updates:    Session 1: Pt received seated in recliner in room with daughter in law present for family education session. No complaints of pain. Pt performs all transfers at Supervision to mod I level throughout session. Ambulation up to 150 ft with rollator at Supervision level, cues for safe brake management and parking next to the wall. Reviewed home setup and creating a clear path for ambulation throughout the home to decrease fall risk. Reviewed how to manage rollator up/down curb step with pt's daughter in law performing return demo of rollator management. Car transfer with Supervision and rollator with cues for safe transfer technique. Sit to/from supine on real bed to simulate home environment at mod I level with increased time needed due to fatigue. Short distance gait with no AD and Supervision to simulate navigation through tight spaces and shorter distances in the home when use of a rollator may not be appropriate. Pt and daughter in law demonstrate good understanding of pt's current level of function and expected overall mod I level upon d/c hoe. Nustep level 5, 2 x 5 min with use of B UE/LE for global endurance training. Toilet transfer with Supervision. Pt left seated in recliner in room with needs in reach at end of session. Pt on 2L O2 via [place] throughout session, SpO2 remains at 90% (+) with activity throughout session.  Session 2: Pt received seated in recliner in room, agreeable to PT session. No complaints of pain. Pt transfers at Supervision with both with and without use of rollator this session. Ambulation into bathroom with no AD  and Supervision. Toilet transfer with Supervision, independent for clothing management and pericare. Ambulation 3 x 150 ft with rollator at Supervision level during session with seated rest breaks on rollator as determined by patient fatigue level. Session focus on standing balance and coordination activities. Ambulation with rollator through obstacle course navigating around cones, on/off airex, and up/down 6" curb step with rollator at close Supervision to CGA level. Sidesteps L/R with no AD and Supervision progressing to side steps with alt L/R cone taps and CGA for balance. [place] limits of stability performing multidirectional weight shift with use of ankle and hip strategies following visual cueing: level 2 with BUE support and SBA for balance (scores 36%, 37%), level 1 with no UE support and CGA for balance (scores 19%, 13%). Ascend/descend 4 x 6" steps then 8 x 6" steps with 2 handrails, step-to gait pattern, at Supervision level. Pt exhibits improved tolerance for navigating stairs this date. Pt on 2L O2 via [place] throughout session, SpO2 remains at 93% and higher with activity. Pt left seated in bed with needs in reach, bed alarm in place at end of session.  Therapy Documentation Precautions:  Precautions Precautions: Fall, Other (comment) Precaution Comments: monitor sats, 2-4L O2 via [place] Required Braces or Orthoses: Sling (L UE sling for comfort) Restrictions Weight Bearing Restrictions: No   Therapy/Group: Individual Therapy   Excell Seltzer, PT, DPT  10/31/2020, 12:14 PM

## 2020-10-31 NOTE — Progress Notes (Signed)
Occupational Therapy Session Note  Patient Details  Name: Jean Shaffer MRN: [id number] Date of Birth: November 05, 1951  Today's Date: 10/31/2020 OT Individual Time: 1001-1058 OT Individual Time Calculation (min): 57 min   Short Term Goals: Week 1:  OT Short Term Goal 1 (Week 1): N/A 2/2 ELOS    Skilled Therapeutic Interventions/Progress Updates:    Pt greeted in the recliner with no c/o pain. Family member [name] present for family education. Pt declined showering today, therefore started session by using the restroom. Pt able to complete transfer and toileting tasks at Mod I level while self managing her 02, ambulating without AD. After handwashing at the sink, pt was agreeable to practice shower transfers. She used the rollator to ambulate to the tub shower room, able to remember the way independently as she had practiced this transfer with primary therapist before. Discussed purchasing nonslip shower treads, curtain placement, and to keep the door ajar for ventilation for showering at home. Pt required 2 seated rest breaks in the hallway before making it back to the room after. Issued pt and [name] with an energy conservation handout pertaining to ADL/IADL participation. Reviewed principles together and functional implications during daily routine. Educated [place] of pts Mod I goals and intermittent supervision expectation from family. [name] taking notes, asking appropriate questions, and exhibited carryover of education given teach back strategies. Both pt [name] understanding f/u HHOT and TTB DME for home. Pt remained in the recliner at end of session, all needs within reach, satting at 96%, awaiting next therapist for family ed.   02 assessed post all bouts of ambulation today with sats 92-96% on 2L. Orpah Cobb to purchase a portable pulse ox for home use.    Therapy Documentation Precautions:  Precautions Precautions: Fall, Other (comment) Precaution Comments: monitor sats, 2-4L O2 via  [place] Required Braces or Orthoses: Sling (L UE sling for comfort) Restrictions Weight Bearing Restrictions: No Pain: Pain Assessment Pain Scale: 0-10 Pain Score: 5  Pain Type: Acute pain Pain Location: Arm Pain Orientation: Left Pain Descriptors / Indicators: Aching Pain Intervention(s): Medication (See eMAR) (tramadol given) ADL: ADL Eating: Set up Where Assessed-Eating: Bed level Grooming: Supervision/safety Where Assessed-Grooming: Sitting at sink Upper Body Bathing: Supervision/safety Where Assessed-Upper Body Bathing: Shower Lower Body Bathing: Contact guard Where Assessed-Lower Body Bathing: Shower Upper Body Dressing: Supervision/safety Where Assessed-Upper Body Dressing: Edge of bed Lower Body Dressing: Contact guard Where Assessed-Lower Body Dressing: Edge of bed Toileting: Contact guard Where Assessed-Toileting: Glass blower/designer: Therapist, music Method: Product/process development scientist Method: Heritage manager: Radio broadcast assistant ADL Comments: Desat with bathing/dressing and grooming seated at sink level on 2-4L O2 via [place]      Therapy/Group: Individual Therapy  Keylah Darwish A Rigel Filsinger 10/31/2020, 12:38 PM

## 2020-10-31 NOTE — Significant Event (Signed)
Hypoglycemic Event  CBG: 69  Treatment: 8 oz juice/soda  Symptoms: None  Follow-up CBG: Time:2135 CBG Result:115  Possible Reasons for Event: Unknown  Comments/MD notified:    Jeananne Rama

## 2020-11-01 ENCOUNTER — Inpatient Hospital Stay (HOSPITAL_COMMUNITY): Payer: Medicare Other

## 2020-11-01 ENCOUNTER — Inpatient Hospital Stay (HOSPITAL_COMMUNITY): Payer: Medicare Other | Admitting: Physical Therapy

## 2020-11-01 ENCOUNTER — Inpatient Hospital Stay (HOSPITAL_COMMUNITY): Payer: Medicare Other | Admitting: Occupational Therapy

## 2020-11-01 LAB — CBC
HCT: 31.1 % — ABNORMAL LOW (ref 36.0–46.0)
Hemoglobin: 9.8 g/dL — ABNORMAL LOW (ref 12.0–15.0)
MCH: 28.6 pg (ref 26.0–34.0)
MCHC: 31.5 g/dL (ref 30.0–36.0)
MCV: 90.7 fL (ref 80.0–100.0)
Platelets: 208 10*3/uL (ref 150–400)
RBC: 3.43 MIL/uL — ABNORMAL LOW (ref 3.87–5.11)
RDW: 15.3 % (ref 11.5–15.5)
WBC: 11 10*3/uL — ABNORMAL HIGH (ref 4.0–10.5)
nRBC: 0 % (ref 0.0–0.2)

## 2020-11-01 LAB — BASIC METABOLIC PANEL
Anion gap: 9 (ref 5–15)
BUN: 16 mg/dL (ref 8–23)
CO2: 27 mmol/L (ref 22–32)
Calcium: 9 mg/dL (ref 8.9–10.3)
Chloride: 103 mmol/L (ref 98–111)
Creatinine, Ser: 0.9 mg/dL (ref 0.44–1.00)
GFR, Estimated: >60 mL/min (ref >60)
Glucose, Bld: 121 mg/dL — ABNORMAL HIGH (ref 70–99)
Potassium: 3.6 mmol/L (ref 3.5–5.1)
Sodium: 139 mmol/L (ref 135–145)

## 2020-11-01 MED ORDER — POLYETHYLENE GLYCOL 3350 17 G PO PACK
17.0000 g | PACK | Freq: Once | ORAL | Status: AC
  Administered 2020-11-01: 17 g via ORAL
  Filled 2020-11-01: qty 1

## 2020-11-01 NOTE — Progress Notes (Signed)
Physical Therapy Session Note  Patient Details  Name: YOSELINE ANDERSSON MRN: [id number] Date of Birth: Jul 25, 1951  Today's Date: 11/01/2020 PT Individual Time: 1100-1200 PT Individual Time Calculation (min): 60 min   Short Term Goals: Week 1:  PT Short Term Goal 1 (Week 1): =LTG due to ELOS  Skilled Therapeutic Interventions/Progress Updates:    Pt received seated in recliner in room, agreeable to PT session. Pt reports some soreness in LUE, assisted pt with donning sling for improved support of UE. Pt transfers at mod I level throughout session with increased time and intermittent use of rollator. Ambulation up to 250 ft with rollator at mod I level before requiring a seated rest break. Ascend/descend ramp and ambulation across uneven ground with rollator at Supervision level with cues needed for safety. Pt able to retrieve objects from floor safely with use of reacher and good adherence to locking brakes on rollator prior to retrieving objects. Demonstrated how to perform safe floor transfer, pt able to perform return demo of floor transfer independently. Discussed fall assessment and pt demos good understanding. After discussion with pt's primary OT pt determined to be safe at mod I level in her room. Pt left seated in recliner in room with needs in reach at end of session. Pt on 2L O2 with activity throughout session, SpO2 drops to 89% but quickly recovers to 90% (+) with seated rest break and pursed lip breathing techniques.  Therapy Documentation Precautions:  Precautions Precautions: Fall, Other (comment) Precaution Comments: monitor sats, 2L O2 via [place] Required Braces or Orthoses: Sling (LUE sling for comfort) Restrictions Weight Bearing Restrictions: No   Therapy/Group: Individual Therapy   Excell Seltzer, PT, DPT  11/01/2020, 12:17 PM

## 2020-11-01 NOTE — Progress Notes (Signed)
Physical Therapy Note  Patient Details  Name: Jean Shaffer MRN: [id number] Date of Birth: [birth year] Today's Date: 11/01/2020   Patient saturations on room air at rest = 84% Patient saturations on room air while ambulating = 88% Patient saturations on 2 Liters of oxygen while ambulating = 93%  Briefly explain why patient needs home oxygen: Pt requires home oxygen in order to maintain SpO2 at 90% or higher at rest and with activity.   Excell Seltzer, PT, DPT  11/01/2020, 7:49 AM

## 2020-11-01 NOTE — Progress Notes (Signed)
[facility] PHYSICAL MEDICINE & REHABILITATION PROGRESS NOTE   Subjective/Complaints:  Pt reports feeling better this AM- did walk test without O2- dropped a lot- on 2L this AM- needs to have BM- ordered miralax since bloated and needs to "go".   ROS:  Pt denies SOB, abd pain, CP, N/V/C/D, and vision changes    Objective:   No results found. Recent Labs    11/01/20 0813  WBC 11.0*  HGB 9.8*  HCT 31.1*  PLT 208   Recent Labs    11/01/20 0813  NA 139  K 3.6  CL 103  CO2 27  GLUCOSE 121*  BUN 16  CREATININE 0.90  CALCIUM 9.0    Intake/Output Summary (Last 24 hours) at 11/01/2020 1652 Last data filed at 10/31/2020 1821 Gross per 24 hour  Intake 177 ml  Output --  Net 177 ml     Physical Exam: Vital Signs Blood pressure 132/64, pulse 98, temperature 98.4 F (36.9 C), temperature source Oral, resp. rate 17, height 5\' 4"  (1.626 m), weight 64.3 kg, SpO2 93 %. General: alert, sitting up in bedside chair, NAD HEENT: O2 by [place] 2L Neck: Supple without JVD or lymphadenopathy Heart: RRR-  Chest: CTA B/L- no W/R/R- decreased at bases B/L Abdomen: Soft, NT, (+)BS - hypoactive- slightly bloated Extremities: No clubbing, cyanosis, or edema. Pulses are 2+  Skin: Everything stable  Neuro: Pt is cognitively appropriate with normal insight, memory, and awareness. Cranial nerves 2-12 are intact. Sensory exam is normal. Reflexes are 2+ in all 4's. Fine motor coordination is intact. No tremors. Motor function is grossly 5/5.  Musculoskeletal: Full ROM, No pain with AROM or PROM in the neck, trunk, or extremities. Posture appropriate Psych:  Pt appropriate.     Assessment/Plan: 1. Functional deficits secondary to COVID pneumonia/debility which require 3+ hours per day of interdisciplinary therapy in a comprehensive inpatient rehab setting.  Physiatrist is providing close team supervision and 24 hour management of active medical problems listed below.  Physiatrist and rehab  team continue to assess barriers to discharge/monitor patient progress toward functional and medical goals  Care Tool:  Bathing    Body parts bathed by patient: Right arm, Left arm, Chest, Abdomen, Front perineal area, Buttocks, Right upper leg, Left upper leg, Right lower leg, Left lower leg, Face         Bathing assist Assist Level: Independent with assistive device     Upper Body Dressing/Undressing Upper body dressing   What is the patient wearing?: Pull over shirt    Upper body assist Assist Level: Independent    Lower Body Dressing/Undressing Lower body dressing      What is the patient wearing?: Pants, Underwear/pull up     Lower body assist Assist for lower body dressing: Independent with assitive device     Toileting Toileting    Toileting assist Assist for toileting: Independent with assistive device     Transfers Chair/bed transfer  Transfers assist     Chair/bed transfer assist level: Independent with assistive device Chair/bed transfer assistive device: Other (rollator)   Locomotion Ambulation   Ambulation assist      Assist level: Independent with assistive device Assistive device: Rollator Max distance: 250'   Walk 10 feet activity   Assist     Assist level: Independent with assistive device Assistive device: Rollator   Walk 50 feet activity   Assist    Assist level: Independent with assistive device Assistive device: Rollator    Walk 150 feet activity  Assist Walk 150 feet activity did not occur: Safety/medical concerns  Assist level: Independent with assistive device Assistive device: Rollator    Walk 10 feet on uneven surface  activity   Assist     Assist level: Supervision/Verbal cueing Assistive device: Rollator   Wheelchair     Assist Will patient use wheelchair at discharge?: No             Wheelchair 50 feet with 2 turns activity    Assist            Wheelchair 150 feet  activity     Assist          Blood pressure 132/64, pulse 98, temperature 98.4 F (36.9 C), temperature source Oral, resp. rate 17, height 5\' 4"  (1.626 m), weight 64.3 kg, SpO2 93 %.    Medical Problem List and Plan: 1.  COVID debility             -patient may shower             -ELOS/Goals: modI in 5-7 days  -Continue CIR 2.  Antithrombotics: -DVT/anticoagulation:  Pharmaceutical: Lovenox             -antiplatelet therapy: Aspirin 81mg  3. Pain Management: Tylenol as needed- denies pain  11/4- denies pain- con't tylenol prn  11/5- hurt L elbow- holding/posturing with LUE- however doesn't want more than tylenol- if does, can have tramadol prn   11/7: received Tramadol last night for diffuse pains.  4. Mood: LCSW to follow for evaluation and support             -antipsychotic agents: N/AA 5. Neuropsych: This patient is capable of making decisions on her own behalf. 6. Skin/Wound Care: Routine pressure-relief measures.  On vitamin C, multivitamin and zinc daily. IV d/ced 7. Fluids/Electrolytes/Nutrition: Monitor I's/O.  Encourage fluid intake.  Continue supplements to help promote healing 8.  COPD: Continue to encourage pulmonary hygiene with use of flutter valve.  On prednisone 40 mg p.o. per day.  Wean oxygen as tolerated to keep saturations around 88% or higher.  Continue Breo and Incruse daily.  11/2- will humidify O2 and titrate up some due to increased movement- 3-4L O2 by [place]- and monitor  11/4- On 5L O2- will see if can wean down again- was due to over exertion with PT yesterday  11/5- down to 4L O2- con't to wean  11/7: down to 1.5L  11/8- On 2L of O2- did walk test to verify needs O2 at d/c.  9.  HTN: Monitor blood pressures 3 times daily.  Continue amlodipine  11/5- BP controlled- con't regimen 10.  Steroid-induced hyperglycemia: Resolving with increase in activity.  Continue monitoring blood sugars AC at bedtime is still on steroids  with sliding scale insulin as  needed 11.  Leukocytosis: Monitor for fevers or other signs of infection.  White count improving overall--likely steroid-induced but mildly elevated at baseline per records.   CRP/D-dimer on downward trend.  11/3- WBC down to 12.9 from 15.9- con't monitoring  11/8- WBC down to 11k- heading in right direction 12.  History of depression: Continue Wellbutrin XL 13.  Insomnia: Continue trazodone at bedtime, which is helping.  14. Nasal stuffiness  11/2- add Flonase 2 sprays daily since hard ot breath through nose.   11/3 - much improved- con't regimen- use sprays 30 seconds apart.  11/4- doing great 15. Severe calorie/protein malnutrition due to acute illness  11/3- nutrition is involved in care 16. Fall with L  elbow pain  11/5- will call Ortho to see if has nursemaid's elbow and see if can reduce it.  11/8- put in sling for comofort- prn- UCL strain with IM hematoma- if not improving, needs to see Dr Stann Mainland in 2 weeks after d/c 17. Disposition: Patient has caregiver training today, plan for d/c home on tuesday   LOS: 7 days A FACE TO FACE EVALUATION WAS PERFORMED  Jean Shaffer 11/01/2020, 4:52 PM

## 2020-11-01 NOTE — Progress Notes (Signed)
Occupational Therapy Session Note  Patient Details  Name: Jean Shaffer MRN: [id number] Date of Birth: 08-01-1951  Today's Date: 11/01/2020 OT Individual Time: 4132-4401 OT Individual Time Calculation (min): 70 min    Short Term Goals: Week 1:  OT Short Term Goal 1 (Week 1): N/A 2/2 ELOS  Skilled Therapeutic Interventions/Progress Updates:    OT interventin with focus on bathing at shower level, dressing with sit<>stand from chair, functional amb with/without AD, dynamic standing balance, activity tolerance, discharge planning, and safety awareness to prepare for discharge home tomorrow.  Pt amb with room without AD to gather supplies and walk into bathroom. Pt managed O2 tubing safely.  Pt transferred to shower after using toilet. Pt completed all bathing/dressing tasks at mod I. Pt stood at sink to brush teeth and blow dry hair. O2 after ADLs >90% on 2L O2. Pt amb with Rollator to Day Room and used NuStep for 7 mins (level 5 BLE only). O2 >90% on 2L O2. Pt amb to therapy gym and rested before using Biodex for balance. O2 87% on 2L O2 but rebounded in less then 30 seconds. Pt completed 2 activities on Biodex. Pt noted with difficulty controlling weight shifts and frequently would overshoot target. O2 88% on 2L O2. After resting, pt amb with Rollator to room. O2 84% on 2L O2 but rebounded to >90% in less then 30 seconds. All activities and amb with mod I. Pt takes rest breaks appropriately. Pt returned to recliner. Pt remained in recliner with all needs within reach and seat alarm activated.   Therapy Documentation Precautions:  Precautions Precautions: Fall, Other (comment) Precaution Comments: monitor sats, 2-4L O2 via [place] Required Braces or Orthoses: Sling (L UE sling for comfort) Restrictions Weight Bearing Restrictions: No Pain: Pain Assessment Pain Scale: 0-10 Pain Score: 5  Pain Type: Acute pain Pain Location: Arm Pain Orientation: Left Pain Radiating Towards: shoulder and  elbow Pain Descriptors / Indicators: Aching Pain Onset: With Activity Pain Intervention(s): RN admin meds and repositioned  Therapy/Group: Individual Therapy  Leroy Libman 11/01/2020, 9:30 AM

## 2020-11-01 NOTE — Progress Notes (Signed)
Occupational Therapy Session Note  Patient Details  Name: Jean Shaffer MRN: [id number] Date of Birth: 16-Jan-1951  Today's Date: 11/01/2020 OT Individual Time: 1300-1400 OT Individual Time Calculation (min): 60 min   Short Term Goals: Week 1:  OT Short Term Goal 1 (Week 1): N/A 2/2 ELOS  Skilled Therapeutic Interventions/Progress Updates:    Pt greeted in the recliner with no c/o pain, ok with seeing OT earlier than scheduled. ADL needs were met, therefore transitioned to IADL retraining in the ADL apartment. Pt used the rollator to ambulate there. Discussed energy conservation strategies to implement during meal prep including placement of rollator to enable her to sit as needed (she plans to use the rollator during kitchen mobility). Advised for her to place 1 pot and pan on the burners to avoid stooping to retrieve pots/pans. She reported her other needed items in the kitchen were shoulder level or slightly above. Pt practiced simulated meal prep using the microwave and stovetop, pt doing well with locking her rollator at appropriate times and using the rollator to transport plate and "meal" items. Continued education regarding importance of structuring rest best breaks before fatigue set in. 02 sats remained 97% during kitchen mobility today. She returned to the room using rollator and then practiced bedmaking without device. She will have the concentrator at home, so practiced engaging in IADL activity with extended chord to simulate this setup. Vcs once again for building in rest breaks and to sit vs stand to don pillowcases. Pt only needed assistance to don fitted sheet, did very well with self managing her extended 02 chord. At end of session pt remained sitting in the recliner with all needs within reach, Mod I sign on the door, satting at 97% on 2L.   Therapy Documentation Precautions:  Precautions Precautions: Fall, Other (comment) Precaution Comments: monitor sats, 2L O2 via  [place] Required Braces or Orthoses: Sling (LUE sling for comfort) Restrictions Weight Bearing Restrictions: No Vital Signs: Therapy Vitals Temp: 98.4 F (36.9 C) Temp Source: Oral Pulse Rate: 98 Resp: 17 BP: 132/64 Patient Position (if appropriate): Sitting Oxygen Therapy SpO2: 93 % O2 Device: Nasal Cannula ADL: ADL Eating: Set up Where Assessed-Eating: Bed level Grooming: Supervision/safety Where Assessed-Grooming: Sitting at sink Upper Body Bathing: Supervision/safety Where Assessed-Upper Body Bathing: Shower Lower Body Bathing: Contact guard Where Assessed-Lower Body Bathing: Shower Upper Body Dressing: Supervision/safety Where Assessed-Upper Body Dressing: Edge of bed Lower Body Dressing: Contact guard Where Assessed-Lower Body Dressing: Edge of bed Toileting: Contact guard Where Assessed-Toileting: Glass blower/designer: Therapist, music Method: Product/process development scientist Method: Heritage manager: Radio broadcast assistant ADL Comments: Desat with bathing/dressing and grooming seated at sink level on 2-4L O2 via [place]     Therapy/Group: Individual Therapy  Edman Lipsey A Emanuel Dowson 11/01/2020, 3:42 PM

## 2020-11-01 NOTE — Progress Notes (Signed)
Occupational Therapy Discharge Summary  Patient Details  Name: Jean Shaffer MRN: [id number] Date of Birth: 09-27-1951  Patient has met 8 of 8 long term goals due to improved activity tolerance, improved balance, ability to compensate for deficits, improved awareness and improved coordination.  Pt made excellent progress with BADLs and IADLs. Pt completed all activities/tasks with mod I. Pt amb in room and at household level without AD but uses Rollator for amb in hallway and in community setting. Pt manages O2 tubing safely at household level. Pt takes rest breaks appropriately. Pt currently on 2L O2. Pt's family has been present for therapy sessions. Patient to discharge at overall Modified Independent level.  Patient's family member [name] attended family education to provide the necessary assistance at discharge.  Recommendation:  Patient will benefit from ongoing skilled OT services in home health setting to continue to advance functional skills in the area of iADL.  Equipment: TTB  Reasons for discharge: treatment goals met and discharge from hospital  Patient/family agrees with progress made and goals achieved: Yes  OT Discharge Vision Baseline Vision/History: Wears glasses Wears Glasses: Reading only Patient Visual Report: No change from baseline Vision Assessment?: No apparent visual deficits Perception  Perception: Within Functional Limits Praxis Praxis: Intact Cognition Overall Cognitive Status: Within Functional Limits for tasks assessed Arousal/Alertness: Awake/alert Orientation Level: Oriented X4 Attention: Selective Selective Attention: Appears intact Memory: Appears intact Awareness: Appears intact Problem Solving: Appears intact Safety/Judgment: Appears intact Sensation Sensation Light Touch: Impaired Detail Peripheral sensation comments: Numbness in L leg x several months. Patient reports MRI (+) bulging disc and scar tissue pressing against nerves in her  back. Light Touch Impaired Details: Impaired LLE Coordination Gross Motor Movements are Fluid and Coordinated: Yes Fine Motor Movements are Fluid and Coordinated: Yes Motor  Motor Motor: Within Functional Limits   Trunk/Postural Assessment  Cervical Assessment Cervical Assessment:  (forward head) Thoracic Assessment Thoracic Assessment:  (rounded shoulders) Lumbar Assessment Lumbar Assessment:  (posterior pelvic tilt)   Extremity/Trunk Assessment RUE Assessment RUE Assessment: Within Functional Limits LUE Assessment LUE Assessment: Within Functional Limits   Leroy Libman 11/01/2020, 9:34 AM

## 2020-11-01 NOTE — Progress Notes (Signed)
Physical Therapy Note  Patient Details  Name: Jean Shaffer MRN: [id number] Date of Birth: 1951/09/24 Today's Date: 11/01/2020    Patient saturations on room air at rest = 88% Patient saturations on room air while ambulating = 79% Patient saturations on 2 Liters of oxygen while ambulating = 90%  Briefly explain why patient needs home oxygen: Pt requires oxygen to maintain SpO2 at 90% and higher with activity.    Excell Seltzer, PT, DPT  11/01/2020, 12:08 PM

## 2020-11-01 NOTE — Progress Notes (Signed)
Patient ID: Jean Shaffer, female   DOB: Sep 17, 1951, 69 y.o.   MRN: [id number]  SW ordered home o2 for patient. Tank will be delivered at d/c.  SW met with pt in room to inform on above. SW confirms DME delivery: rollator and TTB. Pt aware that an oxygen tank will be delivered to the room prior to d/c and will be set up for home o2.  Loralee Pacas, MSW, [place] Office: [phone] Cell: [phone] Fax: [phone]

## 2020-11-01 NOTE — Plan of Care (Signed)
  Problem: RH Simple Meal Prep Goal: LTG Patient will perform simple meal prep w/assist (OT) Description: LTG: Patient will perform simple meal prep with assistance, with/without cues (OT). Outcome: Completed/Met

## 2020-11-01 NOTE — Progress Notes (Signed)
Physical Therapy Discharge Summary  Patient Details  Name: Jean Shaffer MRN: [id number] Date of Birth: 11-07-1951  Today's Date: 11/01/2020 PT Individual Time: 1100-1200 PT Individual Time Calculation (min): 60 min    Patient has met 7 of 8 long term goals due to improved activity tolerance, improved balance, improved postural control, increased strength and ability to compensate for deficits.  Patient to discharge at an ambulatory level Modified Independent.   Patient's care partner is independent to provide the necessary physical assistance at discharge. Pt's daughter in law has completed family education and is safe to assist pt upon d/c home.  Reasons goals not met: Pt did not meet mod I level for stairs but family is able to provide assistance needed for curb/step navigation.  Recommendation:  Patient will benefit from ongoing skilled PT services in home health setting to continue to advance safe functional mobility, address ongoing impairments in endurance, strength, safety, balance, independence with functional mobility, oxygen needs, and minimize fall risk.  Equipment: rollator  Reasons for discharge: treatment goals met and discharge from hospital  Patient/family agrees with progress made and goals achieved: Yes  PT Discharge Precautions/Restrictions Precautions Precautions: Fall;Other (comment) Precaution Comments: monitor sats, 2L O2 via [place] Required Braces or Orthoses: Sling (LUE sling for comfort) Restrictions Weight Bearing Restrictions: No Vision/Perception  Perception Perception: Within Functional Limits Praxis Praxis: Intact  Cognition Overall Cognitive Status: Within Functional Limits for tasks assessed Arousal/Alertness: Awake/alert Orientation Level: Oriented X4 Attention: Selective Selective Attention: Appears intact Memory: Appears intact Awareness: Appears intact Problem Solving: Appears intact Safety/Judgment: Appears  intact Sensation Sensation Light Touch: Impaired Detail Peripheral sensation comments: Numbness in L leg x several months. Patient reports MRI (+) bulging disc and scar tissue pressing against nerves in her back. Light Touch Impaired Details: Impaired LLE (distally) Proprioception: Appears Intact Coordination Gross Motor Movements are Fluid and Coordinated: Yes Fine Motor Movements are Fluid and Coordinated: Yes Motor  Motor Motor: Within Functional Limits Motor - Discharge Observations: [facility]  Mobility Bed Mobility Bed Mobility: Rolling Right;Rolling Left;Supine to Sit;Sit to Supine Rolling Right: Independent Rolling Left: Independent Supine to Sit: Independent Sitting - Scoot to Edge of Bed: Independent Sit to Supine: Independent Scooting to HOB: Independent Transfers Transfers: Sit to Stand;Stand to [employer] Transfers Sit to Stand: Independent with assistive device Stand to Sit: Independent with assistive device Stand Pivot Transfers: Set up assist Transfer (Assistive device): 4-wheeled walker Locomotion  Gait Ambulation: Yes Gait Assistance: Independent with assistive device Gait Distance (Feet): 250 Feet Assistive device: 4-wheeled walker Gait Gait: Yes Gait Pattern: Within Functional Limits Gait Pattern: Within Functional Limits Gait velocity: decreased Stairs / Additional Locomotion Stairs: Yes Stairs Assistance: Supervision/Verbal cueing Stair Management Technique: Two rails;Step to pattern Number of Stairs: 8 Height of Stairs: 6 Ramp: Supervision/Verbal cueing Curb: Supervision/Verbal cueing Wheelchair Mobility Wheelchair Mobility: No  Trunk/Postural Assessment  Cervical Assessment Cervical Assessment: Exceptions to [facility] (forward head) Thoracic Assessment Thoracic Assessment: Exceptions to [facility] (mild kyphosis) Lumbar Assessment Lumbar Assessment: Exceptions to [facility] (posterior pelvic tilt) Postural Control Postural Control: Within Functional Limits   Balance Balance Balance Assessed: Yes Static Standing Balance Static Standing - Balance Support: No upper extremity supported;During functional activity Static Standing - Level of Assistance: 6: Modified independent (Device/Increase time) Dynamic Standing Balance Dynamic Standing - Balance Support: No upper extremity supported;During functional activity Dynamic Standing - Level of Assistance: 6: Modified independent (Device/Increase time) Extremity Assessment  RUE Assessment RUE Assessment: Within Functional Limits LUE Assessment LUE Assessment: Within Functional Limits RLE  Assessment RLE Assessment: Within Functional Limits General Strength Comments: 4 to 4+/5 LLE Assessment LLE Assessment: Within Functional Limits General Strength Comments: 4+ to 5/5     Excell Seltzer, PT, DPT 11/01/2020, 12:16 PM

## 2020-11-02 DIAGNOSIS — G4700 Insomnia, unspecified: Secondary | ICD-10-CM

## 2020-11-02 DIAGNOSIS — R0600 Dyspnea, unspecified: Secondary | ICD-10-CM

## 2020-11-02 DIAGNOSIS — S53449A Ulnar collateral ligament sprain of unspecified elbow, initial encounter: Secondary | ICD-10-CM

## 2020-11-02 DIAGNOSIS — F411 Generalized anxiety disorder: Secondary | ICD-10-CM

## 2020-11-02 DIAGNOSIS — R0602 Shortness of breath: Secondary | ICD-10-CM

## 2020-11-02 MED ORDER — FLUTICASONE PROPIONATE 50 MCG/ACT NA SUSP: 2.0000 | Freq: Every day | NASAL | 2 refills | Status: DC

## 2020-11-02 MED ORDER — PANTOPRAZOLE SODIUM 40 MG PO TBEC: 40.0000 mg | DELAYED_RELEASE_TABLET | Freq: Every day | ORAL | 0 refills | Status: DC

## 2020-11-02 MED ORDER — SENNOSIDES-DOCUSATE SODIUM 8.6-50 MG PO TABS: 2.0000 | ORAL_TABLET | Freq: Every day | ORAL | 0 refills | Status: DC

## 2020-11-02 MED ORDER — MELATONIN 3 MG PO TABS: 3.0000 mg | ORAL_TABLET | Freq: Every day | ORAL | 0 refills | Status: DC

## 2020-11-02 MED ORDER — TRAMADOL HCL 50 MG PO TABS: 50.0000 mg | ORAL_TABLET | Freq: Four times a day (QID) | ORAL | 0 refills | Status: DC | PRN

## 2020-11-02 MED ORDER — PREDNISONE 10 MG PO TABS: ORAL_TABLET | ORAL | 0 refills | Status: DC

## 2020-11-02 MED ORDER — HYDROXYZINE HCL 25 MG PO TABS: 25.0000 mg | ORAL_TABLET | Freq: Three times a day (TID) | ORAL | 0 refills | Status: DC | PRN

## 2020-11-02 NOTE — Progress Notes (Signed)
[facility] PHYSICAL MEDICINE & REHABILITATION PROGRESS NOTE   Subjective/Complaints:  Says feeling great- ready for d/c today.  On 2L O2 and no complaints.  D/w pt- can schedule my f/u PRN- if she needs me.   ROS:  Pt denies SOB, abd pain, CP, N/V/C/D, and vision changes   Objective:   No results found. Recent Labs    11/01/20 0813  WBC 11.0*  HGB 9.8*  HCT 31.1*  PLT 208   Recent Labs    11/01/20 0813  NA 139  K 3.6  CL 103  CO2 27  GLUCOSE 121*  BUN 16  CREATININE 0.90  CALCIUM 9.0    Intake/Output Summary (Last 24 hours) at 11/02/2020 0839 Last data filed at 11/01/2020 2100 Gross per 24 hour  Intake 538 ml  Output --  Net 538 ml     Physical Exam: Vital Signs Blood pressure 140/75, pulse 85, temperature 97.8 F (36.6 C), temperature source Oral, resp. rate 16, height 5\' 4"  (1.626 m), weight 62.8 kg, SpO2 98 %. General: alert, sitting up in bed; wearing O2 by [place] 2L, NAD HEENT: O2 by [place] 2L- no change Neck: Supple without JVD or lymphadenopathy Heart: RRR  Chest: CTA B/L- no W/R/R- good air movement Abdomen: Soft, NT, ND, (+)BS  Extremities: No clubbing, cyanosis, or edema. Pulses are 2+  Skin: Everything stable  Neuro: Pt is cognitively appropriate with normal insight, memory, and awareness. Cranial nerves 2-12 are intact. Sensory exam is normal. Reflexes are 2+ in all 4's. Fine motor coordination is intact. No tremors. Motor function is grossly 5/5.  Musculoskeletal: Full ROM, No pain with AROM or PROM in the neck, trunk, or extremities. Posture appropriate Psych:  Pt appropriate.     Assessment/Plan: 1. Functional deficits secondary to COVID pneumonia/debility which require 3+ hours per day of interdisciplinary therapy in a comprehensive inpatient rehab setting.  Physiatrist is providing close team supervision and 24 hour management of active medical problems listed below.  Physiatrist and rehab team continue to assess barriers to  discharge/monitor patient progress toward functional and medical goals  Care Tool:  Bathing    Body parts bathed by patient: Right arm, Left arm, Chest, Abdomen, Front perineal area, Buttocks, Right upper leg, Left upper leg, Right lower leg, Left lower leg, Face         Bathing assist Assist Level: Independent with assistive device     Upper Body Dressing/Undressing Upper body dressing   What is the patient wearing?: Pull over shirt    Upper body assist Assist Level: Independent    Lower Body Dressing/Undressing Lower body dressing      What is the patient wearing?: Pants, Underwear/pull up     Lower body assist Assist for lower body dressing: Independent with assitive device     Toileting Toileting    Toileting assist Assist for toileting: Independent with assistive device     Transfers Chair/bed transfer  Transfers assist     Chair/bed transfer assist level: Independent with assistive device Chair/bed transfer assistive device: Other (rollator)   Locomotion Ambulation   Ambulation assist      Assist level: Independent with assistive device Assistive device: Rollator Max distance: 250'   Walk 10 feet activity   Assist     Assist level: Independent with assistive device Assistive device: Rollator   Walk 50 feet activity   Assist    Assist level: Independent with assistive device Assistive device: Rollator    Walk 150 feet activity  Assist Walk 150 feet activity did not occur: Safety/medical concerns  Assist level: Independent with assistive device Assistive device: Rollator    Walk 10 feet on uneven surface  activity   Assist     Assist level: Supervision/Verbal cueing Assistive device: Rollator   Wheelchair     Assist Will patient use wheelchair at discharge?: No             Wheelchair 50 feet with 2 turns activity    Assist            Wheelchair 150 feet activity     Assist           Blood pressure 140/75, pulse 85, temperature 97.8 F (36.6 C), temperature source Oral, resp. rate 16, height 5\' 4"  (1.626 m), weight 62.8 kg, SpO2 98 %.    Medical Problem List and Plan: 1.  COVID debility             -patient may shower             -ELOS/Goals: modI in 5-7 days  -Continue CIR 2.  Antithrombotics: -DVT/anticoagulation:  Pharmaceutical: Lovenox             -antiplatelet therapy: Aspirin 81mg  3. Pain Management: Tylenol as needed- denies pain  11/4- denies pain- con't tylenol prn  11/5- hurt L elbow- holding/posturing with LUE- however doesn't want more than tylenol- if does, can have tramadol prn   11/7: received Tramadol last night for diffuse pains.  11/9- pain controlled- con't regimen  4. Mood: LCSW to follow for evaluation and support             -antipsychotic agents: N/AA 5. Neuropsych: This patient is capable of making decisions on her own behalf. 6. Skin/Wound Care: Routine pressure-relief measures.  On vitamin C, multivitamin and zinc daily. IV d/ced 7. Fluids/Electrolytes/Nutrition: Monitor I's/O.  Encourage fluid intake.  Continue supplements to help promote healing 8.  COPD: Continue to encourage pulmonary hygiene with use of flutter valve.  On prednisone 40 mg p.o. per day.  Wean oxygen as tolerated to keep saturations around 88% or higher.  Continue Breo and Incruse daily.  11/2- will humidify O2 and titrate up some due to increased movement- 3-4L O2 by [place]- and monitor  11/4- On 5L O2- will see if can wean down again- was due to over exertion with PT yesterday  11/5- down to 4L O2- con't to wean  11/7: down to 1.5L  11/8- On 2L of O2- did walk test to verify needs O2 at d/c.   11/9- going home on 2L O2 for now 9.  HTN: Monitor blood pressures 3 times daily.  Continue amlodipine  11/5- BP controlled- con't regimen 10.  Steroid-induced hyperglycemia: Resolving with increase in activity.  Continue monitoring blood sugars AC at bedtime is still on  steroids  with sliding scale insulin as needed 11.  Leukocytosis: Monitor for fevers or other signs of infection.  White count improving overall--likely steroid-induced but mildly elevated at baseline per records.   CRP/D-dimer on downward trend.  11/3- WBC down to 12.9 from 15.9- con't monitoring  11/8- WBC down to 11k- heading in right direction 12.  History of depression: Continue Wellbutrin XL 13.  Insomnia: Continue trazodone at bedtime, which is helping.  14. Nasal stuffiness  11/2- add Flonase 2 sprays daily since hard ot breath through nose.   11/3 - much improved- con't regimen- use sprays 30 seconds apart.  11/4- doing great 15. Severe calorie/protein malnutrition  due to acute illness  11/3- nutrition is involved in care 16. Fall with L elbow pain  11/5- will call Ortho to see if has nursemaid's elbow and see if can reduce it.  11/8- put in sling for comofort- prn- UCL strain with IM hematoma- if not improving, needs to see Dr Stann Mainland in 2 weeks after d/c 17. Disposition: Patient has caregiver training today, plan for d/c home on tuesday  11/9- can f/u up me PRN  LOS: 8 days A FACE TO FACE EVALUATION WAS PERFORMED  Ayleah Hofmeister 11/02/2020, 8:39 AM

## 2020-11-02 NOTE — Discharge Summary (Signed)
Physician Discharge Summary  Patient ID: Jean Shaffer MRN: [id number] DOB/AGE: June 20, 1951 69 y.o.  Admit date: 10/25/2020 Discharge date: 11/02/2020  Discharge Diagnoses:  Principal Problem:   Debility Active Problems:   COPD (chronic obstructive pulmonary disease) (HCC)   Protein-calorie malnutrition, severe   Persistent shortness of breath after COVID-19   Insomnia   Anxiety reaction   Sprain of ulnar collateral ligament   Discharged Condition: stable   Significant Diagnostic Studies: DG Facial Bones 1-2 Views  Result Date: 10/28/2020 CLINICAL DATA:  Recent fall with facial pain, initial encounter EXAM: FACIAL BONES - 1-2 VIEW COMPARISON:  None. FINDINGS: There is no evidence of fracture or other significant bone abnormality. No orbital emphysema or sinus air-fluid levels are seen. IMPRESSION: No acute abnormality noted. Electronically Signed   By: Inez Catalina M.D.   On: 10/28/2020 15:36   DG Elbow 2 Views Left  Result Date: 10/28/2020 CLINICAL DATA:  Recent fall with left elbow pain, initial encounter EXAM: LEFT ELBOW - 2 VIEW COMPARISON:  None. FINDINGS: There is no evidence of fracture, dislocation, or joint effusion. There is no evidence of arthropathy or other focal bone abnormality. Soft tissues are unremarkable. IMPRESSION: No acute abnormality noted. Electronically Signed   By: Inez Catalina M.D.   On: 10/28/2020 15:31   DG Wrist 2 Views Left  Result Date: 10/28/2020 CLINICAL DATA:  Recent fall with left wrist pain, initial encounter EXAM: LEFT WRIST - 2 VIEW COMPARISON:  None. FINDINGS: Mild degenerative changes of the first [facility] joint are noted. No acute fracture or dislocation is noted. No soft tissue abnormality is seen. IMPRESSION: Mild degenerative change without acute abnormality. Electronically Signed   By: Inez Catalina M.D.   On: 10/28/2020 15:25    DG Shoulder Left  Result Date: 10/28/2020 CLINICAL DATA:  Recent fall with left shoulder pain, initial encounter  EXAM: LEFT SHOULDER - 2+ VIEW COMPARISON:  None. FINDINGS: Degenerative changes of the acromioclavicular joint are seen. No acute fracture or dislocation is noted. The underlying bony thorax is within normal limits. Postsurgical changes in the cervical spine are noted. IMPRESSION: No acute abnormality noted. Electronically Signed   By: Inez Catalina M.D.   On: 10/28/2020 15:35    Labs:  Basic Metabolic Panel: BMP Latest Ref Rng & Units 11/01/2020 10/26/2020 10/22/2020  Glucose 70 - 99 mg/dL 121(H) 81 79  BUN 8 - 23 mg/dL 16 21 21   Creatinine 0.44 - 1.00 mg/dL 0.90 0.86 0.94  Sodium 135 - 145 mmol/L 139 138 138  Potassium 3.5 - 5.1 mmol/L 3.6 3.8 4.4  Chloride 98 - 111 mmol/L 103 102 102  CO2 22 - 32 mmol/L 27 27 29   Calcium 8.9 - 10.3 mg/dL 9.0 8.8(L) 8.4(L)    CBC: CBC Latest Ref Rng & Units 11/01/2020 10/26/2020 10/22/2020  WBC 4.0 - 10.5 K/uL 11.0(H) 12.9(H) 15.9(H)  Hemoglobin 12.0 - 15.0 g/dL 9.8(L) 10.0(L) 10.0(L)  Hematocrit 36 - 46 % 31.1(L) 31.5(L) 31.1(L)  Platelets 150 - 400 K/uL 208 238 232    CBG: Recent Labs  Lab 10/30/20 1132 10/30/20 1632 10/30/20 2103 10/30/20 2135 10/31/20 0618  GLUCAP 115* 142* 69* 115* 72    Brief HPI:   Jean Shaffer is a 69 y.o. female with history of HTN, DDD, COPD who was admitted on 09/30/2020 with cough and shortness of breath with hypoxia due to COVID-19 PNA.  She was treated with remdesivir, varus med and steroids with improvement in respiratory status however continued to require  high flow oxygen.  Pulmonary hygiene was recommended and patient was placed on slow steroid taper.  Blood pressures were improving therefore amlodipine was resumed.  She continued to have debility as well as hypoxia with minimal activity and CIR was recommended due to functional decline.   Hospital Course: Jean Shaffer was admitted to rehab 10/25/2020 for inpatient therapies to consist of PT, ST and OT at least three hours five days a week. Past admission  physiatrist, therapy team and rehab RN have worked together to provide customized collaborative inpatient rehab.  Her blood pressures were monitored on TID basis and has been stable on low-dose amlodipine.  She was started on slow taper of steroids and is tolerating this without worsening of respiratory status.  Her endurance is improved, dyspnea has resolved but she continues to have hypoxia with all activity and requires 2 L oxygen per [place].  She has been educated on importance of pulmonary hygiene as well as use of inhalers after discharge.    Her anxiety has been managed with as needed use of Vistaril at night.  Melatonin also used in addition to trazodone to help manage insomnia.  Nutritional supplements were ordered to help with low calorie malnutrition.  Hospital course was significant for a fall with subsequent left elbow pain due to UCL strain with intramuscular hematoma.  She was advised to use sling for comfort as well as sports cream for local measures.  Low-dose tramadol also use on as needed basis. Serial check of BMET showed lites and renal status to be within normal limits.  Reactive leukocytosis is resolving and H&H is relatively stable.  She has made steady gains during her rehab stay and is modified independent.  She will continue her follow-up home health PT, OT and SNA by [facility] after discharge.  Rehab course: During patient's stay in rehab weekly team conferences were held to monitor patient's progress, set goals and discuss barriers to discharge. At admission, patient required supervision with mobility and min assist with ADL tasks. She has had improvement in activity tolerance, balance, postural control as well as ability to compensate for deficits. She is able to complete all the ADLs and IADLs tasks at modified independent level.  She is modified independent for transfers and is able to ambulate up to 250 feet with use of rollator.  Family has been present for therapy session  and will provide intermittent supervision after discharge.   Disposition: Home  Diet: Regular.   Special Instructions: 1.  No driving or strenuous activity till cleared by MD. 2.  Use saline nasal spray frequently as well as humidifier to to help with nasal dryness.   3.  Continue to use flutter valve at least 4 times daily.    Allergies as of 11/02/2020   No Known Allergies     Medication List    STOP taking these medications   bisacodyl 5 MG EC tablet Commonly known as: DULCOLAX   docusate sodium 100 MG capsule Commonly known as: COLACE   feeding supplement Liqd   HYDROcodone-acetaminophen 5-325 MG tablet Commonly known as: NORCO/VICODIN   insulin aspart 100 UNIT/ML injection Commonly known as: novoLOG   senna 8.6 MG Tabs tablet Commonly known as: SENOKOT     TAKE these medications   acetaminophen 325 MG tablet Commonly known as: TYLENOL Take 1-2 tablets (325-650 mg total) by mouth every 4 (four) hours as needed for mild pain. What changed:   how much to take  when to take  this  reasons to take this   amLODipine 2.5 MG tablet Commonly known as: NORVASC Take 2.5 mg by mouth daily.   ascorbic acid 500 MG tablet Commonly known as: VITAMIN C Take 1 tablet (500 mg total) by mouth daily.   aspirin EC 81 MG tablet Take 81 mg by mouth daily.   Breo Ellipta 100-25 MCG/INH Aepb Generic drug: fluticasone furoate-vilanterol Inhale 1 puff into the lungs daily.   buPROPion 150 MG 24 hr tablet Commonly known as: WELLBUTRIN XL Take 150 mg by mouth daily.   calcium carbonate 500 MG chewable tablet Commonly known as: TUMS - dosed in mg elemental calcium Chew 1 tablet (200 mg of elemental calcium total) by mouth 2 (two) times daily as needed for indigestion or heartburn.   fluticasone 50 MCG/ACT nasal spray Commonly known as: FLONASE Place 2 sprays into both nostrils daily. Start taking on: November 03, 2020   hydrOXYzine 25 MG tablet Commonly known as:  ATARAX/VISTARIL Take 1 tablet (25 mg total) by mouth 3 (three) times daily as needed for anxiety.   levothyroxine 50 MCG tablet Commonly known as: SYNTHROID Take 50 mcg by mouth daily before breakfast.   lip balm ointment Apply topically as needed for lip care.   melatonin 3 MG Tabs tablet Take 1 tablet (3 mg total) by mouth at bedtime.   multivitamin Liqd Take 15 mLs by mouth daily.   pantoprazole 40 MG tablet Commonly known as: PROTONIX Take 1 tablet (40 mg total) by mouth daily.   polyethylene glycol 17 g packet Commonly known as: MIRALAX / GLYCOLAX Take 17 g by mouth daily. What changed:   when to take this  reasons to take this   predniSONE 10 MG tablet Commonly known as: DELTASONE Take two pills on 11/10. On 11/11-11/14 take one pill daily. On 11/15 decrease to 1/2 tab daily till gone What changed:   medication strength  how much to take  how to take this  when to take this  additional instructions   senna-docusate 8.6-50 MG tablet Commonly known as: Senokot-S Take 2 tablets by mouth daily with supper.   traMADol 50 MG tablet--Rx # 20 pills Commonly known as: ULTRAM Take 1 tablet (50 mg total) by mouth every 6 (six) hours as needed for severe pain.   traZODone 100 MG tablet Commonly known as: DESYREL Take 100 mg by mouth at bedtime as needed for sleep.   umeclidinium bromide 62.5 MCG/INH Aepb Commonly known as: INCRUSE ELLIPTA Inhale 1 puff into the lungs daily.   valACYclovir 500 MG tablet Commonly known as: VALTREX Take 500 mg by mouth See admin instructions. Take 1 tablet by mouth twice daily for 3 days during outbreak   zinc sulfate 220 (50 Zn) MG capsule Take 1 capsule (220 mg total) by mouth daily.       Follow-up Information    Lovorn, Jinny Blossom, MD Follow up.   Specialty: Physical Medicine and Rehabilitation Why: call as needed Contact information: [address] [place] [address] [phone]        [name], MD. Call.   Specialty: Family Medicine Why: for post hospital follow up Contact information: [place] [address] [phone]        Nicholes Stairs, MD. Call.   Specialty: Orthopedic Surgery Why: for follow up on left elbow.  Contact information: [address] [place] [address] [phone]  Signed: DARNEISHA WINDHORST 11/02/2020, 5:38 PM

## 2020-11-02 NOTE — Discharge Instructions (Signed)
Inpatient Rehab Discharge Instructions  Jean Shaffer Discharge date and time:  11/02/20   Activities/Precautions/ Functional Status: Activity: no lifting, driving, or strenuous exercise till cleared by MD Diet: low fat, low cholesterol diet Wound Care: none needed   Functional status:  ___ No restrictions     ___ Walk up steps independently ___24/7 supervision/assistance   ___ Walk up steps with assistance __X_ Intermittent supervision/assistance  ___ Bathe/dress independently _X__ Walk with walker    ___ Bathe/dress with assistance ___ Walk Independently    ___ Shower independently ___ Walk with assistance    _X__ Shower with assistance _X__ No alcohol     ___ Return to work/school ________   COMMUNITY REFERRALS UPON DISCHARGE:    Home Health:   PT     OT     SNA                Agency: [facility] [phone]  Medical Equipment/Items Ordered: rollator, tub transfer bench, home oxygen                                                 Agency/Supplier:[facility] [phone]  Special Instructions: 1. Need to continue to use flutter valve every 2-3 hours while awake. Need to use oxygen with activity.  2.   My questions have been answered and I understand these instructions. I will adhere to these goals and the provided educational materials after my discharge from the hospital.  Patient/Caregiver Signature _______________________________ Date __________  Clinician Signature _______________________________________ Date __________  Please bring this form and your medication list with you to all your follow-up doctor's appointments.

## 2020-11-02 NOTE — Progress Notes (Signed)
Inpatient Rehabilitation Care Coordinator  Discharge Note  The overall goal for the admission was met for:   Discharge location: Yes. D/c back to home with her son and his family.   Length of Stay: Yes. 7 days.   Discharge activity level: Yes. Mod I .  Home/community participation: Yes. Limited.   Services provided included: MD, RD, PT, OT, RN, CM, TR, Pharmacy, Neuropsych and SW  Financial Services: Medicare and Medicaid  Follow-up services arranged: Home Health: [facility] for HHPT/OT/aide and DME: [facility] for home o2; rollator and TTB  Comments (or additional information): contact pt [phone]  Patient/Family verbalized understanding of follow-up arrangements: Yes  Individual responsible for coordination of the follow-up plan: Pt to have assistance with coordinating care needs.   Confirmed correct DME delivered: Rana Snare 11/02/2020    Rana Snare

## 2020-11-02 NOTE — Plan of Care (Signed)
Pt to d/c to home with family.

## 2020-11-25 ENCOUNTER — Other Ambulatory Visit: Payer: Self-pay | Admitting: Physical Medicine and Rehabilitation

## 2020-12-31 ENCOUNTER — Encounter: Payer: Self-pay | Admitting: Physical Medicine and Rehabilitation

## 2020-12-31 ENCOUNTER — Other Ambulatory Visit: Payer: Self-pay

## 2020-12-31 ENCOUNTER — Encounter
Payer: Medicare Other | Attending: Physical Medicine and Rehabilitation | Admitting: Physical Medicine and Rehabilitation

## 2020-12-31 VITALS — BP 148/83 | HR 69 | Temp 97.9°F | Ht 64.0 in | Wt 139.4 lb

## 2020-12-31 DIAGNOSIS — R5381 Other malaise: Secondary | ICD-10-CM | POA: Diagnosis present

## 2020-12-31 DIAGNOSIS — R0602 Shortness of breath: Secondary | ICD-10-CM

## 2020-12-31 DIAGNOSIS — U099 Post covid-19 condition, unspecified: Secondary | ICD-10-CM | POA: Insufficient documentation

## 2020-12-31 DIAGNOSIS — B948 Sequelae of other specified infectious and parasitic diseases: Secondary | ICD-10-CM | POA: Insufficient documentation

## 2020-12-31 DIAGNOSIS — R0600 Dyspnea, unspecified: Secondary | ICD-10-CM | POA: Insufficient documentation

## 2020-12-31 NOTE — Patient Instructions (Signed)
Pt is a 70 yr old female with recent hospitalization for COVID/debility- sent home on O2-  Has hx of COPD, HTN, steroid induced hyperglycemia, depression, insomnia, severe protein malnutrition, and fall with L elbow pain- UCL strain with IM hematoma- resolved. .   1. Long COVID- with prolonged debility- still very poor endurance.   2. Has normal loss of smell and taste that comes and goes.   3. Suggest can have COVID vaccine as of now- at least 8-10 weeks after acute infection- and can follow normal protocol of 2nd injection 1 month later.  Get [employer] or [employer].   4. Happy to fill out FMLA forms for adult son based on this.   5. F/U as needed.  6. Work on intensity training- for no more than 1 minute at a time- high intensity- , then calm down to lower intensity of training. Chair yoga- you tube has great videos.   7. Brain fog gets better when you're not as tired.  Rest when tired- don't have to take a nap.

## 2020-12-31 NOTE — Progress Notes (Signed)
Subjective:    Patient ID: Jean Shaffer, female    DOB: [birth year], 70 y.o.   MRN: [id number]  HPI  Pt is a 70 yr old female with recent hospitalization for COVID/debility- sent home on O2-  Has hx of COPD, HTN, steroid induced hyperglycemia, depression, insomnia, severe protein malnutrition, and fall with L elbow pain- UCL strain with IM hematoma.   Accompanied by son, Jean Shaffer.   Now on O2 long term- at least 2 more months- 1L at home- sats still drop with movement- set up right now is 2-3L at a time.   L elbow is better- didn't follow up with Dr Stann Mainland- since was feeling normal.   Son needs extension on FMLA-  Son works in [place] working well- for [employer]- pt still cannot drive;  Son has to help her get in/out car.  So can't really drive because she needs him to get in and out.   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   Bathing and dressing OK- can do on own- takes a lot more time to get read than before- has to sit down to dress-  Toileting going OK- no issues with bowel or bladder- taking senna tabs- drinks plenty of water , like in the hospital.   Still has loss of sense or smell.  Variable- comes in and out.  A lot of people say carbs taste more normal- has gained some weight since hospitalization.  So, gained ~ 4 lbs, weighed 135 lbs in hospital.        Sats been as low as 79% when doing exercises/movement.   Very poor endurance- has to sit down in the middle of doing dishes.    Reached a point with therapy- so needs to maintain and do HEP- hit a plateau- so doesn't need to continue to increase function- just gain endurance.   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   Per son, still has COVID fog- will repeat self 10 minutes later- doesn't remember-   Pain Inventory Average Pain 3 Pain Right Now 3 My pain is aching  In the last 24  hours, has pain interfered with the following? General activity 0 Relation with others 0 Enjoyment of life 0 What TIME of day is your pain at its worst? evening Sleep (in general) Good  Pain is worse with: bending, sitting and inactivity Pain improves with: heat/ice, therapy/exercise, pacing activities and medication Relief from Meds: 9  walk without assistance how many minutes can you walk? 20-30 ability to climb steps?  yes do you drive?  no  retired I need assistance with the following:  shopping  weakness numbness  HFU  HFU    Family History  Problem Relation Age of Onset  . Alcohol abuse Father   . Cancer Father   . Heart disease Father   . Cancer Mother   . Cancer Maternal Grandfather   . Heart disease Maternal Grandfather   . Heart disease Paternal Grandmother   . Diabetes Paternal Grandmother   . Diabetes Paternal Grandfather    Social History   Socioeconomic History  . Marital status: Single    Spouse name: Not on file  . Number of children: Not on file  . Years of education: Not on file  . Highest education level: Not on file  Occupational History  . Not on file  Tobacco Use  .  Smoking status: Former Research scientist (life sciences)  . Smokeless tobacco: Never Used  Vaping Use  . Vaping Use: Never used  Substance and Sexual Activity  . Alcohol use: No  . Drug use: No  . Sexual activity: Not on file  Other Topics Concern  . Not on file  Social History Narrative  . Not on file   Social Determinants of Health   Financial Resource Strain: Not on file  Food Insecurity: Not on file  Transportation Needs: Not on file  Physical Activity: Not on file  Stress: Not on file  Social Connections: Not on file   Past Surgical History:  Procedure Laterality Date  . ABDOMINAL HYSTERECTOMY    . CATARACT EXTRACTION W/ INTRAOCULAR LENS IMPLANT Left 07/14/2014  . CATARACT EXTRACTION W/ INTRAOCULAR LENS IMPLANT Right 10/18/2015  . COLONOSCOPY    . EYE SURGERY    . IR  RADIOLOGIST EVAL & MGMT  01/08/2020  . LAPAROSCOPIC APPENDECTOMY N/A 01/29/2020   Procedure: LAPAROSCOPIC  APPENDECTOMY;  Surgeon: Rolm Bookbinder, MD;  Location: [place];  Service: General;  Laterality: N/A;  . MULTIPLE TOOTH EXTRACTIONS  2002  . NECK SURGERY    . SPINE SURGERY    . WISDOM TOOTH EXTRACTION     per pt 01/21/20   Past Medical History:  Diagnosis Date  . Anemia   . 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 Asthma   . Chronic back pain   . COPD (chronic obstructive pulmonary disease) ([place])    per pt dx in ~2014  . Heart murmur    as a child per pt  . Hypertension   . Hypothyroidism   . Stroke [facility])    per pt "maybe last year or the year before last, they told me I might've had a TIA, but I blacked out"  . Thyroid disease    BP (!) 148/83   Pulse 69   Temp 97.9 F (36.6 C)   Ht 5\' 4"  (1.626 m)   Wt 139 lb 6.4 oz (63.2 kg)   SpO2 98%   BMI 23.93 kg/m   Opioid Risk Score:   Fall Risk Score:  `1  Depression screen PHQ 2/9  Depression screen PHQ 2/9 12/31/2020  Decreased Interest 0  Down, Depressed, Hopeless 0  PHQ - 2 Score 0    Review of Systems  Musculoskeletal: Positive for back pain.  Neurological: Positive for weakness and numbness.  All other systems reviewed and are negative.      Objective:   Physical Exam  Awake, alert, appropriate, on 2L O2- sats 98%, NAD,  accompanied by adult son, Jean Shaffer Has had to cut sentences a little short due to DOE with talking MS: UEs 5/5 in UEs- deltoid, biceps, triceps, WE, grip and finger abd B/L LEs- HF 5/5, KE 4+/5 on L; 5/5 on R; and DF 5-/5 and PF 5/5 B/L Neuro: Intact ot light touch in all 4 extremities EXCEPT L anterior shin from L knee to L ankle.      Assessment & Plan:    Pt is a 70 yr old female with recent hospitalization for COVID/debility- sent home on O2-  Has hx of COPD, HTN, steroid induced hyperglycemia, depression, insomnia, severe protein malnutrition, and fall with L  elbow pain- UCL strain with IM hematoma- resolved. .   1. Long COVID- with prolonged debility- still very poor endurance.   2. Has normal loss of smell and taste that comes and goes.   3.  Suggest can have COVID vaccine as of now- at least 8-10 weeks after acute infection- and can follow normal protocol of 2nd injection 1 month later.  Get [employer] or [employer].   4. Happy to fill out FMLA forms for adult son based on this.   5. F/U as needed.  6. Work on intensity training- for no more than 1 minute at a time- high intensity- , then calm down to lower intensity of training. Chair yoga- you tube has great videos.   7. Brain fog gets better when you're not as tired.  Rest when tired- don't have to take a nap.     I spent a total of 30 minutes on visit- as detailed above.

## 2021-01-22 ENCOUNTER — Other Ambulatory Visit: Payer: Self-pay | Admitting: Physical Medicine and Rehabilitation

## 2021-01-24 ENCOUNTER — Institutional Professional Consult (permissible substitution): Payer: Medicare Other | Admitting: Pulmonary Disease

## 2021-01-26 ENCOUNTER — Ambulatory Visit (INDEPENDENT_AMBULATORY_CARE_PROVIDER_SITE_OTHER): Payer: Medicare Other | Admitting: Pulmonary Disease

## 2021-01-26 ENCOUNTER — Other Ambulatory Visit: Payer: Self-pay

## 2021-01-26 ENCOUNTER — Encounter: Payer: Self-pay | Admitting: Pulmonary Disease

## 2021-01-26 VITALS — BP 130/82 | HR 77 | Temp 97.7°F | Ht 64.5 in | Wt 141.4 lb

## 2021-01-26 DIAGNOSIS — Z87891 Personal history of nicotine dependence: Secondary | ICD-10-CM | POA: Diagnosis not present

## 2021-01-26 DIAGNOSIS — Z8616 Personal history of COVID-19: Secondary | ICD-10-CM | POA: Diagnosis not present

## 2021-01-26 DIAGNOSIS — J449 Chronic obstructive pulmonary disease, unspecified: Secondary | ICD-10-CM | POA: Diagnosis not present

## 2021-01-26 DIAGNOSIS — G4734 Idiopathic sleep related nonobstructive alveolar hypoventilation: Secondary | ICD-10-CM | POA: Diagnosis not present

## 2021-01-26 DIAGNOSIS — Z7185 Encounter for immunization safety counseling: Secondary | ICD-10-CM

## 2021-01-26 NOTE — Patient Instructions (Addendum)
Thank you for visiting Dr. Valeta Harms at [facility]. Today we recommend the following:  Orders Placed This Encounter  Procedures  . Pulmonary Function Test   Recommend ONO on room air Okay to get Covid Vaccine Series   Return in about 4 weeks (around 02/23/2021) for with APP or Dr. Valeta Harms.    Please do your part to reduce the spread of COVID-19.

## 2021-01-26 NOTE — Progress Notes (Signed)
Synopsis: Referred in February 2022 for COPD evaluation, PCP: By [name], MD  Subjective:   PATIENT ID: Jean Shaffer GENDER: female DOB: 07/18/1951, MRN: [id number]  Chief Complaint  Patient presents with  . Consult    Hx of COPD.  After having covid she was having significant SHOB with exertion.  She is doing better at this time but wanted to establish with pulmonary.  She is only using 1 liter of oxygen at bedtime.     This is a 70 year old female with a past medical history of COPD diagnosed in 2014, hypertension, hypothyroidism,?  TIA, asthma.  Patient is a former smoker quit in 2000, smoking less than half pack a day..  Patient presents to primary care office on 12/27/2020, office note reviewed by Dr. Doreene Nest today in the office.  Had a prolonged hospitalization and rehabilitation following Covid 19 recovery.  Recently discontinued home physical therapy monitoring O2 sats remained in the low 90s.  Referred for evaluation by pulmonary regarding COVID-19 diagnosis and COPD diagnosis.  Follow-up also in the office on 12/31/2020 by Dr. Alice Rieger at physical medicine rehabilitation.  At this time still struggling with poor endurance and debility.  But she seems to be slowly recovering.  Counseled at that time on vaccine for COVID-19.  Patient was admitted to the hospital on 09/30/2020.  Prior to admission did receive monoclonal antibody infusion which was given at [facility].  Documentation of this timeline of events was reviewed today in the patient's electronic medical record.  OV 01/26/2021: Here today for evaluation. Since last office visit with primary care patient has been doing much better. She is breathing much better. She feels like she is really turned a corner. After nearly spending 40 days in the hospital related to COVID-19. She states that she did have office spirometry many years ago was diagnosed with COPD. Not on any medications at this time. No recent full PFTs. Chest x-rays from  hospitalization reviewed today in the office. Still using 1 L nasal cannula nightly with sleep. She monitors her O2 sats every morning which have been above 90%.   Past Medical History:  Diagnosis Date  . Anemia   . 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 Asthma   . Chronic back pain   . COPD (chronic obstructive pulmonary disease) ([place])    per pt dx in ~2014  . Heart murmur    as a child per pt  . Hypertension   . Hypothyroidism   . Stroke [facility])    per pt "maybe last year or the year before last, they told me I might've had a TIA, but I blacked out"  . Thyroid disease      Family History  Problem Relation Age of Onset  . Alcohol abuse Father   . Cancer Father   . Heart disease Father   . Cancer Mother   . Cancer Maternal Grandfather   . Heart disease Maternal Grandfather   . Heart disease Paternal Grandmother   . Diabetes Paternal Grandmother   . Diabetes Paternal Grandfather      Past Surgical History:  Procedure Laterality Date  . ABDOMINAL HYSTERECTOMY    . CATARACT EXTRACTION W/ INTRAOCULAR LENS IMPLANT Left 07/14/2014  . CATARACT EXTRACTION W/ INTRAOCULAR LENS IMPLANT Right 10/18/2015  . COLONOSCOPY    . EYE SURGERY    . IR RADIOLOGIST EVAL & MGMT  01/08/2020  . LAPAROSCOPIC APPENDECTOMY N/A 01/29/2020   Procedure:  LAPAROSCOPIC  APPENDECTOMY;  Surgeon: Rolm Bookbinder, MD;  Location: [facility];  Service: General;  Laterality: N/A;  . MULTIPLE TOOTH EXTRACTIONS  2002  . NECK SURGERY    . SPINE SURGERY    . WISDOM TOOTH EXTRACTION     per pt 01/21/20    Social History   Socioeconomic History  . Marital status: Single    Spouse name: Not on file  . Number of children: Not on file  . Years of education: Not on file  . Highest education level: Not on file  Occupational History  . Not on file  Tobacco Use  . Smoking status: Former Research scientist (life sciences)  . Smokeless tobacco: Never Used  Vaping Use  . Vaping Use: Never used  Substance and Sexual Activity   . Alcohol use: No  . Drug use: No  . Sexual activity: Not on file  Other Topics Concern  . Not on file  Social History Narrative  . Not on file   Social Determinants of Health   Financial Resource Strain: Not on file  Food Insecurity: Not on file  Transportation Needs: Not on file  Physical Activity: Not on file  Stress: Not on file  Social Connections: Not on file  Intimate Partner Violence: Not on file     No Known Allergies   Outpatient Medications Prior to Visit  Medication Sig Dispense Refill  . acetaminophen (TYLENOL) 325 MG tablet Take 1-2 tablets (325-650 mg total) by mouth every 4 (four) hours as needed for mild pain.    Marland Kitchen albuterol (VENTOLIN HFA) 108 (90 Base) MCG/ACT inhaler Inhale into the lungs.    Marland Kitchen amLODipine (NORVASC) 2.5 MG tablet Take 2.5 mg by mouth daily.    Marland Kitchen ascorbic acid (VITAMIN C) 500 MG tablet Take 1 tablet (500 mg total) by mouth daily. 1 tablet 0  . aspirin EC 81 MG tablet Take 81 mg by mouth daily.    Marland Kitchen buPROPion (WELLBUTRIN XL) 150 MG 24 hr tablet Take 150 mg by mouth daily.     . calcium carbonate (TUMS - DOSED IN MG ELEMENTAL CALCIUM) 500 MG chewable tablet Chew 1 tablet (200 mg of elemental calcium total) by mouth 2 (two) times daily as needed for indigestion or heartburn. 1 tablet 0  . hydrOXYzine (ATARAX/VISTARIL) 25 MG tablet Take 1 tablet (25 mg total) by mouth 3 (three) times daily as needed for anxiety. 30 tablet 0  . levothyroxine (SYNTHROID, LEVOTHROID) 50 MCG tablet Take 50 mcg by mouth daily before breakfast.     . lip balm (CARMEX) ointment Apply topically as needed for lip care. 7 g 0  . melatonin 3 MG TABS tablet Take 1 tablet (3 mg total) by mouth at bedtime. 30 tablet 0  . meloxicam (MOBIC) 15 MG tablet Take 15 mg by mouth daily.    . Multiple Vitamin (MULTIVITAMIN) LIQD Take 15 mLs by mouth daily. 30 mL 0  . polyethylene glycol (MIRALAX / GLYCOLAX) 17 g packet Take 17 g by mouth daily. (Patient taking differently: Take 17 g by  mouth daily as needed for mild constipation.) 14 each 0  . senna-docusate (SENOKOT-S) 8.6-50 MG tablet Take 2 tablets by mouth daily with supper. 60 tablet 0  . traMADol (ULTRAM) 50 MG tablet Take 1 tablet (50 mg total) by mouth every 6 (six) hours as needed for severe pain. 20 tablet 0  . traZODone (DESYREL) 100 MG tablet Take 100 mg by mouth at bedtime as needed for sleep.     Marland Kitchen  umeclidinium bromide (INCRUSE ELLIPTA) 62.5 MCG/INH AEPB Inhale 1 puff into the lungs daily. 1 each 0  . valACYclovir (VALTREX) 500 MG tablet Take 500 mg by mouth See admin instructions. Take 1 tablet by mouth twice daily for 3 days during outbreak    . zinc sulfate 220 (50 Zn) MG capsule Take 1 capsule (220 mg total) by mouth daily. 1 capsule 0  . fluticasone (FLONASE) 50 MCG/ACT nasal spray Place 2 sprays into both nostrils daily. (Patient not taking: Reported on 01/26/2021) 9.9 mL 2  . fluticasone furoate-vilanterol (BREO ELLIPTA) 100-25 MCG/INH AEPB Inhale 1 puff into the lungs daily.  (Patient not taking: Reported on 01/26/2021)    . pantoprazole (PROTONIX) 40 MG tablet Take 1 tablet (40 mg total) by mouth daily. (Patient not taking: Reported on 01/26/2021) 30 tablet 0  . predniSONE (DELTASONE) 10 MG tablet Take two pills on 11/10. On 11/11-11/14 take one pill daily. On 11/15 decrease to 1/2 tab daily till gone (Patient not taking: Reported on 01/26/2021) 8 tablet 0   No facility-administered medications prior to visit.    Review of Systems  Constitutional: Negative for chills, fever, malaise/fatigue and weight loss.  HENT: Negative for hearing loss, sore throat and tinnitus.   Eyes: Negative for blurred vision and double vision.  Respiratory: Negative for cough, hemoptysis, sputum production, shortness of breath, wheezing and stridor.   Cardiovascular: Negative for chest pain, palpitations, orthopnea, leg swelling and PND.  Gastrointestinal: Negative for abdominal pain, constipation, diarrhea, heartburn, nausea and  vomiting.  Genitourinary: Negative for dysuria, hematuria and urgency.  Musculoskeletal: Negative for joint pain and myalgias.  Skin: Negative for itching and rash.  Neurological: Negative for dizziness, tingling, weakness and headaches.  Endo/Heme/Allergies: Negative for environmental allergies. Does not bruise/bleed easily.  Psychiatric/Behavioral: Negative for depression. The patient is not nervous/anxious and does not have insomnia.   All other systems reviewed and are negative.    Objective:  Physical Exam Vitals reviewed.  Constitutional:      General: She is not in acute distress.    Appearance: She is well-developed and well-nourished.  HENT:     Head: Normocephalic and atraumatic.     Mouth/Throat:     Mouth: Oropharynx is clear and moist.  Eyes:     General: No scleral icterus.    Conjunctiva/sclera: Conjunctivae normal.     Pupils: Pupils are equal, round, and reactive to light.  Neck:     Vascular: No JVD.     Trachea: No tracheal deviation.  Cardiovascular:     Rate and Rhythm: Normal rate and regular rhythm.     Pulses: Intact distal pulses.     Heart sounds: Normal heart sounds. No murmur heard.   Pulmonary:     Effort: Pulmonary effort is normal. No tachypnea, accessory muscle usage or respiratory distress.     Breath sounds: Normal breath sounds. No stridor. No wheezing, rhonchi or rales.  Abdominal:     General: Bowel sounds are normal. There is no distension.     Palpations: Abdomen is soft.     Tenderness: There is no abdominal tenderness.  Musculoskeletal:        General: No tenderness or edema.     Cervical back: Neck supple.  Lymphadenopathy:     Cervical: No cervical adenopathy.  Skin:    General: Skin is warm and dry.     Capillary Refill: Capillary refill takes less than 2 seconds.     Findings: No rash.  Neurological:  Mental Status: She is alert and oriented to person, place, and time.  Psychiatric:        Mood and Affect: Mood and  affect normal.        Behavior: Behavior normal.      Vitals:   01/26/21 1137  BP: 130/82  Pulse: 77  Temp: 97.7 F (36.5 C)  TempSrc: Tympanic  SpO2: 96%  Weight: 141 lb 6 oz (64.1 kg)  Height: 5' 4.5" (1.638 m)   96% on RA BMI Readings from Last 3 Encounters:  01/26/21 23.89 kg/m  12/31/20 23.93 kg/m  11/02/20 23.76 kg/m   Wt Readings from Last 3 Encounters:  01/26/21 141 lb 6 oz (64.1 kg)  12/31/20 139 lb 6.4 oz (63.2 kg)  11/02/20 138 lb 7.2 oz (62.8 kg)     CBC    Component Value Date/Time   WBC 11.0 (H) 11/01/2020 0813   RBC 3.43 (L) 11/01/2020 0813   HGB 9.8 (L) 11/01/2020 0813   HCT 31.1 (L) 11/01/2020 0813   PLT 208 11/01/2020 0813   MCV 90.7 11/01/2020 0813   MCH 28.6 11/01/2020 0813   MCHC 31.5 11/01/2020 0813   RDW 15.3 11/01/2020 0813   LYMPHSABS 2.8 10/26/2020 0610   MONOABS 1.2 (H) 10/26/2020 0610   EOSABS 0.1 10/26/2020 0610   BASOSABS 0.0 10/26/2020 0610    Chest Imaging: October 2021 chest x-ray: 10/16/2020 chest x-ray Bilateral airspace disease infiltrates consistent with COVID-19 pneumonia. The patient's images have been independently reviewed by me.    Pulmonary Functions Testing Results: No flowsheet data found.  FeNO:   Pathology:   Echocardiogram:  .  10/04/2020 lower extremity duplex: Negative for DVT.  Results reviewed  Heart Catheterization:     Assessment & Plan:     ICD-10-CM   1. Chronic obstructive pulmonary disease, unspecified COPD type ([place])  J44.9 Pulmonary Function Test    Pulse oximetry, overnight  2. Former smoker  Z87.891 Pulmonary Function Test  3. History of COVID-19  Z86.16    40 day hospital stay, Oct 2021  4. Nocturnal hypoxemia  G47.34     Discussion:  This is a 70 year old female, recent admission for COVID-19, patient was admitted to the hospital for 40 days. She has since recovered and been doing much better. She does have nocturnal hypoxemia on 1 L nasal cannula. Wants to know if she  is able to come off of oxygen.  Plan:  We will obtain a overnight pulse oximetry on room air to see if she still needs nocturnal oxygen supplementation. We will obtain pulmonary function test today due to her history of abnormal spirometry and former smoker. Can continue on Incruse Ellipta at this time. Continue albuterol as needed Follow-up with [place] in 3 to 4 weeks after PFTs are complete to review in the office. Patient can see me or one of our APPs at follow-up.   Current Outpatient Medications:  .  acetaminophen (TYLENOL) 325 MG tablet, Take 1-2 tablets (325-650 mg total) by mouth every 4 (four) hours as needed for mild pain., Disp: , Rfl:  .  albuterol (VENTOLIN HFA) 108 (90 Base) MCG/ACT inhaler, Inhale into the lungs., Disp: , Rfl:  .  amLODipine (NORVASC) 2.5 MG tablet, Take 2.5 mg by mouth daily., Disp: , Rfl:  .  ascorbic acid (VITAMIN C) 500 MG tablet, Take 1 tablet (500 mg total) by mouth daily., Disp: 1 tablet, Rfl: 0 .  aspirin EC 81 MG tablet, Take 81 mg by mouth daily., Disp: ,  Rfl:  .  buPROPion (WELLBUTRIN XL) 150 MG 24 hr tablet, Take 150 mg by mouth daily. , Disp: , Rfl:  .  calcium carbonate (TUMS - DOSED IN MG ELEMENTAL CALCIUM) 500 MG chewable tablet, Chew 1 tablet (200 mg of elemental calcium total) by mouth 2 (two) times daily as needed for indigestion or heartburn., Disp: 1 tablet, Rfl: 0 .  hydrOXYzine (ATARAX/VISTARIL) 25 MG tablet, Take 1 tablet (25 mg total) by mouth 3 (three) times daily as needed for anxiety., Disp: 30 tablet, Rfl: 0 .  levothyroxine (SYNTHROID, LEVOTHROID) 50 MCG tablet, Take 50 mcg by mouth daily before breakfast. , Disp: , Rfl:  .  lip balm (CARMEX) ointment, Apply topically as needed for lip care., Disp: 7 g, Rfl: 0 .  melatonin 3 MG TABS tablet, Take 1 tablet (3 mg total) by mouth at bedtime., Disp: 30 tablet, Rfl: 0 .  meloxicam (MOBIC) 15 MG tablet, Take 15 mg by mouth daily., Disp: , Rfl:  .  Multiple Vitamin (MULTIVITAMIN) LIQD, Take  15 mLs by mouth daily., Disp: 30 mL, Rfl: 0 .  polyethylene glycol (MIRALAX / GLYCOLAX) 17 g packet, Take 17 g by mouth daily. (Patient taking differently: Take 17 g by mouth daily as needed for mild constipation.), Disp: 14 each, Rfl: 0 .  senna-docusate (SENOKOT-S) 8.6-50 MG tablet, Take 2 tablets by mouth daily with supper., Disp: 60 tablet, Rfl: 0 .  traMADol (ULTRAM) 50 MG tablet, Take 1 tablet (50 mg total) by mouth every 6 (six) hours as needed for severe pain., Disp: 20 tablet, Rfl: 0 .  traZODone (DESYREL) 100 MG tablet, Take 100 mg by mouth at bedtime as needed for sleep. , Disp: , Rfl:  .  umeclidinium bromide (INCRUSE ELLIPTA) 62.5 MCG/INH AEPB, Inhale 1 puff into the lungs daily., Disp: 1 each, Rfl: 0 .  valACYclovir (VALTREX) 500 MG tablet, Take 500 mg by mouth See admin instructions. Take 1 tablet by mouth twice daily for 3 days during outbreak, Disp: , Rfl:  .  zinc sulfate 220 (50 Zn) MG capsule, Take 1 capsule (220 mg total) by mouth daily., Disp: 1 capsule, Rfl: 0  I spent 61 minutes dedicated to the care of this patient on the date of this encounter to include pre-visit review of records, face-to-face time with the patient discussing conditions above, post visit ordering of testing, clinical documentation with the electronic health record, making appropriate referrals as documented, and communicating necessary findings to members of the patients care team.   Garner Nash, DO [facility] 01/26/2021 11:48 AM

## 2021-02-01 ENCOUNTER — Encounter: Payer: Self-pay | Admitting: Pulmonary Disease

## 2021-02-16 ENCOUNTER — Telehealth: Payer: Self-pay | Admitting: *Deleted

## 2021-02-16 DIAGNOSIS — J449 Chronic obstructive pulmonary disease, unspecified: Secondary | ICD-10-CM

## 2021-02-16 NOTE — Telephone Encounter (Signed)
I have called the pt and spoke with her about the results of the overnight oximetry.  Pt voiced her understanding and she is aware that the order has been sent to [employer] to get them to come and pick up all oxygen from her home.

## 2021-02-16 NOTE — Telephone Encounter (Signed)
BI has reviewed her overnight oximetry and she does not need oxygen at bedtime.  I will call the pt and let her know of these results.

## 2021-03-21 ENCOUNTER — Other Ambulatory Visit (HOSPITAL_COMMUNITY)
Admission: RE | Admit: 2021-03-21 | Discharge: 2021-03-21 | Disposition: A | Discharge status: Home / Self Care | Payer: Medicare Other | Source: Ambulatory Visit | Attending: Pulmonary Disease | Admitting: Pulmonary Disease

## 2021-03-21 DIAGNOSIS — Z01812 Encounter for preprocedural laboratory examination: Secondary | ICD-10-CM | POA: Insufficient documentation

## 2021-03-21 DIAGNOSIS — Z20822 Contact with and (suspected) exposure to covid-19: Secondary | ICD-10-CM | POA: Diagnosis not present

## 2021-03-21 LAB — SARS CORONAVIRUS 2 (TAT 6-24 HRS): SARS Coronavirus 2: NEGATIVE

## 2021-03-23 ENCOUNTER — Other Ambulatory Visit: Payer: Self-pay

## 2021-03-23 ENCOUNTER — Ambulatory Visit (INDEPENDENT_AMBULATORY_CARE_PROVIDER_SITE_OTHER): Payer: Medicare Other | Admitting: Pulmonary Disease

## 2021-03-23 ENCOUNTER — Encounter: Payer: Self-pay | Admitting: Pulmonary Disease

## 2021-03-23 VITALS — BP 136/80 | HR 74 | Temp 97.1°F | Ht 64.0 in | Wt 146.0 lb

## 2021-03-23 DIAGNOSIS — J449 Chronic obstructive pulmonary disease, unspecified: Secondary | ICD-10-CM | POA: Diagnosis not present

## 2021-03-23 DIAGNOSIS — Z87891 Personal history of nicotine dependence: Secondary | ICD-10-CM | POA: Diagnosis not present

## 2021-03-23 DIAGNOSIS — J984 Other disorders of lung: Secondary | ICD-10-CM

## 2021-03-23 DIAGNOSIS — Z8616 Personal history of COVID-19: Secondary | ICD-10-CM | POA: Diagnosis not present

## 2021-03-23 DIAGNOSIS — R942 Abnormal results of pulmonary function studies: Secondary | ICD-10-CM

## 2021-03-23 DIAGNOSIS — G4734 Idiopathic sleep related nonobstructive alveolar hypoventilation: Secondary | ICD-10-CM

## 2021-03-23 LAB — PULMONARY FUNCTION TEST
DL/VA % pred: 90 %
DL/VA: 3.74 ml/min/mmHg/L
DLCO cor % pred: 60 %
DLCO cor: 11.75 ml/min/mmHg
DLCO unc % pred: 60 %
DLCO unc: 11.75 ml/min/mmHg
FEF 25-75 Post: 2.7 L/sec
FEF 25-75 Pre: 2.23 L/sec
FEF2575-%Change-Post: 21 %
FEF2575-%Pred-Post: 142 %
FEF2575-%Pred-Pre: 117 %
FEV1-%Change-Post: 4 %
FEV1-%Pred-Post: 88 %
FEV1-%Pred-Pre: 84 %
FEV1-Post: 2.01 L
FEV1-Pre: 1.92 L
FEV1FVC-%Change-Post: 1 %
FEV1FVC-%Pred-Pre: 112 %
FEV6-%Change-Post: 3 %
FEV6-%Pred-Post: 80 %
FEV6-%Pred-Pre: 78 %
FEV6-Post: 2.32 L
FEV6-Pre: 2.24 L
FEV6FVC-%Pred-Post: 104 %
FEV6FVC-%Pred-Pre: 104 %
FVC-%Change-Post: 3 %
FVC-%Pred-Post: 77 %
FVC-%Pred-Pre: 74 %
FVC-Post: 2.32 L
FVC-Pre: 2.24 L
Post FEV1/FVC ratio: 87 %
Post FEV6/FVC ratio: 100 %
Pre FEV1/FVC ratio: 85 %
Pre FEV6/FVC Ratio: 100 %
RV % pred: 46 %
RV: 1.02 L
TLC % pred: 65 %
TLC: 3.31 L

## 2021-03-23 NOTE — Patient Instructions (Addendum)
Thank you for visiting Dr. Valeta Harms at [facility]. Today we recommend the following:  Orders Placed This Encounter  Procedures  . Pulmonary Function Test   Repeat PFTS in 12 months  Call me if symptoms change  Return in about 1 year (around 03/23/2022), or if symptoms worsen or fail to improve, for with APP or Dr. Valeta Harms.    Please do your part to reduce the spread of COVID-19.

## 2021-03-23 NOTE — Progress Notes (Signed)
PFT done today. 

## 2021-03-23 NOTE — Progress Notes (Signed)
Synopsis: Referred in February 2022 for COPD evaluation, PCP: By [name], MD  Subjective:   PATIENT ID: Jean Shaffer GENDER: female DOB: 1951-07-30, MRN: [id number]  Chief Complaint  Patient presents with  . Follow-up    Here to discuss PFT results today.  ONO has not been done    This is a 70 year old female with a past medical history of COPD diagnosed in 2014, hypertension, hypothyroidism,?  TIA, asthma.  Patient is a former smoker quit in 2000, smoking less than half pack a day..  Patient presents to primary care office on 12/27/2020, office note reviewed by Dr. Doreene Nest today in the office.  Had a prolonged hospitalization and rehabilitation following Covid 19 recovery.  Recently discontinued home physical therapy monitoring O2 sats remained in the low 90s.  Referred for evaluation by pulmonary regarding COVID-19 diagnosis and COPD diagnosis.  Follow-up also in the office on 12/31/2020 by Dr. Alice Rieger at physical medicine rehabilitation.  At this time still struggling with poor endurance and debility.  But she seems to be slowly recovering.  Counseled at that time on vaccine for COVID-19.  Patient was admitted to the hospital on 09/30/2020.  Prior to admission did receive monoclonal antibody infusion which was given at [facility].  Documentation of this timeline of events was reviewed today in the patient's electronic medical record.  OV 01/26/2021: Here today for evaluation. Since last office visit with primary care patient has been doing much better. She is breathing much better. She feels like she is really turned a corner. After nearly spending 40 days in the hospital related to COVID-19. She states that she did have office spirometry many years ago was diagnosed with COPD. Not on any medications at this time. No recent full PFTs. Chest x-rays from hospitalization reviewed today in the office. Still using 1 L nasal cannula nightly with sleep. She monitors her O2 sats every morning which  have been above 90%.  OV 03/23/2021: Here today for follow-up after recent pulmonary function test completed prior to office visit.  Reviewed with patient today in the office.  Test revealed a FVC of 77%, FEV1 of 88% predicted, ratio of 87, TLC 65%, DLCO of 60%.  She does have some mild evidence of centrilobular emphysema.  She has predominant interstitial changes from her Covid diagnosis.  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  She also has a moderate reduced DLCO.  From a respiratory standpoint she is doing much better.  She is able to complete her activities of daily living.  She does have some shortness of breath with significant exertion but she has tried to return to most of her normal life activities.  Denies fever chills night sweats weight loss hemoptysis.   Past Medical History:  Diagnosis Date  . Anemia   . 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 Asthma   . Chronic back pain   . COPD (chronic obstructive pulmonary disease) ([place])    per pt dx in ~2014  . Heart murmur    as a child per pt  . Hypertension   . Hypothyroidism   . Stroke [facility])    per pt "maybe last year or the year before last, they told me I might've had a TIA, but I blacked out"  . Thyroid disease      Family History  Problem Relation Age of Onset  . Alcohol abuse Father   . Cancer  Father   . Heart disease Father   . Cancer Mother   . Cancer Maternal Grandfather   . Heart disease Maternal Grandfather   . Heart disease Paternal Grandmother   . Diabetes Paternal Grandmother   . Diabetes Paternal Grandfather      Past Surgical History:  Procedure Laterality Date  . ABDOMINAL HYSTERECTOMY    . CATARACT EXTRACTION W/ INTRAOCULAR LENS IMPLANT Left 07/14/2014  . CATARACT EXTRACTION W/ INTRAOCULAR LENS IMPLANT Right 10/18/2015  . COLONOSCOPY    . EYE SURGERY    . IR RADIOLOGIST EVAL & MGMT  01/08/2020  . LAPAROSCOPIC APPENDECTOMY N/A 01/29/2020    Procedure: LAPAROSCOPIC  APPENDECTOMY;  Surgeon: Rolm Bookbinder, MD;  Location: [place];  Service: General;  Laterality: N/A;  . MULTIPLE TOOTH EXTRACTIONS  2002  . NECK SURGERY    . SPINE SURGERY    . WISDOM TOOTH EXTRACTION     per pt 01/21/20    Social History   Socioeconomic History  . Marital status: Single    Spouse name: Not on file  . Number of children: Not on file  . Years of education: Not on file  . Highest education level: Not on file  Occupational History  . Not on file  Tobacco Use  . Smoking status: Former Research scientist (life sciences)  . Smokeless tobacco: Never Used  Vaping Use  . Vaping Use: Never used  Substance and Sexual Activity  . Alcohol use: No  . Drug use: No  . Sexual activity: Not on file  Other Topics Concern  . Not on file  Social History Narrative  . Not on file   Social Determinants of Health   Financial Resource Strain: Not on file  Food Insecurity: Not on file  Transportation Needs: Not on file  Physical Activity: Not on file  Stress: Not on file  Social Connections: Not on file  Intimate Partner Violence: Not on file     No Known Allergies   Outpatient Medications Prior to Visit  Medication Sig Dispense Refill  . acetaminophen (TYLENOL) 325 MG tablet Take 1-2 tablets (325-650 mg total) by mouth every 4 (four) hours as needed for mild pain.    Marland Kitchen albuterol (VENTOLIN HFA) 108 (90 Base) MCG/ACT inhaler Inhale into the lungs.    Marland Kitchen amLODipine (NORVASC) 2.5 MG tablet Take 2.5 mg by mouth daily.    Marland Kitchen ascorbic acid (VITAMIN C) 500 MG tablet Take 1 tablet (500 mg total) by mouth daily. 1 tablet 0  . aspirin EC 81 MG tablet Take 81 mg by mouth daily.    Marland Kitchen buPROPion (WELLBUTRIN XL) 150 MG 24 hr tablet Take 150 mg by mouth daily.     . calcium carbonate (TUMS - DOSED IN MG ELEMENTAL CALCIUM) 500 MG chewable tablet Chew 1 tablet (200 mg of elemental calcium total) by mouth 2 (two) times daily as needed for indigestion or heartburn. 1 tablet 0  . hydrOXYzine  (ATARAX/VISTARIL) 25 MG tablet Take 1 tablet (25 mg total) by mouth 3 (three) times daily as needed for anxiety. 30 tablet 0  . levothyroxine (SYNTHROID, LEVOTHROID) 50 MCG tablet Take 50 mcg by mouth daily before breakfast.     . lip balm (CARMEX) ointment Apply topically as needed for lip care. 7 g 0  . melatonin 3 MG TABS tablet Take 1 tablet (3 mg total) by mouth at bedtime. 30 tablet 0  . meloxicam (MOBIC) 15 MG tablet Take 15 mg by mouth daily.    . Multiple Vitamin (  MULTIVITAMIN) LIQD Take 15 mLs by mouth daily. 30 mL 0  . polyethylene glycol (MIRALAX / GLYCOLAX) 17 g packet Take 17 g by mouth daily. (Patient taking differently: Take 17 g by mouth daily as needed for mild constipation.) 14 each 0  . senna-docusate (SENOKOT-S) 8.6-50 MG tablet Take 2 tablets by mouth daily with supper. 60 tablet 0  . traMADol (ULTRAM) 50 MG tablet Take 1 tablet (50 mg total) by mouth every 6 (six) hours as needed for severe pain. 20 tablet 0  . traZODone (DESYREL) 100 MG tablet Take 100 mg by mouth at bedtime as needed for sleep.     Marland Kitchen umeclidinium bromide (INCRUSE ELLIPTA) 62.5 MCG/INH AEPB Inhale 1 puff into the lungs daily. 1 each 0  . valACYclovir (VALTREX) 500 MG tablet Take 500 mg by mouth See admin instructions. Take 1 tablet by mouth twice daily for 3 days during outbreak    . zinc sulfate 220 (50 Zn) MG capsule Take 1 capsule (220 mg total) by mouth daily. 1 capsule 0   No facility-administered medications prior to visit.    Review of Systems  Constitutional: Negative for chills, fever, malaise/fatigue and weight loss.  HENT: Negative for hearing loss, sore throat and tinnitus.   Eyes: Negative for blurred vision and double vision.  Respiratory: Positive for shortness of breath. Negative for cough, hemoptysis, sputum production, wheezing and stridor.   Cardiovascular: Negative for chest pain, palpitations, orthopnea, leg swelling and PND.  Gastrointestinal: Negative for abdominal pain,  constipation, diarrhea, heartburn, nausea and vomiting.  Genitourinary: Negative for dysuria, hematuria and urgency.  Musculoskeletal: Negative for joint pain and myalgias.  Skin: Negative for itching and rash.  Neurological: Negative for dizziness, tingling, weakness and headaches.  Endo/Heme/Allergies: Negative for environmental allergies. Does not bruise/bleed easily.  Psychiatric/Behavioral: Negative for depression. The patient is not nervous/anxious and does not have insomnia.   All other systems reviewed and are negative.    Objective:  Physical Exam Vitals reviewed.  Constitutional:      General: She is not in acute distress.    Appearance: She is well-developed.  HENT:     Head: Normocephalic and atraumatic.  Eyes:     General: No scleral icterus.    Conjunctiva/sclera: Conjunctivae normal.     Pupils: Pupils are equal, round, and reactive to light.  Neck:     Vascular: No JVD.     Trachea: No tracheal deviation.  Cardiovascular:     Rate and Rhythm: Normal rate and regular rhythm.     Heart sounds: Normal heart sounds. No murmur heard.   Pulmonary:     Effort: Pulmonary effort is normal. No tachypnea, accessory muscle usage or respiratory distress.     Breath sounds: No stridor. No wheezing, rhonchi or rales.     Comments: Basilar crackles  Abdominal:     General: Bowel sounds are normal. There is no distension.     Palpations: Abdomen is soft.     Tenderness: There is no abdominal tenderness.  Musculoskeletal:        General: No tenderness.     Cervical back: Neck supple.  Lymphadenopathy:     Cervical: No cervical adenopathy.  Skin:    General: Skin is warm and dry.     Capillary Refill: Capillary refill takes less than 2 seconds.     Findings: No rash.  Neurological:     Mental Status: She is alert and oriented to person, place, and time.  Psychiatric:  Behavior: Behavior normal.      Vitals:   03/23/21 1111  BP: 136/80  Pulse: 74  Temp:  (!) 97.1 F (36.2 C)  TempSrc: Tympanic  SpO2: 96%  Weight: 146 lb (66.2 kg)  Height: 5\' 4"  (1.626 m)   96% on RA BMI Readings from Last 3 Encounters:  03/23/21 25.06 kg/m  01/26/21 23.89 kg/m  12/31/20 23.93 kg/m   Wt Readings from Last 3 Encounters:  03/23/21 146 lb (66.2 kg)  01/26/21 141 lb 6 oz (64.1 kg)  12/31/20 139 lb 6.4 oz (63.2 kg)     CBC    Component Value Date/Time   WBC 11.0 (H) 11/01/2020 0813   RBC 3.43 (L) 11/01/2020 0813   HGB 9.8 (L) 11/01/2020 0813   HCT 31.1 (L) 11/01/2020 0813   PLT 208 11/01/2020 0813   MCV 90.7 11/01/2020 0813   MCH 28.6 11/01/2020 0813   MCHC 31.5 11/01/2020 0813   RDW 15.3 11/01/2020 0813   LYMPHSABS 2.8 10/26/2020 0610   MONOABS 1.2 (H) 10/26/2020 0610   EOSABS 0.1 10/26/2020 0610   BASOSABS 0.0 10/26/2020 0610    Chest Imaging: October 2021 chest x-ray: 10/16/2020 chest x-ray Bilateral airspace disease infiltrates consistent with COVID-19 pneumonia. The patient's images have been independently reviewed by me.    Pulmonary Functions Testing Results: PFT Results Latest Ref Rng & Units 03/23/2021  FVC-Pre L 2.24  FVC-Predicted Pre % 74  FVC-Post L 2.32  FVC-Predicted Post % 77  Pre FEV1/FVC % % 85  Post FEV1/FCV % % 87  FEV1-Pre L 1.92  FEV1-Predicted Pre % 84  FEV1-Post L 2.01  DLCO uncorrected ml/min/mmHg 11.75  DLCO UNC% % 60  DLCO corrected ml/min/mmHg 11.75  DLCO COR %Predicted % 60  DLVA Predicted % 90  TLC L 3.31  TLC % Predicted % 65  RV % Predicted % 46  Pulmonary function tests 03/23/2021: Reviewed and interpreted today in the office with patient.  FeNO:   Pathology:   Echocardiogram:  .  10/04/2020 lower extremity duplex: Negative for DVT.  Results reviewed  Heart Catheterization:     Assessment & Plan:     ICD-10-CM   1. 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 Chronic obstructive pulmonary disease, unspecified COPD type ([place])  J44.9   3. Former smoker   Z87.891   4. History of COVID-19  Z86.16   5. Nocturnal hypoxemia  G47.34   6. Restrictive lung disease  J98.4 Pulmonary Function Test  7. Decreased diffusion capacity  R94.2 Pulmonary Function Test    Discussion:  70 year old female, recent admission for COVID-19, 40-day hospital stay.  Was discharged on oxygen therapy.  Now off oxygen therapy.  Overnight pulse ox was completed.  From a respiratory standpoint she has slowly been recovering.  Pulmonary function test revealed a mixed obstructive and restrictive defect.  She is a former smoker.  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  Plan: Patient to let us know if she has any worsening of her symptomatology.  I think we need to monitor her clinically. We will repeat pulmonary function test in 1 year. If she has any change in her symptoms she is to let us know and we can consider repeat PFTs sooner and or axial CT imaging of the chest. Continue Incruse Ellipta. Continue albuterol as needed for shortness of breath and wheezing.  We reviewed all of these recommendations today in the office with the patient  as well as her pulmonary function test.    Current Outpatient Medications:  .  acetaminophen (TYLENOL) 325 MG tablet, Take 1-2 tablets (325-650 mg total) by mouth every 4 (four) hours as needed for mild pain., Disp: , Rfl:  .  albuterol (VENTOLIN HFA) 108 (90 Base) MCG/ACT inhaler, Inhale into the lungs., Disp: , Rfl:  .  amLODipine (NORVASC) 2.5 MG tablet, Take 2.5 mg by mouth daily., Disp: , Rfl:  .  ascorbic acid (VITAMIN C) 500 MG tablet, Take 1 tablet (500 mg total) by mouth daily., Disp: 1 tablet, Rfl: 0 .  aspirin EC 81 MG tablet, Take 81 mg by mouth daily., Disp: , Rfl:  .  buPROPion (WELLBUTRIN XL) 150 MG 24 hr tablet, Take 150 mg by mouth daily. , Disp: , Rfl:  .  calcium carbonate (TUMS - DOSED IN MG ELEMENTAL CALCIUM) 500 MG chewable tablet, Chew 1 tablet (200 mg of elemental calcium total) by  mouth 2 (two) times daily as needed for indigestion or heartburn., Disp: 1 tablet, Rfl: 0 .  hydrOXYzine (ATARAX/VISTARIL) 25 MG tablet, Take 1 tablet (25 mg total) by mouth 3 (three) times daily as needed for anxiety., Disp: 30 tablet, Rfl: 0 .  levothyroxine (SYNTHROID, LEVOTHROID) 50 MCG tablet, Take 50 mcg by mouth daily before breakfast. , Disp: , Rfl:  .  lip balm (CARMEX) ointment, Apply topically as needed for lip care., Disp: 7 g, Rfl: 0 .  melatonin 3 MG TABS tablet, Take 1 tablet (3 mg total) by mouth at bedtime., Disp: 30 tablet, Rfl: 0 .  meloxicam (MOBIC) 15 MG tablet, Take 15 mg by mouth daily., Disp: , Rfl:  .  Multiple Vitamin (MULTIVITAMIN) LIQD, Take 15 mLs by mouth daily., Disp: 30 mL, Rfl: 0 .  polyethylene glycol (MIRALAX / GLYCOLAX) 17 g packet, Take 17 g by mouth daily. (Patient taking differently: Take 17 g by mouth daily as needed for mild constipation.), Disp: 14 each, Rfl: 0 .  senna-docusate (SENOKOT-S) 8.6-50 MG tablet, Take 2 tablets by mouth daily with supper., Disp: 60 tablet, Rfl: 0 .  traMADol (ULTRAM) 50 MG tablet, Take 1 tablet (50 mg total) by mouth every 6 (six) hours as needed for severe pain., Disp: 20 tablet, Rfl: 0 .  traZODone (DESYREL) 100 MG tablet, Take 100 mg by mouth at bedtime as needed for sleep. , Disp: , Rfl:  .  umeclidinium bromide (INCRUSE ELLIPTA) 62.5 MCG/INH AEPB, Inhale 1 puff into the lungs daily., Disp: 1 each, Rfl: 0 .  valACYclovir (VALTREX) 500 MG tablet, Take 500 mg by mouth See admin instructions. Take 1 tablet by mouth twice daily for 3 days during outbreak, Disp: , Rfl:  .  zinc sulfate 220 (50 Zn) MG capsule, Take 1 capsule (220 mg total) by mouth daily., Disp: 1 capsule, Rfl: 0   Garner Nash, DO [facility] 03/23/2021 11:40 AM

## 2021-05-06 IMAGING — RF DG SINUS / FISTULA TRACT / ABSCESSOGRAM
2 series · 7 of 7 positions shown · non-contrast
Comparison: none

CLINICAL DATA: Appendicitis with abscess, post drain catheter
placement 12/28/2019. Decreasing outputs.

EXAM:
ABSCESS DRAIN CATHETER INJECTION UNDER FLUOROSCOPY
TECHNIQUE: The procedure, risks (including but not limited to bleeding,
infection, organ damage), benefits, and alternatives were explained
to the patient. Questions regarding the procedure were encouraged
and answered. The patient understands and consents to the procedure.

[Series 1: sequence · 4 of 27 frames shown]
[frame 4/27]
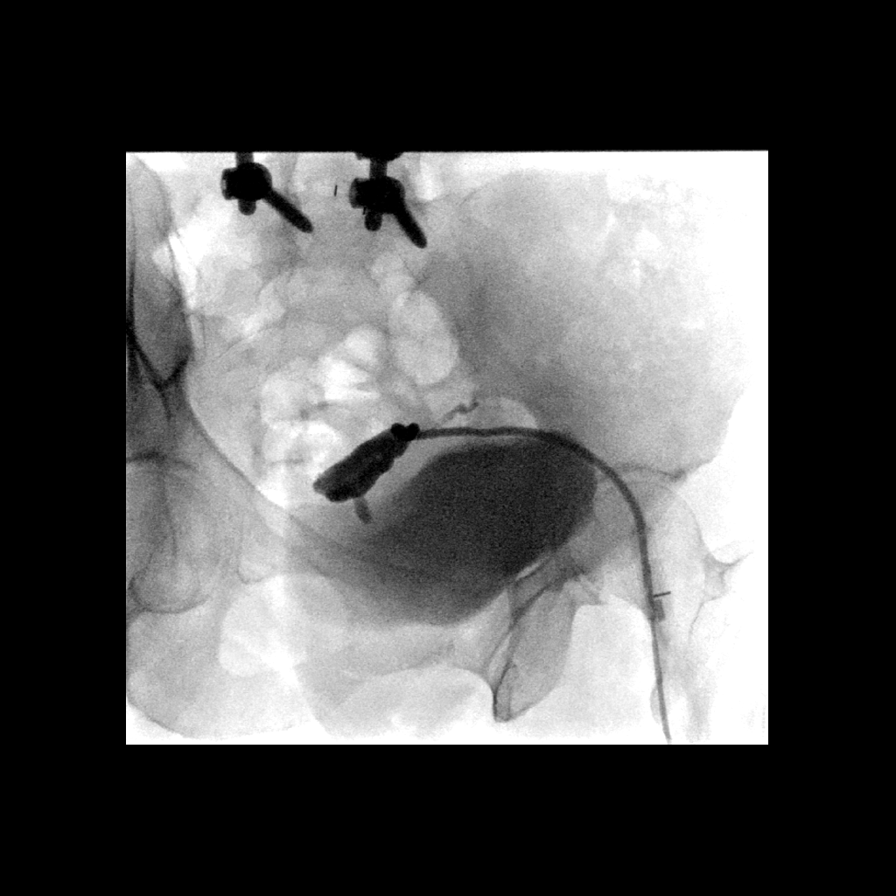
[frame 5/27]
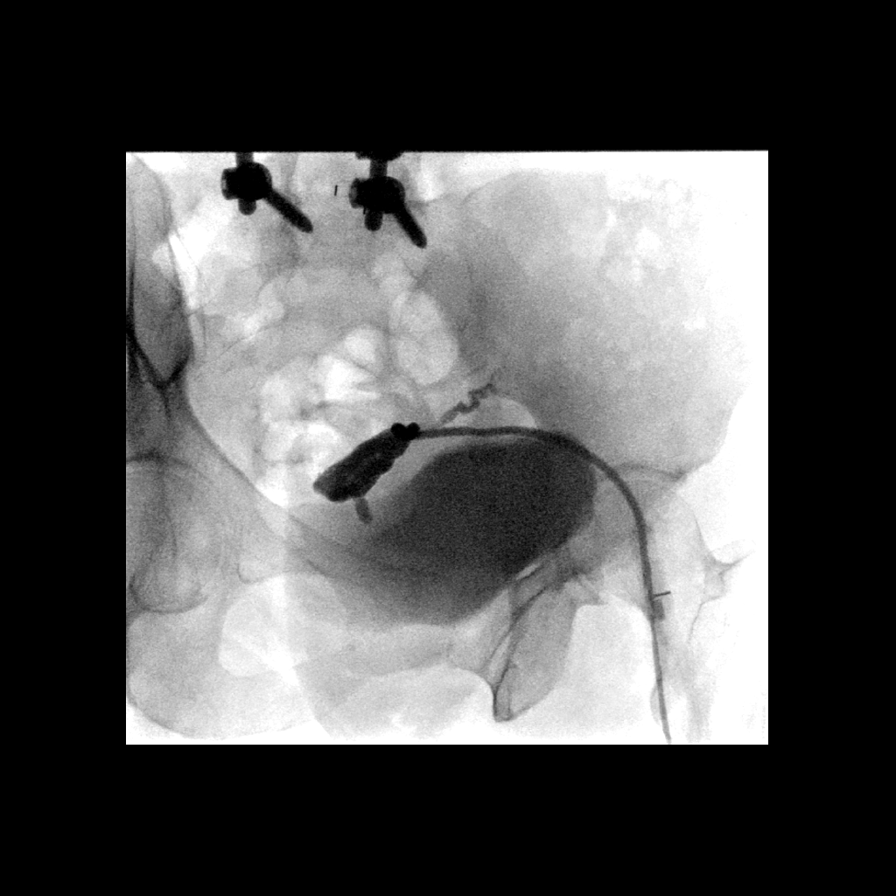
[frame 14/27]
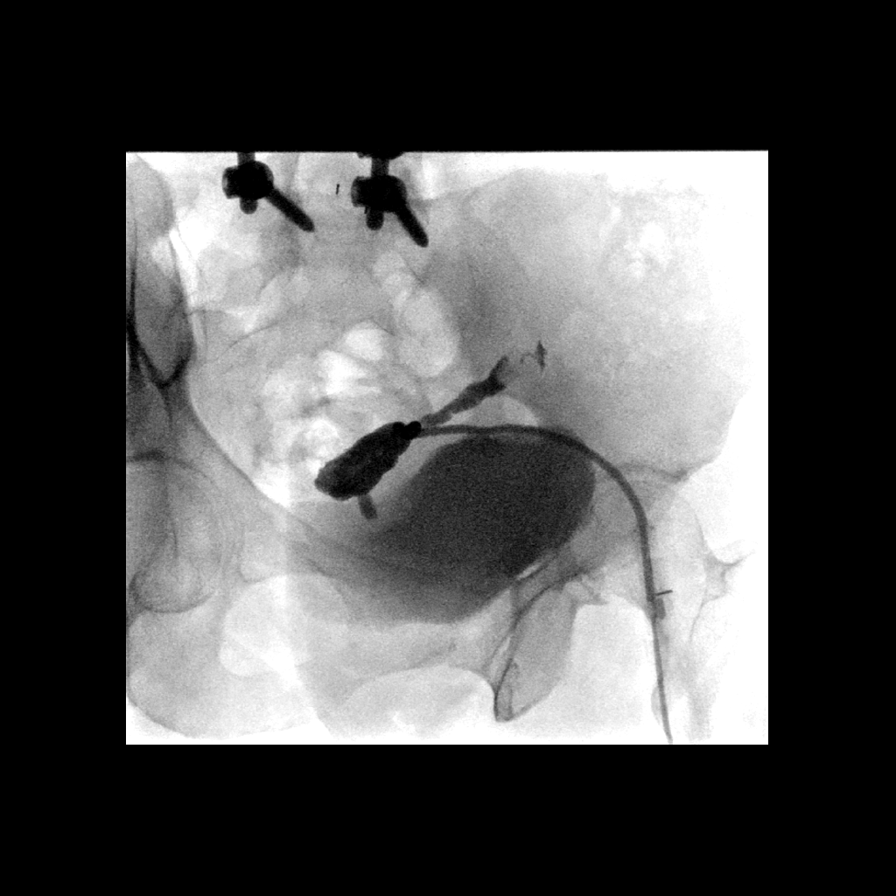
[frame 23/27]
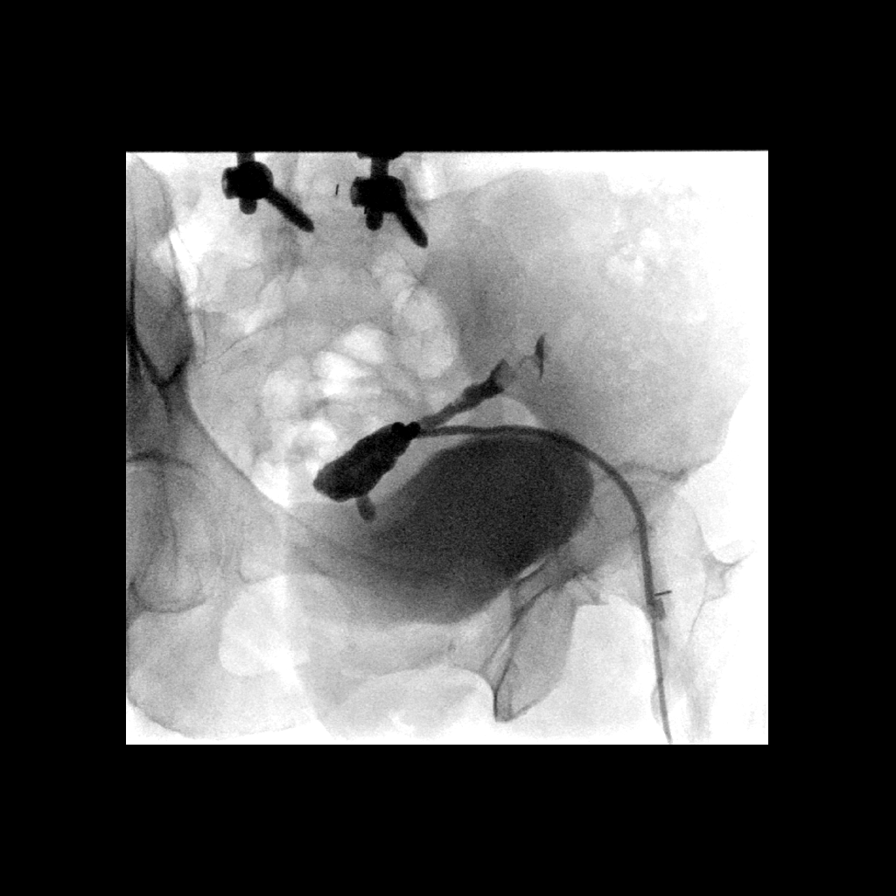

[Series 2: one shot · 0.14mm/px · 3 of 3 slices shown]
[im 1/3]
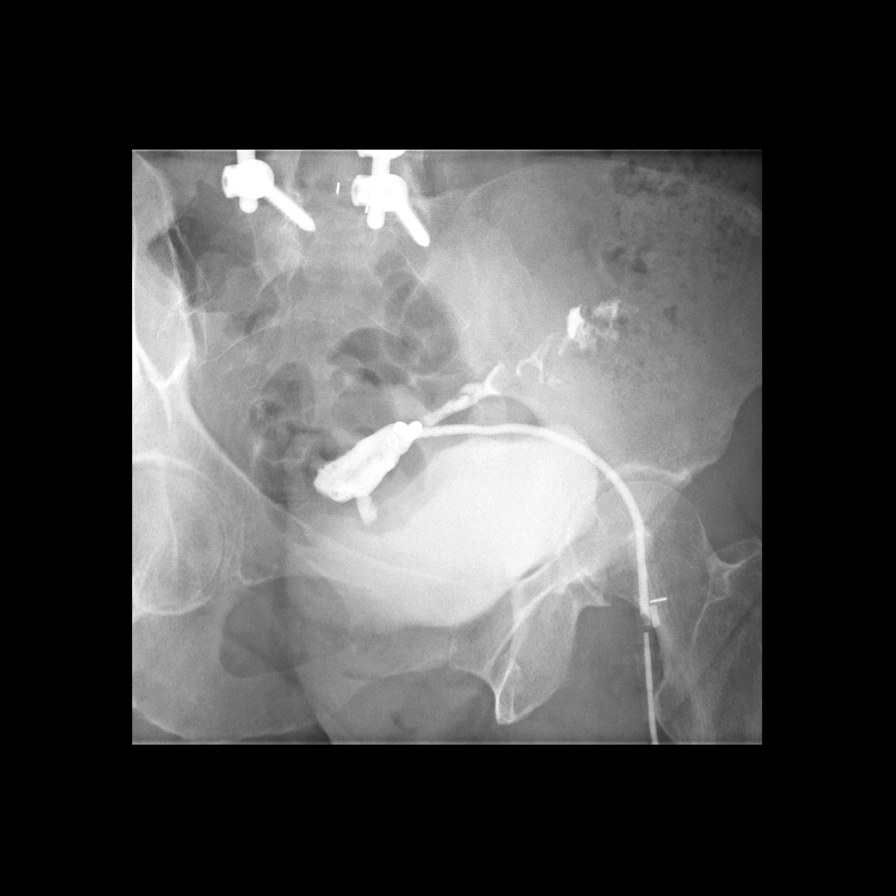
[im 2/3]
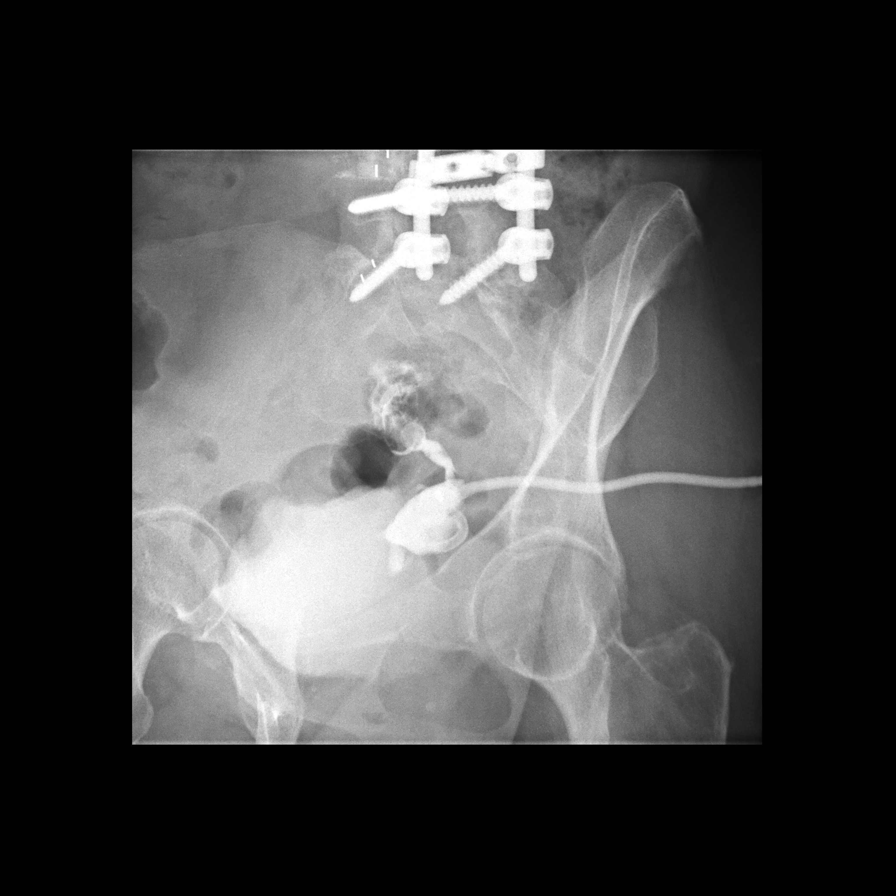
[im 3/3]
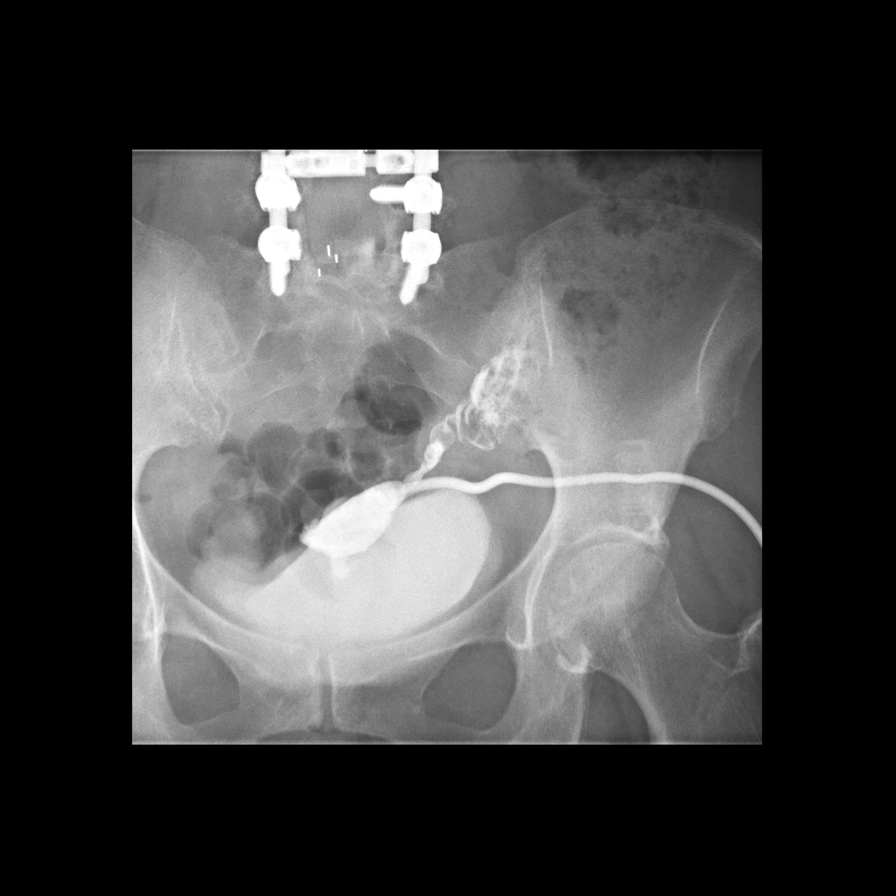

[7 of 7 positions shown; findings below may reference images not displayed]

Survey fluoroscopic inspection reveals stable position of the right
transgluteal pigtail drain catheter.

Injection demonstrates significant resolution of the abscess cavity
with only a small residual around the drain catheter. There is
direct communication to the lumen of the appendix however, flowing
on into the cecum.
IMPRESSION: 1. Resolution of pelvic abscess with persistent fistula to appendix.

PLAN:
The catheter will remain in place for now; continued daily low
volume saline flush, otherwise maintain to gravity drain bag.

Follow-up with surgery.

## 2022-01-30 IMAGING — DX DG CHEST 1V PORT
1 series · 1 of 1 positions shown · non-contrast
Comparison: 09/30/2020

CLINICAL DATA: Increasing shortness of breath.  COVID positive.

EXAM:
PORTABLE CHEST 1 VIEW

[chest ap]
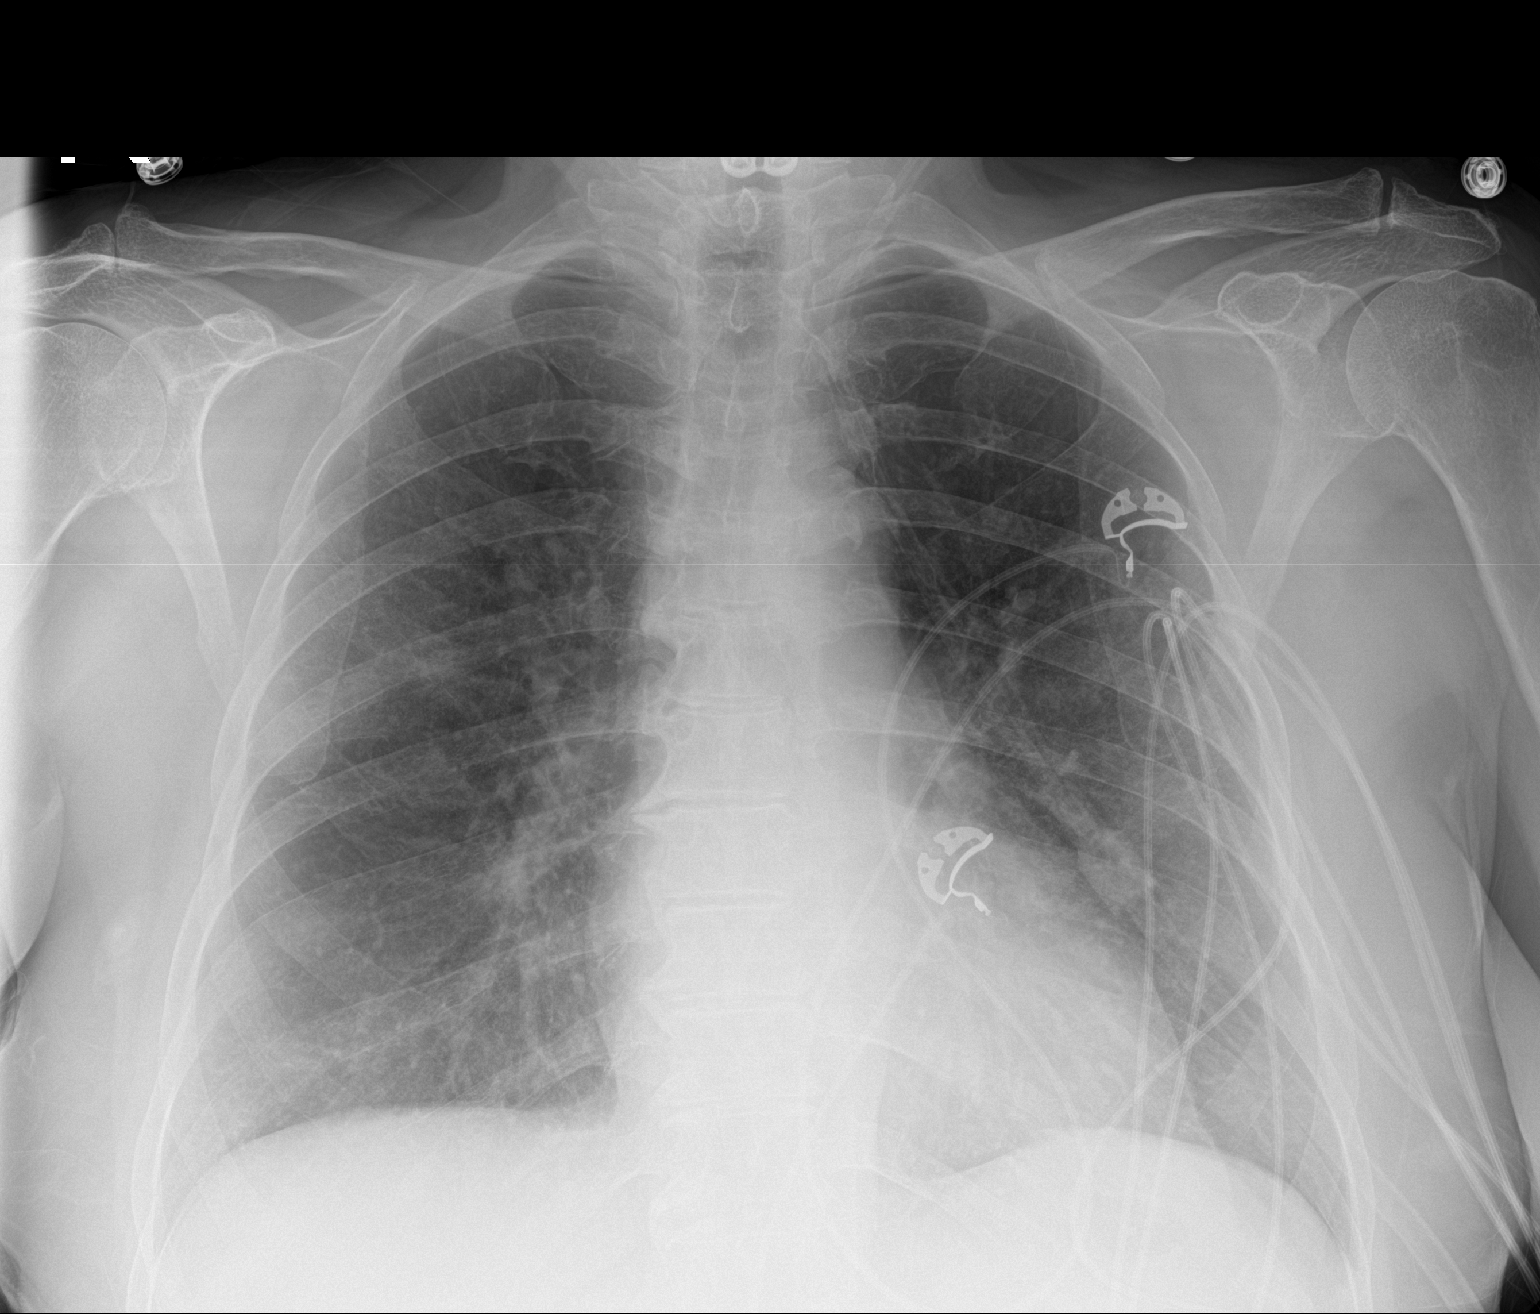

[1 of 1 positions shown; findings below may reference images not displayed]

FINDINGS: The exam is essentially stable with hazy bilateral airspace
opacities. No pneumothorax. No large pleural effusion. The heart
size is stable. There is no acute osseous abnormality.
Atherosclerotic changes are noted of the thoracic aorta.
IMPRESSION: No significant interval change.

## 2022-02-02 IMAGING — DX DG CHEST 1V PORT
1 series · 1 of 1 positions shown · non-contrast
Comparison: October 03, 2020.

CLINICAL DATA: Shortness of breath.

EXAM:
PORTABLE CHEST 1 VIEW

[chest ap]
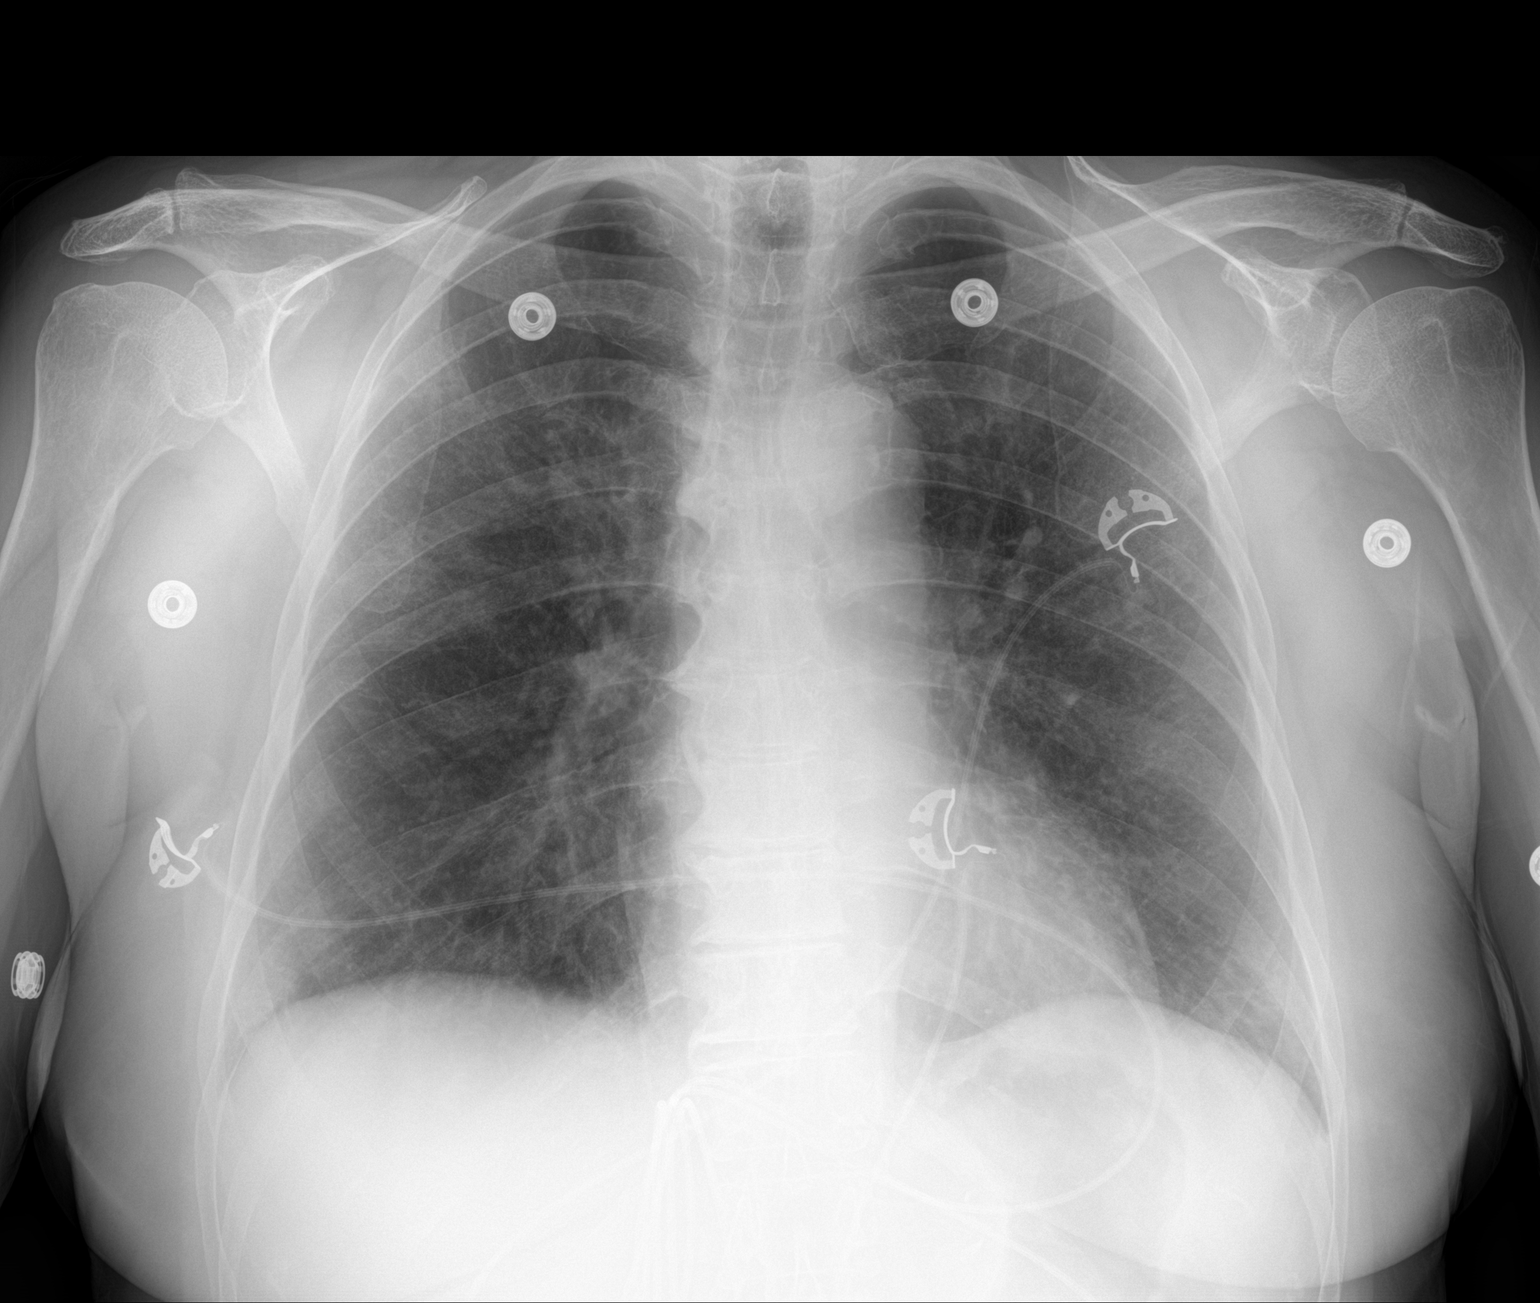

[1 of 1 positions shown; findings below may reference images not displayed]

FINDINGS: The heart size and mediastinal contours are within normal limits. No
pneumothorax or pleural effusion is noted. Left lung is clear.
Stable minimal right upper lobe opacity is noted which may represent
atelectasis, infiltrate or scarring. The visualized skeletal
structures are unremarkable.
IMPRESSION: Stable minimal right upper lobe opacity is noted which may represent
atelectasis, infiltrate or scarring.

## 2022-02-05 IMAGING — DX DG CHEST 1V PORT
1 series · 1 of 1 positions shown · non-contrast
Comparison: October 06, 2020

CLINICAL DATA: 6NF8H-HF positive.  Shortness of breath.

EXAM:
PORTABLE CHEST 1 VIEW

[chest ap]
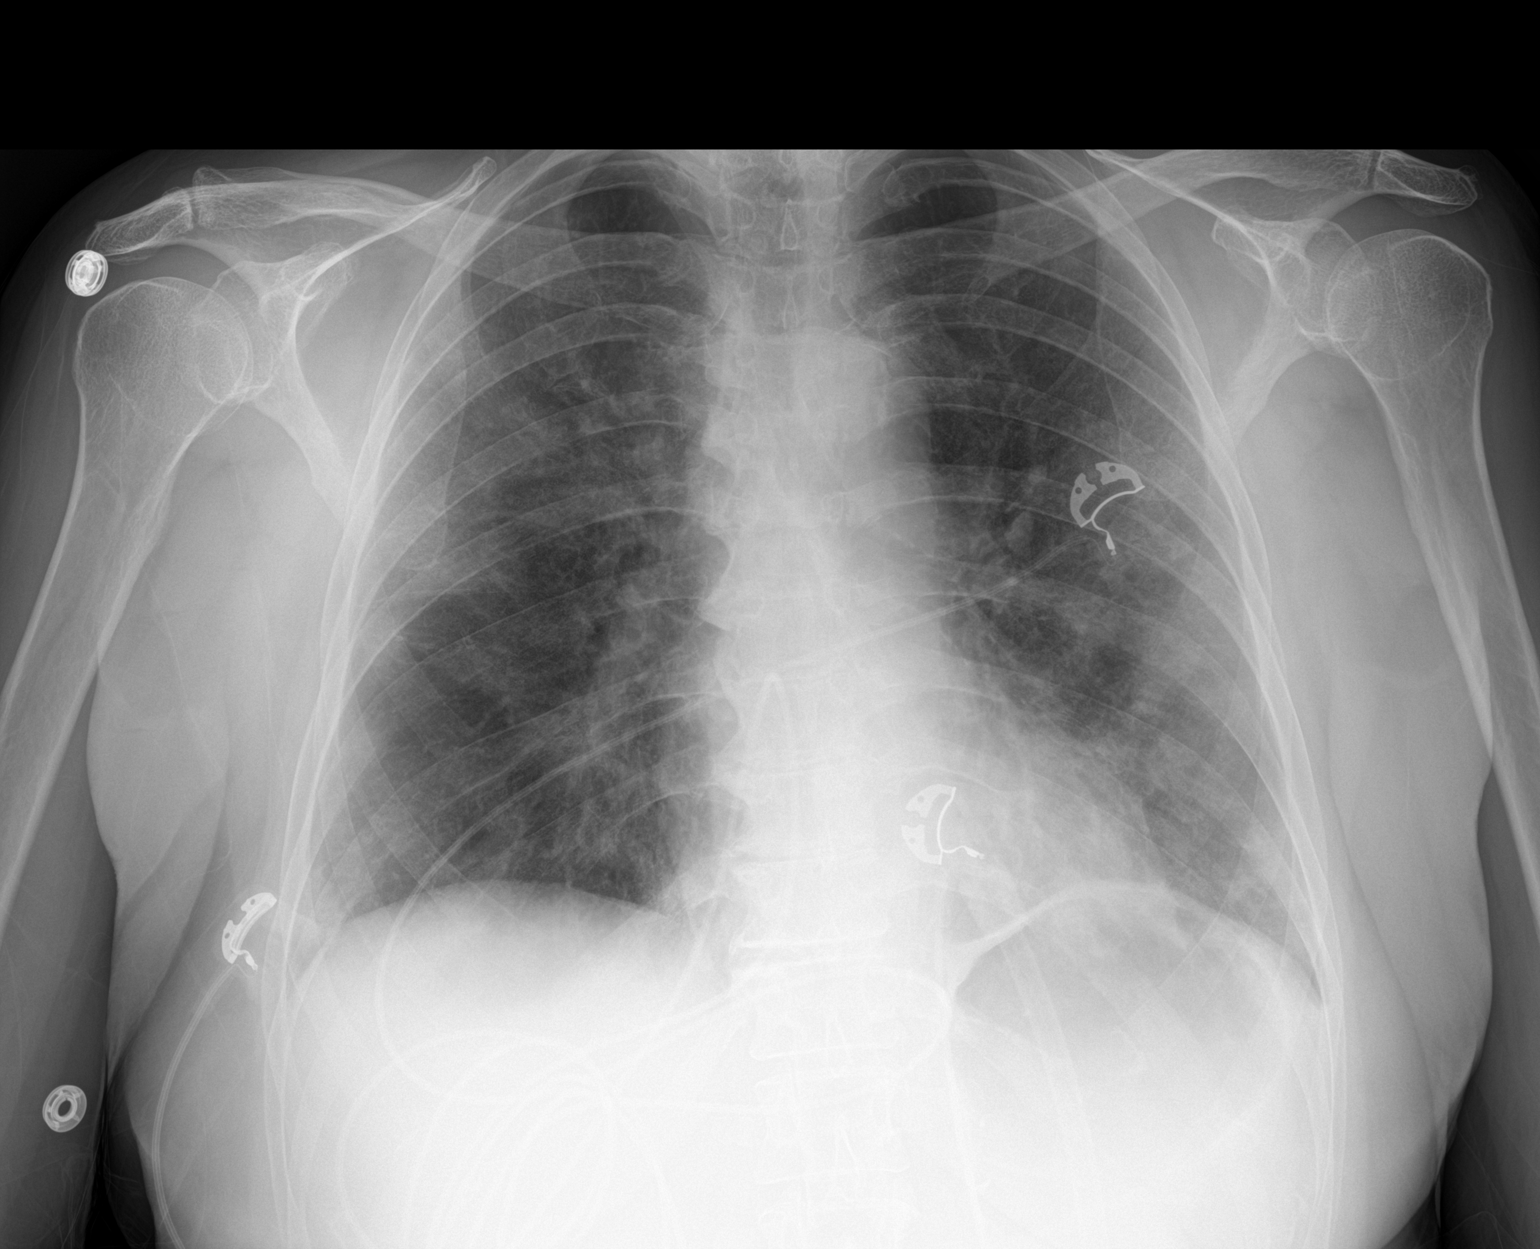

[1 of 1 positions shown; findings below may reference images not displayed]

FINDINGS: The heart, hila, and mediastinum are normal. No pneumothorax.
Bilateral pulmonary infiltrates have worsened on the right and are
new on the left in the interval. No other interval changes.
IMPRESSION: Bilateral pulmonary infiltrates consistent with worsening 6NF8H-HF
pneumonia.

## 2022-05-01 DIAGNOSIS — N1832 Chronic kidney disease, stage 3b: Secondary | ICD-10-CM | POA: Insufficient documentation

## 2022-11-27 ENCOUNTER — Ambulatory Visit (INDEPENDENT_AMBULATORY_CARE_PROVIDER_SITE_OTHER): Payer: Medicare Other | Admitting: Pulmonary Disease

## 2022-11-27 ENCOUNTER — Encounter: Payer: Self-pay | Admitting: Pulmonary Disease

## 2022-11-27 VITALS — BP 120/68 | HR 79 | Temp 98.1°F | Ht 64.0 in | Wt 149.2 lb

## 2022-11-27 DIAGNOSIS — J449 Chronic obstructive pulmonary disease, unspecified: Secondary | ICD-10-CM | POA: Diagnosis not present

## 2022-11-27 DIAGNOSIS — J984 Other disorders of lung: Secondary | ICD-10-CM | POA: Diagnosis not present

## 2022-11-27 DIAGNOSIS — Z8616 Personal history of COVID-19: Secondary | ICD-10-CM | POA: Diagnosis not present

## 2022-11-27 DIAGNOSIS — Z87891 Personal history of nicotine dependence: Secondary | ICD-10-CM

## 2022-11-27 DIAGNOSIS — R942 Abnormal results of pulmonary function studies: Secondary | ICD-10-CM

## 2022-11-27 NOTE — Progress Notes (Signed)
Synopsis: Referred in February 2022 for COPD evaluation, PCP: By [name], MD  Subjective:   PATIENT ID: Jean Shaffer GENDER: female DOB: November 29, 1951, MRN: [id number]  Chief Complaint  Patient presents with   Follow-up    Getting over bronchitis     This is a 71 year old female with a past medical history of COPD diagnosed in 2014, hypertension, hypothyroidism,?  TIA, asthma.  Patient is a former smoker quit in 2000, smoking less than half pack a day..  Patient presents to primary care office on 12/27/2020, office note reviewed by Dr. Doreene Nest today in the office.  Had a prolonged hospitalization and rehabilitation following Covid 19 recovery.  Recently discontinued home physical therapy monitoring O2 sats remained in the low 90s.  Referred for evaluation by pulmonary regarding COVID-19 diagnosis and COPD diagnosis.  Follow-up also in the office on 12/31/2020 by Dr. Alice Rieger at physical medicine rehabilitation.  At this time still struggling with poor endurance and debility.  But she seems to be slowly recovering.  Counseled at that time on vaccine for COVID-19.  Patient was admitted to the hospital on 09/30/2020.  Prior to admission did receive monoclonal antibody infusion which was given at [facility].  Documentation of this timeline of events was reviewed today in the patient's electronic medical record.  OV 01/26/2021: Here today for evaluation. Since last office visit with primary care patient has been doing much better. She is breathing much better. She feels like she is really turned a corner. After nearly spending 40 days in the hospital related to COVID-19. She states that she did have office spirometry many years ago was diagnosed with COPD. Not on any medications at this time. No recent full PFTs. Chest x-rays from hospitalization reviewed today in the office. Still using 1 L nasal cannula nightly with sleep. She monitors her O2 sats every morning which have been above 90%.  OV  03/23/2021: Here today for follow-up after recent pulmonary function test completed prior to office visit.  Reviewed with patient today in the office.  Test revealed a FVC of 77%, FEV1 of 88% predicted, ratio of 87, TLC 65%, DLCO of 60%.  She does have some mild evidence of centrilobular emphysema.  She has predominant interstitial changes from her Covid diagnosis.  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  She also has a moderate reduced DLCO.  From a respiratory standpoint she is doing much better.  She is able to complete her activities of daily living.  She does have some shortness of breath with significant exertion but she has tried to return to most of her normal life activities.  Denies fever chills night sweats weight loss hemoptysis.  OV 11/27/2022: Here today for follow-up.  She has been doing really well since we last saw each other.  She did have some interstitial changes related to COVID.  She does have pulmonary function test with a mixed restrictive and obstructive defect.  She is doing well on Incruse.  She is a former smoker quit several years ago.  She is feeling like she is able to do most of her activities of daily living.  Working with her and spending time with her grandchildren.  She did have a recent bout of bronchitis that she has recovered from.    Past Medical History:  Diagnosis Date   Anemia    Arthritis    osteoarthritis in knees and hands ~2016 dx   Asthma  Chronic back pain    COPD (chronic obstructive pulmonary disease) (HCC)    per pt dx in ~2014   Heart murmur    as a child per pt   Hypertension    Hypothyroidism    Stroke ([place])    per pt "maybe last year or the year before last, they told me I might've had a TIA, but I blacked out"   Thyroid disease      Family History  Problem Relation Age of Onset   Alcohol abuse Father    Cancer Father    Heart disease Father    Cancer Mother    Cancer Maternal Grandfather     Heart disease Maternal Grandfather    Heart disease Paternal Grandmother    Diabetes Paternal Grandmother    Diabetes Paternal Grandfather      Past Surgical History:  Procedure Laterality Date   ABDOMINAL HYSTERECTOMY     CATARACT EXTRACTION W/ INTRAOCULAR LENS IMPLANT Left 07/14/2014   CATARACT EXTRACTION W/ INTRAOCULAR LENS IMPLANT Right 10/18/2015   COLONOSCOPY     EYE SURGERY     IR RADIOLOGIST EVAL & MGMT  01/08/2020   LAPAROSCOPIC APPENDECTOMY N/A 01/29/2020   Procedure: LAPAROSCOPIC  APPENDECTOMY;  Surgeon: Rolm Bookbinder, MD;  Location: [place];  Service: General;  Laterality: N/A;   MULTIPLE TOOTH EXTRACTIONS  2002   [place] EXTRACTION     per pt 01/21/20    Social History   Socioeconomic History   Marital status: Single    Spouse name: Not on file   Number of children: Not on file   Years of education: Not on file   Highest education level: Not on file  Occupational History   Not on file  Tobacco Use   Smoking status: Former   Smokeless tobacco: Never  Vaping Use   Vaping Use: Never used  Substance and Sexual Activity   Alcohol use: No   Drug use: No   Sexual activity: Not on file  Other Topics Concern   Not on file  Social History Narrative   Not on file   Social Determinants of Health   Financial Resource Strain: Not on file  Food Insecurity: Not on file  Transportation Needs: Not on file  Physical Activity: Not on file  Stress: Not on file  Social Connections: Not on file  Intimate Partner Violence: Not on file     No Known Allergies   Outpatient Medications Prior to Visit  Medication Sig Dispense Refill   acetaminophen (TYLENOL) 325 MG tablet Take 1-2 tablets (325-650 mg total) by mouth every 4 (four) hours as needed for mild pain.     albuterol (VENTOLIN HFA) 108 (90 Base) MCG/ACT inhaler Inhale into the lungs.     amLODipine (NORVASC) 2.5 MG tablet Take 5 mg by mouth daily.     buPROPion  (WELLBUTRIN XL) 150 MG 24 hr tablet Take 150 mg by mouth daily.      levothyroxine (SYNTHROID, LEVOTHROID) 50 MCG tablet Take 50 mcg by mouth daily before breakfast.      Multiple Vitamin (MULTIVITAMIN) LIQD Take 15 mLs by mouth daily. 30 mL 0   polyethylene glycol (MIRALAX / GLYCOLAX) 17 g packet Take 17 g by mouth daily. (Patient taking differently: Take 17 g by mouth daily as needed for mild constipation.) 14 each 0   senna-docusate (SENOKOT-S) 8.6-50 MG tablet Take 2 tablets by mouth daily with supper.  60 tablet 0   traZODone (DESYREL) 100 MG tablet Take 100 mg by mouth at bedtime as needed for sleep.      umeclidinium bromide (INCRUSE ELLIPTA) 62.5 MCG/INH AEPB Inhale 1 puff into the lungs daily. 1 each 0   valACYclovir (VALTREX) 500 MG tablet Take 500 mg by mouth See admin instructions. Take 1 tablet by mouth twice daily for 3 days during outbreak     ascorbic acid (VITAMIN C) 500 MG tablet Take 1 tablet (500 mg total) by mouth daily. (Patient not taking: Reported on 11/27/2022) 1 tablet 0   aspirin EC 81 MG tablet Take 81 mg by mouth daily. (Patient not taking: Reported on 11/27/2022)     calcium carbonate (TUMS - DOSED IN MG ELEMENTAL CALCIUM) 500 MG chewable tablet Chew 1 tablet (200 mg of elemental calcium total) by mouth 2 (two) times daily as needed for indigestion or heartburn. (Patient not taking: Reported on 11/27/2022) 1 tablet 0   hydrOXYzine (ATARAX/VISTARIL) 25 MG tablet Take 1 tablet (25 mg total) by mouth 3 (three) times daily as needed for anxiety. (Patient not taking: Reported on 11/27/2022) 30 tablet 0   lip balm (CARMEX) ointment Apply topically as needed for lip care. (Patient not taking: Reported on 11/27/2022) 7 g 0   melatonin 3 MG TABS tablet Take 1 tablet (3 mg total) by mouth at bedtime. (Patient not taking: Reported on 11/27/2022) 30 tablet 0   meloxicam (MOBIC) 15 MG tablet Take 15 mg by mouth daily. (Patient not taking: Reported on 11/27/2022)     traMADol (ULTRAM) 50 MG  tablet Take 1 tablet (50 mg total) by mouth every 6 (six) hours as needed for severe pain. (Patient not taking: Reported on 11/27/2022) 20 tablet 0   zinc sulfate 220 (50 Zn) MG capsule Take 1 capsule (220 mg total) by mouth daily. (Patient not taking: Reported on 11/27/2022) 1 capsule 0   No facility-administered medications prior to visit.    Review of Systems  Constitutional:  Negative for chills, fever, malaise/fatigue and weight loss.  HENT:  Negative for hearing loss, sore throat and tinnitus.   Eyes:  Negative for blurred vision and double vision.  Respiratory:  Negative for cough, hemoptysis, sputum production, shortness of breath, wheezing and stridor.   Cardiovascular:  Negative for chest pain, palpitations, orthopnea, leg swelling and PND.  Gastrointestinal:  Negative for abdominal pain, constipation, diarrhea, heartburn, nausea and vomiting.  Genitourinary:  Negative for dysuria, hematuria and urgency.  Musculoskeletal:  Negative for joint pain and myalgias.  Skin:  Negative for itching and rash.  Neurological:  Negative for dizziness, tingling, weakness and headaches.  Endo/Heme/Allergies:  Negative for environmental allergies. Does not bruise/bleed easily.  Psychiatric/Behavioral:  Negative for depression. The patient is not nervous/anxious and does not have insomnia.   All other systems reviewed and are negative.    Objective:  Physical Exam Vitals reviewed.  Constitutional:      General: She is not in acute distress.    Appearance: She is well-developed.  HENT:     Head: Normocephalic and atraumatic.  Eyes:     General: No scleral icterus.    Conjunctiva/sclera: Conjunctivae normal.     Pupils: Pupils are equal, round, and reactive to light.  Neck:     Vascular: No JVD.     Trachea: No tracheal deviation.  Cardiovascular:     Rate and Rhythm: Normal rate and regular rhythm.     Heart sounds: Normal heart sounds. No murmur heard.  Pulmonary:     Effort: Pulmonary  effort is normal. No tachypnea, accessory muscle usage or respiratory distress.     Breath sounds: No stridor. No wheezing, rhonchi or rales.  Abdominal:     General: There is no distension.     Palpations: Abdomen is soft.     Tenderness: There is no abdominal tenderness.  Musculoskeletal:        General: No tenderness.     Cervical back: Neck supple.  Lymphadenopathy:     Cervical: No cervical adenopathy.  Skin:    General: Skin is warm and dry.     Capillary Refill: Capillary refill takes less than 2 seconds.     Findings: No rash.  Neurological:     Mental Status: She is alert and oriented to person, place, and time.  Psychiatric:        Behavior: Behavior normal.      Vitals:   11/27/22 0912  BP: 120/68  Pulse: 79  Temp: 98.1 F (36.7 C)  TempSrc: Oral  SpO2: 96%  Weight: 149 lb 3.2 oz (67.7 kg)  Height: '5\' 4"'$  (1.626 m)    96% on RA BMI Readings from Last 3 Encounters:  11/27/22 25.61 kg/m  03/23/21 25.06 kg/m  01/26/21 23.89 kg/m   Wt Readings from Last 3 Encounters:  11/27/22 149 lb 3.2 oz (67.7 kg)  03/23/21 146 lb (66.2 kg)  01/26/21 141 lb 6 oz (64.1 kg)     CBC    Component Value Date/Time   WBC 11.0 (H) 11/01/2020 0813   RBC 3.43 (L) 11/01/2020 0813   HGB 9.8 (L) 11/01/2020 0813   HCT 31.1 (L) 11/01/2020 0813   PLT 208 11/01/2020 0813   MCV 90.7 11/01/2020 0813   MCH 28.6 11/01/2020 0813   MCHC 31.5 11/01/2020 0813   RDW 15.3 11/01/2020 0813   LYMPHSABS 2.8 10/26/2020 0610   MONOABS 1.2 (H) 10/26/2020 0610   EOSABS 0.1 10/26/2020 0610   BASOSABS 0.0 10/26/2020 0610    Chest Imaging: October 2021 chest x-ray: 10/16/2020 chest x-ray Bilateral airspace disease infiltrates consistent with COVID-19 pneumonia. The patient's images have been independently reviewed by me.    Pulmonary Functions Testing Results:    Latest Ref Rng & Units 03/23/2021   10:04 AM  PFT Results  FVC-Pre L 2.24   FVC-Predicted Pre % 74   FVC-Post L 2.32    FVC-Predicted Post % 77   Pre FEV1/FVC % % 85   Post FEV1/FCV % % 87   FEV1-Pre L 1.92   FEV1-Predicted Pre % 84   FEV1-Post L 2.01   DLCO uncorrected ml/min/mmHg 11.75   DLCO UNC% % 60   DLCO corrected ml/min/mmHg 11.75   DLCO COR %Predicted % 60   DLVA Predicted % 90   TLC L 3.31   TLC % Predicted % 65   RV % Predicted % 46   Pulmonary function tests 03/23/2021: Reviewed and interpreted today in the office with patient.  FeNO:   Pathology:   Echocardiogram:  .  10/04/2020 lower extremity duplex: Negative for DVT.  Results reviewed  Heart Catheterization:     Assessment & Plan:     ICD-10-CM   1. Chronic obstructive pulmonary disease, unspecified COPD type ([place])  J44.9     2. Former smoker  Z87.891     3. History of COVID-19  Z86.16 CT CHEST HIGH RESOLUTION    4. Restrictive lung disease  J98.4 CT CHEST HIGH RESOLUTION    5.  Mixed obstructive and restrictive ventilatory defect  R94.2 CT CHEST HIGH RESOLUTION    6. Decreased diffusion capacity  R94.2       Discussion:  This is a 71 year old female, had a previous hospital admission for COVID-19, 40-day plus hospital stay, discharged on home oxygen therapy this was in 2021.  She had a follow-up pulmonary function test that demonstrated a mixed obstructive restrictive pattern with a reduced DLCO.  She also is a former smoker.  Plan: Continue Incruse Continue albuterol as needed I think since her functional status is good and her symptoms are minimal we can hold off on repeating PFTs at this time.  I am not sure that it would change management. We will follow her up with a repeat HRCT scan of the chest since she has not had any axial imaging to look for any significant scarring or early changes of fibrosis related to her COVID-19 history.  We will have this complete in 1 year prior to her next office visit. Patient is agreeable to this plan. RTC in 1 year after HRCT.    Current Outpatient Medications:     acetaminophen (TYLENOL) 325 MG tablet, Take 1-2 tablets (325-650 mg total) by mouth every 4 (four) hours as needed for mild pain., Disp: , Rfl:    albuterol (VENTOLIN HFA) 108 (90 Base) MCG/ACT inhaler, Inhale into the lungs., Disp: , Rfl:    amLODipine (NORVASC) 2.5 MG tablet, Take 5 mg by mouth daily., Disp: , Rfl:    buPROPion (WELLBUTRIN XL) 150 MG 24 hr tablet, Take 150 mg by mouth daily. , Disp: , Rfl:    levothyroxine (SYNTHROID, LEVOTHROID) 50 MCG tablet, Take 50 mcg by mouth daily before breakfast. , Disp: , Rfl:    Multiple Vitamin (MULTIVITAMIN) LIQD, Take 15 mLs by mouth daily., Disp: 30 mL, Rfl: 0   polyethylene glycol (MIRALAX / GLYCOLAX) 17 g packet, Take 17 g by mouth daily. (Patient taking differently: Take 17 g by mouth daily as needed for mild constipation.), Disp: 14 each, Rfl: 0   senna-docusate (SENOKOT-S) 8.6-50 MG tablet, Take 2 tablets by mouth daily with supper., Disp: 60 tablet, Rfl: 0   traZODone (DESYREL) 100 MG tablet, Take 100 mg by mouth at bedtime as needed for sleep. , Disp: , Rfl:    umeclidinium bromide (INCRUSE ELLIPTA) 62.5 MCG/INH AEPB, Inhale 1 puff into the lungs daily., Disp: 1 each, Rfl: 0   valACYclovir (VALTREX) 500 MG tablet, Take 500 mg by mouth See admin instructions. Take 1 tablet by mouth twice daily for 3 days during outbreak, Disp: , Rfl:    ascorbic acid (VITAMIN C) 500 MG tablet, Take 1 tablet (500 mg total) by mouth daily. (Patient not taking: Reported on 11/27/2022), Disp: 1 tablet, Rfl: 0   aspirin EC 81 MG tablet, Take 81 mg by mouth daily. (Patient not taking: Reported on 11/27/2022), Disp: , Rfl:    calcium carbonate (TUMS - DOSED IN MG ELEMENTAL CALCIUM) 500 MG chewable tablet, Chew 1 tablet (200 mg of elemental calcium total) by mouth 2 (two) times daily as needed for indigestion or heartburn. (Patient not taking: Reported on 11/27/2022), Disp: 1 tablet, Rfl: 0   hydrOXYzine (ATARAX/VISTARIL) 25 MG tablet, Take 1 tablet (25 mg total) by mouth 3  (three) times daily as needed for anxiety. (Patient not taking: Reported on 11/27/2022), Disp: 30 tablet, Rfl: 0   lip balm (CARMEX) ointment, Apply topically as needed for lip care. (Patient not taking: Reported on 11/27/2022), Disp: 7  g, Rfl: 0   melatonin 3 MG TABS tablet, Take 1 tablet (3 mg total) by mouth at bedtime. (Patient not taking: Reported on 11/27/2022), Disp: 30 tablet, Rfl: 0   meloxicam (MOBIC) 15 MG tablet, Take 15 mg by mouth daily. (Patient not taking: Reported on 11/27/2022), Disp: , Rfl:    traMADol (ULTRAM) 50 MG tablet, Take 1 tablet (50 mg total) by mouth every 6 (six) hours as needed for severe pain. (Patient not taking: Reported on 11/27/2022), Disp: 20 tablet, Rfl: 0   zinc sulfate 220 (50 Zn) MG capsule, Take 1 capsule (220 mg total) by mouth daily. (Patient not taking: Reported on 11/27/2022), Disp: 1 capsule, Rfl: 0   Garner Nash, DO [facility] 11/27/2022 9:27 AM

## 2022-11-27 NOTE — Patient Instructions (Signed)
Thank you for visiting Dr. Valeta Harms at [facility]. Today we recommend the following:  Orders Placed This Encounter  Procedures   CT CHEST HIGH RESOLUTION   CT Chest in 1 year prior to next office appt   Return in about 1 year (around 11/28/2023) for with APP or Dr. Valeta Harms.    Please do your part to reduce the spread of COVID-19.

## 2022-12-22 ENCOUNTER — Ambulatory Visit
Admission: EM | Admit: 2022-12-22 | Discharge: 2022-12-22 | Disposition: A | Discharge status: Home / Self Care | Payer: Medicare Other | Attending: Physician Assistant | Admitting: Physician Assistant

## 2022-12-22 DIAGNOSIS — U071 COVID-19: Secondary | ICD-10-CM | POA: Diagnosis not present

## 2022-12-22 DIAGNOSIS — J069 Acute upper respiratory infection, unspecified: Secondary | ICD-10-CM

## 2022-12-22 DIAGNOSIS — R051 Acute cough: Secondary | ICD-10-CM | POA: Diagnosis not present

## 2022-12-22 MED ORDER — NIRMATRELVIR/RITONAVIR (PAXLOVID)TABLET: 3.0000 | ORAL_TABLET | Freq: Two times a day (BID) | ORAL | 0 refills | Status: AC

## 2022-12-22 MED ORDER — DM-GUAIFENESIN ER 30-600 MG PO TB12: 1.0000 | ORAL_TABLET | Freq: Two times a day (BID) | ORAL | 0 refills | Status: DC

## 2022-12-22 NOTE — Discharge Instructions (Signed)
Advised take the Mucinex DM every 12 hours to help control the cough and congestion. Advise use albuterol inhaler, 2 puffs every 6 hours on a regular basis to help control cough, chest congestion and shortness of breath. Advised take the Paxlovid, 3 tablets twice daily for 5 days to treat COVID.  Advised to follow-up with PCP or return to urgent care if symptoms fail to improve over the next 7 days.

## 2022-12-22 NOTE — ED Provider Notes (Signed)
[facility]    CSN: [id number] Arrival date & time: 12/22/22  1126      History   Chief Complaint Chief Complaint  Patient presents with   Covid Positive    HPI Jean Shaffer is a 71 y.o. female.   71 year old female presents with fever, cough and congestion.  Patient indicates for the past 2 days she has been having intermittent fever 100-101.  She relates she has been having upper respiratory congestion with rhinitis and postnasal drip, mainly clear production.  Patient also indicates that she is having chest congestion with coughing episodes and spasms, relates that the production is clear to yellow.  Patient indicates that she is having fever, chills, body aches and fatigue.  Patient indicates that she did a at home COVID test this morning and it was positive.  Patient indicates she has not been around any family members or friends that have been sick recently.  Patient denies any recent travel.  Patient indicates she has been vaccinated against COVID but did not receive a booster this year.  Patient indicates that she does have a history of having COPD, hypertension, and long COVID.  Patient indicates she does have an albuterol inhaler that she is using at home to reduce the cough and prevent shortness of breath.  She denies any nausea or vomiting.     Past Medical History:  Diagnosis Date   Anemia    Arthritis    osteoarthritis in knees and hands ~2016 dx   Asthma    Chronic back pain    COPD (chronic obstructive pulmonary disease) (HCC)    per pt dx in ~2014   Heart murmur    as a child per pt   Hypertension    Hypothyroidism    Stroke ([place])    per pt "maybe last year or the year before last, they told me I might've had a TIA, but I blacked out"   Thyroid disease     Patient Active Problem List   Diagnosis Date Noted   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   Persistent shortness of breath after COVID-19 11/02/2020   Insomnia 11/02/2020   Anxiety reaction  11/02/2020   Sprain of ulnar collateral ligament 11/02/2020   Protein-calorie malnutrition, severe 10/27/2020   Debility 10/25/2020   Acute respiratory failure with hypoxia ([place])    COVID-19 09/30/2020   S/P laparoscopic appendectomy 01/29/2020   Perforated appendicitis 12/27/2019   Palpitations 02/05/2019   Syncope 12/07/2018   HTN (hypertension) 12/07/2018   COPD (chronic obstructive pulmonary disease) ([place]) 12/07/2018    Past Surgical History:  Procedure Laterality Date   ABDOMINAL HYSTERECTOMY     CATARACT EXTRACTION W/ INTRAOCULAR LENS IMPLANT Left 07/14/2014   CATARACT EXTRACTION W/ INTRAOCULAR LENS IMPLANT Right 10/18/2015   COLONOSCOPY     EYE SURGERY     IR RADIOLOGIST EVAL & MGMT  01/08/2020   LAPAROSCOPIC APPENDECTOMY N/A 01/29/2020   Procedure: LAPAROSCOPIC  APPENDECTOMY;  Surgeon: Rolm Bookbinder, MD;  Location: [place];  Service: General;  Laterality: N/A;   MULTIPLE TOOTH EXTRACTIONS  2002   [place] EXTRACTION     per pt 01/21/20    OB History   No obstetric history on file.      Home Medications    Prior to Admission medications   Medication Sig Start Date End Date Taking? Authorizing Provider  dextromethorphan-guaiFENesin [facility] DM) 30-600 MG 12hr tablet Take 1 tablet  by mouth 2 (two) times daily. 12/22/22  Yes Nyoka Lint, PA-C  nirmatrelvir/ritonavir (PAXLOVID) 20 x 150 MG & 10 x '100MG'$  TABS Take 3 tablets by mouth 2 (two) times daily for 5 days. Patient GFR is >60. Take nirmatrelvir (150 mg) two tablets twice daily for 5 days and ritonavir (100 mg) one tablet twice daily for 5 days. 12/22/22 12/27/22 Yes Nyoka Lint, PA-C  acetaminophen (TYLENOL) 325 MG tablet Take 1-2 tablets (325-650 mg total) by mouth every 4 (four) hours as needed for mild pain. 10/26/20   Love, Ivan Anchors, PA-C  albuterol (VENTOLIN HFA) 108 (90 Base) MCG/ACT inhaler Inhale into the lungs. 04/23/20   [provider]  amLODipine (NORVASC)  2.5 MG tablet Take 5 mg by mouth daily.    [provider]  ascorbic acid (VITAMIN C) 500 MG tablet Take 1 tablet (500 mg total) by mouth daily. Patient not taking: Reported on 11/27/2022 10/26/20   Dana Allan I, MD  aspirin EC 81 MG tablet Take 81 mg by mouth daily. Patient not taking: Reported on 11/27/2022    [provider]  buPROPion (WELLBUTRIN XL) 150 MG 24 hr tablet Take 150 mg by mouth daily.  09/11/18   [provider]  calcium carbonate (TUMS - DOSED IN MG ELEMENTAL CALCIUM) 500 MG chewable tablet Chew 1 tablet (200 mg of elemental calcium total) by mouth 2 (two) times daily as needed for indigestion or heartburn. Patient not taking: Reported on 11/27/2022 10/25/20   Dana Allan I, MD  hydrOXYzine (ATARAX/VISTARIL) 25 MG tablet Take 1 tablet (25 mg total) by mouth 3 (three) times daily as needed for anxiety. Patient not taking: Reported on 11/27/2022 11/02/20   Love, Ivan Anchors, PA-C  levothyroxine (SYNTHROID, LEVOTHROID) 50 MCG tablet Take 50 mcg by mouth daily before breakfast.  09/06/18   [provider]  lip balm (CARMEX) ointment Apply topically as needed for lip care. Patient not taking: Reported on 11/27/2022 10/25/20   Dana Allan I, MD  melatonin 3 MG TABS tablet Take 1 tablet (3 mg total) by mouth at bedtime. Patient not taking: Reported on 11/27/2022 11/02/20   Love, Ivan Anchors, PA-C  meloxicam (MOBIC) 15 MG tablet Take 15 mg by mouth daily. Patient not taking: Reported on 11/27/2022    [provider]  Multiple Vitamin (MULTIVITAMIN) LIQD Take 15 mLs by mouth daily. 10/26/20   Dana Allan I, MD  polyethylene glycol (MIRALAX / GLYCOLAX) 17 g packet Take 17 g by mouth daily. Patient taking differently: Take 17 g by mouth daily as needed for mild constipation. 12/30/19   Meuth, Brooke A, PA-C  senna-docusate (SENOKOT-S) 8.6-50 MG tablet Take 2 tablets by mouth daily with supper. 11/02/20   Love, Ivan Anchors, PA-C  traMADol (ULTRAM)  50 MG tablet Take 1 tablet (50 mg total) by mouth every 6 (six) hours as needed for severe pain. Patient not taking: Reported on 11/27/2022 11/02/20   Love, Ivan Anchors, PA-C  traZODone (DESYREL) 100 MG tablet Take 100 mg by mouth at bedtime as needed for sleep.  03/01/18   [provider]  umeclidinium bromide (INCRUSE ELLIPTA) 62.5 MCG/INH AEPB Inhale 1 puff into the lungs daily. 10/26/20   Bonnell Public, MD  valACYclovir (VALTREX) 500 MG tablet Take 500 mg by mouth See admin instructions. Take 1 tablet by mouth twice daily for 3 days during outbreak 06/10/18   [provider]  zinc sulfate 220 (50 Zn) MG capsule Take 1 capsule (220 mg total) by  mouth daily. Patient not taking: Reported on 11/27/2022 10/26/20   Bonnell Public, MD    Family History Family History  Problem Relation Age of Onset   Alcohol abuse Father    Cancer Father    Heart disease Father    Cancer Mother    Cancer Maternal Grandfather    Heart disease Maternal Grandfather    Heart disease Paternal Grandmother    Diabetes Paternal Grandmother    Diabetes Paternal Grandfather     Social History Social History   Tobacco Use   Smoking status: Former   Smokeless tobacco: Never  Scientific laboratory technician Use: Never used  Substance Use Topics   Alcohol use: No   Drug use: No     Allergies   Patient has no known allergies.   Review of Systems Review of Systems  Constitutional:  Positive for chills, fatigue and fever.  HENT:  Positive for postnasal drip and rhinorrhea.   Respiratory:  Positive for cough.      Physical Exam Triage Vital Signs ED Triage Vitals  Enc Vitals Group     BP 12/22/22 1239 119/66     Pulse Rate 12/22/22 1239 94     Resp 12/22/22 1239 19     Temp 12/22/22 1239 98.8 F (37.1 C)     Temp Source 12/22/22 1239 Oral     SpO2 12/22/22 1239 98 %     Weight --      Height --      Head Circumference --      Peak Flow --      Pain Score 12/22/22 1237 3     Pain Loc  --      Pain Edu? --      Excl. in [place]? --    No data found.  Updated Vital Signs BP 119/66   Pulse 94   Temp 98.8 F (37.1 C) (Oral)   Resp 19   SpO2 98%   Visual Acuity Right Eye Distance:   Left Eye Distance:   Bilateral Distance:    Right Eye Near:   Left Eye Near:    Bilateral Near:     Physical Exam Constitutional:      Appearance: Normal appearance.  HENT:     Right Ear: Tympanic membrane and ear canal normal.     Left Ear: Tympanic membrane and ear canal normal.     Mouth/Throat:     Mouth: Mucous membranes are moist.     Pharynx: Oropharynx is clear.  Cardiovascular:     Rate and Rhythm: Normal rate and regular rhythm.     Heart sounds: Normal heart sounds.  Pulmonary:     Effort: Pulmonary effort is normal.     Breath sounds: Normal breath sounds and air entry. No wheezing, rhonchi or rales.  Lymphadenopathy:     Cervical: No cervical adenopathy.  Neurological:     Mental Status: She is alert.      UC Treatments / Results  Labs (all labs ordered are listed, but only abnormal results are displayed) Labs Reviewed - No data to display  EKG   Radiology No results found.  Procedures Procedures (including critical care time)  Medications Ordered in UC Medications - No data to display  Initial Impression / Assessment and Plan / UC Course  I have reviewed the triage vital signs and the nursing notes.  Pertinent labs & imaging results that were available during my care of the patient were reviewed by  me and considered in my medical decision making (see chart for details).    Plan: 1.  The COVID infection will be treated with the following: A.  Due to the patient's comorbidities she will be treated with Paxlovid to treat COVID. 2.  The upper respiratory tract infection be treated with the following: A.  Mucinex DM every 12 hours to treat cough and congestion. 3.  The acute cough will be treated with the following: A.  Albuterol inhaler, 2  puffs every 6 hours on a regular basis to help with the cough and chest congestion. B.  Mucinex DM every 12 hours to treat the cough and congestion. 4.  Patient advised follow-up PCP or return to urgent care if symptoms fail to improve over the next 7 days. Final Clinical Impressions(s) / UC Diagnoses   Final diagnoses:  COVID  Acute upper respiratory infection  Acute cough     Discharge Instructions      Advised take the Mucinex DM every 12 hours to help control the cough and congestion. Advise use albuterol inhaler, 2 puffs every 6 hours on a regular basis to help control cough, chest congestion and shortness of breath. Advised take the Paxlovid, 3 tablets twice daily for 5 days to treat COVID.  Advised to follow-up with PCP or return to urgent care if symptoms fail to improve over the next 7 days.    ED Prescriptions     Medication Sig Dispense Auth. Provider   dextromethorphan-guaiFENesin (MUCINEX DM) 30-600 MG 12hr tablet Take 1 tablet by mouth 2 (two) times daily. 20 tablet Nyoka Lint, PA-C   nirmatrelvir/ritonavir (PAXLOVID) 20 x 150 MG & 10 x '100MG'$  TABS Take 3 tablets by mouth 2 (two) times daily for 5 days. Patient GFR is >60. Take nirmatrelvir (150 mg) two tablets twice daily for 5 days and ritonavir (100 mg) one tablet twice daily for 5 days. 30 tablet Nyoka Lint, PA-C      PDMP not reviewed this encounter.   Nyoka Lint, PA-C 12/22/22 1302

## 2022-12-22 NOTE — ED Triage Notes (Signed)
Pt presents to uc with fever cough congestion for 3 days, pt reports at home pos Covid test.

## 2023-04-21 ENCOUNTER — Ambulatory Visit
Admission: RE | Admit: 2023-04-21 | Discharge: 2023-04-21 | Disposition: A | Discharge status: Home / Self Care | Payer: Medicare Other | Source: Ambulatory Visit | Attending: Emergency Medicine | Admitting: Emergency Medicine

## 2023-04-21 VITALS — BP 158/92 | HR 96 | Temp 98.3°F | Resp 17

## 2023-04-21 DIAGNOSIS — M545 Low back pain, unspecified: Secondary | ICD-10-CM | POA: Diagnosis not present

## 2023-04-21 MED ORDER — PREDNISONE 20 MG PO TABS: 40.0000 mg | ORAL_TABLET | Freq: Every day | ORAL | 0 refills | Status: DC

## 2023-04-21 MED ORDER — KETOROLAC TROMETHAMINE 30 MG/ML IJ SOLN
30.0000 mg | Freq: Once | INTRAMUSCULAR | Status: AC
  Administered 2023-04-21: 30 mg via INTRAMUSCULAR

## 2023-04-21 MED ORDER — BACLOFEN 5 MG PO TABS: 5.0000 mg | ORAL_TABLET | Freq: Every evening | ORAL | 0 refills | Status: DC | PRN

## 2023-04-21 NOTE — ED Provider Notes (Signed)
[facility]    CSN: [id number] Arrival date & time: 04/21/23  4098      History   Chief Complaint Chief Complaint  Patient presents with   Back Pain    Entered by patient    HPI Jean Shaffer is a 72 y.o. female.   Presents for evaluation of right-sided low back pain present for 1-1/2 weeks.  Endorses that she was the cleaning the fridge prior to symptoms beginning, stood up and heard a popping sound in her back immediately began to spasm.  Pain has been constant since, worsened with sitting, lying and changing of positions.  Symptoms only improved when lying onto back and legs are elevated at a 45 degree angle.  Intermittently wraps to the front of the abdomen.  Has attempted use of readjustment by chiropractor, ice, laser treatment which has been ineffective.  Denies numbness, tingling, urinary or bowel incontinence, abdominal or vaginal symptoms.     Past Medical History:  Diagnosis Date   Anemia    Arthritis    osteoarthritis in knees and hands ~2016 dx   Asthma    Chronic back pain    COPD (chronic obstructive pulmonary disease) (HCC)    per pt dx in ~2014   Heart murmur    as a child per pt   Hypertension    Hypothyroidism    Stroke (HCC)    per pt "maybe last year or the year before last, they told me I might've had a TIA, but I blacked out"   Thyroid disease     Patient Active Problem List   Diagnosis Date Noted   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   Persistent shortness of breath after COVID-19 11/02/2020   Insomnia 11/02/2020   Anxiety reaction 11/02/2020   Sprain of ulnar collateral ligament 11/02/2020   Protein-calorie malnutrition, severe 10/27/2020   Debility 10/25/2020   Acute respiratory failure with hypoxia (HCC)    COVID-19 09/30/2020   S/P laparoscopic appendectomy 01/29/2020   Perforated appendicitis 12/27/2019   Palpitations 02/05/2019   Syncope 12/07/2018   HTN (hypertension) 12/07/2018   COPD (chronic obstructive pulmonary disease)  (HCC) 12/07/2018    Past Surgical History:  Procedure Laterality Date   ABDOMINAL HYSTERECTOMY     CATARACT EXTRACTION W/ INTRAOCULAR LENS IMPLANT Left 07/14/2014   CATARACT EXTRACTION W/ INTRAOCULAR LENS IMPLANT Right 10/18/2015   COLONOSCOPY     EYE SURGERY     IR RADIOLOGIST EVAL & MGMT  01/08/2020   LAPAROSCOPIC APPENDECTOMY N/A 01/29/2020   Procedure: LAPAROSCOPIC  APPENDECTOMY;  Surgeon: Emelia Loron, MD;  Location: [facility];  Service: General;  Laterality: N/A;   MULTIPLE TOOTH EXTRACTIONS  2002   NECK SURGERY     SPINE SURGERY     WISDOM TOOTH EXTRACTION     per pt 01/21/20    OB History   No obstetric history on file.      Home Medications    Prior to Admission medications   Medication Sig Start Date End Date Taking? Authorizing Provider  acetaminophen (TYLENOL) 325 MG tablet Take 1-2 tablets (325-650 mg total) by mouth every 4 (four) hours as needed for mild pain. 10/26/20   Love, Evlyn Kanner, PA-C  albuterol (VENTOLIN HFA) 108 (90 Base) MCG/ACT inhaler Inhale into the lungs. 04/23/20   [provider]  amLODipine (NORVASC) 2.5 MG tablet Take 5 mg by mouth daily.    [provider]  ascorbic acid (VITAMIN C) 500 MG tablet Take 1 tablet (500 mg total)  by mouth daily. Patient not taking: Reported on 11/27/2022 10/26/20   Berton Mount I, MD  aspirin EC 81 MG tablet Take 81 mg by mouth daily. Patient not taking: Reported on 11/27/2022    [provider]  buPROPion (WELLBUTRIN XL) 150 MG 24 hr tablet Take 150 mg by mouth daily.  09/11/18   [provider]  calcium carbonate (TUMS - DOSED IN MG ELEMENTAL CALCIUM) 500 MG chewable tablet Chew 1 tablet (200 mg of elemental calcium total) by mouth 2 (two) times daily as needed for indigestion or heartburn. Patient not taking: Reported on 11/27/2022 10/25/20   Berton Mount I, MD  dextromethorphan-guaiFENesin [facility] DM) 30-600 MG 12hr tablet Take 1 tablet by mouth 2 (two) times daily. 12/22/22    Ellsworth Lennox, PA-C  hydrOXYzine (ATARAX/VISTARIL) 25 MG tablet Take 1 tablet (25 mg total) by mouth 3 (three) times daily as needed for anxiety. Patient not taking: Reported on 11/27/2022 11/02/20   Love, Evlyn Kanner, PA-C  levothyroxine (SYNTHROID, LEVOTHROID) 50 MCG tablet Take 50 mcg by mouth daily before breakfast.  09/06/18   [provider]  lip balm (CARMEX) ointment Apply topically as needed for lip care. Patient not taking: Reported on 11/27/2022 10/25/20   Berton Mount I, MD  melatonin 3 MG TABS tablet Take 1 tablet (3 mg total) by mouth at bedtime. Patient not taking: Reported on 11/27/2022 11/02/20   Love, Evlyn Kanner, PA-C  meloxicam (MOBIC) 15 MG tablet Take 15 mg by mouth daily. Patient not taking: Reported on 11/27/2022    [provider]  Multiple Vitamin (MULTIVITAMIN) LIQD Take 15 mLs by mouth daily. 10/26/20   Berton Mount I, MD  polyethylene glycol (MIRALAX / GLYCOLAX) 17 g packet Take 17 g by mouth daily. Patient taking differently: Take 17 g by mouth daily as needed for mild constipation. 12/30/19   Meuth, Brooke A, PA-C  senna-docusate (SENOKOT-S) 8.6-50 MG tablet Take 2 tablets by mouth daily with supper. 11/02/20   Love, Evlyn Kanner, PA-C  traMADol (ULTRAM) 50 MG tablet Take 1 tablet (50 mg total) by mouth every 6 (six) hours as needed for severe pain. Patient not taking: Reported on 11/27/2022 11/02/20   Love, Evlyn Kanner, PA-C  traZODone (DESYREL) 100 MG tablet Take 100 mg by mouth at bedtime as needed for sleep.  03/01/18   [provider]  umeclidinium bromide (INCRUSE ELLIPTA) 62.5 MCG/INH AEPB Inhale 1 puff into the lungs daily. 10/26/20   Barnetta Chapel, MD  valACYclovir (VALTREX) 500 MG tablet Take 500 mg by mouth See admin instructions. Take 1 tablet by mouth twice daily for 3 days during outbreak 06/10/18   [provider]  zinc sulfate 220 (50 Zn) MG capsule Take 1 capsule (220 mg total) by mouth daily. Patient not taking: Reported on  11/27/2022 10/26/20   Barnetta Chapel, MD    Family History Family History  Problem Relation Age of Onset   Alcohol abuse Father    Cancer Father    Heart disease Father    Cancer Mother    Cancer Maternal Grandfather    Heart disease Maternal Grandfather    Heart disease Paternal Grandmother    Diabetes Paternal Grandmother    Diabetes Paternal Grandfather     Social History Social History   Tobacco Use   Smoking status: Former   Smokeless tobacco: Never  Building services engineer Use: Never used  Substance Use Topics   Alcohol use: No   Drug use: No  Allergies   Patient has no known allergies.   Review of Systems Review of Systems  Constitutional: Negative.   HENT: Negative.    Respiratory: Negative.    Cardiovascular: Negative.   Musculoskeletal:  Positive for back pain. Negative for arthralgias, gait problem, joint swelling, myalgias, neck pain and neck stiffness.  Skin: Negative.   Neurological: Negative.      Physical Exam Triage Vital Signs ED Triage Vitals  Enc Vitals Group     BP 04/21/23 1008 (!) 158/92     Pulse Rate 04/21/23 1008 96     Resp 04/21/23 1008 17     Temp 04/21/23 1008 98.3 F (36.8 C)     Temp src --      SpO2 04/21/23 1008 92 %     Weight --      Height --      Head Circumference --      Peak Flow --      Pain Score 04/21/23 1009 10     Pain Loc --      Pain Edu? --      Excl. in [place]? --    No data found.  Updated Vital Signs BP (!) 158/92   Pulse 96   Temp 98.3 F (36.8 C)   Resp 17   SpO2 92%   Visual Acuity Right Eye Distance:   Left Eye Distance:   Bilateral Distance:    Right Eye Near:   Left Eye Near:    Bilateral Near:     Physical Exam Constitutional:      Appearance: Normal appearance.  Eyes:     Extraocular Movements: Extraocular movements intact.  Pulmonary:     Effort: Pulmonary effort is normal.  Musculoskeletal:     Comments: Tenderness is present over the midline of the lumbar region  into the right lower latissimus dorsi without ecchymosis, swelling or deformity, positive straight leg test on the right side, able to twist, turn and bend but pain is elicited, able to sit right without complication  Neurological:     Mental Status: She is alert and oriented to person, place, and time. Mental status is at baseline.      UC Treatments / Results  Labs (all labs ordered are listed, but only abnormal results are displayed) Labs Reviewed - No data to display  EKG   Radiology No results found.  Procedures Procedures (including critical care time)  Medications Ordered in UC Medications - No data to display  Initial Impression / Assessment and Plan / UC Course  I have reviewed the triage vital signs and the nursing notes.  Pertinent labs & imaging results that were available during my care of the patient were reviewed by me and considered in my medical decision making (see chart for details).  Acute right-sided low back pain without sciatica, acute midline low back pain without sciatica  Etiology is most likely muscular, discussed with patient, Toradol injection given in office and prescribed prednisone and baclofen for outpatient use, recommended RICE, heat massage stretching and activity as tolerated, may follow-up with her orthopedic specialist if symptoms persist or worsen Final Clinical Impressions(s) / UC Diagnoses   Final diagnoses:  None   Discharge Instructions   None    ED Prescriptions   None    PDMP not reviewed this encounter.   Valinda Hoar, NP 04/21/23 1037

## 2023-04-21 NOTE — ED Triage Notes (Signed)
Pt presents with back pain that she stated started a week and half ago. Pt stated that she is going through spasms.

## 2023-04-21 NOTE — Discharge Instructions (Signed)
Your pain is most likely caused by irritation to the muscles or ligaments.   Given injection of Toradol today here in the office to help reduce inflammation and help with your pain, daily you will start to see relief in about 30 minutes  Starting tomorrow take prednisone every morning with food for 5 days, you may take Tylenol in addition  You may use baclofen at bedtime as needed for additional comfort, be mindful of this will make you feel drowsy  You may use heating pad in 15 minute intervals as needed for additional comfort, within the first 2-3 days you may find comfort in using ice in 10-15 minutes over affected area  Begin stretching affected area daily for 10 minutes as tolerated to further loosen muscles   When lying down place pillow underneath and between knees for support  Can try sleeping without pillow on firm mattress   Practice good posture: head back, shoulders back, chest forward, pelvis back and weight distributed evenly on both legs  If pain persist after recommended treatment or reoccurs if may be beneficial to follow up with orthopedic specialist for evaluation, this doctor specializes in the bones and can manage your symptoms long-term with options such as but not limited to imaging, medications or physical therapy

## 2023-05-02 DIAGNOSIS — I7090 Unspecified atherosclerosis: Secondary | ICD-10-CM | POA: Insufficient documentation

## 2023-07-17 ENCOUNTER — Telehealth: Payer: Medicare Other | Admitting: Nurse Practitioner

## 2023-07-17 DIAGNOSIS — R42 Dizziness and giddiness: Secondary | ICD-10-CM

## 2023-07-17 MED ORDER — FLUTICASONE PROPIONATE 50 MCG/ACT NA SUSP: 2.0000 | Freq: Every day | NASAL | 6 refills | Status: DC

## 2023-07-17 MED ORDER — ONDANSETRON 4 MG PO TBDP: 4.0000 mg | ORAL_TABLET | Freq: Three times a day (TID) | ORAL | 0 refills | Status: DC | PRN

## 2023-07-17 MED ORDER — MECLIZINE HCL 12.5 MG PO TABS: 12.5000 mg | ORAL_TABLET | Freq: Three times a day (TID) | ORAL | 0 refills | Status: DC | PRN

## 2023-07-17 NOTE — Progress Notes (Signed)
Virtual Visit Consent   VAEDA WESTALL, you are scheduled for a virtual visit with a [facility] provider today. Just as with appointments in the office, your consent must be obtained to participate. Your consent will be active for this visit and any virtual visit you may have with one of our providers in the next 365 days. If you have a MyChart account, a copy of this consent can be sent to you electronically.  As this is a virtual visit, video technology does not allow for your provider to perform a traditional examination. This may limit your provider's ability to fully assess your condition. If your provider identifies any concerns that need to be evaluated in person or the need to arrange testing (such as labs, EKG, etc.), we will make arrangements to do so. Although advances in technology are sophisticated, we cannot ensure that it will always work on either your end or our end. If the connection with a video visit is poor, the visit may have to be switched to a telephone visit. With either a video or telephone visit, we are not always able to ensure that we have a secure connection.  By engaging in this virtual visit, you consent to the provision of healthcare and authorize for your insurance to be billed (if applicable) for the services provided during this visit. Depending on your insurance coverage, you may receive a charge related to this service.  I need to obtain your verbal consent now. Are you willing to proceed with your visit today? Jean Shaffer has provided verbal consent on 07/17/2023 for a virtual visit (video or telephone). Viviano Simas, FNP  Date: 07/17/2023 12:14 PM  Virtual Visit via Video Note   I, Viviano Simas, connected with  Jean Shaffer  ([id number], [birth year]) on 07/17/23 at 12:15 PM EDT by a video-enabled telemedicine application and verified that I am speaking with the correct person using two identifiers.  Location: Patient: Virtual Visit Location Patient:  Home Provider: Virtual Visit Location Provider: Home Office   I discussed the limitations of evaluation and management by telemedicine and the availability of in person appointments. The patient expressed understanding and agreed to proceed.    History of Present Illness: Jean Shaffer is a 72 y.o. who identifies as a female who was assigned female at birth, and is being seen today for vertigo.  Onset of symptoms was yesterday morning  She woke up to go to work yesterday and had onset of nausea, head "spinning"   Spent most of the day in bed yesterday   Today she is able to sit up but when she moves she gets very dizzy and needs to sit down   She was at the beach last week and returned Sunday (day prior to symptom onset)  Denies any URI symptoms   She has felt pressure in her ears yesterday and today  She has not vomited  She did swim at the beach but did not go underwater  Does not feel that she had water in her ears   She does monitor her blood pressure at home  Sitting her BP is: 153/76 Pulse 84   Denies any chest pain or heart palpitations     Problems:  Patient Active Problem List   Diagnosis Date Noted   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   Persistent shortness of breath after COVID-19 11/02/2020   Insomnia 11/02/2020   Anxiety reaction 11/02/2020   Sprain of ulnar collateral ligament 11/02/2020   Protein-calorie malnutrition,  severe 10/27/2020   Debility 10/25/2020   Acute respiratory failure with hypoxia (HCC)    COVID-19 09/30/2020   S/P laparoscopic appendectomy 01/29/2020   Perforated appendicitis 12/27/2019   Palpitations 02/05/2019   Syncope 12/07/2018   HTN (hypertension) 12/07/2018   COPD (chronic obstructive pulmonary disease) (HCC) 12/07/2018    Allergies: No Known Allergies Medications:  Current Outpatient Medications:    acetaminophen (TYLENOL) 325 MG tablet, Take 1-2 tablets (325-650 mg total) by mouth every 4 (four) hours as needed for mild pain.,  Disp: , Rfl:    albuterol (VENTOLIN HFA) 108 (90 Base) MCG/ACT inhaler, Inhale into the lungs., Disp: , Rfl:    amLODipine (NORVASC) 2.5 MG tablet, Take 5 mg by mouth daily., Disp: , Rfl:    ascorbic acid (VITAMIN C) 500 MG tablet, Take 1 tablet (500 mg total) by mouth daily. (Patient not taking: Reported on 11/27/2022), Disp: 1 tablet, Rfl: 0   aspirin EC 81 MG tablet, Take 81 mg by mouth daily. (Patient not taking: Reported on 11/27/2022), Disp: , Rfl:    Baclofen 5 MG TABS, Take 1 tablet (5 mg total) by mouth at bedtime as needed., Disp: 20 tablet, Rfl: 0   buPROPion (WELLBUTRIN XL) 150 MG 24 hr tablet, Take 150 mg by mouth daily. , Disp: , Rfl:    calcium carbonate (TUMS - DOSED IN MG ELEMENTAL CALCIUM) 500 MG chewable tablet, Chew 1 tablet (200 mg of elemental calcium total) by mouth 2 (two) times daily as needed for indigestion or heartburn. (Patient not taking: Reported on 11/27/2022), Disp: 1 tablet, Rfl: 0   dextromethorphan-guaiFENesin (MUCINEX DM) 30-600 MG 12hr tablet, Take 1 tablet by mouth 2 (two) times daily., Disp: 20 tablet, Rfl: 0   hydrOXYzine (ATARAX/VISTARIL) 25 MG tablet, Take 1 tablet (25 mg total) by mouth 3 (three) times daily as needed for anxiety. (Patient not taking: Reported on 11/27/2022), Disp: 30 tablet, Rfl: 0   levothyroxine (SYNTHROID, LEVOTHROID) 50 MCG tablet, Take 50 mcg by mouth daily before breakfast. , Disp: , Rfl:    lip balm (CARMEX) ointment, Apply topically as needed for lip care. (Patient not taking: Reported on 11/27/2022), Disp: 7 g, Rfl: 0   melatonin 3 MG TABS tablet, Take 1 tablet (3 mg total) by mouth at bedtime. (Patient not taking: Reported on 11/27/2022), Disp: 30 tablet, Rfl: 0   meloxicam (MOBIC) 15 MG tablet, Take 15 mg by mouth daily. (Patient not taking: Reported on 11/27/2022), Disp: , Rfl:    Multiple Vitamin (MULTIVITAMIN) LIQD, Take 15 mLs by mouth daily., Disp: 30 mL, Rfl: 0   polyethylene glycol (MIRALAX / GLYCOLAX) 17 g packet, Take 17 g by  mouth daily. (Patient taking differently: Take 17 g by mouth daily as needed for mild constipation.), Disp: 14 each, Rfl: 0   predniSONE (DELTASONE) 20 MG tablet, Take 2 tablets (40 mg total) by mouth daily., Disp: 10 tablet, Rfl: 0   senna-docusate (SENOKOT-S) 8.6-50 MG tablet, Take 2 tablets by mouth daily with supper., Disp: 60 tablet, Rfl: 0   traMADol (ULTRAM) 50 MG tablet, Take 1 tablet (50 mg total) by mouth every 6 (six) hours as needed for severe pain. (Patient not taking: Reported on 11/27/2022), Disp: 20 tablet, Rfl: 0   traZODone (DESYREL) 100 MG tablet, Take 100 mg by mouth at bedtime as needed for sleep. , Disp: , Rfl:    umeclidinium bromide (INCRUSE ELLIPTA) 62.5 MCG/INH AEPB, Inhale 1 puff into the lungs daily., Disp: 1 each, Rfl: 0  valACYclovir (VALTREX) 500 MG tablet, Take 500 mg by mouth See admin instructions. Take 1 tablet by mouth twice daily for 3 days during outbreak, Disp: , Rfl:    zinc sulfate 220 (50 Zn) MG capsule, Take 1 capsule (220 mg total) by mouth daily. (Patient not taking: Reported on 11/27/2022), Disp: 1 capsule, Rfl: 0  Observations/Objective: Patient is well-developed, well-nourished in no acute distress.  Resting comfortably  at home.  Head is normocephalic, atraumatic.  No labored breathing.  Speech is clear and coherent with logical content.  Patient is alert and oriented at baseline.    Assessment and Plan: 1. Vertigo  - ondansetron (ZOFRAN-ODT) 4 MG disintegrating tablet; Take 1 tablet (4 mg total) by mouth every 8 (eight) hours as needed.  Dispense: 20 tablet; Refill: 0 - fluticasone (FLONASE) 50 MCG/ACT nasal spray; Place 2 sprays into both nostrils daily.  Dispense: 16 g; Refill: 6 - meclizine (ANTIVERT) 12.5 MG tablet; Take 1 tablet (12.5 mg total) by mouth 3 (three) times daily as needed for dizziness.  Dispense: 30 tablet; Refill: 0  Change positions slowly  Advised against driving  Follow up with PCP by end of week if not improving       Follow Up Instructions: I discussed the assessment and treatment plan with the patient. The patient was provided an opportunity to ask questions and all were answered. The patient agreed with the plan and demonstrated an understanding of the instructions.  A copy of instructions were sent to the patient via MyChart unless otherwise noted below.    The patient was advised to call back or seek an in-person evaluation if the symptoms worsen or if the condition fails to improve as anticipated.  Time:  I spent 15 minutes with the patient via telehealth technology discussing the above problems/concerns.    Viviano Simas, FNP

## 2023-07-20 ENCOUNTER — Ambulatory Visit: Payer: Medicaid Other | Admitting: Podiatry

## 2023-08-01 ENCOUNTER — Other Ambulatory Visit: Payer: Self-pay | Admitting: Podiatry

## 2023-08-01 ENCOUNTER — Ambulatory Visit (INDEPENDENT_AMBULATORY_CARE_PROVIDER_SITE_OTHER): Payer: Medicare Other | Admitting: Podiatry

## 2023-08-01 ENCOUNTER — Encounter: Payer: Self-pay | Admitting: Podiatry

## 2023-08-01 DIAGNOSIS — B351 Tinea unguium: Secondary | ICD-10-CM

## 2023-08-01 DIAGNOSIS — M546 Pain in thoracic spine: Secondary | ICD-10-CM | POA: Insufficient documentation

## 2023-08-01 DIAGNOSIS — Z79899 Other long term (current) drug therapy: Secondary | ICD-10-CM | POA: Diagnosis not present

## 2023-08-01 DIAGNOSIS — K5900 Constipation, unspecified: Secondary | ICD-10-CM | POA: Insufficient documentation

## 2023-08-01 DIAGNOSIS — Z86010 Personal history of colonic polyps: Secondary | ICD-10-CM | POA: Insufficient documentation

## 2023-08-01 DIAGNOSIS — M25519 Pain in unspecified shoulder: Secondary | ICD-10-CM | POA: Insufficient documentation

## 2023-08-01 DIAGNOSIS — M542 Cervicalgia: Secondary | ICD-10-CM | POA: Insufficient documentation

## 2023-08-01 NOTE — Progress Notes (Signed)
Subjective:  Patient ID: Jean Shaffer, female    DOB: Aug 15, 1951,  MRN: [id number]  Chief Complaint  Patient presents with   Nail Problem    Nail fungus bilateral hallux     72 y.o. female presents with the above complaint.  Patient presents with bilateral hallux thickened dystrophic mycotic toenails x 2.  She states been present for quite some hydrating downwards.  She tried over-the-counter option unknown which has helped.  She would like to discuss more aggressive options.   Review of Systems: Negative except as noted in the HPI. Denies N/V/F/Ch.  Past Medical History:  Diagnosis Date   Anemia    Arthritis    osteoarthritis in knees and hands ~2016 dx   Asthma    Chronic back pain    COPD (chronic obstructive pulmonary disease) (HCC)    per pt dx in ~2014   Heart murmur    as a child per pt   Hypertension    Hypothyroidism    Stroke (HCC)    per pt "maybe last year or the year before last, they told me I might've had a TIA, but I blacked out"   Thyroid disease     Current Outpatient Medications:    acetaminophen (TYLENOL) 325 MG tablet, Take 1-2 tablets (325-650 mg total) by mouth every 4 (four) hours as needed for mild pain., Disp: , Rfl:    albuterol (VENTOLIN HFA) 108 (90 Base) MCG/ACT inhaler, Inhale into the lungs., Disp: , Rfl:    amLODipine (NORVASC) 2.5 MG tablet, Take 5 mg by mouth daily., Disp: , Rfl:    B Complex Vitamins (VITAMIN B COMPLEX) TABS, as directed Orally, Disp: , Rfl:    buPROPion (WELLBUTRIN XL) 150 MG 24 hr tablet, Take 150 mg by mouth daily. , Disp: , Rfl:    buPROPion (ZYBAN) 150 MG 12 hr tablet, 1 tablet in the morning Orally Once a day for 30 day(s), Disp: , Rfl:    Calcium Carb-Cholecalciferol 600-5 MG-MCG TABS, 1 tablet with food Orally Once a day for 30 day(s), Disp: , Rfl:    calcium carbonate (TUMS - DOSED IN MG ELEMENTAL CALCIUM) 500 MG chewable tablet, Chew 1 tablet (200 mg of elemental calcium total) by mouth 2 (two) times daily as  needed for indigestion or heartburn. (Patient not taking: Reported on 11/27/2022), Disp: 1 tablet, Rfl: 0   dextromethorphan-guaiFENesin (MUCINEX DM) 30-600 MG 12hr tablet, Take 1 tablet by mouth 2 (two) times daily., Disp: 20 tablet, Rfl: 0   fluticasone (FLONASE) 50 MCG/ACT nasal spray, Place 2 sprays into both nostrils daily., Disp: 16 g, Rfl: 6   gabapentin (NEURONTIN) 600 MG tablet, 1 tablet Orally Three times a day for 30 day(s), Disp: , Rfl:    hydrOXYzine (ATARAX/VISTARIL) 25 MG tablet, Take 1 tablet (25 mg total) by mouth 3 (three) times daily as needed for anxiety. (Patient not taking: Reported on 11/27/2022), Disp: 30 tablet, Rfl: 0   levothyroxine (SYNTHROID, LEVOTHROID) 50 MCG tablet, Take 50 mcg by mouth daily before breakfast. , Disp: , Rfl:    lip balm (CARMEX) ointment, Apply topically as needed for lip care. (Patient not taking: Reported on 11/27/2022), Disp: 7 g, Rfl: 0   meclizine (ANTIVERT) 12.5 MG tablet, Take 1 tablet (12.5 mg total) by mouth 3 (three) times daily as needed for dizziness., Disp: 30 tablet, Rfl: 0   melatonin 3 MG TABS tablet, Take 1 tablet (3 mg total) by mouth at bedtime. (Patient not taking: Reported on 11/27/2022),  Disp: 30 tablet, Rfl: 0   meloxicam (MOBIC) 15 MG tablet, Take 15 mg by mouth daily. (Patient not taking: Reported on 11/27/2022), Disp: , Rfl:    morphine (MSIR) 15 MG tablet, 1 tablet as needed Orally Three times a day, Disp: , Rfl:    Multiple Vitamin (MULTIVITAMIN) LIQD, Take 15 mLs by mouth daily., Disp: 30 mL, Rfl: 0   omeprazole (PRILOSEC) 20 MG capsule, 1 capsule Orally Once a day for 30 day(s), Disp: , Rfl:    ondansetron (ZOFRAN-ODT) 4 MG disintegrating tablet, Take 1 tablet (4 mg total) by mouth every 8 (eight) hours as needed., Disp: 20 tablet, Rfl: 0   oxyCODONE-acetaminophen (PERCOCET) 7.5-325 MG tablet, 0.5 tablet as needed Orally once a day, Disp: , Rfl:    polyethylene glycol (MIRALAX / GLYCOLAX) 17 g packet, Take 17 g by mouth daily.  (Patient taking differently: Take 17 g by mouth daily as needed for mild constipation.), Disp: 14 each, Rfl: 0   predniSONE (DELTASONE) 20 MG tablet, Take 2 tablets (40 mg total) by mouth daily., Disp: 10 tablet, Rfl: 0   senna-docusate (SENOKOT-S) 8.6-50 MG tablet, Take 2 tablets by mouth daily with supper., Disp: 60 tablet, Rfl: 0   traMADol (ULTRAM) 50 MG tablet, Take 1 tablet (50 mg total) by mouth every 6 (six) hours as needed for severe pain. (Patient not taking: Reported on 11/27/2022), Disp: 20 tablet, Rfl: 0   traZODone (DESYREL) 100 MG tablet, Take 100 mg by mouth at bedtime as needed for sleep. , Disp: , Rfl:    umeclidinium bromide (INCRUSE ELLIPTA) 62.5 MCG/INH AEPB, Inhale 1 puff into the lungs daily., Disp: 1 each, Rfl: 0   valACYclovir (VALTREX) 500 MG tablet, Take 500 mg by mouth See admin instructions. Take 1 tablet by mouth twice daily for 3 days during outbreak, Disp: , Rfl:    zinc sulfate 220 (50 Zn) MG capsule, Take 1 capsule (220 mg total) by mouth daily. (Patient not taking: Reported on 11/27/2022), Disp: 1 capsule, Rfl: 0  Social History   Tobacco Use  Smoking Status Former  Smokeless Tobacco Never    No Known Allergies Objective:  There were no vitals filed for this visit. There is no height or weight on file to calculate BMI. Constitutional Well developed. Well nourished.  Vascular Dorsalis pedis pulses palpable bilaterally. Posterior tibial pulses palpable bilaterally. Capillary refill normal to all digits.  No cyanosis or clubbing noted. Pedal hair growth normal.  Neurologic Normal speech. Oriented to person, place, and time. Epicritic sensation to light touch grossly present bilaterally.  Dermatologic Nails thickened and dystrophic mycotic toenails x 2 mild pain on palpation Skin within normal limits  Orthopedic: Normal joint ROM without pain or crepitus bilaterally. No visible deformities. No bony tenderness.   Radiographs: None Assessment:   1.  Long-term use of high-risk medication   2. Nail fungus   3. Onychomycosis due to dermatophyte    Plan:  Patient was evaluated and treated and all questions answered.  Bilateral hallux onychomycosis -Educated the patient on the etiology of onychomycosis and various treatment options associated with improving the fungal load.  I explained to the patient that there is 3 treatment options available to treat the onychomycosis including topical, p.o., laser treatment.  Patient elected to undergo p.o. options with Lamisil/terbinafine therapy.  In order for me to start the medication therapy, I explained to the patient the importance of evaluating the liver and obtaining the liver function test.  Once the liver function  test comes back normal I will start him on 77-month course of Lamisil therapy.  Patient understood all risk and would like to proceed with Lamisil therapy.  I have asked the patient to immediately stop the Lamisil therapy if she has any reactions to it and call the office or go to the emergency room right away.  Patient states understanding   No follow-ups on file.

## 2023-08-06 MED ORDER — TERBINAFINE HCL 250 MG PO TABS: 250.0000 mg | ORAL_TABLET | Freq: Every day | ORAL | 0 refills | Status: DC

## 2023-08-06 NOTE — Addendum Note (Signed)
Addended by: Nicholes Rough on: 08/06/2023 07:51 AM   Modules accepted: Orders

## 2023-08-29 ENCOUNTER — Ambulatory Visit: Payer: Medicare Other | Admitting: Pulmonary Disease

## 2023-09-05 ENCOUNTER — Ambulatory Visit (INDEPENDENT_AMBULATORY_CARE_PROVIDER_SITE_OTHER): Payer: Medicare Other | Admitting: Podiatry

## 2023-09-05 DIAGNOSIS — B351 Tinea unguium: Secondary | ICD-10-CM

## 2023-09-05 DIAGNOSIS — Z79899 Other long term (current) drug therapy: Secondary | ICD-10-CM

## 2023-09-05 NOTE — Progress Notes (Signed)
Patient was told to make an appointment for laser tech.

## 2023-09-07 ENCOUNTER — Ambulatory Visit (INDEPENDENT_AMBULATORY_CARE_PROVIDER_SITE_OTHER): Payer: Medicare Other | Admitting: *Deleted

## 2023-09-07 DIAGNOSIS — B351 Tinea unguium: Secondary | ICD-10-CM

## 2023-09-07 NOTE — Patient Instructions (Signed)

## 2023-09-07 NOTE — Progress Notes (Addendum)
Patient presents today for the 1st laser treatment. Diagnosed with mycotic nail infection by Dr. Allena Katz.   Toenail most affected hallux left.  All other systems are negative.  Nails were filed thin. Laser therapy was administered to 1-5 toenails bilateral and patient tolerated the treatment well. All safety precautions were in place.   Patient is also taking oral terbinafine x 90 days. She has completed 30 days so far.  No trimming or filing done on unaffected nails. Single laser pass was done.   Follow up in 4 weeks for laser # 4.

## 2023-10-26 ENCOUNTER — Ambulatory Visit: Payer: Medicare Other | Admitting: *Deleted

## 2023-10-26 DIAGNOSIS — B351 Tinea unguium: Secondary | ICD-10-CM

## 2023-10-26 NOTE — Progress Notes (Signed)
Patient presents today for the 2nd laser treatment. Diagnosed with mycotic nail infection by Dr. Allena Katz.   Toenail most affected hallux left. Patient is happy with progress and states "it is improving". Small section at tip of left hallux nail still affected.   All other systems are negative.  Nails were filed thin. Laser therapy was administered to 1-5 toenails bilateral and patient tolerated the treatment well. All safety precautions were in place.   Patient is also taking oral terbinafine x 90 days. She has completed 60 days so far.  No trimming or filing done on unaffected nails. Single laser pass was done.   Follow up in 4 weeks for laser # 5.

## 2023-11-01 ENCOUNTER — Encounter (HOSPITAL_COMMUNITY): Payer: Self-pay

## 2023-11-01 ENCOUNTER — Ambulatory Visit
Admission: EM | Admit: 2023-11-01 | Discharge: 2023-11-01 | Disposition: A | Discharge status: Home / Self Care | Payer: Medicare Other

## 2023-11-01 ENCOUNTER — Other Ambulatory Visit: Payer: Self-pay

## 2023-11-01 ENCOUNTER — Emergency Department (HOSPITAL_COMMUNITY): Payer: Medicare Other

## 2023-11-01 ENCOUNTER — Emergency Department (HOSPITAL_COMMUNITY)
Admission: EM | Admit: 2023-11-01 | Discharge: 2023-11-02 | Disposition: A | Discharge status: Home / Self Care | Payer: Medicare Other | Attending: Emergency Medicine | Admitting: Emergency Medicine

## 2023-11-01 DIAGNOSIS — R1011 Right upper quadrant pain: Secondary | ICD-10-CM

## 2023-11-01 DIAGNOSIS — Z79899 Other long term (current) drug therapy: Secondary | ICD-10-CM | POA: Diagnosis not present

## 2023-11-01 DIAGNOSIS — K8050 Calculus of bile duct without cholangitis or cholecystitis without obstruction: Secondary | ICD-10-CM

## 2023-11-01 DIAGNOSIS — K8070 Calculus of gallbladder and bile duct without cholecystitis without obstruction: Secondary | ICD-10-CM | POA: Insufficient documentation

## 2023-11-01 LAB — COMPREHENSIVE METABOLIC PANEL
ALT: 19 U/L (ref 0–44)
AST: 18 U/L (ref 15–41)
Albumin: 4.1 g/dL (ref 3.5–5.0)
Alkaline Phosphatase: 84 U/L (ref 38–126)
Anion gap: 8 (ref 5–15)
BUN: 14 mg/dL (ref 8–23)
CO2: 23 mmol/L (ref 22–32)
Calcium: 9.4 mg/dL (ref 8.9–10.3)
Chloride: 106 mmol/L (ref 98–111)
Creatinine, Ser: 0.73 mg/dL (ref 0.44–1.00)
GFR, Estimated: >60 mL/min (ref >60)
Glucose, Bld: 94 mg/dL (ref 70–99)
Potassium: 3.6 mmol/L (ref 3.5–5.1)
Sodium: 137 mmol/L (ref 135–145)
Total Bilirubin: 0.6 mg/dL (ref <1.2)
Total Protein: 7.8 g/dL (ref 6.5–8.1)

## 2023-11-01 LAB — URINALYSIS, ROUTINE W REFLEX MICROSCOPIC
Bacteria, UA: NONE SEEN
Bilirubin Urine: NEGATIVE
Glucose, UA: NEGATIVE mg/dL
Hgb urine dipstick: NEGATIVE
Ketones, ur: NEGATIVE mg/dL
Nitrite: NEGATIVE
Protein, ur: NEGATIVE mg/dL
Specific Gravity, Urine: 1.013 (ref 1.005–1.030)
pH: 5 (ref 5.0–8.0)

## 2023-11-01 LAB — CBC
HCT: 40.7 % (ref 36.0–46.0)
Hemoglobin: 13.3 g/dL (ref 12.0–15.0)
MCH: 29.6 pg (ref 26.0–34.0)
MCHC: 32.7 g/dL (ref 30.0–36.0)
MCV: 90.4 fL (ref 80.0–100.0)
Platelets: 322 10*3/uL (ref 150–400)
RBC: 4.5 MIL/uL (ref 3.87–5.11)
RDW: 13.5 % (ref 11.5–15.5)
WBC: 10.8 10*3/uL — ABNORMAL HIGH (ref 4.0–10.5)
nRBC: 0 % (ref 0.0–0.2)

## 2023-11-01 LAB — LIPASE, BLOOD: Lipase: 28 U/L (ref 11–51)

## 2023-11-01 LAB — TROPONIN I (HIGH SENSITIVITY): Troponin I (High Sensitivity): 5 ng/L (ref <18)

## 2023-11-01 MED ORDER — SODIUM CHLORIDE 0.9 % IV BOLUS
1000.0000 mL | Freq: Once | INTRAVENOUS | Status: AC
  Administered 2023-11-01: 1000 mL via INTRAVENOUS

## 2023-11-01 MED ORDER — IOHEXOL 300 MG/ML  SOLN
100.0000 mL | Freq: Once | INTRAMUSCULAR | Status: AC | PRN
  Administered 2023-11-01: 100 mL via INTRAVENOUS

## 2023-11-01 MED ORDER — OXYCODONE-ACETAMINOPHEN 5-325 MG PO TABS
2.0000 | ORAL_TABLET | Freq: Once | ORAL | Status: AC
  Administered 2023-11-02: 2 via ORAL
  Filled 2023-11-01: qty 2

## 2023-11-01 MED ORDER — OXYCODONE-ACETAMINOPHEN 5-325 MG PO TABS: 1.0000 | ORAL_TABLET | Freq: Four times a day (QID) | ORAL | 0 refills | Status: DC | PRN

## 2023-11-01 MED ORDER — MORPHINE SULFATE (PF) 4 MG/ML IV SOLN
4.0000 mg | Freq: Once | INTRAVENOUS | Status: AC
  Administered 2023-11-01: 4 mg via INTRAVENOUS

## 2023-11-01 MED ORDER — ONDANSETRON HCL 4 MG/2ML IJ SOLN
4.0000 mg | Freq: Once | INTRAMUSCULAR | Status: AC
  Administered 2023-11-01: 4 mg via INTRAVENOUS
  Filled 2023-11-01: qty 2

## 2023-11-01 MED ORDER — ONDANSETRON 4 MG PO TBDP: ORAL_TABLET | ORAL | 0 refills | Status: DC

## 2023-11-01 MED ORDER — MORPHINE SULFATE (PF) 4 MG/ML IV SOLN
4.0000 mg | Freq: Once | INTRAVENOUS | Status: AC
  Administered 2023-11-01: 4 mg via INTRAVENOUS
  Filled 2023-11-01: qty 1

## 2023-11-01 NOTE — Discharge Instructions (Addendum)
As we discussed, your CT and ultrasound showed gallstones.   I have ordered some Percocet as needed for abdominal pain.  I have also prescribed Zofran for nausea.  Please stay hydrated.  Please call surgery office tomorrow for appointment to schedule for cholecystectomy  Return to ER if you have severe abdominal pain or vomiting or fever

## 2023-11-01 NOTE — ED Provider Notes (Signed)
[facility]    CSN: [id number] Arrival date & time: 11/01/23  1104      History   Chief Complaint No chief complaint on file.   HPI Jean Shaffer is a 72 y.o. female.   Patient presents with right upper abdominal pain that radiates around to her back that started about 5 days ago.  Patient reports pain is worsening and she currently rates it 7/10 on pain scale.  Has been taking ibuprofen and Tylenol with minimal improvement.  Denies fever but does report chills.  Reports nausea but no vomiting or diarrhea.  Patient having normal bowel movements.  Reports that she has history of gallstones.  Appendix has been removed.     Past Medical History:  Diagnosis Date   Anemia    Arthritis    osteoarthritis in knees and hands ~2016 dx   Asthma    Chronic back pain    COPD (chronic obstructive pulmonary disease) (HCC)    per pt dx in ~2014   Heart murmur    as a child per pt   Hypertension    Hypothyroidism    Stroke [facility])    per pt "maybe last year or the year before last, they told me I might've had a TIA, but I blacked out"   Thyroid disease     Patient Active Problem List   Diagnosis Date Noted   Acute thoracic back pain 08/01/2023   Constipation 08/01/2023   History of colon polyps 08/01/2023   Neck pain 08/01/2023   Shoulder joint pain 08/01/2023   Atherosclerosis 05/02/2023   Chronic kidney disease, stage 3b (HCC) 05/01/2022   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   Persistent shortness of breath after COVID-19 11/02/2020   Insomnia 11/02/2020   Anxiety reaction 11/02/2020   Sprain of ulnar collateral ligament 11/02/2020   Protein-calorie malnutrition, severe 10/27/2020   Debility 10/25/2020   Acute respiratory failure with hypoxia (HCC)    COVID-19 09/30/2020   Elevated WBC count 06/07/2020   S/P laparoscopic appendectomy 01/29/2020   Perforated appendicitis 12/27/2019   Palpitations 02/05/2019   Dizziness 12/16/2018   Syncope 12/07/2018   HTN  (hypertension) 12/07/2018   COPD (chronic obstructive pulmonary disease) (HCC) 12/07/2018   Lumbar radiculopathy 09/20/2017   At high risk for falls 05/24/2017   Decreased hearing 11/22/2016   Cataract 07/30/2015   Psoriasis 07/30/2015   Herpes simplex vulvovaginitis 01/25/2015   Chronic back pain 03/03/2014   Displacement of lumbar intervertebral disc without myelopathy 10/22/2013   Lumbar post-laminectomy syndrome 10/22/2013   Continuous opioid dependence (HCC) 08/28/2013   Hyperlipidemia 04/19/2012   GERD (gastroesophageal reflux disease) 04/16/2012   Hereditary and idiopathic peripheral neuropathy 11/09/2010   Moderate episode of recurrent major depressive disorder (HCC) 11/09/2010   Osteoarthritis 11/08/2010    Past Surgical History:  Procedure Laterality Date   ABDOMINAL HYSTERECTOMY     CATARACT EXTRACTION W/ INTRAOCULAR LENS IMPLANT Left 07/14/2014   CATARACT EXTRACTION W/ INTRAOCULAR LENS IMPLANT Right 10/18/2015   COLONOSCOPY     EYE SURGERY     IR RADIOLOGIST EVAL & MGMT  01/08/2020   LAPAROSCOPIC APPENDECTOMY N/A 01/29/2020   Procedure: LAPAROSCOPIC  APPENDECTOMY;  Surgeon: Emelia Loron, MD;  Location: [facility] OR;  Service: General;  Laterality: N/A;   MULTIPLE TOOTH EXTRACTIONS  2002   NECK SURGERY     SPINE SURGERY     WISDOM TOOTH EXTRACTION     per pt 01/21/20    OB History   No obstetric history on  file.      Home Medications    Prior to Admission medications   Medication Sig Start Date End Date Taking? Authorizing Provider  acetaminophen (TYLENOL) 325 MG tablet Take 1-2 tablets (325-650 mg total) by mouth every 4 (four) hours as needed for mild pain. 10/26/20   Love, Evlyn Kanner, PA-C  albuterol (VENTOLIN HFA) 108 (90 Base) MCG/ACT inhaler Inhale into the lungs. 04/23/20   [provider]  amLODipine (NORVASC) 2.5 MG tablet Take 5 mg by mouth daily.    [provider]  B Complex Vitamins (VITAMIN B COMPLEX) TABS as directed Orally     [provider]  buPROPion (WELLBUTRIN XL) 150 MG 24 hr tablet Take 150 mg by mouth daily.  09/11/18   [provider]  buPROPion (ZYBAN) 150 MG 12 hr tablet 1 tablet in the morning Orally Once a day for 30 day(s)    [provider]  Calcium Carb-Cholecalciferol 600-5 MG-MCG TABS 1 tablet with food Orally Once a day for 30 day(s)    [provider]  calcium carbonate (TUMS - DOSED IN MG ELEMENTAL CALCIUM) 500 MG chewable tablet Chew 1 tablet (200 mg of elemental calcium total) by mouth 2 (two) times daily as needed for indigestion or heartburn. Patient not taking: Reported on 11/27/2022 10/25/20   Berton Mount I, MD  dextromethorphan-guaiFENesin [facility] DM) 30-600 MG 12hr tablet Take 1 tablet by mouth 2 (two) times daily. 12/22/22   Ellsworth Lennox, PA-C  fluticasone [facility]) 50 MCG/ACT nasal spray Place 2 sprays into both nostrils daily. 07/17/23   Viviano Simas, FNP  gabapentin (NEURONTIN) 600 MG tablet 1 tablet Orally Three times a day for 30 day(s)    [provider]  hydrOXYzine (ATARAX/VISTARIL) 25 MG tablet Take 1 tablet (25 mg total) by mouth 3 (three) times daily as needed for anxiety. Patient not taking: Reported on 11/27/2022 11/02/20   Love, Evlyn Kanner, PA-C  levothyroxine (SYNTHROID, LEVOTHROID) 50 MCG tablet Take 50 mcg by mouth daily before breakfast.  09/06/18   [provider]  lip balm (CARMEX) ointment Apply topically as needed for lip care. Patient not taking: Reported on 11/27/2022 10/25/20   Berton Mount I, MD  meclizine (ANTIVERT) 12.5 MG tablet Take 1 tablet (12.5 mg total) by mouth 3 (three) times daily as needed for dizziness. 07/17/23   Viviano Simas, FNP  melatonin 3 MG TABS tablet Take 1 tablet (3 mg total) by mouth at bedtime. Patient not taking: Reported on 11/27/2022 11/02/20   Love, Evlyn Kanner, PA-C  meloxicam (MOBIC) 15 MG tablet Take 15 mg by mouth daily. Patient not taking: Reported on 11/27/2022    [provider]  morphine (MSIR) 15 MG tablet 1 tablet as needed Orally Three times a day    [provider]  Multiple Vitamin (MULTIVITAMIN) LIQD Take 15 mLs by mouth daily. 10/26/20   Barnetta Chapel, MD  omeprazole (PRILOSEC) 20 MG capsule 1 capsule Orally Once a day for 30 day(s)    [provider]  ondansetron (ZOFRAN-ODT) 4 MG disintegrating tablet Take 1 tablet (4 mg total) by mouth every 8 (eight) hours as needed. 07/17/23   Viviano Simas, FNP  oxyCODONE-acetaminophen (PERCOCET) 7.5-325 MG tablet 0.5 tablet as needed Orally once a day    [provider]  polyethylene glycol (MIRALAX / GLYCOLAX) 17 g packet Take 17 g by mouth daily. Patient taking differently: Take 17 g by mouth daily as needed for mild constipation. 12/30/19   Carlena Bjornstad  A, PA-C  predniSONE (DELTASONE) 20 MG tablet Take 2 tablets (40 mg total) by mouth daily. 04/21/23   White, Elita Boone, NP  senna-docusate (SENOKOT-S) 8.6-50 MG tablet Take 2 tablets by mouth daily with supper. 11/02/20   Love, Evlyn Kanner, PA-C  terbinafine (LAMISIL) 250 MG tablet Take 1 tablet (250 mg total) by mouth daily. 08/06/23   Candelaria Stagers, DPM  traMADol (ULTRAM) 50 MG tablet Take 1 tablet (50 mg total) by mouth every 6 (six) hours as needed for severe pain. Patient not taking: Reported on 11/27/2022 11/02/20   Love, Evlyn Kanner, PA-C  traZODone (DESYREL) 100 MG tablet Take 100 mg by mouth at bedtime as needed for sleep.  03/01/18   [provider]  umeclidinium bromide (INCRUSE ELLIPTA) 62.5 MCG/INH AEPB Inhale 1 puff into the lungs daily. 10/26/20   Barnetta Chapel, MD  valACYclovir (VALTREX) 500 MG tablet Take 500 mg by mouth See admin instructions. Take 1 tablet by mouth twice daily for 3 days during outbreak 06/10/18   [provider]  zinc sulfate 220 (50 Zn) MG capsule Take 1 capsule (220 mg total) by mouth daily. Patient not taking: Reported on 11/27/2022 10/26/20   Barnetta Chapel, MD    Family  History Family History  Problem Relation Age of Onset   Alcohol abuse Father    Cancer Father    Heart disease Father    Cancer Mother    Cancer Maternal Grandfather    Heart disease Maternal Grandfather    Heart disease Paternal Grandmother    Diabetes Paternal Grandmother    Diabetes Paternal Grandfather     Social History Social History   Tobacco Use   Smoking status: Former   Smokeless tobacco: Never  Advertising account planner   Vaping status: Never Used  Substance Use Topics   Alcohol use: No   Drug use: No     Allergies   Patient has no known allergies.   Review of Systems Review of Systems Per HPI  Physical Exam Triage Vital Signs ED Triage Vitals  Encounter Vitals Group     BP 11/01/23 1333 (!) 181/76     Systolic BP Percentile --      Diastolic BP Percentile --      Pulse Rate 11/01/23 1333 76     Resp --      Temp 11/01/23 1333 97.6 F (36.4 C)     Temp Source 11/01/23 1333 Oral     SpO2 11/01/23 1333 98 %     Weight 11/01/23 1331 150 lb (68 kg)     Height 11/01/23 1331 5\' 4"  (1.626 m)     Head Circumference --      Peak Flow --      Pain Score 11/01/23 1331 8     Pain Loc --      Pain Education --      Exclude from Growth Chart --    No data found.  Updated Vital Signs BP (!) 181/76 (BP Location: Left Arm)   Pulse 76   Temp 97.6 F (36.4 C) (Oral)   Ht 5\' 4"  (1.626 m)   Wt 150 lb (68 kg)   SpO2 98%   BMI 25.75 kg/m   Visual Acuity Right Eye Distance:   Left Eye Distance:   Bilateral Distance:    Right Eye Near:   Left Eye Near:    Bilateral Near:     Physical Exam Constitutional:      General: She  is not in acute distress.    Appearance: Normal appearance. She is not toxic-appearing or diaphoretic.  HENT:     Head: Normocephalic and atraumatic.  Eyes:     Extraocular Movements: Extraocular movements intact.     Conjunctiva/sclera: Conjunctivae normal.  Cardiovascular:     Rate and Rhythm: Normal rate and regular rhythm.      Pulses: Normal pulses.     Heart sounds: Normal heart sounds.  Pulmonary:     Effort: Pulmonary effort is normal. No respiratory distress.     Breath sounds: Normal breath sounds.  Abdominal:     General: Bowel sounds are normal. There is no distension.     Palpations: Abdomen is soft.     Tenderness: There is abdominal tenderness in the right upper quadrant.     Comments: Patient is significantly tender to palpation to right upper quadrant of abdomen.  Neurological:     General: No focal deficit present.     Mental Status: She is alert and oriented to person, place, and time. Mental status is at baseline.  Psychiatric:        Mood and Affect: Mood normal.        Behavior: Behavior normal.        Thought Content: Thought content normal.        Judgment: Judgment normal.      UC Treatments / Results  Labs (all labs ordered are listed, but only abnormal results are displayed) Labs Reviewed - No data to display  EKG   Radiology No results found.  Procedures Procedures (including critical care time)  Medications Ordered in UC Medications - No data to display  Initial Impression / Assessment and Plan / UC Course  I have reviewed the triage vital signs and the nursing notes.  Pertinent labs & imaging results that were available during my care of the patient were reviewed by me and considered in my medical decision making (see chart for details).     Patient is significantly tender to palpation to right upper quadrant of abdomen so I do think that imaging is necessary which cannot be performed here at urgent care.  Blood pressure is also elevated which is most likely due to pain but this is worrisome as well.  Advised patient to go to the emergency department for further evaluation and she was agreeable with plan.  Vital signs stable at discharge.  Agree with patient self transport to the ER. Final Clinical Impressions(s) / UC Diagnoses   Final diagnoses:  Abdominal pain,  right upper quadrant     Discharge Instructions      Please go to the emergency apartment as soon as you leave urgent care for further evaluation and management.    ED Prescriptions   None    I have reviewed the PDMP during this encounter.   Gustavus Bryant, [place] 11/01/23 [phone]

## 2023-11-01 NOTE — ED Provider Triage Note (Signed)
Emergency Medicine Provider Triage Evaluation Note  Jean Shaffer , a 72 y.o. female  was evaluated in triage.  Pt complains of right upper quadrant abdominal pai.  Hx of gallstones   Review of Systems  Positive: nausea Negative: fever  Physical Exam  BP (!) 183/76 (BP Location: Left Arm)   Pulse 84   Temp 97.9 F (36.6 C) (Oral)   Resp 18   SpO2 98%  Gen:   Awake, no distress   Resp:  Normal effort  MSK:   Moves extremities without difficulty  Other:  Tender right upper quadrant.   Medical Decision Making  Medically screening exam initiated at 4:06 PM.  Appropriate orders placed.  Jean Shaffer was informed that the remainder of the evaluation will be completed by another provider, this initial triage assessment does not replace that evaluation, and the importance of remaining in the ED until their evaluation is complete.     Elson Areas, [place] 11/01/23 [phone]

## 2023-11-01 NOTE — ED Notes (Signed)
Patient is being discharged from the Urgent Care and sent to the Emergency Department via private vehicle . Per Laren Everts NP, patient is in need of higher level of care due to abdominal and back pain. Patient is aware and verbalizes understanding of plan of care.  Vitals:   11/01/23 1333  BP: (!) 181/76  Pulse: 76  Temp: 97.6 F (36.4 C)  SpO2: 98%

## 2023-11-01 NOTE — ED Notes (Signed)
Attempted stick pt states she usally use ultra sound to find vein

## 2023-11-01 NOTE — ED Triage Notes (Signed)
Pt presents with c/o right upper quadrant abdominal pain that started on Monday. Pt reports the pain has gotten progressively worse during the week.

## 2023-11-01 NOTE — ED Triage Notes (Signed)
Patient presents with right flank pain that travels to lower back. Pain started on Monday. Treated with Ibuprofen/Tylenol and hot compress.

## 2023-11-01 NOTE — Discharge Instructions (Signed)
Please go to the emergency apartment as soon as you leave urgent care for further evaluation and management.

## 2023-11-01 NOTE — ED Provider Notes (Signed)
[place] EMERGENC[facility] DEPARTMENT AT [facility] Provider Note   CSN: [id number] Arrival date & time: 11/01/23  1457     History  Chief Complaint  Patient presents with   Abdominal Pain    Jean Shaffer is a 72 y.o. female history of gallstones, here presenting with abdominal pain.  Patient had right upper quadrant pain that radiated to her back since yesterday.  Patient felt nauseated but no vomiting.  Patient went to urgent care was sent here for imaging.  Patient states that she has history of gallstones but never had a cholecystectomy.  Patient also states that she has some lower abdominal pain as well.  The history is provided by the patient.       Home Medications Prior to Admission medications   Medication Sig Start Date End Date Taking? Authorizing Provider  acetaminophen (TYLENOL) 325 MG tablet Take 1-2 tablets (325-650 mg total) by mouth every 4 (four) hours as needed for mild pain. 10/26/20   Love, Evlyn Kanner, PA-C  albuterol (VENTOLIN HFA) 108 (90 Base) MCG/ACT inhaler Inhale into the lungs. 04/23/20   [provider]  amLODipine (NORVASC) 2.5 MG tablet Take 5 mg by mouth daily.    [provider]  B Complex Vitamins (VITAMIN B COMPLEX) TABS as directed Orally    [provider]  buPROPion (WELLBUTRIN XL) 150 MG 24 hr tablet Take 150 mg by mouth daily.  09/11/18   [provider]  buPROPion (ZYBAN) 150 MG 12 hr tablet 1 tablet in the morning Orally Once a day for 30 day(s)    [provider]  Calcium Carb-Cholecalciferol 600-5 MG-MCG TABS 1 tablet with food Orally Once a day for 30 day(s)    [provider]  calcium carbonate (TUMS - DOSED IN MG ELEMENTAL CALCIUM) 500 MG chewable tablet Chew 1 tablet (200 mg of elemental calcium total) by mouth 2 (two) times daily as needed for indigestion or heartburn. Patient not taking: Reported on 11/27/2022 10/25/20   Berton Mount I, MD  dextromethorphan-guaiFENesin  [facility] DM) 30-600 MG 12hr tablet Take 1 tablet by mouth 2 (two) times daily. 12/22/22   Ellsworth Lennox, PA-C  fluticasone [facility]) 50 MCG/ACT nasal spray Place 2 sprays into both nostrils daily. 07/17/23   Viviano Simas, FNP  gabapentin (NEURONTIN) 600 MG tablet 1 tablet Orally Three times a day for 30 day(s)    [provider]  hydrOXYzine (ATARAX/VISTARIL) 25 MG tablet Take 1 tablet (25 mg total) by mouth 3 (three) times daily as needed for anxiety. Patient not taking: Reported on 11/27/2022 11/02/20   Love, Evlyn Kanner, PA-C  levothyroxine (SYNTHROID, LEVOTHROID) 50 MCG tablet Take 50 mcg by mouth daily before breakfast.  09/06/18   [provider]  lip balm (CARMEX) ointment Apply topically as needed for lip care. Patient not taking: Reported on 11/27/2022 10/25/20   Berton Mount I, MD  meclizine (ANTIVERT) 12.5 MG tablet Take 1 tablet (12.5 mg total) by mouth 3 (three) times daily as needed for dizziness. 07/17/23   Viviano Simas, FNP  melatonin 3 MG TABS tablet Take 1 tablet (3 mg total) by mouth at bedtime. Patient not taking: Reported on 11/27/2022 11/02/20   Love, Evlyn Kanner, PA-C  meloxicam (MOBIC) 15 MG tablet Take 15 mg by mouth daily. Patient not taking: Reported on 11/27/2022    [provider]  morphine (MSIR) 15 MG tablet 1 tablet as needed Orally Three times a day    [provider]  Multiple Vitamin (MULTIVITAMIN) LIQD Take 15 mLs by mouth daily. 10/26/20   Barnetta Chapel, MD  omeprazole (PRILOSEC) 20 MG capsule 1 capsule Orally Once a day for 30 day(s)    [provider]  ondansetron (ZOFRAN-ODT) 4 MG disintegrating tablet Take 1 tablet (4 mg total) by mouth every 8 (eight) hours as needed. 07/17/23   Viviano Simas, FNP  oxyCODONE-acetaminophen (PERCOCET) 7.5-325 MG tablet 0.5 tablet as needed Orally once a day    [provider]  polyethylene glycol (MIRALAX / GLYCOLAX) 17 g packet Take 17 g by mouth daily. Patient taking  differently: Take 17 g by mouth daily as needed for mild constipation. 12/30/19   Meuth, Brooke A, PA-C  predniSONE (DELTASONE) 20 MG tablet Take 2 tablets (40 mg total) by mouth daily. 04/21/23   White, Elita Boone, NP  senna-docusate (SENOKOT-S) 8.6-50 MG tablet Take 2 tablets by mouth daily with supper. 11/02/20   Love, Evlyn Kanner, PA-C  terbinafine (LAMISIL) 250 MG tablet Take 1 tablet (250 mg total) by mouth daily. 08/06/23   Candelaria Stagers, DPM  traMADol (ULTRAM) 50 MG tablet Take 1 tablet (50 mg total) by mouth every 6 (six) hours as needed for severe pain. Patient not taking: Reported on 11/27/2022 11/02/20   Love, Evlyn Kanner, PA-C  traZODone (DESYREL) 100 MG tablet Take 100 mg by mouth at bedtime as needed for sleep.  03/01/18   [provider]  umeclidinium bromide (INCRUSE ELLIPTA) 62.5 MCG/INH AEPB Inhale 1 puff into the lungs daily. 10/26/20   Barnetta Chapel, MD  valACYclovir (VALTREX) 500 MG tablet Take 500 mg by mouth See admin instructions. Take 1 tablet by mouth twice daily for 3 days during outbreak 06/10/18   [provider]  zinc sulfate 220 (50 Zn) MG capsule Take 1 capsule (220 mg total) by mouth daily. Patient not taking: Reported on 11/27/2022 10/26/20   Barnetta Chapel, MD      Allergies    Patient has no known allergies.    Review of Systems   Review of Systems  Gastrointestinal:  Positive for abdominal pain.  All other systems reviewed and are negative.   Physical Exam Updated Vital Signs BP (!) 159/76 (BP Location: Left Arm)   Pulse 74   Temp 97.9 F (36.6 C) (Oral)   Resp 18   SpO2 98%  Physical Exam Vitals and nursing note reviewed.  Constitutional:      Comments: Uncomfortable  Eyes:     Extraocular Movements: Extraocular movements intact.     Pupils: Pupils are equal, round, and reactive to light.  Cardiovascular:     Rate and Rhythm: Normal rate and regular rhythm.     Heart sounds: Normal heart sounds.  Pulmonary:     Effort:  Pulmonary effort is normal.     Breath sounds: Normal breath sounds.  Abdominal:     General: Abdomen is flat.     Comments: Mild right upper quadrant tenderness and suprapubic tenderness  Skin:    General: Skin is warm.     Capillary Refill: Capillary refill takes less than 2 seconds.  Neurological:     General: No focal deficit present.     Mental Status: She is oriented to person, place, and time.  Psychiatric:        Mood and Affect: Mood normal.        Behavior: Behavior normal.     ED Results / Procedures / Treatments   Labs (all labs ordered  are listed, but only abnormal results are displayed) Labs Reviewed  CBC - Abnormal; Notable for the following components:      Result Value   WBC 10.8 (*)    All other components within normal limits  URINALYSIS, ROUTINE W REFLEX MICROSCOPIC - Abnormal; Notable for the following components:   Leukocytes,Ua TRACE (*)    All other components within normal limits  LIPASE, BLOOD  COMPREHENSIVE METABOLIC PANEL    EKG None  Radiology No results found.  Procedures Procedures    Angiocath insertion Performed by: Jean Shaffer  Consent: Verbal consent obtained. Risks and benefits: risks, benefits and alternatives were discussed Time out: Immediately prior to procedure a "time out" was called to verify the correct patient, procedure, equipment, support staff and site/side marked as required.  Preparation: Patient was prepped and draped in the usual sterile fashion.  Vein Location: L antecube   Ultrasound Guided  Gauge: 20 long   Normal blood return and flush without difficulty Patient tolerance: Patient tolerated the procedure well with no immediate complications.    Medications Ordered in ED Medications  morphine (PF) 4 MG/ML injection 4 mg (has no administration in time range)  ondansetron (ZOFRAN) injection 4 mg (has no administration in time range)  sodium chloride 0.9 % bolus 1,000 mL (has no administration in  time range)    ED Course/ Medical Decision Making/ A&P                                 Medical Decision Making FELCIA HUEBERT is a 72 y.o. female here presenting with right upper quadrant and suprapubic pain.  Concern for gallstones versus UTI versus colitis.  Plan to get CBC and CMP and UA and CT abdomen pelvis.  Will give pain medicine and reassess.   11:16 PM Patient's blood pressure improved to 150s from 190s.  I reviewed patient's labs and CBC and CMP unremarkable.  Troponin negative x 1.  CT abdomen pelvis and ultrasound showed cholelithiasis without cholecystitis.  Patient's pain is improved now.  At this point I think she is stable for discharge and can follow-up with surgery outpatient.  Problems Addressed: Calculus of bile duct without cholecystitis and without obstruction: acute illness or injury  Amount and/or Complexity of Data Reviewed Labs: ordered. Decision-making details documented in ED Course. Radiology: ordered and independent interpretation performed. Decision-making details documented in ED Course.  Risk Prescription drug management.    Final Clinical Impression(s) / ED Diagnoses Final diagnoses:  None    Rx / DC Orders ED Discharge Orders     None         Charlynne Pander, MD 11/01/23 2317

## 2023-11-02 DIAGNOSIS — K8070 Calculus of gallbladder and bile duct without cholecystitis without obstruction: Secondary | ICD-10-CM | POA: Diagnosis not present

## 2023-11-06 ENCOUNTER — Ambulatory Visit: Payer: Self-pay | Admitting: General Surgery

## 2023-11-25 NOTE — Progress Notes (Signed)
COVID Vaccine received:  []  No [x]  Yes Date of any COVID positive Test in last 90 days:  none  PCP - Delbert Harness, MD  Cardiologist - none Pulmonary- Audie Box, DO  Chest x-ray - 10-16-2020  1v  Epic EKG -  10-01-2020  Epic Stress Test -  ECHO - 12-07-2018 Epic Cardiac Cath -   PCR screen: []  Ordered & Completed []   No Order but Needs PROFEND     [x]   N/A for this surgery  Surgery Plan:  [x]  Ambulatory   []  Outpatient in bed  []  Admit Anesthesia:    [x]  General  []  Spinal  []   Choice []   MAC  Bowel Prep - [x]  No  []   Yes ___Clear liquids___pt aware  Pacemaker / ICD device [x]  No []  Yes   Spinal Cord Stimulator:[x]  No []  Yes       History of Sleep Apnea? [x]  No []  Yes   CPAP used?- [x]  No []  Yes    Does the patient monitor blood sugar?   [x]  N/A   []  No []  Yes  Patient has: [x]  NO Hx DM   []  Pre-DM   []  DM1  []   DM2  Blood Thinner / Instructions: none Aspirin Instructions:  None  ERAS Protocol Ordered: []  No  [x]  Yes PRE-SURGERY []  ENSURE  []  G2   [x]  No Drink Ordered Patient is to be NPO after: 0530  Dental hx: []  Dentures:  []  N/A      []  Bridge or Partial:                   [x]  Loose or Damaged teeth: broken teeth upper front   Activity level: Patient is able to climb a flight of stairs without difficulty; [x]  No CP  [x]  No SOB. Patient can  perform ADLs without assistance.   Anesthesia review: HTN, COPD, MDD, Failed back- opioid dependence, HOH- no HAs.  Hx Syncope / TIA,  ACDF C4-5, C5-6 (2001), PLIF L4-5, L5-S1 (2008)  Patient denies shortness of breath, fever, cough and chest pain at PAT appointment.  Patient verbalized understanding and agreement to the Pre-Surgical Instructions that were given to them at this PAT appointment. Patient was also educated of the need to review these PAT instructions again prior to her surgery.I reviewed the appropriate phone numbers to call if they have any and questions or concerns.

## 2023-11-25 NOTE — Patient Instructions (Signed)
SURGICAL WAITING ROOM VISITATION Patients having surgery or a procedure may have no more than 2 support people in the waiting area - these visitors may rotate in the visitor waiting room.   Due to an increase in RSV and influenza rates and associated hospitalizations, children ages 19 and under may not visit patients in [facility] hospitals. If the patient needs to stay at the hospital during part of their recovery, the visitor guidelines for inpatient rooms apply.  PRE-OP VISITATION  Pre-op nurse will coordinate an appropriate time for 1 support person to accompany the patient in pre-op.  This support person may not rotate.  This visitor will be contacted when the time is appropriate for the visitor to come back in the pre-op area.  Please refer to the [facility] website for the visitor guidelines for Inpatients (after your surgery is over and you are in a regular room).  You are not required to quarantine at this time prior to your surgery. However, you must do this: Hand Hygiene often Do NOT share personal items Notify your provider if you are in close contact with someone who has COVID or you develop fever 100.4 or greater, new onset of sneezing, cough, sore throat, shortness of breath or body aches.  If you test positive for Covid or have been in contact with anyone that has tested positive in the last 10 days please notify you surgeon.    Your procedure is scheduled on:  December 06, 2023  Report to [facility] Main Entrance: [address]sen entrance where the [employer] is available.   Report to admitting at: 06:15    AM  Call this number if you have any questions or problems the morning of surgery [phone]  FOLLOW ANY ADDITIONAL PRE OP INSTRUCTIONS YOU RECEIVED FROM YOUR SURGEON'S OFFICE!!! DAY BEFORE SURGERY:  Have a Clear liquid diet   Do not eat food after Midnight the night prior to your surgery/procedure.  After Midnight you may have the following liquids until   05:30  AM DAY OF SURGERY  Clear Liquid Diet Water Black Coffee (sugar ok, NO MILK/CREAM OR CREAMERS)  Tea (sugar ok, NO MILK/CREAM OR CREAMERS) regular and decaf                             Plain Jell-O  with no fruit (NO RED)                                           Fruit ices (not with fruit pulp, NO RED)                                     Popsicles (NO RED)                                                                  Juice: NO CITRUS JUICES: only apple, WHITE grape, WHITE cranberry Sports drinks like Gatorade or Powerade (NO RED)               Oral  Hygiene is also important to reduce your risk of infection.        Remember - BRUSH YOUR TEETH THE MORNING OF SURGERY WITH YOUR REGULAR TOOTHPASTE  Do NOT smoke after Midnight the night before surgery.  STOP TAKING all Vitamins, Herbs and supplements 1 week before your surgery.   Take ONLY these medicines the morning of surgery with A SIP OF WATER: Bupropion (Wellbutrin), amlodipine, levothyroxine, You may use your Albuterol and Incruse Ellipta Inhalers.  You may take EITHER Tylenol OR Percocet if needed for pain.                   You may not have any metal on your body including hair pins, jewelry, and body piercing  Do not wear make-up, lotions, powders, perfumes or deodorant  Do not wear nail polish including gel and S&S, artificial / acrylic nails, or any other type of covering on natural nails including finger and toenails. If you have artificial nails, gel coating, etc., that needs to be removed by a nail salon, Please have this removed prior to surgery. Not doing so may mean that your surgery could be cancelled or delayed if the Surgeon or anesthesia staff feels like they are unable to monitor you safely.   Do not shave 48 hours prior to surgery to avoid nicks in your skin which may contribute to postoperative infections.    Contacts, Hearing Aids, dentures or bridgework may not be worn into surgery. DENTURES WILL BE  REMOVED PRIOR TO SURGERY PLEASE DO NOT APPLY "Poly grip" OR ADHESIVES!!!  Patients discharged on the day of surgery will not be allowed to drive home.  Someone NEEDS to stay with you for the first 24 hours after anesthesia.  Do not bring your home medications to the hospital. The Pharmacy will dispense medications listed on your medication list to you during your admission in the Hospital.  Please read over the following fact sheets you were given: IF YOU HAVE QUESTIONS ABOUT YOUR PRE-OP INSTRUCTIONS, PLEASE CALL [phone]   [facility] - Preparing for Surgery Before surgery, you can play an important role.  Because skin is not sterile, your skin needs to be as free of germs as possible.  You can reduce the number of germs on your skin by washing with CHG (chlorahexidine gluconate) soap before surgery.  CHG is an antiseptic cleaner which kills germs and bonds with the skin to continue killing germs even after washing. Please DO NOT use if you have an allergy to CHG or antibacterial soaps.  If your skin becomes reddened/irritated stop using the CHG and inform your nurse when you arrive at Short Stay. Do not shave (including legs and underarms) for at least 48 hours prior to the first CHG shower.  You may shave your face/neck.  Please follow these instructions carefully:  1.  Shower with CHG Soap the night before surgery and the  morning of surgery.  2.  If you choose to wash your hair, wash your hair first as usual with your normal  shampoo.  3.  After you shampoo, rinse your hair and body thoroughly to remove the shampoo.                             4.  Use CHG as you would any other liquid soap.  You can apply chg directly to the skin and wash.  Gently with a scrungie or clean washcloth.  5.  Apply the CHG Soap to your body ONLY FROM THE NECK DOWN.   Do not use on face/ open                           Wound or open sores. Avoid contact with eyes, ears mouth and genitals (private parts).                        Wash face,  Genitals (private parts) with your normal soap.             6.  Wash thoroughly, paying special attention to the area where your  surgery  will be performed.  7.  Thoroughly rinse your body with warm water from the neck down.  8.  DO NOT shower/wash with your normal soap after using and rinsing off the CHG Soap.            9.  Pat yourself dry with a clean towel.            10.  Wear clean pajamas.            11.  Place clean sheets on your bed the night of your first shower and do not  sleep with pets.  ON THE DAY OF SURGERY : Do not apply any lotions/deodorants the morning of surgery.  Please wear clean clothes to the hospital/surgery center.     FAILURE TO FOLLOW THESE INSTRUCTIONS MAY RESULT IN THE CANCELLATION OF YOUR SURGERY  PATIENT SIGNATURE_________________________________  NURSE SIGNATURE__________________________________  ________________________________________________________________________

## 2023-11-26 ENCOUNTER — Other Ambulatory Visit: Payer: Self-pay

## 2023-11-26 ENCOUNTER — Encounter (HOSPITAL_COMMUNITY)
Admission: RE | Admit: 2023-11-26 | Discharge: 2023-11-26 | Disposition: A | Discharge status: Home / Self Care | Payer: Medicare Other | Source: Ambulatory Visit | Attending: General Surgery | Admitting: General Surgery

## 2023-11-26 ENCOUNTER — Encounter (HOSPITAL_COMMUNITY): Payer: Self-pay

## 2023-11-26 VITALS — BP 138/58 | HR 78 | Temp 98.6°F | Resp 18 | Ht 64.5 in | Wt 150.0 lb

## 2023-11-26 DIAGNOSIS — Z01818 Encounter for other preprocedural examination: Secondary | ICD-10-CM | POA: Insufficient documentation

## 2023-11-26 DIAGNOSIS — I10 Essential (primary) hypertension: Secondary | ICD-10-CM | POA: Diagnosis not present

## 2023-11-26 DIAGNOSIS — Z01812 Encounter for preprocedural laboratory examination: Secondary | ICD-10-CM | POA: Diagnosis present

## 2023-11-26 DIAGNOSIS — F1120 Opioid dependence, uncomplicated: Secondary | ICD-10-CM | POA: Insufficient documentation

## 2023-11-26 DIAGNOSIS — I440 Atrioventricular block, first degree: Secondary | ICD-10-CM | POA: Diagnosis not present

## 2023-11-26 DIAGNOSIS — Z01810 Encounter for preprocedural cardiovascular examination: Secondary | ICD-10-CM | POA: Diagnosis present

## 2023-11-26 HISTORY — DX: Chronic kidney disease, unspecified: N18.9

## 2023-11-26 HISTORY — DX: Depression, unspecified: F32.A

## 2023-11-26 HISTORY — DX: Myoneural disorder, unspecified: G70.9

## 2023-11-26 HISTORY — DX: Gastro-esophageal reflux disease without esophagitis: K21.9

## 2023-11-26 HISTORY — DX: Pneumonia, unspecified organism: J18.9

## 2023-11-26 LAB — COMPREHENSIVE METABOLIC PANEL
ALT: 24 U/L (ref 0–44)
AST: 24 U/L (ref 15–41)
Albumin: 3.9 g/dL (ref 3.5–5.0)
Alkaline Phosphatase: 105 U/L (ref 38–126)
Anion gap: 5 (ref 5–15)
BUN: 14 mg/dL (ref 8–23)
CO2: 25 mmol/L (ref 22–32)
Calcium: 9.4 mg/dL (ref 8.9–10.3)
Chloride: 106 mmol/L (ref 98–111)
Creatinine, Ser: 0.95 mg/dL (ref 0.44–1.00)
GFR, Estimated: >60 mL/min (ref >60)
Glucose, Bld: 86 mg/dL (ref 70–99)
Potassium: 4.4 mmol/L (ref 3.5–5.1)
Sodium: 136 mmol/L (ref 135–145)
Total Bilirubin: 0.4 mg/dL (ref <1.2)
Total Protein: 7.3 g/dL (ref 6.5–8.1)

## 2023-11-26 LAB — CBC
HCT: 39.5 % (ref 36.0–46.0)
Hemoglobin: 12.9 g/dL (ref 12.0–15.0)
MCH: 29.3 pg (ref 26.0–34.0)
MCHC: 32.7 g/dL (ref 30.0–36.0)
MCV: 89.8 fL (ref 80.0–100.0)
Platelets: 303 10*3/uL (ref 150–400)
RBC: 4.4 MIL/uL (ref 3.87–5.11)
RDW: 13.5 % (ref 11.5–15.5)
WBC: 12 10*3/uL — ABNORMAL HIGH (ref 4.0–10.5)
nRBC: 0 % (ref 0.0–0.2)

## 2023-11-27 ENCOUNTER — Ambulatory Visit (HOSPITAL_BASED_OUTPATIENT_CLINIC_OR_DEPARTMENT_OTHER)
Admission: RE | Admit: 2023-11-27 | Discharge: 2023-11-27 | Disposition: A | Discharge status: Home / Self Care | Payer: Medicare Other | Source: Ambulatory Visit | Attending: Pulmonary Disease | Admitting: Pulmonary Disease

## 2023-11-27 DIAGNOSIS — Z8616 Personal history of COVID-19: Secondary | ICD-10-CM | POA: Diagnosis present

## 2023-11-27 DIAGNOSIS — R942 Abnormal results of pulmonary function studies: Secondary | ICD-10-CM | POA: Diagnosis present

## 2023-11-27 DIAGNOSIS — J984 Other disorders of lung: Secondary | ICD-10-CM

## 2023-11-30 ENCOUNTER — Other Ambulatory Visit: Payer: Medicare Other

## 2023-12-05 NOTE — Anesthesia Preprocedure Evaluation (Signed)
Anesthesia Evaluation  Patient identified by MRN, date of birth, ID band Patient awake    Reviewed: Allergy & Precautions, NPO status , Patient's Chart, lab work & pertinent test results  Airway Mallampati: IV  TM Distance: <3 FB Neck ROM: Full    Dental  (+) Teeth Intact, Dental Advisory Given, Chipped,    Pulmonary asthma , COPD, former smoker   Pulmonary exam normal breath sounds clear to auscultation       Cardiovascular hypertension, Pt. on medications Normal cardiovascular exam Rhythm:Regular Rate:Normal     Neuro/Psych  PSYCHIATRIC DISORDERS Anxiety Depression    TIA Neuromuscular disease CVA    GI/Hepatic ,GERD  ,,GALLSTONES   Endo/Other  Hypothyroidism    Renal/GU Renal InsufficiencyRenal disease     Musculoskeletal  (+) Arthritis ,    Abdominal   Peds  Hematology negative hematology ROS (+)   Anesthesia Other Findings   Reproductive/Obstetrics                             Anesthesia Physical Anesthesia Plan  ASA: 3  Anesthesia Plan: General   Post-op Pain Management: Tylenol PO (pre-op)* and Toradol IV (intra-op)*   Induction: Intravenous  PONV Risk Score and Plan: 3 and Dexamethasone and Ondansetron  Airway Management Planned: Oral ETT and Video Laryngoscope Planned  Additional Equipment:   Intra-op Plan:   Post-operative Plan: Extubation in OR  Informed Consent: I have reviewed the patients History and Physical, chart, labs and discussed the procedure including the risks, benefits and alternatives for the proposed anesthesia with the patient or authorized representative who has indicated his/her understanding and acceptance.     Dental advisory given  Plan Discussed with: CRNA  Anesthesia Plan Comments:        Anesthesia Quick Evaluation

## 2023-12-06 ENCOUNTER — Ambulatory Visit (HOSPITAL_COMMUNITY)
Admission: RE | Admit: 2023-12-06 | Discharge: 2023-12-06 | Disposition: A | Discharge status: Home / Self Care | Payer: Medicare Other | Attending: General Surgery | Admitting: General Surgery

## 2023-12-06 ENCOUNTER — Other Ambulatory Visit: Payer: Self-pay

## 2023-12-06 ENCOUNTER — Encounter (HOSPITAL_COMMUNITY): Payer: Self-pay | Admitting: General Surgery

## 2023-12-06 ENCOUNTER — Ambulatory Visit (HOSPITAL_COMMUNITY): Payer: Medicare Other

## 2023-12-06 ENCOUNTER — Encounter (HOSPITAL_COMMUNITY)
Admission: RE | Disposition: A | Discharge status: Home / Self Care | Payer: Self-pay | Source: Home / Self Care | Attending: General Surgery

## 2023-12-06 ENCOUNTER — Ambulatory Visit (HOSPITAL_COMMUNITY): Payer: Self-pay | Admitting: Anesthesiology

## 2023-12-06 ENCOUNTER — Ambulatory Visit (HOSPITAL_BASED_OUTPATIENT_CLINIC_OR_DEPARTMENT_OTHER): Payer: Medicare Other | Admitting: Anesthesiology

## 2023-12-06 DIAGNOSIS — K8010 Calculus of gallbladder with chronic cholecystitis without obstruction: Secondary | ICD-10-CM | POA: Diagnosis present

## 2023-12-06 DIAGNOSIS — J4489 Other specified chronic obstructive pulmonary disease: Secondary | ICD-10-CM | POA: Diagnosis not present

## 2023-12-06 DIAGNOSIS — I10 Essential (primary) hypertension: Secondary | ICD-10-CM | POA: Insufficient documentation

## 2023-12-06 DIAGNOSIS — K8020 Calculus of gallbladder without cholecystitis without obstruction: Secondary | ICD-10-CM | POA: Diagnosis not present

## 2023-12-06 DIAGNOSIS — K219 Gastro-esophageal reflux disease without esophagitis: Secondary | ICD-10-CM | POA: Diagnosis not present

## 2023-12-06 DIAGNOSIS — Z87891 Personal history of nicotine dependence: Secondary | ICD-10-CM | POA: Insufficient documentation

## 2023-12-06 HISTORY — PX: CHOLECYSTECTOMY: SHX55

## 2023-12-06 SURGERY — LAPAROSCOPIC CHOLECYSTECTOMY WITH INTRAOPERATIVE CHOLANGIOGRAM
Anesthesia: General

## 2023-12-06 MED ORDER — ACETAMINOPHEN 500 MG PO TABS
1000.0000 mg | ORAL_TABLET | ORAL | Status: AC
  Administered 2023-12-06: 1000 mg via ORAL
  Filled 2023-12-06: qty 2

## 2023-12-06 MED ORDER — FENTANYL CITRATE PF 50 MCG/ML IJ SOSY
PREFILLED_SYRINGE | INTRAMUSCULAR | Status: AC
  Filled 2023-12-06: qty 1

## 2023-12-06 MED ORDER — ROCURONIUM BROMIDE 100 MG/10ML IV SOLN
INTRAVENOUS | Status: DC | PRN
  Administered 2023-12-06: 50 mg via INTRAVENOUS

## 2023-12-06 MED ORDER — 0.9 % SODIUM CHLORIDE (POUR BTL) OPTIME
TOPICAL | Status: DC | PRN
  Administered 2023-12-06: 1000 mL

## 2023-12-06 MED ORDER — CHLORHEXIDINE GLUCONATE CLOTH 2 % EX PADS: 6.0000 | MEDICATED_PAD | Freq: Once | CUTANEOUS | Status: DC

## 2023-12-06 MED ORDER — GABAPENTIN 100 MG PO CAPS
100.0000 mg | ORAL_CAPSULE | ORAL | Status: DC
  Filled 2023-12-06: qty 1

## 2023-12-06 MED ORDER — MIDAZOLAM HCL 2 MG/2ML IJ SOLN
INTRAMUSCULAR | Status: AC
  Filled 2023-12-06: qty 2

## 2023-12-06 MED ORDER — FENTANYL CITRATE (PF) 100 MCG/2ML IJ SOLN
INTRAMUSCULAR | Status: AC
  Filled 2023-12-06: qty 2

## 2023-12-06 MED ORDER — FENTANYL CITRATE PF 50 MCG/ML IJ SOSY
25.0000 ug | PREFILLED_SYRINGE | INTRAMUSCULAR | Status: DC | PRN
  Administered 2023-12-06: 50 ug via INTRAVENOUS
  Administered 2023-12-06: 25 ug via INTRAVENOUS
  Administered 2023-12-06: 50 ug via INTRAVENOUS

## 2023-12-06 MED ORDER — PROPOFOL 500 MG/50ML IV EMUL
INTRAVENOUS | Status: AC
  Filled 2023-12-06: qty 50

## 2023-12-06 MED ORDER — CHLORHEXIDINE GLUCONATE 0.12 % MT SOLN
15.0000 mL | Freq: Once | OROMUCOSAL | Status: AC
  Administered 2023-12-06: 15 mL via OROMUCOSAL

## 2023-12-06 MED ORDER — LIDOCAINE HCL (CARDIAC) PF 100 MG/5ML IV SOSY
PREFILLED_SYRINGE | INTRAVENOUS | Status: DC | PRN
  Administered 2023-12-06: 60 mg via INTRAVENOUS

## 2023-12-06 MED ORDER — LACTATED RINGERS IR SOLN
Status: DC | PRN
Start: 2023-12-06 — End: 2023-12-06
  Administered 2023-12-06: 1000 mL

## 2023-12-06 MED ORDER — ONDANSETRON HCL 4 MG/2ML IJ SOLN
INTRAMUSCULAR | Status: AC
  Filled 2023-12-06: qty 2

## 2023-12-06 MED ORDER — PROPOFOL 10 MG/ML IV BOLUS
INTRAVENOUS | Status: AC
  Filled 2023-12-06: qty 20

## 2023-12-06 MED ORDER — MIDAZOLAM HCL 5 MG/5ML IJ SOLN
INTRAMUSCULAR | Status: DC | PRN
  Administered 2023-12-06 (×2): 1 mg via INTRAVENOUS

## 2023-12-06 MED ORDER — FENTANYL CITRATE PF 50 MCG/ML IJ SOSY
PREFILLED_SYRINGE | INTRAMUSCULAR | Status: AC
  Administered 2023-12-06: 25 ug via INTRAVENOUS
  Filled 2023-12-06: qty 2

## 2023-12-06 MED ORDER — AMISULPRIDE (ANTIEMETIC) 5 MG/2ML IV SOLN
10.0000 mg | Freq: Once | INTRAVENOUS | Status: AC
Start: 2023-12-06 — End: 2023-12-06
  Administered 2023-12-06: 10 mg via INTRAVENOUS

## 2023-12-06 MED ORDER — OXYCODONE HCL 5 MG PO TABS: 5.0000 mg | ORAL_TABLET | Freq: Four times a day (QID) | ORAL | 0 refills | Status: DC | PRN

## 2023-12-06 MED ORDER — DEXAMETHASONE SODIUM PHOSPHATE 10 MG/ML IJ SOLN
INTRAMUSCULAR | Status: DC | PRN
  Administered 2023-12-06: 5 mg via INTRAVENOUS

## 2023-12-06 MED ORDER — SUGAMMADEX SODIUM 200 MG/2ML IV SOLN
INTRAVENOUS | Status: DC | PRN
  Administered 2023-12-06: 200 mg via INTRAVENOUS

## 2023-12-06 MED ORDER — ONDANSETRON HCL 4 MG/2ML IJ SOLN
INTRAMUSCULAR | Status: DC | PRN
  Administered 2023-12-06: 4 mg via INTRAVENOUS

## 2023-12-06 MED ORDER — AMISULPRIDE (ANTIEMETIC) 5 MG/2ML IV SOLN
INTRAVENOUS | Status: AC
  Filled 2023-12-06: qty 4

## 2023-12-06 MED ORDER — ROCURONIUM BROMIDE 10 MG/ML (PF) SYRINGE
PREFILLED_SYRINGE | INTRAVENOUS | Status: AC
  Filled 2023-12-06: qty 10

## 2023-12-06 MED ORDER — BUPIVACAINE-EPINEPHRINE 0.25% -1:200000 IJ SOLN
INTRAMUSCULAR | Status: DC | PRN
  Administered 2023-12-06: 20 mL

## 2023-12-06 MED ORDER — ONDANSETRON HCL 4 MG/2ML IJ SOLN
4.0000 mg | Freq: Once | INTRAMUSCULAR | Status: AC | PRN
  Administered 2023-12-06: 4 mg via INTRAVENOUS

## 2023-12-06 MED ORDER — KETOROLAC TROMETHAMINE 15 MG/ML IJ SOLN
INTRAMUSCULAR | Status: DC | PRN
  Administered 2023-12-06: 15 mg via INTRAVENOUS

## 2023-12-06 MED ORDER — PHENYLEPHRINE HCL-NACL 20-0.9 MG/250ML-% IV SOLN
INTRAVENOUS | Status: AC
  Filled 2023-12-06: qty 250

## 2023-12-06 MED ORDER — CEFAZOLIN SODIUM-DEXTROSE 2-4 GM/100ML-% IV SOLN
2.0000 g | INTRAVENOUS | Status: AC
  Administered 2023-12-06: 2 g via INTRAVENOUS
  Filled 2023-12-06: qty 100

## 2023-12-06 MED ORDER — ORAL CARE MOUTH RINSE: 15.0000 mL | Freq: Once | OROMUCOSAL | Status: AC

## 2023-12-06 MED ORDER — DEXAMETHASONE SODIUM PHOSPHATE 10 MG/ML IJ SOLN
INTRAMUSCULAR | Status: AC
  Filled 2023-12-06: qty 1

## 2023-12-06 MED ORDER — BUPIVACAINE-EPINEPHRINE 0.25% -1:200000 IJ SOLN
INTRAMUSCULAR | Status: AC
  Filled 2023-12-06: qty 1

## 2023-12-06 MED ORDER — DEXMEDETOMIDINE HCL IN NACL 80 MCG/20ML IV SOLN
INTRAVENOUS | Status: DC | PRN
  Administered 2023-12-06: 6 ug via INTRAVENOUS

## 2023-12-06 MED ORDER — FENTANYL CITRATE (PF) 100 MCG/2ML IJ SOLN
INTRAMUSCULAR | Status: DC | PRN
  Administered 2023-12-06 (×2): 50 ug via INTRAVENOUS

## 2023-12-06 MED ORDER — PHENYLEPHRINE HCL (PRESSORS) 10 MG/ML IV SOLN
INTRAVENOUS | Status: DC | PRN
  Administered 2023-12-06: 80 ug via INTRAVENOUS

## 2023-12-06 MED ORDER — LIDOCAINE HCL (PF) 2 % IJ SOLN
INTRAMUSCULAR | Status: AC
  Filled 2023-12-06: qty 5

## 2023-12-06 MED ORDER — PROPOFOL 10 MG/ML IV BOLUS
INTRAVENOUS | Status: DC | PRN
  Administered 2023-12-06: 120 mg via INTRAVENOUS
  Administered 2023-12-06: 50 ug/kg/min via INTRAVENOUS

## 2023-12-06 MED ORDER — LACTATED RINGERS IV SOLN: INTRAVENOUS | Status: DC

## 2023-12-06 SURGICAL SUPPLY — 32 items
APPLIER CLIP 5 13 M/L LIGAMAX5 (MISCELLANEOUS) ×1
BAG COUNTER SPONGE SURGICOUNT (BAG) IMPLANT
CABLE HIGH FREQUENCY MONO STRZ (ELECTRODE) ×1 IMPLANT
CATH REDDICK CHOLANGI 4FR 50CM (CATHETERS) ×1 IMPLANT
CHLORAPREP W/TINT 26 (MISCELLANEOUS) ×1 IMPLANT
CLIP APPLIE 5 13 M/L LIGAMAX5 (MISCELLANEOUS) ×1 IMPLANT
COVER MAYO STAND XLG (MISCELLANEOUS) ×1 IMPLANT
DERMABOND ADVANCED .7 DNX12 (GAUZE/BANDAGES/DRESSINGS) ×1 IMPLANT
DRAPE C-ARM 42X120 X-RAY (DRAPES) ×1 IMPLANT
ELECT REM PT RETURN 15FT ADLT (MISCELLANEOUS) ×1 IMPLANT
GLOVE BIO SURGEON STRL SZ7.5 (GLOVE) ×1 IMPLANT
GOWN STRL REUS W/ TWL LRG LVL3 (GOWN DISPOSABLE) IMPLANT
HEMOSTAT SNOW SURGICEL 2X4 (HEMOSTASIS) IMPLANT
IRRIG SUCT STRYKERFLOW 2 WTIP (MISCELLANEOUS) ×1
IRRIGATION SUCT STRKRFLW 2 WTP (MISCELLANEOUS) ×1 IMPLANT
IV CATH 14GX2 1/4 (CATHETERS) ×1 IMPLANT
KIT BASIN OR (CUSTOM PROCEDURE TRAY) ×1 IMPLANT
KIT TURNOVER KIT A (KITS) IMPLANT
PENCIL SMOKE EVACUATOR (MISCELLANEOUS) IMPLANT
SCISSORS LAP 5X35 DISP (ENDOMECHANICALS) ×1 IMPLANT
SET TUBE SMOKE EVAC HIGH FLOW (TUBING) ×1 IMPLANT
SLEEVE Z-THREAD 5X100MM (TROCAR) ×2 IMPLANT
SPIKE FLUID TRANSFER (MISCELLANEOUS) ×1 IMPLANT
SUT MNCRL AB 4-0 PS2 18 (SUTURE) ×1 IMPLANT
SUT VICRYL 0 UR6 27IN ABS (SUTURE) IMPLANT
SYS BAG RETRIEVAL 10MM (BASKET) ×1
SYSTEM BAG RETRIEVAL 10MM (BASKET) ×1 IMPLANT
TOWEL OR 17X26 10 PK STRL BLUE (TOWEL DISPOSABLE) ×1 IMPLANT
TOWEL OR NON WOVEN STRL DISP B (DISPOSABLE) ×1 IMPLANT
TRAY LAPAROSCOPIC (CUSTOM PROCEDURE TRAY) ×1 IMPLANT
TROCAR BALLN 12MMX100 BLUNT (TROCAR) ×1 IMPLANT
TROCAR Z-THREAD OPTICAL 5X100M (TROCAR) ×1 IMPLANT

## 2023-12-06 NOTE — Anesthesia Procedure Notes (Signed)
Procedure Name: Intubation Date/Time: 12/06/2023 8:20 AM  Performed by: Randa Evens, CRNAPre-anesthesia Checklist: Patient identified, Emergency Drugs available, Suction available and Patient being monitored Patient Re-evaluated:Patient Re-evaluated prior to induction Oxygen Delivery Method: Circle System Utilized Preoxygenation: Pre-oxygenation with 100% oxygen Induction Type: IV induction Ventilation: Mask ventilation without difficulty Laryngoscope Size: Mac and 3 Grade View: Grade II Tube type: Oral Tube size: 7.0 mm Number of attempts: 1 Airway Equipment and Method: Stylet and Oral airway Placement Confirmation: ETT inserted through vocal cords under direct vision, positive ETCO2 and breath sounds checked- equal and bilateral Secured at: 21 cm Tube secured with: Tape Dental Injury: Teeth and Oropharynx as per pre-operative assessment

## 2023-12-06 NOTE — H&P (Signed)
MRN: [id number] DOB: 1951-07-08 Subjective   Chief Complaint: New Consultation  History of Present Illness: Jean Shaffer is a 72 y.o. female who is seen today as an office consultation for evaluation of New Consultation  We are asked to see the patient in consultation by Dr. Delbert Harness to evaluate her for gallstones. The patient is a 72 year old white female who recently had a severe pain in her right upper quadrant. The pain radiated into her back. The pain was bad enough that sent her to the emergency department. She did have some nausea associated with it but no vomiting. She was evaluated with ultrasound and CT scan and found to have gallstones. There was no ductal dilatation or thickening of the gallbladder wall. Her liver functions were normal. She quit smoking a long time ago. She does have some underlying COPD and interstitial lung disease as well as hypertension.  Review of Systems: A complete review of systems was obtained from the patient. I have reviewed this information and discussed as appropriate with the patient. See HPI as well for other ROS.  ROS   Medical History: Past Medical History:  Diagnosis Date  Anemia  Asthma, unspecified asthma severity, unspecified whether complicated, unspecified whether persistent (HHS-HCC)  COPD (chronic obstructive pulmonary disease) (CMS/HHS-HCC)  Hypertension  Thyroid disease   Patient Active Problem List  Diagnosis  Calculus of gallbladder without cholecystitis without obstruction   Past Surgical History:  Procedure Laterality Date  APPENDECTOMY  Back and Neck surgery  HYSTERECTOMY  Knee surgery    No Known Allergies  Current Outpatient Medications on File Prior to Visit  Medication Sig Dispense Refill  albuterol MDI, PROVENTIL, VENTOLIN, PROAIR, HFA 90 mcg/actuation inhaler Inhale 2 inhalations into the lungs every 4 (four) hours as needed  amLODIPine (NORVASC) 5 MG tablet Take 1 tablet by mouth once daily  INCRUSE  ELLIPTA 62.5 mcg/actuation DsDv inhalation unit Inhale 1 Puff into the lungs once daily  levothyroxine (SYNTHROID) 50 MCG tablet Take 50 mcg by mouth once daily  ondansetron (ZOFRAN-ODT) 4 MG disintegrating tablet TAKE 1 TABLET BY MOUTH EVERY 4 HOURS AS NEEDED FOR NAUSEA AND VOMITING  oxyCODONE-acetaminophen (PERCOCET) 5-325 mg tablet Take 1 tablet by mouth every 6 (six) hours as needed  sennosides (SENOKOT) 8.6 mg tablet Take 1 tablet by mouth once daily  terbinafine HCL (LAMISIL) 250 mg tablet Take 250 mg by mouth  traZODone (DESYREL) 100 MG tablet Take 1 tablet by mouth at bedtime as needed  valACYclovir (VALTREX) 500 MG tablet Take one tablet twice a day for 3 days at the first sign of flare   No current facility-administered medications on file prior to visit.   Family History  Problem Relation Age of Onset  High blood pressure (Hypertension) Father  Hyperlipidemia (Elevated cholesterol) Father  Coronary Artery Disease (Blocked arteries around heart) Father    Social History   Tobacco Use  Smoking Status Former  Types: Cigarettes  Start date: 2015  Smokeless Tobacco Never    Social History   Socioeconomic History  Marital status: Unknown  Tobacco Use  Smoking status: Former  Types: Cigarettes  Start date: 2015  Smokeless tobacco: Never  Substance and Sexual Activity  Alcohol use: Never  Drug use: Never   Social Drivers of Corporate investment banker Strain: High Risk (05/02/2023)  Received from [facility]  Overall Financial Resource Strain (CARDIA)  Difficulty of Paying Living Expenses: Hard  Food Insecurity: Food Insecurity Present (05/02/2023)  Received from [facility]  Hunger Vital  Sign  Worried About Programme researcher, broadcasting/film/video in the Last Year: Sometimes true  Ran Out of Food in the Last Year: Never true  Transportation Needs: No Transportation Needs (05/02/2023)  [facility] - Transportation  Lack of Transportation (Medical): No  Lack  of Transportation (Non-Medical): No  Physical Activity: Sufficiently Active (05/02/2023)  [facility] from [facility]  Exercise Vital Sign  Days of Exercise per Week: 5 days  Minutes of Exercise per Session: 60 min  Stress: Stress Concern Present (05/02/2023)  Received from [facility] - Occupational Stress Questionnaire  Feeling of Stress : Very much  Social Connections: Moderately Integrated (05/02/2023)  Received from [facility]  Social Network  How would you rate your social network (family, work, friends)?: Adequate participation with social networks  Housing Stability: Unknown (05/02/2023)  Received from [facility] Stability Vital Sign  Unable to Pay for Housing in the Last Year: No  Unstable Housing in the Last Year: No   Objective:   Vitals:  BP: (!) 142/80  Pulse: 87  Temp: 36.4 C (97.6 F)  SpO2: 96%  Weight: 68 kg (150 lb)  Height: 163.8 cm (5' 4.5")  PainSc: 5  PainLoc: Abdomen   Body mass index is 25.35 kg/m.  Physical Exam Vitals reviewed.  Constitutional:  General: She is not in acute distress. Appearance: Normal appearance.  HENT:  Head: Normocephalic and atraumatic.  Right Ear: External ear normal.  Left Ear: External ear normal.  Nose: Nose normal.  Mouth/Throat:  Mouth: Mucous membranes are moist.  Pharynx: Oropharynx is clear.  Eyes:  General: No scleral icterus. Extraocular Movements: Extraocular movements intact.  Conjunctiva/sclera: Conjunctivae normal.  Pupils: Pupils are equal, round, and reactive to light.  Cardiovascular:  Rate and Rhythm: Normal rate and regular rhythm.  Pulses: Normal pulses.  Heart sounds: Normal heart sounds.  Pulmonary:  Effort: Pulmonary effort is normal. No respiratory distress.  Breath sounds: Normal breath sounds.  Abdominal:  General: Bowel sounds are normal.  Palpations: Abdomen is soft.  Tenderness: There is no abdominal tenderness.  Comments: The  abdomen is soft with mild right upper quadrant tenderness. There is no palpable mass.  Musculoskeletal:  General: No swelling, tenderness or deformity. Normal range of motion.  Cervical back: Normal range of motion and neck supple.  Skin: General: Skin is warm and dry.  Coloration: Skin is not jaundiced.  Neurological:  General: No focal deficit present.  Mental Status: She is alert and oriented to person, place, and time.  Psychiatric:  Mood and Affect: Mood normal.  Behavior: Behavior normal.     Labs, Imaging and Diagnostic Testing:  Assessment and Plan:   Diagnoses and all orders for this visit:  Calculus of gallbladder without cholecystitis without obstruction   The patient appears to have symptomatic gallstones. Because of the risk of further painful episodes and possible pancreatitis I feel she would benefit from having her gallbladder removed. She would also like to have this done. I have discussed with her in detail the risks and benefits of the operation as well as some of the technical aspects including the risk of common bile duct injury and she understands and wishes to proceed. We will plan for a laparoscopic cholecystectomy with intraoperative cholangiogram

## 2023-12-06 NOTE — Transfer of Care (Signed)
Immediate Anesthesia Transfer of Care Note  Patient: Jean Shaffer  Procedure(s) Performed: LAPAROSCOPIC CHOLECYSTECTOMY  Patient Location: PACU  Anesthesia Type:General  Level of Consciousness: sedated  Airway & Oxygen Therapy: Patient Spontanous Breathing and Patient connected to face mask  Post-op Assessment: Report given to RN  Post vital signs: Reviewed and stable  Last Vitals:  Vitals Value Taken Time  BP 144/58 12/06/23 0936  Temp    Pulse 68 12/06/23 0939  Resp 15 12/06/23 0939  SpO2 99 % 12/06/23 0939  Vitals shown include unfiled device data.  Last Pain:  Vitals:   12/06/23 0649  TempSrc:   PainSc: 0-No pain         Complications: No notable events documented.

## 2023-12-06 NOTE — Anesthesia Postprocedure Evaluation (Signed)
Anesthesia Post Note  Patient: Jean Shaffer  Procedure(s) Performed: LAPAROSCOPIC CHOLECYSTECTOMY     Patient location during evaluation: PACU Anesthesia Type: General Level of consciousness: awake and alert Pain management: pain level controlled Vital Signs Assessment: post-procedure vital signs reviewed and stable Respiratory status: spontaneous breathing, nonlabored ventilation, respiratory function stable and patient connected to nasal cannula oxygen Cardiovascular status: blood pressure returned to baseline and stable Anesthetic complications: no   No notable events documented.  Last Vitals:  Vitals:   12/06/23 1045 12/06/23 1104  BP: (!) 138/55 136/64  Pulse: 60 62  Resp: 16 14  Temp:  36.6 C  SpO2: 96% 94%    Last Pain:  Vitals:   12/06/23 1104  TempSrc:   PainSc: 4                  Collene Schlichter

## 2023-12-06 NOTE — Op Note (Signed)
12/06/2023  9:24 AM  PATIENT:  Jean Shaffer  72 y.o. female  PRE-OPERATIVE DIAGNOSIS:  GALLSTONES  POST-OPERATIVE DIAGNOSIS:  GALLSTONES  PROCEDURE:  Procedure(s): LAPAROSCOPIC CHOLECYSTECTOMY (N/A)  SURGEON:  Surgeons and Role:    * Griselda Miner, MD - Primary  PHYSICIAN ASSISTANT:   ASSISTANTS: none   ANESTHESIA:   local and general  EBL:  minimal   BLOOD ADMINISTERED:none  DRAINS: none   LOCAL MEDICATIONS USED:  MARCAINE     SPECIMEN:  Source of Specimen:  gallbladder  DISPOSITION OF SPECIMEN:  PATHOLOGY  COUNTS:  YES  TOURNIQUET:  * No tourniquets in log *  DICTATION: .Dragon Dictation    Procedure: After informed consent was obtained the patient was brought to the operating room and placed in the supine position on the operating room table. After adequate induction of general anesthesia the patient's abdomen was prepped with ChloraPrep allowed to dry and draped in usual sterile manner. An appropriate timeout was performed. The area below the umbilicus was infiltrated with quarter percent  Marcaine. A small incision was made with a 15 blade knife. The incision was carried down through the subcutaneous tissue bluntly with a hemostat and [employer] retractors. The linea alba was identified. The linea alba was incised with a 15 blade knife and each side was grasped with Coker clamps. The preperitoneal space was then probed with a hemostat until the peritoneum was opened and access was gained to the abdominal cavity. A 0 Vicryl pursestring stitch was placed in the fascia surrounding the opening. A Hassan cannula was then placed through the opening and anchored in place with the previously placed Vicryl purse string stitch. The abdomen was insufflated with carbon dioxide without difficulty. A laparoscope was inserted through the [facility] cannula in the right upper quadrant was inspected. Next the epigastric region was infiltrated with % Marcaine. A small incision was made  with a 15 blade knife. A 5 mm port was placed bluntly through this incision into the abdominal cavity under direct vision. Next 2 sites were chosen laterally on the right side of the abdomen for placement of 5 mm ports. Each of these areas was infiltrated with quarter percent Marcaine. Small stab incisions were made with a 15 blade knife. 5 mm ports were then placed bluntly through these incisions into the abdominal cavity under direct vision without difficulty. A blunt grasper was placed through the lateralmost 5 mm port and used to grasp the dome of the gallbladder and elevate it anteriorly and superiorly. Another blunt grasper was placed through the other 5 mm port and used to retract the body and neck of the gallbladder. A dissector was placed through the epigastric port and using the electrocautery the peritoneal reflection at the gallbladder neck was opened. Blunt dissection was then carried out in this area until the gallbladder neck-cystic duct junction was readily identified and a good critical window was created.  The patient's liver functions were normal so we did not do a cholangiogram.  3 clips were placed proximally on the cystic duct and the duct was divided between the 2 sets of clips. Posterior to this the cystic artery was identified and again dissected bluntly in a circumferential manner until a good window  was created. 2 clips were placed proximally and one distally on the artery and the artery was divided between the 2 sets of clips.  When dividing the artery I did notice what look like mucosa of the gallbladder.  After the gallbladder was removed  I inspected the gallbladder and there was a second hole on the wall of the gallbladder that was sitting next to the artery.  Next a laparoscopic hook cautery device was used to separate the gallbladder from the liver bed. Prior to completely detaching the gallbladder from the liver bed the liver bed was inspected and several small bleeding points were  coagulated with the electrocautery until the area was completely hemostatic. The gallbladder was then detached the rest of it from the liver bed without difficulty. A laparoscopic bag was inserted through the hassan port. The laparoscope was moved to the epigastric port. The gallbladder was placed within the bag and the bag was sealed.  The bag with the gallbladder was then removed with the [facility] cannula through the infraumbilical port without difficulty. The fascial defect was then closed with the previously placed Vicryl pursestring stitch as well as with another figure-of-eight 0 Vicryl stitch. The liver bed was inspected again and found to be hemostatic. The abdomen was irrigated with copious amounts of saline until the effluent was clear. The ports were then removed under direct vision without difficulty and were found to be hemostatic. The gas was allowed to escape. No other abnormalities were noted on general inspection of the abdomen. The skin incisions were all closed with interrupted 4-0 Monocryl subcuticular stitches. Dermabond dressings were applied. The patient tolerated the procedure well. At the end of the case all needle sponge and instrument counts were correct. The patient was then awakened and taken to recovery in stable condition  PLAN OF CARE: Discharge to home after PACU  PATIENT DISPOSITION:  PACU - hemodynamically stable.   Delay start of Pharmacological VTE agent (>24hrs) due to surgical blood loss or risk of bleeding: not applicable

## 2023-12-07 ENCOUNTER — Encounter (HOSPITAL_COMMUNITY): Payer: Self-pay | Admitting: General Surgery

## 2023-12-07 LAB — SURGICAL PATHOLOGY

## 2023-12-10 NOTE — Progress Notes (Signed)
Nodules are stable. There is covid ild changes. Would set up in ILD clinic to see MR. He needs a nodule ct follow up in 6 months  Thanks,  BLI  Josephine Igo, DO [facility] 12/10/2023 1:39 PM

## 2023-12-21 ENCOUNTER — Other Ambulatory Visit: Payer: Medicare Other

## 2024-02-15 ENCOUNTER — Ambulatory Visit (INDEPENDENT_AMBULATORY_CARE_PROVIDER_SITE_OTHER): Payer: Medicare Other | Admitting: Podiatrist

## 2024-02-15 DIAGNOSIS — B351 Tinea unguium: Secondary | ICD-10-CM

## 2024-02-15 NOTE — Progress Notes (Signed)
Patient presents today for the 3rd laser treatment. Diagnosed with mycotic nail infection by Dr. Allena Katz.   Toenail most affected hallux left. Patient is happy with progress and relates the nail fungus appears to be gone- after taking lamisil and doing laser.   All other systems are negative.   Laser therapy was administered to 1-5 toenails bilateral  and patient tolerated the treatment well. All safety precautions were in place.   Patient has completed oral terbinafine x 90 days.   Single laser pass was done on unaffected nails.   Follow up as needed in the future.

## 2024-03-27 ENCOUNTER — Ambulatory Visit: Payer: Medicare Other | Admitting: Internal Medicine

## 2024-03-27 ENCOUNTER — Encounter: Payer: Self-pay | Admitting: Internal Medicine

## 2024-03-27 VITALS — BP 132/60 | HR 75 | Ht 61.0 in | Wt 148.2 lb

## 2024-03-27 DIAGNOSIS — R0609 Other forms of dyspnea: Secondary | ICD-10-CM

## 2024-03-27 DIAGNOSIS — Z8616 Personal history of COVID-19: Secondary | ICD-10-CM | POA: Diagnosis not present

## 2024-03-27 DIAGNOSIS — R911 Solitary pulmonary nodule: Secondary | ICD-10-CM | POA: Diagnosis not present

## 2024-03-27 DIAGNOSIS — J439 Emphysema, unspecified: Secondary | ICD-10-CM

## 2024-03-27 DIAGNOSIS — Z87891 Personal history of nicotine dependence: Secondary | ICD-10-CM | POA: Diagnosis not present

## 2024-03-27 DIAGNOSIS — J8410 Pulmonary fibrosis, unspecified: Secondary | ICD-10-CM

## 2024-03-27 MED ORDER — ALBUTEROL SULFATE HFA 108 (90 BASE) MCG/ACT IN AERS: 2.0000 | INHALATION_SPRAY | Freq: Four times a day (QID) | RESPIRATORY_TRACT | 2 refills | Status: AC | PRN

## 2024-03-27 NOTE — Progress Notes (Signed)
 nopsis: Referred in February 2022 for COPD evaluation, PCP: By Macy Mis, MD  Subjective:   PATIENT ID: Jean Shaffer GENDER: female DOB: [birth year], MRN: [id number]  Chief Complaint  Patient presents with   Follow-up    Getting over bronchitis     This is a 73 year old female with a past medical history of COPD diagnosed in 2014, hypertension, hypothyroidism,?  TIA, asthma.  Patient is a former smoker quit in 2000, smoking less than half pack a day..  Patient presents to primary care office on 12/27/2020, office note reviewed by Dr. Earnest Bailey today in the office.  Had a prolonged hospitalization and rehabilitation following Covid 19 recovery.  Recently discontinued home physical therapy monitoring O2 sats remained in the low 90s.  Referred for evaluation by pulmonary regarding COVID-19 diagnosis and COPD diagnosis.  Follow-up also in the office on 12/31/2020 by Dr. Alice Reichert at physical medicine rehabilitation.  At this time still struggling with poor endurance and debility.  But she seems to be slowly recovering.  Counseled at that time on vaccine for COVID-19.  Patient was admitted to the hospital on 09/30/2020.  Prior to admission did receive monoclonal antibody infusion which was given at [facility].  Documentation of this timeline of events was reviewed today in the patient's electronic medical record.  OV 01/26/2021: Here today for evaluation. Since last office visit with primary care patient has been doing much better. She is breathing much better. She feels like she is really turned a corner. After nearly spending 40 days in the hospital related to COVID-19. She states that she did have office spirometry many years ago was diagnosed with COPD. Not on any medications at this time. No recent full PFTs. Chest x-rays from hospitalization reviewed today in the office. Still using 1 L nasal cannula nightly with sleep. She monitors her O2 sats every morning which have been above 90%.  OV  03/23/2021: Here today for follow-up after recent pulmonary function test completed prior to office visit.  Reviewed with patient today in the office.  Test revealed a FVC of 77%, FEV1 of 88% predicted, ratio of 87, TLC 65%, DLCO of 60%.  She does have some mild evidence of centrilobular emphysema.  She has predominant interstitial changes from her Covid diagnosis.  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  She also has a moderate reduced DLCO.  From a respiratory standpoint she is doing much better.  She is able to complete her activities of daily living.  She does have some shortness of breath with significant exertion but she has tried to return to most of her normal life activities.  Denies fever chills night sweats weight loss hemoptysis.  OV 11/27/2022: Here today for follow-up.  She has been doing really well since we last saw each other.  She did have some interstitial changes related to COVID.  She does have pulmonary function test with a mixed restrictive and obstructive defect.  She is doing well on Incruse.  She is a former smoker quit several years ago.  She is feeling like she is able to do most of her activities of daily living.  Working with her and spending time with her grandchildren.  She did have a recent bout of bronchitis that she has recovered from.     OV 03/27/2024 -former Dr. Elige Radon [facility] patient transferring care because he is no longer in the practice.  Subjective:  Patient ID: Jean Shaffer, female ,  DOB: 1951-01-05 , age 73 y.o. , MRN: [id number] , ADDRESS: [address] [place] [address] PCP Macy Mis, MD Patient Care Team: Macy Mis, MD as PCP - General (Family Medicine)  This Provider for this visit: Treatment Team:  Attending Provider: Kalman Shan, MD    03/27/2024 -   Chief Complaint  Patient presents with   Follow-up    Former [facility] pt. She c/o DOE with walking up stairs or "prolonged  activity"-Incruse has helped.    , And emphysema with post-COVID interstitial lung disease  HPI Jean Shaffer 73 y.o. -she is here for follow-up after seeing Dr. Teresita Madura over a year ago.  She tells me in the last 1 year she has had her gallbladder removed.  Overall she says she has dyspnea on exertion for climbing stairs.  It is improved by albuterol and also Incruse.  She needs a refill on the albuterol.  She says gradually she is getting worse she is more dyspneic at the same level of stairs.  However there is no exacerbations.  No chest pains no oxygen use no dizziness no syncope.  No interim stroke no interim cancer no interim MRI.  She did have a CT scan of the chest in December 2024 she has a left upper lobe nodule 6 mm in the short-term follow-up is recommended.  She is worried about the cancer risk.  We discussed this.  Last echocardiogram 2019 with grade 1 diastolic dysfunction.  Recent blood work December 2024 no anemia  Symptom score and exercise hypoxemia test listed below.   SYMPTOM SCALE - ILD 03/27/2024  Current weight   O2 use ra  Shortness of Breath 0 -> 5 scale with 5 being worst (score 6 If unable to do)  At rest 0  Simple tasks - showers, clothes change, eating, shaving 1  Household (dishes, doing bed, laundry) 1  Shopping 0  Walking level at own pace 0.5  Walking up Stairs 3.5  Total (30-36) Dyspnea Score 6  How bad is your cough? 0  How bad is your fatigue 4.5  How bad is nausea 0  How bad is vomiting?  00  How bad is diarrhea? 0  How bad is anxiety? 0  How bad is depression 0  Any chronic pain - if so where and how bad 0     Simple office walk 224 (66+46 x 2) feet Pod A at [facility] x  3 laps goal with forehead probe 03/27/2024    O2 used Room air   Number laps completed Stand x 15   Comments about pace    Resting Pulse Ox/HR 98% and 72/min   Final Pulse Ox/HR 98% and 86/min   Desaturated </= 88% no   Desaturated <= 3% points no   Got Tachycardic  >/= 90/min no   Symptoms at end of test Dyspnea +   Miscellaneous comments x        Lungs/Pleura: Coarsened parenchymal and subpleural ground-glass with linear densities and traction bronchiectasis, somewhat basilar predominant. 6 mm anterior segment left upper lobe nodule (5/48). 9 mm atypical pulmonary cyst with posterior wall thickening, also in the anterior segment left upper lobe (5/45). No pleural fluid. Mild air trapping.    IMPRESSION: 1. 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 2. Left upper lobe nodules, as detailed above. 3. Follow-up CT chest without contrast in 3-6 months as malignancy cannot be excluded. This recommendation follows the  consensus statement: Guidelines for Management of Small Pulmonary Nodules Detected on CT Images: From the Fleischner Society 2017; Radiology 2017; 284:228-243. 4. Left adrenal adenoma. 5. Aortic atherosclerosis (ICD10-I70.0). Coronary artery calcification.     Electronically Signed   By: Leanna Battles M.D.   On: 12/10/2023 10:07 PFT     Latest Ref Rng & Units 03/23/2021   10:04 AM  PFT Results  FVC-Pre L 2.24   FVC-Predicted Pre % 74   FVC-Post L 2.32   FVC-Predicted Post % 77   Pre FEV1/FVC % % 85   Post FEV1/FCV % % 87   FEV1-Pre L 1.92   FEV1-Predicted Pre % 84   FEV1-Post L 2.01   DLCO uncorrected ml/min/mmHg 11.75   DLCO UNC% % 60   DLCO corrected ml/min/mmHg 11.75   DLCO COR %Predicted % 60   DLVA Predicted % 90   TLC L 3.31   TLC % Predicted % 65   RV % Predicted % 46        LAB RESULTS last 96 hours No results found.       has a past medical history of Anemia, Arthritis, Asthma, Chronic back pain, Chronic kidney disease, COPD (chronic obstructive pulmonary disease) (HCC), Depression, GERD (gastroesophageal reflux disease), Heart murmur, Hypertension, Hypothyroidism, Neuromuscular disorder (HCC), Pneumonia, Stroke (HCC), and  Thyroid disease.   reports that she has quit smoking. Her smoking use included cigarettes. She has a 10 pack-year smoking history. She has never used smokeless tobacco.  Past Surgical History:  Procedure Laterality Date   ABDOMINAL HYSTERECTOMY     CATARACT EXTRACTION W/ INTRAOCULAR LENS IMPLANT Left 07/14/2014   CATARACT EXTRACTION W/ INTRAOCULAR LENS IMPLANT Right 10/18/2015   CHOLECYSTECTOMY N/A 12/06/2023   Procedure: LAPAROSCOPIC CHOLECYSTECTOMY;  Surgeon: Griselda Miner, MD;  Location: [facility];  Service: General;  Laterality: N/A;   COLONOSCOPY     IR RADIOLOGIST EVAL & MGMT  01/08/2020   LAPAROSCOPIC APPENDECTOMY N/A 01/29/2020   Procedure: LAPAROSCOPIC  APPENDECTOMY;  Surgeon: Emelia Loron, MD;  Location: [facility] OR;  Service: General;  Laterality: N/A;   MULTIPLE TOOTH EXTRACTIONS  2002   NECK SURGERY  2001   ACDF C4-5, C5-6   by Dr. Tamsen Roers SURGERY  11/13/2007   PLIF  L4-5, L5-S1   by Dr. Robyne Peers TOOTH EXTRACTION     per pt 01/21/20    No Known Allergies  Immunization History  Administered Date(s) Administered   Fluad Quad(high Dose 65+) 11/22/2016, 11/23/2017   Influenza Split 12/25/2006, 11/08/2010, 01/20/2013   Influenza, High Dose Seasonal PF 12/08/2018   Influenza, Quadrivalent, Recombinant, Inj, Pf 11/27/2013   Influenza, Seasonal, Injecte, Preservative Fre 10/23/2014, 11/15/2015   Influenza,inj,Quad PF,6+ Mos 11/22/2016, 11/23/2017   Pneumococcal Conjugate-13 02/15/2016   Pneumococcal Polysaccharide-23 05/22/2017   Pneumococcal-Unspecified 12/25/2006   Td 12/26/2007    Family History  Problem Relation Age of Onset   Alcohol abuse Father    Cancer Father    Heart disease Father    Cancer Mother    Cancer Maternal Grandfather    Heart disease Maternal Grandfather    Heart disease Paternal Grandmother    Diabetes Paternal Grandmother    Diabetes Paternal Grandfather      Current Outpatient Medications:    acetaminophen (TYLENOL) 325 MG  tablet, Take 1-2 tablets (325-650 mg total) by mouth every 4 (four) hours as needed for mild pain. (Patient taking differently: Take 650 mg by mouth every 4 (four) hours as needed for  mild pain (pain score 1-3).), Disp: , Rfl:    albuterol (VENTOLIN HFA) 108 (90 Base) MCG/ACT inhaler, Inhale 2 puffs into the lungs every 6 (six) hours as needed for shortness of breath or wheezing., Disp: , Rfl:    amLODipine (NORVASC) 5 MG tablet, Take 5 mg by mouth daily., Disp: , Rfl:    buPROPion (WELLBUTRIN XL) 150 MG 24 hr tablet, Take 150 mg by mouth daily. , Disp: , Rfl:    ibuprofen (ADVIL) 200 MG tablet, Take 400 mg by mouth every 6 (six) hours as needed for moderate pain (pain score 4-6)., Disp: , Rfl:    levothyroxine (SYNTHROID, LEVOTHROID) 50 MCG tablet, Take 50 mcg by mouth daily before breakfast. , Disp: , Rfl:    senna (SENOKOT) 8.6 MG TABS tablet, Take 2 tablets by mouth at bedtime., Disp: , Rfl:    traZODone (DESYREL) 100 MG tablet, Take 100 mg by mouth at bedtime as needed for sleep. , Disp: , Rfl:    umeclidinium bromide (INCRUSE ELLIPTA) 62.5 MCG/INH AEPB, Inhale 1 puff into the lungs daily., Disp: 1 each, Rfl: 0   valACYclovir (VALTREX) 500 MG tablet, Take 500 mg by mouth See admin instructions. Take 1 tablet by mouth twice daily for 3 days during outbreak, Disp: , Rfl:       Objective:   Vitals:   03/27/24 0852 03/27/24 0854  BP: 132/60   Pulse: 75   SpO2: 96%   Weight:  148 lb 3.2 oz (67.2 kg)  Height:  5\' 1"  (1.549 m)    Estimated body mass index is 28 kg/m as calculated from the following:   Height as of this encounter: 5\' 1"  (1.549 m).   Weight as of this encounter: 148 lb 3.2 oz (67.2 kg).  @WEIGHTCHANGE @  [employer]   03/27/24 0854  Weight: 148 lb 3.2 oz (67.2 kg)     Physical Exam   General: No distress. Looks well O2 at rest: no Cane present: no Sitting in wheel chair: no Frail: no Obese: no Neuro: Alert and Oriented x 3. GCS 15. Speech normal Psych:  Pleasant Resp:  Barrel Chest - no.  Wheeze - no, Crackles - no, No overt respiratory distress CVS: Normal heart sounds. Murmurs - no Ext: Stigmata of Connective Tissue Disease - no HEENT: Normal upper airway. PEERL +. No post nasal drip        Assessment:       ICD-10-CM   1. Left upper lobe pulmonary nodule  R91.1 CT Chest High Resolution    Pulmonary function test    ECHOCARDIOGRAM COMPLETE    2. DOE (dyspnea on exertion)  R06.09 CT Chest High Resolution    Pulmonary function test    ECHOCARDIOGRAM COMPLETE    3. History of COVID-19  Z86.16 CT Chest High Resolution    Pulmonary function test    ECHOCARDIOGRAM COMPLETE    4. 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 Left upper lobe pulmonary nodule  R91.1     2. DOE (dyspnea on exertion)  R06.09     3. History of COVID-19  Z86.16  4. 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  We need to rule out any worsening interstitial lung disease post-COVID or any cardiac issues such as stiff heart muscle that could have emerged  In addition the left upper lobe 6 mm nodule from December 2024 needs for the follow-up  PLAN - High-resolution CT chest supine and prone in 2 months - Full pulmonary function test in 2 months - Echocardiogram anytime in the next 2 months  Follow-up - 15-minute visit in 10 weeks to discuss test results   FOLLOWUP Return in about 10 weeks (around 06/05/2024) for 15 min visit, with Dr Marchelle Gearing, Face to Face Visit to discuss test results.    SIGNATURE    Dr.  Kalman Shan, M.D., F.C.C.P,  Pulmonary and Critical Care Medicine Staff Physician, [facility] Director - Interstitial Lung Disease  Program  Pulmonary Fibrosis [facility] at [facility] [place], [place], [address]  Pager: [phone], If no answer or between  15:00h - 7:00h: call [phone] Telephone: [phone]  9:39 AM 03/27/2024

## 2024-03-27 NOTE — Addendum Note (Signed)
 Addended by: Christen Butter on: 03/27/2024 09:56 AM   Modules accepted: Orders

## 2024-03-27 NOTE — Patient Instructions (Signed)
 ICD-10-CM   1. Left upper lobe pulmonary nodule  R91.1     2. DOE (dyspnea on exertion)  R06.09     3. History of COVID-19  Z86.16     4. 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  We need to rule out any worsening interstitial lung disease post-COVID or any cardiac issues such as stiff heart muscle that could have emerged  In addition the left upper lobe 6 mm nodule from December 2024 needs for the follow-up  PLAN - High-resolution CT chest supine and prone in 2 months - Full pulmonary function test in 2 months - Echocardiogram anytime in the next 2 months  Follow-up - 15-minute visit in 10 weeks to discuss test results

## 2024-04-08 ENCOUNTER — Ambulatory Visit (INDEPENDENT_AMBULATORY_CARE_PROVIDER_SITE_OTHER)

## 2024-04-08 DIAGNOSIS — J439 Emphysema, unspecified: Secondary | ICD-10-CM

## 2024-04-08 DIAGNOSIS — R911 Solitary pulmonary nodule: Secondary | ICD-10-CM

## 2024-04-08 DIAGNOSIS — Z8616 Personal history of COVID-19: Secondary | ICD-10-CM | POA: Diagnosis not present

## 2024-04-08 DIAGNOSIS — R0609 Other forms of dyspnea: Secondary | ICD-10-CM | POA: Diagnosis not present

## 2024-04-08 DIAGNOSIS — J8410 Pulmonary fibrosis, unspecified: Secondary | ICD-10-CM

## 2024-04-08 DIAGNOSIS — Z87891 Personal history of nicotine dependence: Secondary | ICD-10-CM

## 2024-04-08 LAB — ECHOCARDIOGRAM COMPLETE
Area-P 1/2: 3.85 cm2
MV M vel: 3.85 m/s
MV Peak grad: 59.3 mmHg
S' Lateral: 2.8 cm

## 2024-04-14 ENCOUNTER — Ambulatory Visit
Admission: RE | Admit: 2024-04-14 | Discharge: 2024-04-14 | Disposition: A | Discharge status: Home / Self Care | Source: Ambulatory Visit | Attending: Family Medicine | Admitting: Family Medicine

## 2024-04-14 VITALS — BP 112/70 | HR 83 | Temp 98.4°F | Resp 20 | Ht 64.0 in | Wt 148.0 lb

## 2024-04-14 DIAGNOSIS — U071 COVID-19: Secondary | ICD-10-CM

## 2024-04-14 LAB — POCT INFLUENZA A/B
Influenza A, POC: NEGATIVE
Influenza B, POC: NEGATIVE

## 2024-04-14 LAB — POC SARS CORONAVIRUS 2 AG -  ED: SARS Coronavirus 2 Ag: POSITIVE — AB

## 2024-04-14 MED ORDER — PAXLOVID (300/100) 20 X 150 MG & 10 X 100MG PO TBPK: 3.0000 | ORAL_TABLET | Freq: Two times a day (BID) | ORAL | 0 refills | Status: AC

## 2024-04-14 NOTE — Discharge Instructions (Signed)
 You were diagnosed with Covid 19 infection today.  I have sent out paxlovid  to treat this.  You may continue over the counter medications for fever, as well as for cough/congestion.   Please return if not improving as expected.

## 2024-04-14 NOTE — ED Triage Notes (Signed)
"  This started Saturday with not feeling well, I went to the park with grandchildren, when back home, fever and chills all that night, then this has continued on/off with cough/congestion, body aches, fatigue". "Highest Fever up to 101.8 yesterday, down this morning". No seasonal Flu or COVID19 vaccines.

## 2024-04-14 NOTE — ED Provider Notes (Signed)
 [facility]    CSN: [id number] Arrival date & time: 04/14/24  1017      History   Chief Complaint Chief Complaint  Patient presents with   Fever    Entered by patient    HPI Jean Shaffer is a 73 y.o. female.    Fever Associated symptoms: congestion, cough and rhinorrhea    Patient is here for not feeling well.  Started feeling poorly 2 days ago, fevers, chills, cough, congestion, body aches.  She was nauseated initially, but that resolved.  Highest temp of 101.8 yesterday.  She has been taking motrin for fever.  No wheezing, slight sob.  She just came back from a cruise and 2 of the ladies had covid while there.        Past Medical History:  Diagnosis Date   Anemia    Arthritis    osteoarthritis in knees and hands ~2016 dx   Asthma    Chronic back pain    Chronic kidney disease    COPD (chronic obstructive pulmonary disease) (HCC)    per pt dx in ~2014   Depression    GERD (gastroesophageal reflux disease)    Heart murmur    as a child per pt   Hypertension    Hypothyroidism    Neuromuscular disorder (HCC)    radiculopathy from spine to feet   Pneumonia    had long COVID with pneumonia, hospitalized x 40 days   Stroke [facility])    per pt "maybe last year or the year before last, they told me I might've had a TIA, but I blacked out"   Thyroid  disease     Patient Active Problem List   Diagnosis Date Noted   Acute thoracic back pain 08/01/2023   Constipation 08/01/2023   History of colon polyps 08/01/2023   Neck pain 08/01/2023   Shoulder joint pain 08/01/2023   Atherosclerosis 05/02/2023   Chronic kidney disease, stage 3b (HCC) 05/01/2022   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   Persistent shortness of breath after COVID-19 11/02/2020   Insomnia 11/02/2020   Anxiety reaction 11/02/2020   Sprain of ulnar collateral ligament 11/02/2020   Protein-calorie malnutrition, severe 10/27/2020   Debility 10/25/2020   Acute respiratory failure with hypoxia  (HCC)    COVID-19 09/30/2020   Elevated WBC count 06/07/2020   S/P laparoscopic appendectomy 01/29/2020   Perforated appendicitis 12/27/2019   Palpitations 02/05/2019   Dizziness 12/16/2018   Syncope 12/07/2018   HTN (hypertension) 12/07/2018   COPD (chronic obstructive pulmonary disease) (HCC) 12/07/2018   Lumbar radiculopathy 09/20/2017   At high risk for falls 05/24/2017   Decreased hearing 11/22/2016   Cataract 07/30/2015   Psoriasis 07/30/2015   Herpes simplex vulvovaginitis 01/25/2015   Chronic back pain 03/03/2014   Displacement of lumbar intervertebral disc without myelopathy 10/22/2013   Lumbar post-laminectomy syndrome 10/22/2013   Continuous opioid dependence (HCC) 08/28/2013   Hyperlipidemia 04/19/2012   GERD (gastroesophageal reflux disease) 04/16/2012   Hereditary and idiopathic peripheral neuropathy 11/09/2010   Moderate episode of recurrent major depressive disorder (HCC) 11/09/2010   Osteoarthritis 11/08/2010    Past Surgical History:  Procedure Laterality Date   ABDOMINAL HYSTERECTOMY     CATARACT EXTRACTION W/ INTRAOCULAR LENS IMPLANT Left 07/14/2014   CATARACT EXTRACTION W/ INTRAOCULAR LENS IMPLANT Right 10/18/2015   CHOLECYSTECTOMY N/A 12/06/2023   Procedure: LAPAROSCOPIC CHOLECYSTECTOMY;  Surgeon: Caralyn Chandler, MD;  Location: [facility] ORS;  Service: General;  Laterality: N/A;   COLONOSCOPY  IR RADIOLOGIST EVAL & MGMT  01/08/2020   LAPAROSCOPIC APPENDECTOMY N/A 01/29/2020   Procedure: LAPAROSCOPIC  APPENDECTOMY;  Surgeon: Enid Harry, MD;  Location: [facility] OR;  Service: General;  Laterality: N/A;   MULTIPLE TOOTH EXTRACTIONS  2002   NECK SURGERY  2001   ACDF C4-5, C5-6   by Dr. Jinny Mounts SURGERY  11/13/2007   PLIF  L4-5, L5-S1   by Dr. Jone Neither TOOTH EXTRACTION     per pt 01/21/20    OB History   No obstetric history on file.      Home Medications    Prior to Admission medications   Medication Sig Start Date End Date Taking?  Authorizing Provider  amLODipine  (NORVASC ) 5 MG tablet Take 5 mg by mouth daily.   Yes [provider]  buPROPion  (WELLBUTRIN  XL) 150 MG 24 hr tablet Take 150 mg by mouth daily.  09/11/18  Yes [provider]  levothyroxine  (SYNTHROID , LEVOTHROID) 50 MCG tablet Take 50 mcg by mouth daily before breakfast.  09/06/18  Yes [provider]  traZODone  (DESYREL ) 100 MG tablet Take 100 mg by mouth at bedtime as needed for sleep.  03/01/18  Yes [provider]  umeclidinium bromide  (INCRUSE ELLIPTA ) 62.5 MCG/INH AEPB Inhale 1 puff into the lungs daily. 10/26/20  Yes Doroteo Gasmen, MD  acetaminophen  (TYLENOL ) 325 MG tablet Take 1-2 tablets (325-650 mg total) by mouth every 4 (four) hours as needed for mild pain. Patient taking differently: Take 650 mg by mouth every 4 (four) hours as needed for mild pain (pain score 1-3). 10/26/20   Love, Renay Carota, PA-C  albuterol  (VENTOLIN  HFA) 108 (90 Base) MCG/ACT inhaler Inhale 2 puffs into the lungs every 6 (six) hours as needed for shortness of breath or wheezing. 03/27/24   Maire Scot, MD  ibuprofen (ADVIL) 200 MG tablet Take 400 mg by mouth every 6 (six) hours as needed for moderate pain (pain score 4-6).    [provider]  senna (SENOKOT) 8.6 MG TABS tablet Take 2 tablets by mouth at bedtime.    [provider]  valACYclovir (VALTREX) 500 MG tablet Take 500 mg by mouth See admin instructions. Take 1 tablet by mouth twice daily for 3 days during outbreak 06/10/18   [provider]    Family History Family History  Problem Relation Age of Onset   Cancer Mother    Alcohol abuse Father    Cancer Father    Heart disease Father    Cancer Maternal Grandfather    Heart disease Maternal Grandfather    Heart disease Paternal Grandmother    Diabetes Paternal Grandmother    Diabetes Paternal Grandfather     Social History Social History   Tobacco Use   Smoking status: Former    Current  packs/day: 1.00    Average packs/day: 1 pack/day for 10.0 years (10.0 ttl pk-yrs)    Types: Cigarettes   Smokeless tobacco: Never   Tobacco comments:    Quit in 1985  Vaping Use   Vaping status: Never Used  Substance Use Topics   Alcohol use: No   Drug use: No     Allergies   Patient has no known allergies.   Review of Systems Review of Systems  Constitutional:  Positive for fever.  HENT:  Positive for congestion and rhinorrhea.   Respiratory:  Positive for cough.   Gastrointestinal: Negative.   Musculoskeletal: Negative.   Psychiatric/Behavioral: Negative.  Physical Exam Triage Vital Signs ED Triage Vitals  Encounter Vitals Group     BP 04/14/24 1024 112/70     Systolic BP Percentile --      Diastolic BP Percentile --      Pulse Rate 04/14/24 1024 83     Resp 04/14/24 1024 20     Temp 04/14/24 1024 98.4 F (36.9 C)     Temp Source 04/14/24 1024 Oral     SpO2 04/14/24 1024 96 %     Weight 04/14/24 1022 148 lb (67.1 kg)     Height 04/14/24 1022 5\' 4"  (1.626 m)     Head Circumference --      Peak Flow --      Pain Score 04/14/24 1020 0     Pain Loc --      Pain Education --      Exclude from Growth Chart --    No data found.  Updated Vital Signs BP 112/70 (BP Location: Left Arm)   Pulse 83   Temp 98.4 F (36.9 C) (Oral)   Resp 20   Ht 5\' 4"  (1.626 m)   Wt 67.1 kg   SpO2 96%   BMI 25.40 kg/m   Visual Acuity Right Eye Distance:   Left Eye Distance:   Bilateral Distance:    Right Eye Near:   Left Eye Near:    Bilateral Near:     Physical Exam Constitutional:      General: She is not in acute distress.    Appearance: She is normal weight. She is ill-appearing.  HENT:     Nose: Congestion present. No rhinorrhea.  Cardiovascular:     Rate and Rhythm: Normal rate and regular rhythm.  Pulmonary:     Effort: Pulmonary effort is normal.     Breath sounds: Normal breath sounds. No wheezing or rhonchi.  Musculoskeletal:     Cervical back:  Normal range of motion and neck supple. No tenderness.  Lymphadenopathy:     Cervical: No cervical adenopathy.  Skin:    General: Skin is warm.  Neurological:     General: No focal deficit present.     Mental Status: She is alert.  Psychiatric:        Mood and Affect: Mood normal.      UC Treatments / Results  Labs (all labs ordered are listed, but only abnormal results are displayed) Labs Reviewed  POC SARS CORONAVIRUS 2 AG -  ED - Abnormal; Notable for the following components:      Result Value   SARS Coronavirus 2 Ag Positive (*)    All other components within normal limits  POCT INFLUENZA A/B - Normal    EKG   Radiology No results found.  Procedures Procedures (including critical care time)  Medications Ordered in UC Medications - No data to display  Initial Impression / Assessment and Plan / UC Course  I have reviewed the triage vital signs and the nursing notes.  Pertinent labs & imaging results that were available during my care of the patient were reviewed by me and considered in my medical decision making (see chart for details).   Final Clinical Impressions(s) / UC Diagnoses   Final diagnoses:  COVID-19 virus infection     Discharge Instructions      You were diagnosed with Covid 19 infection today.  I have sent out paxlovid  to treat this.  You may continue over the counter medications for fever, as well as for cough/congestion.  Please return if not improving as expected.     ED Prescriptions     Medication Sig Dispense Auth. Provider   nirmatrelvir /ritonavir  (PAXLOVID , 300/100,) 20 x 150 MG & 10 x 100MG  TBPK Take 3 tablets by mouth 2 (two) times daily for 5 days. Patient GFR is >60 Take nirmatrelvir  (150 mg) two tablets twice daily for 5 days and ritonavir  (100 mg) one tablet twice daily for 5 days. 30 tablet Lesle Ras, MD      PDMP not reviewed this encounter.   Lesle Ras, MD 04/14/24 1044

## 2024-05-26 ENCOUNTER — Ambulatory Visit (HOSPITAL_BASED_OUTPATIENT_CLINIC_OR_DEPARTMENT_OTHER)
Admission: RE | Admit: 2024-05-26 | Discharge: 2024-05-26 | Disposition: A | Discharge status: Home / Self Care | Source: Ambulatory Visit | Attending: Internal Medicine | Admitting: Internal Medicine

## 2024-05-26 DIAGNOSIS — R0609 Other forms of dyspnea: Secondary | ICD-10-CM

## 2024-05-26 DIAGNOSIS — J8410 Pulmonary fibrosis, unspecified: Secondary | ICD-10-CM | POA: Insufficient documentation

## 2024-05-26 DIAGNOSIS — Z87891 Personal history of nicotine dependence: Secondary | ICD-10-CM

## 2024-05-26 DIAGNOSIS — R911 Solitary pulmonary nodule: Secondary | ICD-10-CM

## 2024-05-26 DIAGNOSIS — J439 Emphysema, unspecified: Secondary | ICD-10-CM

## 2024-05-26 DIAGNOSIS — Z8616 Personal history of COVID-19: Secondary | ICD-10-CM | POA: Diagnosis present

## 2024-06-10 ENCOUNTER — Encounter: Payer: Self-pay | Admitting: Internal Medicine

## 2024-06-10 ENCOUNTER — Ambulatory Visit (INDEPENDENT_AMBULATORY_CARE_PROVIDER_SITE_OTHER): Admitting: Internal Medicine

## 2024-06-10 VITALS — BP 114/58 | HR 86 | Ht 64.0 in | Wt 150.0 lb

## 2024-06-10 DIAGNOSIS — Z8616 Personal history of COVID-19: Secondary | ICD-10-CM | POA: Diagnosis not present

## 2024-06-10 DIAGNOSIS — I2510 Atherosclerotic heart disease of native coronary artery without angina pectoris: Secondary | ICD-10-CM

## 2024-06-10 DIAGNOSIS — J849 Interstitial pulmonary disease, unspecified: Secondary | ICD-10-CM

## 2024-06-10 DIAGNOSIS — R911 Solitary pulmonary nodule: Secondary | ICD-10-CM | POA: Diagnosis not present

## 2024-06-10 DIAGNOSIS — I5189 Other ill-defined heart diseases: Secondary | ICD-10-CM | POA: Diagnosis not present

## 2024-06-10 NOTE — Patient Instructions (Addendum)
 ICD-10-CM   1. Left upper lobe pulmonary nodule  R91.1     2. DOE (dyspnea on exertion)  R06.09     3. History of COVID-19  Z86.16     4. Former smoker  Z87.891     5. 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    J84.10       #Left upper lobe pulmonary nodule 6 mm December 2024  -This is stable in June 2025  Plan - Repeat CT scan of the chest [high-resolution] in June 2026;    #Interstitial lung disease post-COVID   -Stable on CT scan of the chest between December 2024 and June 2025 - Minimal symptom burden - Last pulmonary function test 2022 apologize we did not get a pulmonary function test scheduled at this visit]  Plan - Do repeat pulmonary function test in 6 months - Initiate limited workup for the interstitial lung disease; get blood work for ANA, rheumatoid factor, CCP   #Grade 2 diastolic dysfunction on echocardiogram # Coronary artery calcification  -Worsening shortness of breath can definitely be because of worsening stiff heart muscle between 2019 and 2025   PLAN - Refer cardiology  Follow-up - 15-minute visit in 6 with nurse practitioner but after pulmonary function test  - Symptom score and exercise hypoxemia test at follow-up  - To order CT scan of the chest at the time of follow-up

## 2024-06-10 NOTE — Progress Notes (Signed)
 nopsis: Referred in February 2022 for COPD evaluation, PCP: By Claudell Cruz, MD  Subjective:   PATIENT ID: Jean Shaffer: female DOB: Jul 29, 1951, MRN: [id number]  Chief Complaint  Patient presents with   Follow-up    Getting over bronchitis     This is a 73 year old female with a past medical history of COPD diagnosed in 2014, hypertension, hypothyroidism,?  TIA, asthma.  Patient is a former smoker quit in 2000, smoking less than half pack a day..  Patient presents to primary care office on 12/27/2020, office note reviewed by Dr. Leamon Pringle today in the office.  Had a prolonged hospitalization and rehabilitation following Covid 19 recovery.  Recently discontinued home physical therapy monitoring O2 sats remained in the low 90s.  Referred for evaluation by pulmonary regarding COVID-19 diagnosis and COPD diagnosis.  Follow-up also in the office on 12/31/2020 by Dr. Lovvorn at physical medicine rehabilitation.  At this time still struggling with poor endurance and debility.  But she seems to be slowly recovering.  Counseled at that time on vaccine for COVID-19.  Patient was admitted to the hospital on 09/30/2020.  Prior to admission did receive monoclonal antibody infusion which was given at [facility].  Documentation of this timeline of events was reviewed today in the patient's electronic medical record.  OV 01/26/2021: Here today for evaluation. Since last office visit with primary care patient has been doing much better. She is breathing much better. She feels like she is really turned a corner. After nearly spending 40 days in the hospital related to COVID-19. She states that she did have office spirometry many years ago was diagnosed with COPD. Not on any medications at this time. No recent full PFTs. Chest x-rays from hospitalization reviewed today in the office. Still using 1 L nasal cannula nightly with sleep. She monitors her O2 sats every morning which have been above 90%.  OV  03/23/2021: Here today for follow-up after recent pulmonary function test completed prior to office visit.  Reviewed with patient today in the office.  Test revealed a FVC of 77%, FEV1 of 88% predicted, ratio of 87, TLC 65%, DLCO of 60%.  She does have some mild evidence of centrilobular emphysema.  She has predominant interstitial changes from her Covid diagnosis.  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  She also has a moderate reduced DLCO.  From a respiratory standpoint she is doing much better.  She is able to complete her activities of daily living.  She does have some shortness of breath with significant exertion but she has tried to return to most of her normal life activities.  Denies fever chills night sweats weight loss hemoptysis.  OV 11/27/2022: Here today for follow-up.  She has been doing really well since we last saw each other.  She did have some interstitial changes related to COVID.  She does have pulmonary function test with a mixed restrictive and obstructive defect.  She is doing well on Incruse.  She is a former smoker quit several years ago.  She is feeling like she is able to do most of her activities of daily living.  Working with her and spending time with her grandchildren.  She did have a recent bout of bronchitis that she has recovered from.     OV 03/27/2024 -former Dr. Dariel Edelson [facility] patient transferring care because he is no longer in the practice.  Subjective:  Patient ID: Jean Shaffer,  female , DOB: 23-Jul-1951 , age 38 y.o. , MRN: [id number] , ADDRESS: [address] [place] [address] PCP Claudell Cruz, MD Patient Care Team: Claudell Cruz, MD as PCP - General (Family Medicine)  This Provider for this visit: Treatment Team:  Attending Provider: Maire Scot, MD    03/27/2024 -   Chief Complaint  Patient presents with   Follow-up    Former [facility] pt. She c/o DOE with walking up stairs or prolonged  activity-Incruse has helped.    , And emphysema with post-COVID interstitial lung disease  HPI YULA CROTWELL 73 y.o. -she is here for follow-up after seeing Dr. Jonette Nestle over a year ago.  She tells me in the last 1 year she has had her gallbladder removed.  Overall she says she has dyspnea on exertion for climbing stairs.  It is improved by albuterol  and also Incruse.  She needs a refill on the albuterol .  She says gradually she is getting worse she is more dyspneic at the same level of stairs.  However there is no exacerbations.  No chest pains no oxygen use no dizziness no syncope.  No interim stroke no interim cancer no interim MRI.  She did have a CT scan of the chest in December 2024 she has a left upper lobe nodule 6 mm in the short-term follow-up is recommended.  She is worried about the cancer risk.  We discussed this.  Last echocardiogram 2019 with grade 1 diastolic dysfunction.  Recent blood work December 2024 no anemia  Symptom score and exercise hypoxemia test listed below.      Lungs/Pleura: Coarsened parenchymal and subpleural ground-glass with linear densities and traction bronchiectasis, somewhat basilar predominant. 6 mm anterior segment left upper lobe nodule (5/48). 9 mm atypical pulmonary cyst with posterior wall thickening, also in the anterior segment left upper lobe (5/45). No pleural fluid. Mild air trapping.    IMPRESSION: 1. 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 2. Left upper lobe nodules, as detailed above. 3. Follow-up CT chest without contrast in 3-6 months as malignancy cannot be excluded. This recommendation follows the consensus statement: Guidelines for Management of Small Pulmonary Nodules Detected on CT Images: From the Fleischner Society 2017; Radiology 2017; 284:228-243. 4. Left adrenal adenoma. 5. Aortic atherosclerosis (ICD10-I70.0). Coronary artery calcification.      Electronically Signed   By: Shearon Denis M.D.   On: 12/10/2023 10:07  OV 06/10/2024  Subjective:  Patient ID: Jean Shaffer, female , DOB: 06-19-1951 , age 72 y.o. , MRN: [id number] , ADDRESS: [address] [place] [address] PCP Claudell Cruz, MD Patient Care Team: Claudell Cruz, MD as PCP - General (Family Medicine)  This Provider for this visit: Treatment Team:  Attending Provider: Maire Scot, MD    06/10/2024 -   Chief Complaint  Patient presents with   Follow-up    Breathing is doing well overall. She has cough with clear to grey sputum in the morning. She is here to review ECHO and HRCT.      HPI AURORAH SCHLACHTER 73 y.o. -returns for follow-up.  Since her last visit other than intermittent knee pain for which she is going to get repeat injection of steroids with Dr. Lynett Sarah on the right side there are no other new medical problems no hospitalizations no ER visits no surgeries no urgent care visits no changes to the medications.  She reports stable health including shortness of  breath.  Issues   -left upper lobe nodule 6 mm December 2024: She had a repeat CT scan of the chest these lung nodules are stable  -Post COVID postinflammatory ILD changes: She has had COVID 3 times most recent one was May 2024.  She had a hospitalization several years ago.  Radiology in December to describe her ILD is post-COVID.  She had repeat CT scan of the chest high-resolution in June 2025 there is no change.  She was supposed to have repeat pulmonary function test but office did not schedule this.  Nevertheless symptom wise she is stable.  Last pulmonary function test was in 2022  -Worsening dyspnea: We did get echocardiogram that shows grade 2 diastolic dysfunction.  This is worse than the grade 1 reported in 2019 and could well be the reason for her worsening shortness of breath.  She also has coronary artery calcification on CT.  She has never had a stress test.   Not seen cardiology in a long time.  She is she has a to get a cardiology referral.   SYMPTOM SCALE - ILD 03/27/2024  Current weight   O2 use ra  Shortness of Breath 0 -> 5 scale with 5 being worst (score 6 If unable to do)  At rest 0  Simple tasks - showers, clothes change, eating, shaving 1  Household (dishes, doing bed, laundry) 1  Shopping 0  Walking level at own pace 0.5  Walking up Stairs 3.5  Total (30-36) Dyspnea Score 6  How bad is your cough? 0  How bad is your fatigue 4.5  How bad is nausea 0  How bad is vomiting?  00  How bad is diarrhea? 0  How bad is anxiety? 0  How bad is depression 0  Any chronic pain - if so where and how bad 0     Simple office walk 224 (66+46 x 2) feet Pod A at [facility] x  3 laps goal with forehead probe 03/27/2024    O2 used Room air   Number laps completed Stand x 15   Comments about pace    Resting Pulse Ox/HR 98% and 72/min   Final Pulse Ox/HR 98% and 86/min   Desaturated </= 88% no   Desaturated <= 3% points no   Got Tachycardic >/= 90/min no   Symptoms at end of test Dyspnea +   Miscellaneous comments x    CT Chest data from date: JUNE 2025  - personally visualized and independently interpreted : YES - my findings are: AGREE CLINICAL DATA:  Diffuse/interstitial lung disease. Lung nodule. Increasing shortness of breath. Former smoker. History of COVID-19 infection.   EXAM: CT CHEST WITHOUT CONTRAST   TECHNIQUE: Multidetector CT imaging of the chest was performed following the standard protocol without intravenous contrast. High resolution imaging of the lungs, as well as inspiratory and expiratory imaging, was performed.   RADIATION DOSE REDUCTION: This exam was performed according to the departmental dose-optimization program which includes automated exposure control, adjustment of the mA and/or kV according to patient size and/or use of iterative reconstruction technique.   COMPARISON:  11/27/2023.    FINDINGS: Cardiovascular: Atherosclerotic calcification of the aorta and coronary arteries. Heart is at the upper limits of normal in size to mildly enlarged. No pericardial effusion.   Mediastinum/Nodes: No pathologically enlarged mediastinal or axillary lymph nodes. Hilar regions are difficult to definitively evaluate without IV contrast. Esophagus is grossly unremarkable.   Lungs/Pleura: Peripheral and somewhat basilar predominant coarsened linear  ground-glass and linear scarring, unchanged from 11/27/2023. Scattered tiny juxtapleural nodules are considered benign. 6 mm anterior segment left upper lobe nodule (6/49) and a minimally thick-walled atypical pulmonary cyst in the adjacent anterior segment left upper lobe (6/46), stable. No pleural fluid. Airway is unremarkable. Minimal air trapping.   Upper Abdomen: Small left adrenal adenoma. No specific follow-up necessary. Visualized portions of the liver, adrenal glands, left kidney, spleen, pancreas, stomach and bowel are otherwise grossly unremarkable. No upper abdominal adenopathy.   Musculoskeletal: Degenerative changes in the spine.   IMPRESSION: 1. 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 2. Left upper lobe nodules, as detailed above, stable. Recommend follow-up CT chest without contrast in 1 year. This recommendation follows the consensus statement: Guidelines for Management of Small Pulmonary Nodules Detected on CT Images: From the Fleischner Society 2017; Radiology 2017; 284:228-243. 3. Left adrenal adenoma. 4. Aortic atherosclerosis (ICD10-I70.0). Coronary artery calcification.     Electronically Signed   By: Shearon Denis M.D.   On: 05/30/2024 09:52    PFT     Latest Ref Rng & Units 03/23/2021   10:04 AM  PFT Results  FVC-Pre L 2.24   FVC-Predicted Pre % 74   FVC-Post L 2.32   FVC-Predicted Post % 77    Pre FEV1/FVC % % 85   Post FEV1/FCV % % 87   FEV1-Pre L 1.92   FEV1-Predicted Pre % 84   FEV1-Post L 2.01   DLCO uncorrected ml/min/mmHg 11.75   DLCO UNC% % 60   DLCO corrected ml/min/mmHg 11.75   DLCO COR %Predicted % 60   DLVA Predicted % 90   TLC L 3.31   TLC % Predicted % 65   RV % Predicted % 46        LAB RESULTS last 96 hours No results found.  MPRESSIONS  ECHO    1. Left ventricular ejection fraction, by estimation, is 65 to 70%. The  left ventricle has normal function. The left ventricle has no regional  wall motion abnormalities. Left ventricular diastolic parameters are  consistent with Grade II diastolic  dysfunction (pseudonormalization). Elevated left ventricular end-diastolic  pressure. The average left ventricular global longitudinal strain is -16.1  %. The global longitudinal strain is normal.   2. Right ventricular systolic function is normal. The right ventricular  size is normal.   3. The mitral valve is abnormal. Mild mitral valve regurgitation. No  evidence of mitral stenosis.   4. The aortic valve is tricuspid. Aortic valve regurgitation is not  visualized. No aortic stenosis is present.   5. The inferior vena cava is normal in size with greater than 50%  respiratory variability, suggesting right atrial pressure of 3 mmHg.       has a past medical history of Anemia, Arthritis, Asthma, Chronic back pain, Chronic kidney disease, COPD (chronic obstructive pulmonary disease) (HCC), Depression, GERD (gastroesophageal reflux disease), Heart murmur, Hypertension, Hypothyroidism, Neuromuscular disorder (HCC), Pneumonia, Stroke (HCC), and Thyroid  disease.   reports that she has quit smoking. Her smoking use included cigarettes. She has a 10 pack-year smoking history. She has never used smokeless tobacco.  Past Surgical History:  Procedure Laterality Date   ABDOMINAL HYSTERECTOMY     CATARACT EXTRACTION W/ INTRAOCULAR LENS IMPLANT Left 07/14/2014    CATARACT EXTRACTION W/ INTRAOCULAR LENS IMPLANT Right 10/18/2015   CHOLECYSTECTOMY N/A 12/06/2023   Procedure: LAPAROSCOPIC CHOLECYSTECTOMY;  Surgeon: Caralyn Chandler, MD;  Location: [facility];  Service: General;  Laterality: N/A;   COLONOSCOPY     IR RADIOLOGIST EVAL & MGMT  01/08/2020   LAPAROSCOPIC APPENDECTOMY N/A 01/29/2020   Procedure: LAPAROSCOPIC  APPENDECTOMY;  Surgeon: Enid Harry, MD;  Location: [facility] OR;  Service: General;  Laterality: N/A;   MULTIPLE TOOTH EXTRACTIONS  2002   NECK SURGERY  2001   ACDF C4-5, C5-6   by Dr. Jinny Mounts SURGERY  11/13/2007   PLIF  L4-5, L5-S1   by Dr. Jone Neither TOOTH EXTRACTION     per pt 01/21/20    No Known Allergies  Immunization History  Administered Date(s) Administered   Fluad Quad(high Dose 65+) 11/22/2016, 11/23/2017   Fluad Trivalent(High Dose 65+) 10/16/2023   Influenza Split 12/25/2006, 11/08/2010, 01/20/2013   Influenza, High Dose Seasonal PF 12/08/2018   Influenza, Quadrivalent, Recombinant, Inj, Pf 11/27/2013   Influenza, Seasonal, Injecte, Preservative Fre 10/23/2014, 11/15/2015   Influenza,inj,Quad PF,6+ Mos 11/22/2016, 11/23/2017   Pneumococcal Conjugate-13 02/15/2016   Pneumococcal Polysaccharide-23 05/22/2017   Pneumococcal-Unspecified 12/25/2006   Td 12/26/2007    Family History  Problem Relation Age of Onset   Cancer Mother    Alcohol abuse Father    Cancer Father    Heart disease Father    Cancer Maternal Grandfather    Heart disease Maternal Grandfather    Heart disease Paternal Grandmother    Diabetes Paternal Grandmother    Diabetes Paternal Grandfather      Current Outpatient Medications:    acetaminophen  (TYLENOL ) 325 MG tablet, Take 1-2 tablets (325-650 mg total) by mouth every 4 (four) hours as needed for mild pain. (Patient taking differently: Take 650 mg by mouth every 4 (four) hours as needed for mild pain (pain score 1-3).), Disp: , Rfl:    albuterol  (VENTOLIN  HFA) 108 (90 Base) MCG/ACT  inhaler, Inhale 2 puffs into the lungs every 6 (six) hours as needed for shortness of breath or wheezing., Disp: 18 g, Rfl: 2   amLODipine  (NORVASC ) 5 MG tablet, Take 5 mg by mouth daily., Disp: , Rfl:    buPROPion  (WELLBUTRIN  XL) 150 MG 24 hr tablet, Take 150 mg by mouth daily. , Disp: , Rfl:    ibuprofen (ADVIL) 200 MG tablet, Take 400 mg by mouth every 6 (six) hours as needed for moderate pain (pain score 4-6)., Disp: , Rfl:    levothyroxine  (SYNTHROID , LEVOTHROID) 50 MCG tablet, Take 50 mcg by mouth daily before breakfast. , Disp: , Rfl:    senna (SENOKOT) 8.6 MG TABS tablet, Take 2 tablets by mouth at bedtime., Disp: , Rfl:    traZODone  (DESYREL ) 100 MG tablet, Take 100 mg by mouth at bedtime as needed for sleep. , Disp: , Rfl:    umeclidinium bromide  (INCRUSE ELLIPTA ) 62.5 MCG/INH AEPB, Inhale 1 puff into the lungs daily., Disp: 1 each, Rfl: 0   valACYclovir (VALTREX) 500 MG tablet, Take 500 mg by mouth See admin instructions. Take 1 tablet by mouth twice daily for 3 days during outbreak, Disp: , Rfl:       Objective:   Vitals:   06/10/24 1435 06/10/24 1436  BP:  (!) 114/58  Pulse: 86   SpO2: 97%   Weight:  150 lb (68 kg)  Height:  5' 4 (1.626 m)    Estimated body mass index is 25.75 kg/m as calculated from the following:   Height as of this encounter: 5' 4 (1.626 m).   Weight as of this encounter: 150 lb (68 kg).  @WEIGHTCHANGE @  Filed Weights   06/10/24 1436  Weight: 150 lb (68 kg)     Physical Exam   General: No distress. Looks well O2 at rest: no Cane present: no Sitting in wheel chair: no Frail: no Obese: no Neuro: Alert and Oriented x 3. GCS 15. Speech normal Psych: Pleasant Resp:  Barrel Chest - no.  Wheeze - no, Crackles - no, No overt respiratory distress CVS: Normal heart sounds. Murmurs - no Ext: Stigmata of Connective Tissue Disease - no HEENT: Normal upper airway. PEERL +. No post nasal drip        Assessment:       ICD-10-CM   1. ILD  (interstitial lung disease) (HCC)  J84.9 Ambulatory referral to Cardiology    Pulmonary function test    CT Chest High Resolution    2. History of COVID-19  Z86.16 Ambulatory referral to Cardiology    Pulmonary function test    CT Chest High Resolution    3. Nodule of upper lobe of left lung  R91.1 Ambulatory referral to Cardiology    Pulmonary function test    CT Chest High Resolution    4. Grade II diastolic dysfunction  I51.89 Ambulatory referral to Cardiology    Pulmonary function test    CT Chest High Resolution    5. Coronary artery calcification seen on CAT scan  I25.10 Ambulatory referral to Cardiology    Pulmonary function test    CT Chest High Resolution         Plan:     Patient Instructions     ICD-10-CM   1. Left upper lobe pulmonary nodule  R91.1     2. DOE (dyspnea on exertion)  R06.09     3. History of COVID-19  Z86.16     4. Former smoker  Z87.891     5. 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    J84.10       #Left upper lobe pulmonary nodule 6 mm December 2024  -This is stable in June 2025  Plan - Repeat CT scan of the chest [high-resolution] in June 2026;    #Interstitial lung disease post-COVID   -Stable on CT scan of the chest between December 2024 and June 2025 - Minimal symptom burden - Last pulmonary function test 2022 apologize we did not get a pulmonary function test scheduled at this visit]  Plan - Do repeat pulmonary function test in 6 months - Initiate limited workup for the interstitial lung disease; get blood work for ANA, rheumatoid factor, CCP   #Grade 2 diastolic dysfunction on echocardiogram # Coronary artery calcification  -Worsening shortness of breath can definitely be because of worsening stiff heart muscle between 2019 and 2025   PLAN - Refer cardiology  Follow-up - 15-minute visit in 6 with nurse practitioner but after pulmonary function test  - Symptom score and exercise hypoxemia  test at follow-up  - To order CT scan of the chest at the time of follow-up   FOLLOWUP Return in about 8 weeks (around 08/05/2024) for with any of the APPS, after Spiro and DLCO.    SIGNATURE    Dr. Maire Scot, M.D., F.C.C.P,  Pulmonary and Critical Care Medicine Staff Physician, [facility] Director - Interstitial Lung Disease  Program  Pulmonary Fibrosis [facility] Network at [facility] [place], [place], [address]  Pager: [phone], If no answer or between  15:00h - 7:00h: call [phone] Telephone: [phone]  3:19 PM 06/10/2024

## 2024-06-10 NOTE — Addendum Note (Signed)
 Addended by: Maire Scot on: 06/10/2024 03:20 PM   Modules accepted: Orders

## 2024-06-12 LAB — ANA: Anti Nuclear Antibody (ANA): POSITIVE — AB

## 2024-06-12 LAB — ANTI-NUCLEAR AB-TITER (ANA TITER): ANA Titer 1: 1:80 {titer} — ABNORMAL HIGH

## 2024-06-12 LAB — RHEUMATOID FACTOR: Rheumatoid fact SerPl-aCnc: 10 [IU]/mL (ref <14)

## 2024-06-12 LAB — CYCLIC CITRUL PEPTIDE ANTIBODY, IGG: Cyclic Citrullin Peptide Ab: <16 U

## 2024-12-10 ENCOUNTER — Encounter: Payer: Self-pay | Admitting: Primary Care

## 2024-12-10 ENCOUNTER — Ambulatory Visit: Admitting: Primary Care

## 2024-12-10 ENCOUNTER — Ambulatory Visit (INDEPENDENT_AMBULATORY_CARE_PROVIDER_SITE_OTHER): Admitting: *Deleted

## 2024-12-10 VITALS — BP 126/70 | HR 76 | Temp 97.1°F | Ht 64.0 in | Wt 153.0 lb

## 2024-12-10 DIAGNOSIS — I5189 Other ill-defined heart diseases: Secondary | ICD-10-CM | POA: Diagnosis not present

## 2024-12-10 DIAGNOSIS — R7689 Other specified abnormal immunological findings in serum: Secondary | ICD-10-CM

## 2024-12-10 DIAGNOSIS — R911 Solitary pulmonary nodule: Secondary | ICD-10-CM

## 2024-12-10 DIAGNOSIS — J849 Interstitial pulmonary disease, unspecified: Secondary | ICD-10-CM

## 2024-12-10 DIAGNOSIS — J449 Chronic obstructive pulmonary disease, unspecified: Secondary | ICD-10-CM | POA: Diagnosis not present

## 2024-12-10 DIAGNOSIS — I2510 Atherosclerotic heart disease of native coronary artery without angina pectoris: Secondary | ICD-10-CM

## 2024-12-10 DIAGNOSIS — R0609 Other forms of dyspnea: Secondary | ICD-10-CM

## 2024-12-10 DIAGNOSIS — Z8616 Personal history of COVID-19: Secondary | ICD-10-CM

## 2024-12-10 DIAGNOSIS — M2550 Pain in unspecified joint: Secondary | ICD-10-CM

## 2024-12-10 LAB — PULMONARY FUNCTION TEST
DL/VA % pred: 99 %
DL/VA: 4.1 ml/min/mmHg/L
DLCO cor % pred: 73 %
DLCO cor: 14.18 ml/min/mmHg
DLCO unc % pred: 73 %
DLCO unc: 14.18 ml/min/mmHg
FEF 25-75 Pre: 1.79 L/s
FEF2575-%Pred-Pre: 104 %
FEV1-%Pred-Pre: 91 %
FEV1-Pre: 1.96 L
FEV1FVC-%Pred-Pre: 106 %
FEV6-%Pred-Pre: 89 %
FEV6-Pre: 2.44 L
FEV6FVC-%Pred-Pre: 105 %
FVC-%Pred-Pre: 85 %
FVC-Pre: 2.44 L
Pre FEV1/FVC ratio: 80 %
Pre FEV6/FVC Ratio: 100 %

## 2024-12-10 MED ORDER — INCRUSE ELLIPTA 62.5 MCG/ACT IN AEPB: 1.0000 | INHALATION_SPRAY | Freq: Every day | RESPIRATORY_TRACT | 11 refills | Status: AC
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
## 2024-12-10 NOTE — Patient Instructions (Addendum)
°  VISIT SUMMARY: Today, you had a follow-up visit to review your conditions, including COPD, post-COVID interstitial lung disease, and other health concerns. We discussed your current symptoms, recent test results, and future plans for monitoring and managing your health.  YOUR PLAN: -POST-COVID INTERSTITIAL LUNG DISEASE WITH PULMONARY FIBROSIS: This condition involves scarring and inflammation of the lungs following a COVID-19 infection. Your symptoms are well-managed, and recent tests show improvement in lung function. We will repeat a CT scan in June 2026 to monitor your lung condition.  -CHRONIC OBSTRUCTIVE PULMONARY DISEASE (COPD): COPD is a chronic lung disease that makes it hard to breathe. Your condition is managed with Incruse Ellipta , and we have refilled your prescription.  -PULMONARY NODULE, LEFT UPPER LOBE: A small growth in your left upper lung was found on previous imaging. While it is not currently a concern, we will monitor it with a repeat CT scan in June 2026.  -GRADE II DIASTOLIC DYSFUNCTION: This condition involves the stiffening of the heart's left ventricle, which can cause shortness of breath during physical activity. We have referred you to cardiology for further evaluation and management.  -ARTHRALGIA WITH POSITIVE ANA: Arthralgia means joint pain, and a positive ANA test can indicate an autoimmune condition. Given your family history and symptoms, we have referred you to rheumatology for further evaluation.  INSTRUCTIONS: Please schedule a CT scan for June 2026 to monitor your pulmonary nodule and lung condition. Additionally, follow up with cardiology for your diastolic dysfunction and with rheumatology for your joint pain and positive ANA test. Continue using Incruse Ellipta  as prescribed for your COPD.  Follow-up July after CT chest with Dr. Geronimo

## 2024-12-10 NOTE — Patient Instructions (Signed)
 Spirometry and diffusion capacity performed today.

## 2024-12-10 NOTE — Progress Notes (Signed)
 Spirometry and diffusion capacity performed today.

## 2024-12-17 ENCOUNTER — Ambulatory Visit: Payer: Self-pay | Admitting: Internal Medicine

## 2024-12-17 NOTE — Progress Notes (Signed)
 Trace positive ANA. YOu appear to have a rheumatolgy appt feb 2026. Please do keep that Thanks

## 2025-03-31 ENCOUNTER — Ambulatory Visit: Admitting: Internal Medicine
